# Patient Record
Sex: Female | Born: 1940 | Race: Black or African American | Hispanic: No | Smoking: Former smoker
Health system: Southern US, Community
[De-identification: ages and names within clinical notes are randomized; demographics above are authoritative.]

## PROBLEM LIST (undated history)

## (undated) DIAGNOSIS — I639 Cerebral infarction, unspecified: Secondary | ICD-10-CM

## (undated) DIAGNOSIS — I10 Essential (primary) hypertension: Secondary | ICD-10-CM

## (undated) DIAGNOSIS — R0600 Dyspnea, unspecified: Secondary | ICD-10-CM

## (undated) DIAGNOSIS — T8859XA Other complications of anesthesia, initial encounter: Secondary | ICD-10-CM

## (undated) DIAGNOSIS — J45909 Unspecified asthma, uncomplicated: Secondary | ICD-10-CM

## (undated) HISTORY — PX: BREAST SURGERY: SHX581

---

## 1999-10-11 ENCOUNTER — Encounter: Payer: Self-pay | Admitting: Emergency Medicine

## 1999-10-11 ENCOUNTER — Emergency Department (HOSPITAL_COMMUNITY): Admission: EM | Admit: 1999-10-11 | Discharge: 1999-10-11 | Payer: Self-pay | Admitting: Emergency Medicine

## 2000-03-03 ENCOUNTER — Other Ambulatory Visit: Admission: RE | Admit: 2000-03-03 | Discharge: 2000-03-03 | Payer: Self-pay | Admitting: Radiology

## 2000-06-16 ENCOUNTER — Emergency Department (HOSPITAL_COMMUNITY): Admission: EM | Admit: 2000-06-16 | Discharge: 2000-06-16 | Payer: Self-pay | Admitting: Emergency Medicine

## 2000-06-16 ENCOUNTER — Encounter: Payer: Self-pay | Admitting: Emergency Medicine

## 2002-02-18 ENCOUNTER — Encounter: Admission: RE | Admit: 2002-02-18 | Discharge: 2002-02-18 | Payer: Self-pay | Admitting: Internal Medicine

## 2002-02-21 ENCOUNTER — Encounter: Admission: RE | Admit: 2002-02-21 | Discharge: 2002-02-21 | Payer: Self-pay | Admitting: Internal Medicine

## 2002-03-25 ENCOUNTER — Encounter: Payer: Self-pay | Admitting: Internal Medicine

## 2002-03-25 ENCOUNTER — Ambulatory Visit (HOSPITAL_COMMUNITY): Admission: RE | Admit: 2002-03-25 | Discharge: 2002-03-25 | Payer: Self-pay | Admitting: Internal Medicine

## 2004-08-01 ENCOUNTER — Emergency Department (HOSPITAL_COMMUNITY): Admission: EM | Admit: 2004-08-01 | Discharge: 2004-08-01 | Payer: Self-pay | Admitting: Family Medicine

## 2006-10-25 ENCOUNTER — Emergency Department (HOSPITAL_COMMUNITY): Admission: EM | Admit: 2006-10-25 | Discharge: 2006-10-25 | Payer: Self-pay | Admitting: Emergency Medicine

## 2011-04-04 ENCOUNTER — Other Ambulatory Visit: Payer: Self-pay | Admitting: Family Medicine

## 2011-04-04 ENCOUNTER — Ambulatory Visit
Admission: RE | Admit: 2011-04-04 | Discharge: 2011-04-04 | Disposition: A | Discharge status: Home / Self Care | Payer: Medicare HMO | Source: Ambulatory Visit | Attending: Family Medicine | Admitting: Family Medicine

## 2011-04-04 DIAGNOSIS — R531 Weakness: Secondary | ICD-10-CM

## 2011-04-04 DIAGNOSIS — R200 Anesthesia of skin: Secondary | ICD-10-CM

## 2011-06-05 ENCOUNTER — Other Ambulatory Visit: Payer: Self-pay | Admitting: Family Medicine

## 2011-06-05 DIAGNOSIS — M25511 Pain in right shoulder: Secondary | ICD-10-CM

## 2011-06-11 ENCOUNTER — Ambulatory Visit
Admission: RE | Admit: 2011-06-11 | Discharge: 2011-06-11 | Disposition: A | Discharge status: Home / Self Care | Payer: Medicare HMO | Source: Ambulatory Visit | Attending: Family Medicine | Admitting: Family Medicine

## 2011-06-11 DIAGNOSIS — M25511 Pain in right shoulder: Secondary | ICD-10-CM

## 2012-08-10 ENCOUNTER — Emergency Department (HOSPITAL_COMMUNITY): Payer: Medicare HMO

## 2012-08-10 ENCOUNTER — Observation Stay (HOSPITAL_COMMUNITY)
Admission: EM | Admit: 2012-08-10 | Discharge: 2012-08-11 | Disposition: A | Discharge status: Home / Self Care | Payer: Medicare HMO | Attending: Family Medicine | Admitting: Family Medicine

## 2012-08-10 ENCOUNTER — Encounter (HOSPITAL_COMMUNITY): Payer: Self-pay | Admitting: *Deleted

## 2012-08-10 DIAGNOSIS — E876 Hypokalemia: Secondary | ICD-10-CM | POA: Insufficient documentation

## 2012-08-10 DIAGNOSIS — I714 Abdominal aortic aneurysm, without rupture, unspecified: Secondary | ICD-10-CM

## 2012-08-10 DIAGNOSIS — R079 Chest pain, unspecified: Principal | ICD-10-CM

## 2012-08-10 DIAGNOSIS — I517 Cardiomegaly: Secondary | ICD-10-CM | POA: Insufficient documentation

## 2012-08-10 DIAGNOSIS — R05 Cough: Secondary | ICD-10-CM | POA: Insufficient documentation

## 2012-08-10 DIAGNOSIS — R0602 Shortness of breath: Secondary | ICD-10-CM | POA: Insufficient documentation

## 2012-08-10 DIAGNOSIS — Z8673 Personal history of transient ischemic attack (TIA), and cerebral infarction without residual deficits: Secondary | ICD-10-CM | POA: Insufficient documentation

## 2012-08-10 DIAGNOSIS — F411 Generalized anxiety disorder: Secondary | ICD-10-CM

## 2012-08-10 DIAGNOSIS — Z23 Encounter for immunization: Secondary | ICD-10-CM | POA: Insufficient documentation

## 2012-08-10 DIAGNOSIS — K219 Gastro-esophageal reflux disease without esophagitis: Secondary | ICD-10-CM

## 2012-08-10 DIAGNOSIS — I10 Essential (primary) hypertension: Secondary | ICD-10-CM | POA: Insufficient documentation

## 2012-08-10 DIAGNOSIS — R059 Cough, unspecified: Secondary | ICD-10-CM | POA: Insufficient documentation

## 2012-08-10 DIAGNOSIS — D509 Iron deficiency anemia, unspecified: Secondary | ICD-10-CM | POA: Insufficient documentation

## 2012-08-10 HISTORY — DX: Essential (primary) hypertension: I10

## 2012-08-10 HISTORY — DX: Cerebral infarction, unspecified: I63.9

## 2012-08-10 LAB — CBC WITH DIFFERENTIAL/PLATELET
Basophils Relative: 1 % (ref 0–1)
Eosinophils Absolute: 0.2 10*3/uL (ref 0.0–0.7)
HCT: 32.7 % — ABNORMAL LOW (ref 36.0–46.0)
Hemoglobin: 10.8 g/dL — ABNORMAL LOW (ref 12.0–15.0)
MCH: 25.7 pg — ABNORMAL LOW (ref 26.0–34.0)
MCHC: 33 g/dL (ref 30.0–36.0)
Monocytes Absolute: 0.5 10*3/uL (ref 0.1–1.0)
Monocytes Relative: 6 % (ref 3–12)
Neutro Abs: 4.4 10*3/uL (ref 1.7–7.7)

## 2012-08-10 LAB — COMPREHENSIVE METABOLIC PANEL
Albumin: 3.6 g/dL (ref 3.5–5.2)
BUN: 19 mg/dL (ref 6–23)
Chloride: 101 mEq/L (ref 96–112)
Creatinine, Ser: 0.92 mg/dL (ref 0.50–1.10)
GFR calc Af Amer: 70 mL/min — ABNORMAL LOW (ref >90)
GFR calc non Af Amer: 60 mL/min — ABNORMAL LOW (ref >90)
Glucose, Bld: 99 mg/dL (ref 70–99)
Total Bilirubin: 0.3 mg/dL (ref 0.3–1.2)

## 2012-08-10 LAB — TROPONIN I: Troponin I: <0.3 ng/mL (ref <0.30)

## 2012-08-10 LAB — PRO B NATRIURETIC PEPTIDE: Pro B Natriuretic peptide (BNP): 29.4 pg/mL (ref 0–125)

## 2012-08-10 MED ORDER — NITROGLYCERIN 0.4 MG SL SUBL
0.4000 mg | SUBLINGUAL_TABLET | Freq: Once | SUBLINGUAL | Status: AC
  Administered 2012-08-10: 0.4 mg via SUBLINGUAL
  Filled 2012-08-10: qty 25

## 2012-08-10 MED ORDER — ASPIRIN 81 MG PO CHEW
324.0000 mg | CHEWABLE_TABLET | Freq: Once | ORAL | Status: AC
  Administered 2012-08-10: 324 mg via ORAL
  Filled 2012-08-10: qty 4

## 2012-08-10 MED ORDER — SODIUM CHLORIDE 0.9 % IV SOLN
INTRAVENOUS | Status: AC
  Administered 2012-08-11: via INTRAVENOUS

## 2012-08-10 NOTE — ED Notes (Signed)
Pt states that she began to feel short of breath around 4pm and that is has gotten progressively worse since then; pt denies chest pain; pt states that she feels like she is not getting enough air.

## 2012-08-10 NOTE — ED Provider Notes (Signed)
History     CSN: [id number]  Arrival date & time 08/10/12  2110   First MD Initiated Contact with Patient 08/10/12 2148      Chief Complaint  Patient presents with  . Shortness of Breath    (Consider location/radiation/quality/duration/timing/severity/associated sxs/prior treatment) HPI Comments: Patient comes to the ER for evaluation of chest discomfort and shortness of breath. Symptoms began this afternoon. Patient was at rest when symptoms began. She describes a burning sensation in the Center of her chest to radiate up towards the throat. She has felt short of breath with it. She reports that she did not take any medications. Was present for a couple of hours when it started to get worse and then she called her daughter. Was brought to the ER for further evaluation. At time of arrival symptoms have significantly improved although she has slight discomfort in the center of the chest. She has never had a heart disease but does have a history of stroke. There is family history of heart disease as well.  Patient is a 72 y.o. female presenting with shortness of breath.  Shortness of Breath Associated symptoms: chest pain     Past Medical History  Diagnosis Date  . Stroke   . Hypertension     History reviewed. No pertinent past surgical history.  No family history on file.  History  Substance Use Topics  . Smoking status: Former Games developer  . Smokeless tobacco: Not on file  . Alcohol Use: No    OB History   Grav Para Term Preterm Abortions TAB SAB Ect Mult Living                  Review of Systems  Respiratory: Positive for shortness of breath.   Cardiovascular: Positive for chest pain.  All other systems reviewed and are negative.    Allergies  Review of patient's allergies indicates no known allergies.  Home Medications  No current outpatient prescriptions on file.  BP 119/74  Pulse 87  Temp(Src) 98.7 F (37.1 C) (Oral)  Resp 18  Ht 5' (1.524 m)  Wt 121 lb  (54.885 kg)  BMI 23.63 kg/m2  SpO2 99%  Physical Exam  Constitutional: She is oriented to person, place, and time. She appears well-developed and well-nourished. No distress.  HENT:  Head: Normocephalic and atraumatic.  Right Ear: Hearing normal.  Nose: Nose normal.  Mouth/Throat: Oropharynx is clear and moist and mucous membranes are normal.  Eyes: Conjunctivae and EOM are normal. Pupils are equal, round, and reactive to light.  Neck: Normal range of motion. Neck supple.  Cardiovascular: Normal rate, regular rhythm, S1 normal and S2 normal.  Exam reveals no gallop and no friction rub.   No murmur heard. Pulmonary/Chest: Effort normal and breath sounds normal. No respiratory distress. She exhibits no tenderness.  Abdominal: Soft. Normal appearance and bowel sounds are normal. There is no hepatosplenomegaly. There is no tenderness. There is no rebound, no guarding, no tenderness at McBurney's point and negative Murphy's sign. No hernia.  Musculoskeletal: Normal range of motion.  Neurological: She is alert and oriented to person, place, and time. She has normal strength. No cranial nerve deficit or sensory deficit. Coordination normal. GCS eye subscore is 4. GCS verbal subscore is 5. GCS motor subscore is 6.  Skin: Skin is warm, dry and intact. No rash noted. No cyanosis.  Psychiatric: She has a normal mood and affect. Her speech is normal and behavior is normal. Thought content normal.  ED Course  Procedures (including critical care time)  EKG:  Date: 08/10/2012  Rate: 78  Rhythm: normal sinus rhythm  QRS Axis: normal  Intervals: normal  ST/T Wave abnormalities: normal  Conduction Disutrbances: none  Narrative Interpretation: unremarkable      Labs Reviewed  CBC WITH DIFFERENTIAL - Abnormal; Notable for the following:    Hemoglobin 10.8 (*)    HCT 32.7 (*)    MCV 77.9 (*)    MCH 25.7 (*)    RDW 15.7 (*)    All other components within normal limits  COMPREHENSIVE  METABOLIC PANEL - Abnormal; Notable for the following:    Potassium 3.0 (*)    GFR calc non Af Amer 60 (*)    GFR calc Af Amer 70 (*)    All other components within normal limits  TROPONIN I  PRO B NATRIURETIC PEPTIDE   Dg Chest 2 View  08/10/2012  *RADIOLOGY REPORT*  Clinical Data: Chest pain.  Cough and shortness of breath for 1 week.  CHEST - 2 VIEW  Comparison: None.  Findings: Lateral view degraded by patient arm position.  Midline trachea.  Mild cardiomegaly.  Borderline right paratracheal soft tissue fullness is favored to be due to great vessels.  No pleural effusion or pneumothorax.  No congestive failure.  Clear lungs.  IMPRESSION: Cardiomegaly, without acute disease.   Original Report Authenticated By: Jeronimo Greaves, M.D.      Diagnosis: Chest pain    MDM  This presents to the ER for evaluation of chest pain and shortness of breath. Symptoms began earlier today while at rest. She did not have any exertional pain. She thought it was indigestion at first. At arrival symptoms were improving but still present. Symptoms completely resolved after aspirin and one nitroglycerin sublingual. Patient does have a personal history of vascular disease (CVA) and a family history of heart disease. Her EKG was unremarkable and troponin is negative. Patient will require further cardiac rule out. To be placed on telemetry observation the hospitalist.       Gilda Crease, MD 08/10/12 2340

## 2012-08-10 NOTE — ED Notes (Signed)
Pt O2 sat on RA is 95% but pt reports breathing better with O2 on.  Pt put on 1L [place].

## 2012-08-10 NOTE — ED Notes (Addendum)
Pt reports feeling "lighter" and better.

## 2012-08-11 ENCOUNTER — Encounter (HOSPITAL_COMMUNITY): Payer: Self-pay | Admitting: *Deleted

## 2012-08-11 DIAGNOSIS — F411 Generalized anxiety disorder: Secondary | ICD-10-CM

## 2012-08-11 DIAGNOSIS — K219 Gastro-esophageal reflux disease without esophagitis: Secondary | ICD-10-CM

## 2012-08-11 DIAGNOSIS — R079 Chest pain, unspecified: Secondary | ICD-10-CM

## 2012-08-11 LAB — BASIC METABOLIC PANEL
BUN: 19 mg/dL (ref 6–23)
BUN: 15 mg/dL (ref 6–23)
CO2: 26 mEq/L (ref 19–32)
Chloride: 102 mEq/L (ref 96–112)
Creatinine, Ser: 0.87 mg/dL (ref 0.50–1.10)
GFR calc non Af Amer: 63 mL/min — ABNORMAL LOW (ref >90)
Glucose, Bld: 88 mg/dL (ref 70–99)
Potassium: 3.1 mEq/L — ABNORMAL LOW (ref 3.5–5.1)

## 2012-08-11 LAB — LIPID PANEL
Cholesterol: 201 mg/dL — ABNORMAL HIGH (ref 0–200)
Total CHOL/HDL Ratio: 4.7 RATIO
Triglycerides: 129 mg/dL (ref <150)
VLDL: 26 mg/dL (ref 0–40)

## 2012-08-11 LAB — HEMOGLOBIN A1C: Mean Plasma Glucose: 128 mg/dL — ABNORMAL HIGH (ref <117)

## 2012-08-11 MED ORDER — POTASSIUM CHLORIDE CRYS ER 20 MEQ PO TBCR
40.0000 meq | EXTENDED_RELEASE_TABLET | Freq: Four times a day (QID) | ORAL | Status: AC
Start: 2012-08-11 — End: 2012-08-11
  Administered 2012-08-11 (×3): 40 meq via ORAL
  Filled 2012-08-11 (×3): qty 2

## 2012-08-11 MED ORDER — PANTOPRAZOLE SODIUM 40 MG PO TBEC
40.0000 mg | DELAYED_RELEASE_TABLET | Freq: Every day | ORAL | Status: DC
  Administered 2012-08-11: 40 mg via ORAL
  Filled 2012-08-11: qty 1

## 2012-08-11 MED ORDER — SODIUM CHLORIDE 0.9 % IV SOLN: 250.0000 mL | INTRAVENOUS | Status: DC | PRN

## 2012-08-11 MED ORDER — HYDROCODONE-ACETAMINOPHEN 5-325 MG PO TABS
1.0000 | ORAL_TABLET | Freq: Four times a day (QID) | ORAL | Status: DC | PRN
  Administered 2012-08-11: 1 via ORAL
  Filled 2012-08-11: qty 1

## 2012-08-11 MED ORDER — ENOXAPARIN SODIUM 40 MG/0.4ML ~~LOC~~ SOLN
40.0000 mg | SUBCUTANEOUS | Status: DC
  Administered 2012-08-11: 40 mg via SUBCUTANEOUS
  Filled 2012-08-11: qty 0.4

## 2012-08-11 MED ORDER — GUAIFENESIN-DM 100-10 MG/5ML PO SYRP
5.0000 mL | ORAL_SOLUTION | ORAL | Status: DC | PRN
  Administered 2012-08-11: 5 mL via ORAL
  Filled 2012-08-11: qty 10

## 2012-08-11 MED ORDER — SODIUM CHLORIDE 0.9 % IJ SOLN: 3.0000 mL | Freq: Two times a day (BID) | INTRAMUSCULAR | Status: DC

## 2012-08-11 MED ORDER — ONDANSETRON HCL 4 MG PO TABS: 4.0000 mg | ORAL_TABLET | Freq: Four times a day (QID) | ORAL | Status: DC | PRN

## 2012-08-11 MED ORDER — ALUM & MAG HYDROXIDE-SIMETH 200-200-20 MG/5ML PO SUSP
30.0000 mL | Freq: Four times a day (QID) | ORAL | Status: DC | PRN
  Administered 2012-08-11: 30 mL via ORAL
  Filled 2012-08-11: qty 30

## 2012-08-11 MED ORDER — ONDANSETRON HCL 4 MG/2ML IJ SOLN: 4.0000 mg | Freq: Four times a day (QID) | INTRAMUSCULAR | Status: DC | PRN

## 2012-08-11 MED ORDER — MORPHINE SULFATE 2 MG/ML IJ SOLN: 2.0000 mg | INTRAMUSCULAR | Status: DC | PRN

## 2012-08-11 MED ORDER — PNEUMOCOCCAL VAC POLYVALENT 25 MCG/0.5ML IJ INJ
0.5000 mL | INJECTION | Freq: Once | INTRAMUSCULAR | Status: AC
  Administered 2012-08-11: 0.5 mL via INTRAMUSCULAR
  Filled 2012-08-11: qty 0.5

## 2012-08-11 MED ORDER — ASPIRIN EC 81 MG PO TBEC
81.0000 mg | DELAYED_RELEASE_TABLET | Freq: Every day | ORAL | Status: DC
  Administered 2012-08-11: 81 mg via ORAL
  Filled 2012-08-11: qty 1

## 2012-08-11 MED ORDER — ASPIRIN 81 MG PO TBEC: 81.0000 mg | DELAYED_RELEASE_TABLET | Freq: Every day | ORAL | Status: DC

## 2012-08-11 MED ORDER — PANTOPRAZOLE SODIUM 40 MG PO TBEC: 40.0000 mg | DELAYED_RELEASE_TABLET | Freq: Every day | ORAL | Status: DC

## 2012-08-11 MED ORDER — ALPRAZOLAM 0.25 MG PO TABS: 0.2500 mg | ORAL_TABLET | Freq: Three times a day (TID) | ORAL | Status: DC | PRN

## 2012-08-11 MED ORDER — ALBUTEROL SULFATE (5 MG/ML) 0.5% IN NEBU
2.5000 mg | INHALATION_SOLUTION | RESPIRATORY_TRACT | Status: DC | PRN
  Administered 2012-08-11: 2.5 mg via RESPIRATORY_TRACT
  Filled 2012-08-11: qty 0.5

## 2012-08-11 MED ORDER — ALPRAZOLAM 0.25 MG PO TABS
0.2500 mg | ORAL_TABLET | Freq: Three times a day (TID) | ORAL | Status: DC | PRN
  Administered 2012-08-11: 0.25 mg via ORAL
  Filled 2012-08-11: qty 1

## 2012-08-11 MED ORDER — SODIUM CHLORIDE 0.9 % IJ SOLN: 3.0000 mL | INTRAMUSCULAR | Status: DC | PRN

## 2012-08-11 NOTE — Discharge Summary (Addendum)
Physician Discharge Summary  Infant Doane Kassing UEA:[id number] DOB: November 15, 1940 DOA: 08/10/2012  PCP: No primary provider on file.  Admit date: 08/10/2012 Discharge date: 08/11/2012  Time spent: 50 minutes  Recommendations for Outpatient Follow-up:  1. [facility] cardiology will call you to set up stress test as outpatient  Discharge Diagnoses:  Chest pain GERD Anxiety  Disorder  Discharge Condition: Stable  Diet recommendation: low salt diet  Filed Weights   08/10/12 2121 08/11/12 0117  Weight: 54.885 kg (121 lb) 58 kg (127 lb 13.9 oz)    History of present illness:  72 y.o. female with PMHx significant for HTN, stroke presents to [facility] for evaluation of acute onset chest pain/shortness of breath that began on the day of admission while the patient was resting at about 5 PM. Pain is described as a burning chest pain in the center of the chest , radiating up towards throat. The pain is rated as a 3/10 in severity at onset and currently rated 0/10. The pain is notably aggravated by coughing. It is alleviated by none. has associated near syncope and shortness of breath. For the pain, the patient tried nothing for pain relief.  Patient is able to walk couple of miles without any difficulty and had actually walked this morning without any aggravation in pain or shortness of breath.      Hospital Course:   Chest pain Resolved Most likely due to GERD or panic attack We have called cardiology for outpatient stress test  GERD Will send her on Po protonix  Anxiety/panic disorder Xanax 0.25 mg po q 8 hr prn Will follow up with PCP to consider SSRI  HTN Continue Hyzaar  Hypokalemia Potassium replaced   Procedures:  None  Consultations:  None  Discharge Exam: Filed Vitals:   08/11/12 0359 08/11/12 0528 08/11/12 1300 08/11/12 1355  BP:  113/63  125/78  Pulse:  69  80  Temp:  98.1 F (36.7 C) 98.1 F (36.7 C) 98.1 F (36.7 C)  TempSrc:  Oral  Oral  Resp:  18  18  Height:       Weight:      SpO2: 96% 97%  97%    General: appear in no acute distress Cardiovascular: S1s2 RRR Respiratory: Clear bilaterally  Discharge Instructions  Discharge Orders   Future Orders Complete By Expires     Diet - low sodium heart healthy  As directed     Increase activity slowly  As directed         Medication List    TAKE these medications       ALPRAZolam 0.25 MG tablet  Commonly known as:  XANAX  Take 1 tablet (0.25 mg total) by mouth 3 (three) times daily as needed for anxiety.     aspirin 81 MG EC tablet  Take 1 tablet (81 mg total) by mouth daily.     loratadine 10 MG tablet  Commonly known as:  CLARITIN  Take 10 mg by mouth daily.     losartan-hydrochlorothiazide 100-25 MG per tablet  Commonly known as:  HYZAAR  Take 1 tablet by mouth daily.     pantoprazole 40 MG tablet  Commonly known as:  PROTONIX  Take 1 tablet (40 mg total) by mouth daily.          The results of significant diagnostics from this hospitalization (including imaging, microbiology, ancillary and laboratory) are listed below for reference.    Significant Diagnostic Studies: Dg Chest 2 View  08/10/2012  *RADIOLOGY  REPORT*  Clinical Data: Chest pain.  Cough and shortness of breath for 1 week.  CHEST - 2 VIEW  Comparison: None.  Findings: Lateral view degraded by patient arm position.  Midline trachea.  Mild cardiomegaly.  Borderline right paratracheal soft tissue fullness is favored to be due to great vessels.  No pleural effusion or pneumothorax.  No congestive failure.  Clear lungs.  IMPRESSION: Cardiomegaly, without acute disease.   Original Report Authenticated By: Jeronimo Greaves, M.D.     Microbiology: No results found for this or any previous visit (from the past 240 hour(s)).   Labs: Basic Metabolic Panel:  Recent Labs Lab 08/10/12 2216 08/11/12 0307 08/11/12 1328  NA 138 138 137  K 3.0* 3.1* 4.0  CL 101 100 102  CO2 27 28 26   GLUCOSE 99 88 109*  BUN 19 19 15    CREATININE 0.92 0.89 0.87  CALCIUM 9.7 9.1 9.3  MG  --  2.1  --    Liver Function Tests:  Recent Labs Lab 08/10/12 2216  AST 18  ALT 19  ALKPHOS 79  BILITOT 0.3  PROT 7.6  ALBUMIN 3.6   No results found for this basename: LIPASE, AMYLASE,  in the last 168 hours No results found for this basename: AMMONIA,  in the last 168 hours CBC:  Recent Labs Lab 08/10/12 2216  WBC 8.3  NEUTROABS 4.4  HGB 10.8*  HCT 32.7*  MCV 77.9*  PLT 314   Cardiac Enzymes:  Recent Labs Lab 08/10/12 2216 08/11/12 0307 08/11/12 0855  TROPONINI <0.30 <0.30 <0.30   BNP: BNP (last 3 results)  Recent Labs  08/10/12 2216  PROBNP 29.4   CBG: No results found for this basename: GLUCAP,  in the last 168 hours     Signed:  Milagro Belmares S  [facility] 08/11/2012, 3:25 PM

## 2012-08-11 NOTE — Care Management Note (Addendum)
    Page 1 of 1   08/11/2012     3:53:43 PM   CARE MANAGEMENT NOTE 08/11/2012  Patient:  Nicole Rush, Nicole Rush   Account Number:  [id number]  Date Initiated:  08/11/2012  Documentation initiated by:  Lanier Clam  Subjective/Objective Assessment:   ADMITTED W/CHEST PAIN.HX:HTN.     Action/Plan:   FROM HOME W/SUPPORT.HAS PCP,PHARMACY.   Anticipated DC Date:  08/11/2012   Anticipated DC Plan:  HOME/SELF CARE      DC Planning Services  CM consult      Choice offered to / List presented to:             Status of service:  Completed, signed off Medicare Important Message given?   (If response is "NO", the following Medicare IM given date fields will be blank) Date Medicare IM given:   Date Additional Medicare IM given:    Discharge Disposition:  HOME/SELF CARE  Per UR Regulation:  Reviewed for med. necessity/level of care/duration of stay  If discussed at Long Length of Stay Meetings, dates discussed:    Comments:  08/11/12 Mariadelcarmen Corella RN,BSN NCM [phone] D/C HOME NO ORDERS OR NEEDS.

## 2012-08-11 NOTE — ED Notes (Signed)
Dr. Garg at bedside. 

## 2012-08-11 NOTE — H&P (Signed)
[facility]  Date: 08/11/2012               Patient Name:  Nicole Rush MRN: [id number]  DOB: 02-06-1941 Age / Sex: 72 y.o., female   PCP: No primary provider on file.              Chief Complaint: chest pain   History of Present Illness: Patient is a 72 y.o. female with PMHx significant for HTN, stroke presents to [facility] for evaluation of acute onset chest pain/shortness of breath that began on the day of admission while the patient was resting at about 5 PM. Pain is described as a burning chest pain in the center of the chest , radiating up towards throat. The pain is rated as a 3/10 in severity at onset and currently rated 0/10. The pain is notably aggravated by coughing. It is alleviated by none. has associated near syncope and shortness of breath. For the pain, the patient tried nothing for pain relief.  Patient is able to walk couple of miles without any difficulty and had actually walked this morning without any aggravation in pain or shortness of breath.   The patient has not experienced similar symptoms previously. Secondary to her constellation of symptoms, Nicole Rush presented to [facility] for further evaluation. Otherwise, the patient denies diaphoresis, nausea, near syncope and palpitations.   Current Outpatient Medications:  (Not in a hospital admission)  Allergies: No Known Allergies   Past Medical History: Past Medical History  Diagnosis Date  . Stroke   . Hypertension     Past Surgical History: History reviewed. No pertinent past surgical history.  Family History: No family history on file.  Social History: History   Social History  . Marital Status: Widowed    Spouse Name: N/A    Number of Children: N/A  . Years of Education: N/A   Occupational History  . Not on file.   Social History Main Topics  . Smoking status: Former Games developer  . Smokeless tobacco: Not on file  . Alcohol Use: No  . Drug Use: No  . Sexually Active: No   Other  Topics Concern  . Not on file   Social History Narrative  . No narrative on file    Review of Systems: Constitutional:  denies fever, chills, diaphoresis, appetite change and fatigue.  HEENT: denies photophobia, eye pain, redness, hearing loss, ear pain, congestion, sore throat, rhinorrhea, sneezing, neck pain, neck stiffness and tinnitus.  Respiratory: denies SOB, DOE, cough, chest tightness, and wheezing.  Cardiovascular: admits to chest pain,SOB. Denies palpitations and leg swelling.  Gastrointestinal: denies nausea, vomiting, abdominal pain, diarrhea, constipation, blood in stool.  Genitourinary: denies dysuria, urgency, frequency, hematuria, flank pain and difficulty urinating.  Musculoskeletal: denies  myalgias, back pain, joint swelling, arthralgias and gait problem.   Skin: denies pallor, rash and wound.  Neurological: denies dizziness, seizures, syncope, weakness, light-headedness, numbness and headaches.   Hematological: denies adenopathy, easy bruising, personal or family bleeding history.  Psychiatric/ Behavioral: denies suicidal ideation, mood changes, confusion, nervousness, sleep disturbance and agitation.    Vital Signs: Blood pressure 126/74, pulse 74, temperature 98.7 F (37.1 C), temperature source Oral, resp. rate 34, height 5' (1.524 m), weight 121 lb (54.885 kg), SpO2 100.00%.  Physical Exam: General: Vital signs reviewed and noted. Well-developed, well-nourished, in no acute distress; alert, appropriate and cooperative throughout examination.  Head: Normocephalic, atraumatic.  Eyes: PERRL, EOMI, No signs of anemia or jaundince.  Nose: Mucous membranes  moist, not inflammed, nonerythematous.  Throat: Oropharynx nonerythematous, no exudate appreciated.   Neck: No deformities, masses, or tenderness noted.Supple, No carotid Bruits, no JVD.  Lungs:  Normal respiratory effort. Clear to auscultation BL without crackles or wheezes.  Heart: RRR. S1 and S2 normal without  gallop, murmur, or rubs.  Abdomen:  BS normoactive. Soft, Nondistended, non-tender.  No masses or organomegaly.  Extremities: No pretibial edema.  Neurologic: A&O X3, CN II - XII are grossly intact. Motor strength is 5/5 in the all 4 extremities, Sensations intact to light touch, Cerebellar signs negative.  Skin: No visible rashes, scars.    Lab results: Basic Metabolic Panel:  Recent Labs  21/30/86 2216  NA 138  K 3.0*  CL 101  CO2 27  GLUCOSE 99  BUN 19  CREATININE 0.92  CALCIUM 9.7   Liver Function Tests:  Recent Labs  08/10/12 2216  AST 18  ALT 19  ALKPHOS 79  BILITOT 0.3  PROT 7.6  ALBUMIN 3.6     Recent Labs  08/10/12 2216  WBC 8.3  NEUTROABS 4.4  HGB 10.8*  HCT 32.7*  MCV 77.9*  PLT 314   Cardiac Enzymes:  Recent Labs  08/10/12 2216  TROPONINI <0.30   BNP:  Recent Labs  08/10/12 2216  PROBNP 29.4      Imaging results:  Dg Chest 2 View  08/10/2012  *RADIOLOGY REPORT*  Clinical Data: Chest pain.  Cough and shortness of breath for 1 week.  CHEST - 2 VIEW  Comparison: None.  Findings: Lateral view degraded by patient arm position.  Midline trachea.  Mild cardiomegaly.  Borderline right paratracheal soft tissue fullness is favored to be due to great vessels.  No pleural effusion or pneumothorax.  No congestive failure.  Clear lungs.  IMPRESSION: Cardiomegaly, without acute disease.   Original Report Authenticated By: Jeronimo Greaves, M.D.       Other results:  EKG (08/11/2012) - Normal Sinus Rhythm , regular rate of approximately 78 bpm, normal axis, ST segments: normal and nonspecific ST changes.    Assessment & Plan: Pt is a 72 y.o. yo female with a PMHX of HTN and stroke , who presented to [facility] on 08/10/2012 with symptoms of chest pain  # Atypical Chest pain - Patient does not have known history of coronary artery disease. She also has cardiac risk factors including the following: hypertension. Admission EKG showed non specific ST changes,  and cardiac enzymes have been negative . Her chest pain is mostly likely related to GERD but given her age and risk factors including HTN, Stroke, would admit her to rule out ACS. Chest xray is negative for any acute pathology  Admit to telemetry  Continue to cycle cardiac enzymes.  Morphine, oxygen and PRN nitroglycerine  Check HgA1c, FLP for risk factor stratification.   Protonix and Maalox for indigestion   Patient will likely need a stress test to rule out CAD and assess exercise tolerance for prognosis.  # Hypokalemia: Check Mg. Replete  # Microcytic anemia: H and H stable.     DVT PPX - low molecular weight heparin  CODE STATUS - Full  CONSULTS PLACED - None  COMMUNICATION: Patient and family updated on bedside.      Signed: Lars Mage, MD  08/11/2012, 12:12 AM

## 2012-08-11 NOTE — Progress Notes (Signed)
Patient discharged home with daughter, discharge instructions given and explained to patient/daughter and they verbalized understanding, denies any pain/distress. Skin intact, no wound. Patient accompanied home by daughter.

## 2013-03-13 ENCOUNTER — Encounter (HOSPITAL_COMMUNITY): Payer: Self-pay | Admitting: Emergency Medicine

## 2013-03-13 ENCOUNTER — Emergency Department (HOSPITAL_COMMUNITY)
Admission: EM | Admit: 2013-03-13 | Discharge: 2013-03-13 | Disposition: A | Discharge status: Home / Self Care | Payer: Medicare HMO | Attending: Emergency Medicine | Admitting: Emergency Medicine

## 2013-03-13 ENCOUNTER — Emergency Department (HOSPITAL_COMMUNITY): Payer: Medicare HMO

## 2013-03-13 DIAGNOSIS — S76311A Strain of muscle, fascia and tendon of the posterior muscle group at thigh level, right thigh, initial encounter: Secondary | ICD-10-CM

## 2013-03-13 DIAGNOSIS — I10 Essential (primary) hypertension: Secondary | ICD-10-CM | POA: Insufficient documentation

## 2013-03-13 DIAGNOSIS — Y929 Unspecified place or not applicable: Secondary | ICD-10-CM | POA: Insufficient documentation

## 2013-03-13 DIAGNOSIS — Z79899 Other long term (current) drug therapy: Secondary | ICD-10-CM | POA: Insufficient documentation

## 2013-03-13 DIAGNOSIS — IMO0002 Reserved for concepts with insufficient information to code with codable children: Secondary | ICD-10-CM | POA: Insufficient documentation

## 2013-03-13 DIAGNOSIS — Z87891 Personal history of nicotine dependence: Secondary | ICD-10-CM | POA: Insufficient documentation

## 2013-03-13 DIAGNOSIS — X58XXXA Exposure to other specified factors, initial encounter: Secondary | ICD-10-CM | POA: Insufficient documentation

## 2013-03-13 DIAGNOSIS — M79604 Pain in right leg: Secondary | ICD-10-CM

## 2013-03-13 DIAGNOSIS — M79609 Pain in unspecified limb: Secondary | ICD-10-CM

## 2013-03-13 DIAGNOSIS — J45909 Unspecified asthma, uncomplicated: Secondary | ICD-10-CM | POA: Insufficient documentation

## 2013-03-13 DIAGNOSIS — Y9389 Activity, other specified: Secondary | ICD-10-CM | POA: Insufficient documentation

## 2013-03-13 HISTORY — DX: Unspecified asthma, uncomplicated: J45.909

## 2013-03-13 MED ORDER — ACETAMINOPHEN 325 MG PO TABS
650.0000 mg | ORAL_TABLET | Freq: Once | ORAL | Status: AC
Start: 2013-03-13 — End: 2013-03-13
  Administered 2013-03-13: 650 mg via ORAL
  Filled 2013-03-13: qty 2

## 2013-03-13 NOTE — Progress Notes (Signed)
VASCULAR LAB PRELIMINARY  PRELIMINARY  PRELIMINARY  PRELIMINARY  Right lower extremity venous duplex completed.    Preliminary report:  Right:  No evidence of DVT, superficial thrombosis, or Baker's cyst.  Nashika Coker, RVT 03/13/2013, 3:59 PM

## 2013-03-13 NOTE — ED Provider Notes (Signed)
See prior note   Ward Givens, MD 03/13/13 [phone]

## 2013-03-13 NOTE — ED Provider Notes (Signed)
Patient reports she walks a large pitbull that tracks her around. She does not remember a specific injury however a month ago started having pain in her right leg that is in her thigh down to her ankle. She states the worst pain however is centered behind her right knee and indicates an area about 6 inches above the knee and 6 inches below the knee. She states that she quit walking the golf 2 weeks ago however she continues to have discomfort. She states when she walks her right knee will give out on her. She has not had any fall or injury from that. She states she's never seen an orthopedist before.  Patient has good range of motion of her right hip, knee, and ankle. There is no obvious swelling or deformity. She has some mild diffuse tenderness over the hamstring area and also the medial thigh.  Medical screening examination/treatment/procedure(s) were conducted as a shared visit with non-physician practitioner(s) and myself.  I personally evaluated the patient during the encounter.  EKG Interpretation   None       Devoria Albe, MD, Armando Gang   Ward Givens, MD 03/13/13 [phone]

## 2013-03-13 NOTE — ED Notes (Signed)
She states that for the past month, her right foot and inner ankle have "been 'burning' a little bit".  She also tells me she walks "a lot every day to stay in shape".  She came here today because she states that for the past couple of days, her entire right leg has "started to hurt".  She denies any trauma, and is in no distress.

## 2013-03-13 NOTE — ED Provider Notes (Signed)
CSN: [id number]     Arrival date & time 03/13/13  1433 History   First MD Initiated Contact with Patient 03/13/13 1503     Chief Complaint  Patient presents with  . Leg Pain   (Consider location/radiation/quality/duration/timing/severity/associated sxs/prior Treatment) HPI Comments: Patient is a 72 year old female with a past medical history of stroke, hypertension and asthma who presents to the emergency department with her daughter complaining of right leg pain "for a while" worsening over the past 2 days. Pain begins in her lower leg, radiates up behind her knee and into her thigh to her hip. Denies back pain or injury. No known injury or trauma. Yesterday she felt a "pop" in her knee. Pain worse with walking, knee occasionally "gives out", minimally relieved by Advil. Denies swelling or color change. She has been walking a pitbull that ahs been pulling her around.  Patient is a 72 y.o. female presenting with leg pain. The history is provided by the patient and a relative.  Leg Pain   Past Medical History  Diagnosis Date  . Stroke   . Hypertension   . Asthma    History reviewed. No pertinent past surgical history. History reviewed. No pertinent family history. History  Substance Use Topics  . Smoking status: Former Games developer  . Smokeless tobacco: Not on file  . Alcohol Use: No   OB History   Grav Para Term Preterm Abortions TAB SAB Ect Mult Living                 Review of Systems  Musculoskeletal:       Positive for right leg pain.    Allergies  Review of patient's allergies indicates no known allergies.  Home Medications   Current Outpatient Rx  Name  Route  Sig  Dispense  Refill  . loratadine (CLARITIN) 10 MG tablet   Oral   Take 10 mg by mouth daily.         Marland Kitchen losartan-hydrochlorothiazide (HYZAAR) 100-25 MG per tablet   Oral   Take 1 tablet by mouth daily.          BP 130/77  Pulse 97  Temp(Src) 97.9 F (36.6 C) (Oral)  Resp 16  SpO2 98% Physical  Exam  Nursing note and vitals reviewed. Constitutional: She is oriented to person, place, and time. She appears well-developed and well-nourished. No distress.  HENT:  Head: Normocephalic and atraumatic.  Mouth/Throat: Oropharynx is clear and moist.  Eyes: Conjunctivae are normal.  Neck: Normal range of motion. Neck supple.  Cardiovascular: Normal rate, regular rhythm, normal heart sounds and intact distal pulses.   No extremity edema.  Pulmonary/Chest: Effort normal and breath sounds normal.  Musculoskeletal: Normal range of motion. She exhibits no edema.  Tender to palpation of the popliteal space on right up into the hamstrings. Full range of motion of ankle, knee and hip. No lumbar spine tenderness.  Neurological: She is alert and oriented to person, place, and time.  Skin: Skin is warm and dry. She is not diaphoretic.  Psychiatric: She has a normal mood and affect. Her behavior is normal.    ED Course  Procedures (including critical care time) Labs Review Labs Reviewed - No data to display Imaging Review No results found.  EKG Interpretation   None       MDM   1. Hamstring strain, right, initial encounter   2. Leg pain, right    Pt with leg pain, no swelling or erythema. Neurovascularly intact. X-ray of right  knee normal. Lower extremity duplex obtained to rule out DVT, negative for DVT. She is well appearing and in no apparent distress, normal vital signs. Probable hamstring pull as she has been out walking a pitbull, tender in that area. Will apply knee immobilizer, crutches. NSAIDs, elevation, f/u with ortho. Case discussed with attending Dr. Lynelle Doctor who also evaluated patient and agrees with plan of care.     Trevor Mace, PA-C 03/13/13 1623

## 2013-03-13 NOTE — ED Notes (Signed)
Right leg painful and numb yesterday with knee popping, today is much better, states still limping, increased pain at night

## 2015-07-09 ENCOUNTER — Ambulatory Visit
Admission: RE | Admit: 2015-07-09 | Discharge: 2015-07-09 | Disposition: A | Discharge status: Home / Self Care | Payer: Medicare HMO | Source: Ambulatory Visit | Attending: Family Medicine | Admitting: Family Medicine

## 2015-07-09 ENCOUNTER — Other Ambulatory Visit: Payer: Self-pay | Admitting: Family Medicine

## 2015-07-09 DIAGNOSIS — M25511 Pain in right shoulder: Secondary | ICD-10-CM

## 2018-05-13 ENCOUNTER — Other Ambulatory Visit: Payer: Self-pay | Admitting: Family Medicine

## 2018-05-13 ENCOUNTER — Ambulatory Visit
Admission: RE | Admit: 2018-05-13 | Discharge: 2018-05-13 | Disposition: A | Discharge status: Home / Self Care | Payer: Medicare HMO | Source: Ambulatory Visit | Attending: Family Medicine | Admitting: Family Medicine

## 2018-05-13 DIAGNOSIS — R52 Pain, unspecified: Secondary | ICD-10-CM

## 2018-10-25 ENCOUNTER — Other Ambulatory Visit: Payer: Self-pay | Admitting: Family Medicine

## 2018-10-25 ENCOUNTER — Ambulatory Visit
Admission: RE | Admit: 2018-10-25 | Discharge: 2018-10-25 | Disposition: A | Discharge status: Home / Self Care | Payer: Medicare HMO | Source: Ambulatory Visit | Attending: Family Medicine | Admitting: Family Medicine

## 2018-10-25 DIAGNOSIS — M545 Low back pain, unspecified: Secondary | ICD-10-CM

## 2019-03-03 ENCOUNTER — Other Ambulatory Visit: Payer: Self-pay | Admitting: Family Medicine

## 2019-03-03 DIAGNOSIS — N6320 Unspecified lump in the left breast, unspecified quadrant: Secondary | ICD-10-CM

## 2019-03-14 ENCOUNTER — Ambulatory Visit
Admission: RE | Admit: 2019-03-14 | Discharge: 2019-03-14 | Disposition: A | Discharge status: Home / Self Care | Payer: Medicare HMO | Source: Ambulatory Visit | Attending: Family Medicine | Admitting: Family Medicine

## 2019-03-14 ENCOUNTER — Other Ambulatory Visit: Payer: Self-pay | Admitting: Family Medicine

## 2019-03-14 DIAGNOSIS — N6310 Unspecified lump in the right breast, unspecified quadrant: Secondary | ICD-10-CM

## 2019-03-14 DIAGNOSIS — N6320 Unspecified lump in the left breast, unspecified quadrant: Secondary | ICD-10-CM

## 2019-03-15 ENCOUNTER — Ambulatory Visit
Admission: RE | Admit: 2019-03-15 | Discharge: 2019-03-15 | Disposition: A | Discharge status: Home / Self Care | Payer: Medicare HMO | Source: Ambulatory Visit | Attending: Family Medicine | Admitting: Family Medicine

## 2019-03-15 ENCOUNTER — Other Ambulatory Visit: Payer: Self-pay

## 2019-03-15 DIAGNOSIS — N6320 Unspecified lump in the left breast, unspecified quadrant: Secondary | ICD-10-CM

## 2019-03-18 ENCOUNTER — Encounter: Payer: Self-pay | Admitting: *Deleted

## 2019-03-22 ENCOUNTER — Other Ambulatory Visit: Payer: Self-pay | Admitting: *Deleted

## 2019-03-22 DIAGNOSIS — C801 Malignant (primary) neoplasm, unspecified: Secondary | ICD-10-CM

## 2019-03-22 DIAGNOSIS — C50412 Malignant neoplasm of upper-outer quadrant of left female breast: Secondary | ICD-10-CM | POA: Insufficient documentation

## 2019-03-22 DIAGNOSIS — Z170 Estrogen receptor positive status [ER+]: Secondary | ICD-10-CM | POA: Insufficient documentation

## 2019-03-22 HISTORY — DX: Malignant (primary) neoplasm, unspecified: C80.1

## 2019-03-22 NOTE — Progress Notes (Signed)
[facility] CONSULT NOTE  Patient Care Team: Patient, No Pcp Per as PCP - General (General Practice) Rockwell Germany, RN as Oncology Nurse Navigator Mauro Kaufmann, RN as Oncology Nurse Navigator Nicholas Lose, MD as Consulting Physician (Hematology and Oncology) Kyung Rudd, MD as Consulting Physician (Radiation Oncology) [name], MD as Consulting Physician (General Surgery)  CHIEF COMPLAINTS/PURPOSE OF CONSULTATION:  Newly diagnosed breast cancer  HISTORY OF PRESENTING ILLNESS:  Nicole Rush 78 y.o. female is here because of recent diagnosis of invasive mammary carcinoma of the left breast. The patient palpated a left breast mass. Mammogram and [place] on 03/14/19 showed two cysts in the right breast, and in the left breast, a 1.3cm mass at the 2 o'clock position with associated calcifications spanning in total 2.0cm, with no abnormal lymph nodes. Biopsy on 03/15/19 showed invasive mammary carcinoma, grade 2, HER-2 negative (1+), ER+ 95%, PR+ 90%, Ki67 10%. She presents to the clinic today for initial evaluation and discussion of treatment options.   I reviewed her records extensively and collaborated the history with the patient.  SUMMARY OF ONCOLOGIC HISTORY: Oncology History  Malignant neoplasm of upper-outer quadrant of left breast in female, estrogen receptor positive ([place])  03/22/2019 Initial Diagnosis   Patient palpated a left breast mass. Mammogram showed two cysts in the right breast. In the left breast, a 1.3cm mass at the 2 o'clock position with associated calcifications spanning in total 2.0cm, no abnormal lymph nodes. 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      MEDICAL HISTORY:  Past Medical History:  Diagnosis Date  . Asthma   . Hypertension   . Stroke [facility])     SURGICAL HISTORY: No past surgical history on file.  SOCIAL HISTORY: Social History   Socioeconomic History  . Marital  status: Widowed    Spouse name: Not on file  . Number of children: Not on file  . Years of education: Not on file  . Highest education level: Not on file  Occupational History  . Not on file  Social Needs  . Financial resource strain: Not on file  . Food insecurity    Worry: Not on file    Inability: Not on file  . Transportation needs    Medical: Not on file    Non-medical: Not on file  Tobacco Use  . Smoking status: Former Smoker  Substance and Sexual Activity  . Alcohol use: No  . Drug use: No  . Sexual activity: Never  Lifestyle  . Physical activity    Days per week: Not on file    Minutes per session: Not on file  . Stress: Not on file  Relationships  . Social Herbalist on phone: Not on file    Gets together: Not on file    Attends religious service: Not on file    Active member of club or organization: Not on file    Attends meetings of clubs or organizations: Not on file    Relationship status: Not on file  . Intimate partner violence    Fear of current or ex partner: Not on file    Emotionally abused: Not on file    Physically abused: Not on file    Forced sexual activity: Not on file  Other Topics Concern  . Not on file  Social History Narrative  . Not on file    FAMILY HISTORY: Family History  Problem  Relation Age of Onset  . Breast cancer Mother   . Lung cancer Father     ALLERGIES:  has No Known Allergies.  MEDICATIONS:  Current Outpatient Medications  Medication Sig Dispense Refill  . loratadine (CLARITIN) 10 MG tablet Take 10 mg by mouth daily.    Marland Kitchen losartan-hydrochlorothiazide (HYZAAR) 100-25 MG per tablet Take 1 tablet by mouth daily.     No current facility-administered medications for this visit.     REVIEW OF SYSTEMS:   Constitutional: Denies fevers, chills or abnormal night sweats Eyes: Denies blurriness of vision, double vision or watery eyes Ears, nose, mouth, throat, and face: Denies mucositis or sore throat  Respiratory: Denies cough, dyspnea or wheezes Cardiovascular: Denies palpitation, chest discomfort or lower extremity swelling Gastrointestinal:  Denies nausea, heartburn or change in bowel habits Skin: Denies abnormal skin rashes Lymphatics: Denies new lymphadenopathy or easy bruising Neurological:Denies numbness, tingling or new weaknesses Behavioral/Psych: Mood is stable, no new changes  Breast: Palpable left breast mass All other systems were reviewed with the patient and are negative.  PHYSICAL EXAMINATION: ECOG PERFORMANCE STATUS: 1 - Symptomatic but completely ambulatory  Vitals:   03/23/19 0920  BP: (!) 147/80  Pulse: 69  Resp: 18  Temp: 98.7 F (37.1 C)  SpO2: 99%   Filed Weights   03/23/19 0920  Weight: 105 lb 1.6 oz (47.7 kg)    GENERAL:alert, no distress and comfortable SKIN: skin color, texture, turgor are normal, no rashes or significant lesions EYES: normal, conjunctiva are pink and non-injected, sclera clear OROPHARYNX:no exudate, no erythema and lips, buccal mucosa, and tongue normal  NECK: supple, thyroid normal size, non-tender, without nodularity LYMPH:  no palpable lymphadenopathy in the cervical, axillary or inguinal LUNGS: clear to auscultation and percussion with normal breathing effort HEART: regular rate & rhythm and no murmurs and no lower extremity edema ABDOMEN:abdomen soft, non-tender and normal bowel sounds Musculoskeletal:no cyanosis of digits and no clubbing  PSYCH: alert & oriented x 3 with fluent speech NEURO: no focal motor/sensory deficits BREAST: No palpable nodules in breast. No palpable axillary or supraclavicular lymphadenopathy (exam performed in the presence of a chaperone)   LABORATORY DATA:  I have reviewed the data as listed Lab Results  Component Value Date   WBC 5.9 03/23/2019   HGB 11.2 (L) 03/23/2019   HCT 33.8 (L) 03/23/2019   MCV 80.3 03/23/2019   PLT 312 03/23/2019   Lab Results  Component Value Date   NA 140  03/23/2019   K 4.0 03/23/2019   CL 106 03/23/2019   CO2 25 03/23/2019    RADIOGRAPHIC STUDIES: I have personally reviewed the radiological reports and agreed with the findings in the report.  ASSESSMENT AND PLAN:  Malignant neoplasm of upper-outer quadrant of left breast in female, estrogen receptor positive ([place]) 03/22/2019:Patient palpated a left breast mass. Mammogram showed two cysts in the right breast. In the left breast, a 1.3cm mass at the 2 o'clock position with associated calcifications spanning in total 2.0cm, no abnormal lymph nodes. 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   Pathology and radiology counseling: Discussed with the patient, the details of pathology including the type of breast cancer,the clinical staging, the significance of ER, PR and HER-2/neu receptors and the implications for treatment. After reviewing the pathology in detail, we proceeded to discuss the different treatment options between surgery, radiation, antiestrogen therapies.  Recommendation: 1.  Breast conserving surgery with sentinel lymph node biopsy 2.  Plus or minus adjuvant radiation depending on final pathology especially margins 3.  Followed by adjuvant antiestrogen therapy  Patient will get a breast MRI for further evaluation of lobular cancer   Return to clinic after surgery to discuss pathology report.   All questions were answered. The patient knows to call the clinic with any problems, questions or concerns.   Rulon Eisenmenger, MD, MPH 03/23/2019    I, Molly Dorshimer, am acting as scribe for Nicholas Lose, MD.  I have reviewed the above documentation for accuracy and completeness, and I agree with the above.

## 2019-03-23 ENCOUNTER — Other Ambulatory Visit: Payer: Self-pay | Admitting: *Deleted

## 2019-03-23 ENCOUNTER — Ambulatory Visit: Payer: Medicare HMO | Attending: General Surgery | Admitting: Physical Therapy

## 2019-03-23 ENCOUNTER — Encounter: Payer: Self-pay | Admitting: Hematology and Oncology

## 2019-03-23 ENCOUNTER — Ambulatory Visit
Admission: RE | Admit: 2019-03-23 | Discharge: 2019-03-23 | Disposition: A | Discharge status: Home / Self Care | Payer: Medicare HMO | Source: Ambulatory Visit | Attending: Radiation Oncology | Admitting: Radiation Oncology

## 2019-03-23 ENCOUNTER — Inpatient Hospital Stay: Payer: Medicare HMO | Attending: Hematology and Oncology | Admitting: Hematology and Oncology

## 2019-03-23 ENCOUNTER — Other Ambulatory Visit: Payer: Self-pay

## 2019-03-23 ENCOUNTER — Inpatient Hospital Stay: Payer: Medicare HMO

## 2019-03-23 ENCOUNTER — Other Ambulatory Visit: Payer: Self-pay | Admitting: General Surgery

## 2019-03-23 ENCOUNTER — Encounter: Payer: Self-pay | Admitting: Physical Therapy

## 2019-03-23 DIAGNOSIS — C50412 Malignant neoplasm of upper-outer quadrant of left female breast: Secondary | ICD-10-CM

## 2019-03-23 DIAGNOSIS — Z87891 Personal history of nicotine dependence: Secondary | ICD-10-CM | POA: Insufficient documentation

## 2019-03-23 DIAGNOSIS — Z170 Estrogen receptor positive status [ER+]: Secondary | ICD-10-CM | POA: Insufficient documentation

## 2019-03-23 DIAGNOSIS — N6001 Solitary cyst of right breast: Secondary | ICD-10-CM | POA: Insufficient documentation

## 2019-03-23 DIAGNOSIS — R293 Abnormal posture: Secondary | ICD-10-CM | POA: Diagnosis present

## 2019-03-23 DIAGNOSIS — Z801 Family history of malignant neoplasm of trachea, bronchus and lung: Secondary | ICD-10-CM | POA: Diagnosis not present

## 2019-03-23 DIAGNOSIS — Z803 Family history of malignant neoplasm of breast: Secondary | ICD-10-CM | POA: Diagnosis not present

## 2019-03-23 LAB — CBC WITH DIFFERENTIAL (CANCER CENTER ONLY)
Abs Immature Granulocytes: 0 10*3/uL (ref 0.00–0.07)
Basophils Absolute: 0 10*3/uL (ref 0.0–0.1)
Basophils Relative: 1 %
Eosinophils Absolute: 0.2 10*3/uL (ref 0.0–0.5)
Eosinophils Relative: 3 %
HCT: 33.8 % — ABNORMAL LOW (ref 36.0–46.0)
Hemoglobin: 11.2 g/dL — ABNORMAL LOW (ref 12.0–15.0)
Immature Granulocytes: 0 %
Lymphocytes Relative: 32 %
Lymphs Abs: 1.9 10*3/uL (ref 0.7–4.0)
MCH: 26.6 pg (ref 26.0–34.0)
MCHC: 33.1 g/dL (ref 30.0–36.0)
MCV: 80.3 fL (ref 80.0–100.0)
Monocytes Absolute: 0.3 10*3/uL (ref 0.1–1.0)
Monocytes Relative: 6 %
Neutro Abs: 3.5 10*3/uL (ref 1.7–7.7)
Neutrophils Relative %: 58 %
Platelet Count: 312 10*3/uL (ref 150–400)
RBC: 4.21 MIL/uL (ref 3.87–5.11)
RDW: 16.3 % — ABNORMAL HIGH (ref 11.5–15.5)
WBC Count: 5.9 10*3/uL (ref 4.0–10.5)
nRBC: 0 % (ref 0.0–0.2)

## 2019-03-23 LAB — CMP (CANCER CENTER ONLY)
ALT: 21 U/L (ref 0–44)
AST: 21 U/L (ref 15–41)
Albumin: 3.9 g/dL (ref 3.5–5.0)
Alkaline Phosphatase: 60 U/L (ref 38–126)
Anion gap: 9 (ref 5–15)
BUN: 25 mg/dL — ABNORMAL HIGH (ref 8–23)
CO2: 25 mmol/L (ref 22–32)
Calcium: 9 mg/dL (ref 8.9–10.3)
Chloride: 106 mmol/L (ref 98–111)
Creatinine: 0.77 mg/dL (ref 0.44–1.00)
GFR, Est AFR Am: >60 mL/min (ref >60)
GFR, Estimated: >60 mL/min (ref >60)
Glucose, Bld: 89 mg/dL (ref 70–99)
Potassium: 4 mmol/L (ref 3.5–5.1)
Sodium: 140 mmol/L (ref 135–145)
Total Bilirubin: 0.4 mg/dL (ref 0.3–1.2)
Total Protein: 7.2 g/dL (ref 6.5–8.1)

## 2019-03-23 LAB — GENETIC SCREENING ORDER

## 2019-03-23 NOTE — Patient Instructions (Signed)

## 2019-03-23 NOTE — Therapy (Signed)
[place], [place], [address] Phone: [phone]   Fax:  [phone]  Physical Therapy Evaluation  Patient Details  Name: Nicole Rush MRN: [id number] Date of Birth: April 27, 1940 Referring Provider (PT): Dr. [name]   Encounter Date: 03/23/2019  PT End of Session - 03/23/19 1040    Visit Number  1    Number of Visits  2    Date for PT Re-Evaluation  05/18/19    PT Start Time  0940    PT Stop Time  1007    PT Time Calculation (min)  27 min    Activity Tolerance  Patient tolerated treatment well    Behavior During Therapy  [facility] for tasks assessed/performed       Past Medical History:  Diagnosis Date  . Asthma   . Hypertension   . Stroke [facility])     History reviewed. No pertinent surgical history.  There were no vitals filed for this visit.   Subjective Assessment - 03/23/19 1022    Subjective  Patient reports she is here today to be seen by her medical team for her newly diagnosed left breast cancer.    Pertinent History  Patient was diagnosed on 03/14/2019 with left invasive lobular carcinoma breast cancer. It measures 1.3 cm and is located in the upper outer quadrant. It is ER/PR positive and HER2 negative with a Ki67 of 10%.    Patient Stated Goals  Reduce lymphedema risk and learn post op shoulder ROM HEP    Currently in Pain?  No/denies         [facility] PT Assessment - 03/23/19 0001      Assessment   Medical Diagnosis  Left breast cancer    Referring Provider (PT)  Dr. [name]    Onset Date/Surgical Date  03/14/19    Hand Dominance  Right    Prior Therapy  none      Precautions   Precautions  Other (comment)    Precaution Comments  active cancer      Restrictions   Weight Bearing Restrictions  No      Balance Screen   Has the patient fallen in the past 6 months  No    Has the patient had a decrease in activity level because of a fear of falling?   No    Is the  patient reluctant to leave their home because of a fear of falling?   No      Home Environment   Living Environment  Private residence    [place] lives close by   Available Help at Discharge  Family      Prior Function   Level of [place]  Retired    Leisure  She walks around [place] and is very active; also lifts upper body weights      Cognition   Overall Cognitive Status  Within Functional Limits for tasks assessed      Posture/Postural Control   Posture/Postural Control  Postural limitations    Postural Limitations  Rounded Shoulders;Forward head      ROM / Strength   AROM / PROM / Strength  AROM;Strength      AROM   AROM Assessment Site  Shoulder;Cervical    Right/Left Shoulder  Right;Left    Right Shoulder Extension  51 Degrees    Right Shoulder Flexion  132 Degrees    Right Shoulder ABduction  136 Degrees    Right Shoulder Internal Rotation  56 Degrees    Right Shoulder External Rotation  70 Degrees    Left Shoulder Extension  65 Degrees    Left Shoulder Flexion  140 Degrees    Left Shoulder ABduction  146 Degrees    Left Shoulder Internal Rotation  66 Degrees    Left Shoulder External Rotation  85 Degrees    Cervical Flexion  WNL    Cervical Extension  25% limited    Cervical - Right Side Bend  50% limited    Cervical - Left Side Bend  50% limited    Cervical - Right Rotation  25% limited    Cervical - Left Rotation  25% limited      Strength   Overall Strength  Within functional limits for tasks performed        LYMPHEDEMA/ONCOLOGY QUESTIONNAIRE - 03/23/19 1037      Type   Cancer Type  Left breast cancer      Lymphedema Assessments   Lymphedema Assessments  Upper extremities      Right Upper Extremity Lymphedema   10 cm Proximal to Olecranon Process  24.6 cm    Olecranon Process  22.5 cm    10 cm Proximal to Ulnar Styloid Process  19.5 cm    Just Proximal to Ulnar Styloid Process  14.2  cm    Across Hand at [employer]  18.1 cm    At [place] of 2nd Digit  6.5 cm      Left Upper Extremity Lymphedema   10 cm Proximal to Olecranon Process  23.4 cm    Olecranon Process  21.1 cm    10 cm Proximal to Ulnar Styloid Process  18.8 cm    Just Proximal to Ulnar Styloid Process  14.3 cm    Across Hand at [employer]  18.2 cm    At [place] of 2nd Digit  6 cm          Quick Dash - 03/23/19 0001    Open a tight or new jar  No difficulty    Do heavy household chores (wash walls, wash floors)  No difficulty    Carry a shopping bag or briefcase  No difficulty    Wash your back  No difficulty    Use a knife to cut food  No difficulty    Recreational activities in which you take some force or impact through your arm, shoulder, or hand (golf, hammering, tennis)  No difficulty    During the past week, to what extent has your arm, shoulder or hand problem interfered with your normal social activities with family, friends, neighbors, or groups?  Not at all    During the past week, to what extent has your arm, shoulder or hand problem limited your work or other regular daily activities  Not at all    Arm, shoulder, or hand pain.  None    Tingling (pins and needles) in your arm, shoulder, or hand  None    Difficulty Sleeping  No difficulty    DASH Score  0 %        Objective measurements completed on examination: See above findings.     Patient was instructed today in a home exercise program today for post op shoulder range of motion. These included active assist shoulder flexion in sitting, scapular retraction, wall walking with shoulder abduction, and hands behind head external rotation.  She was encouraged to do  these twice a day, holding 3 seconds and repeating 5 times when permitted by her physician.       PT Education - 03/23/19 1039    Education Details  Lymphedema risk reduction and post op shoulder ROM HEP    Person(s) Educated  Patient;Other (comment)   Friend  [place]   Methods  Explanation;Demonstration;Handout    Comprehension  Returned demonstration;Verbalized understanding          PT Long Term Goals - 03/23/19 1044      PT LONG TERM GOAL #1   Title  Patient will demonstrate she has regained full shoulder ROM and function post operatively compared to baseline assessment.    Time  8    Period  Weeks    Status  New    Target Date  05/18/19      Breast Clinic Goals - 03/23/19 1044      Patient will be able to verbalize understanding of pertinent lymphedema risk reduction practices relevant to her diagnosis specifically related to skin care.   Time  1    Period  Days    Status  Achieved      Patient will be able to return demonstrate and/or verbalize understanding of the post-op home exercise program related to regaining shoulder range of motion.   Time  1    Period  Days    Status  Achieved      Patient will be able to verbalize understanding of the importance of attending the postoperative After Breast Cancer Class for further lymphedema risk reduction education and therapeutic exercise.   Time  1    Period  Days    Status  Achieved            Plan - 03/23/19 1040    Clinical Impression Statement  Patient was diagnosed on 03/14/2019 with left invasive lobular carcinoma breast cancer. It measures 1.3 cm and is located in the upper outer quadrant. It is ER/PR positive and HER2 negative with a Ki67 of 10%. Her multidisciplinary medical team met prior to her assessments to determine a recommended treatment plan. She is planning to have an MRI, left lumpectomy and sentinel node biopsy, possible radiation, and anti-estrogen therapy. She will benefit from a post op PT visit to reassess and determine needs.    Stability/Clinical Decision Making  Stable/Uncomplicated    Clinical Decision Making  Low    Rehab Potential  Excellent    PT Frequency  --   Eval and 1 f/u visit   PT Treatment/Interventions  ADLs/Self Care Home  Management;Therapeutic exercise    PT Next Visit Plan  Will reassess 3-4 weeks post op to determine needs    PT Home Exercise Plan  Post op shoulder ROM HEP    Consulted and Agree with Plan of Care  Patient;Family member/caregiver    Family Member Consulted  Friend named [place]       Patient will benefit from skilled therapeutic intervention in order to improve the following deficits and impairments:  Postural dysfunction, Decreased range of motion, Decreased knowledge of precautions, Impaired UE functional use, Pain  Visit Diagnosis: Malignant neoplasm of upper-outer quadrant of left breast in female, estrogen receptor positive ([place]) - Plan: PT plan of care cert/re-cert  Abnormal posture - Plan: PT plan of care cert/re-cert     Problem List Patient Active Problem List   Diagnosis Date Noted  . Malignant neoplasm of upper-outer quadrant of left breast in female, estrogen receptor positive ([place]) 03/22/2019  .  Chest pain 08/11/2012  . GERD (gastroesophageal reflux disease) 08/11/2012  . Generalized anxiety disorder 08/11/2012   Annia Friendly, PT 03/23/19 10:47 AM  [place], [place], [address] Phone: [phone]   Fax:  [phone]  Name: Nicole Rush MRN: [id number] Date of Birth: 25-Sep-1940

## 2019-03-23 NOTE — Assessment & Plan Note (Signed)
03/22/2019:Patient palpated a left breast mass. Mammogram showed two cysts in the right breast. In the left breast, a 1.3cm mass at the 2 o'clock position with associated calcifications spanning in total 2.0cm, no abnormal lymph nodes. 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   Pathology and radiology counseling: Discussed with the patient, the details of pathology including the type of breast cancer,the clinical staging, the significance of ER, PR and HER-2/neu receptors and the implications for treatment. After reviewing the pathology in detail, we proceeded to discuss the different treatment options between surgery, radiation, antiestrogen therapies.  Recommendation: 1.  Breast conserving surgery with sentinel lymph node biopsy 2.  Plus or minus adjuvant radiation depending on final pathology especially margins 3.  Followed by adjuvant antiestrogen therapy  Patient will get a breast MRI for further evaluation of lobular cancer These are [facility] okay okay okay yeah MDC just need to write  

## 2019-03-23 NOTE — Progress Notes (Signed)
Radiation Oncology         [phone]) [phone] ________________________________  Name: Nicole Rush        MRN: [id number]  Date of Service: 03/23/2019 DOB: [birth year]  OQ:[id number], No Pcp Per  [name], MD     REFERRING PHYSICIAN: [name], MD   DIAGNOSIS: The encounter diagnosis was Malignant neoplasm of upper-outer quadrant of left breast in female, estrogen receptor positive ([place]).   HISTORY OF PRESENT ILLNESS: Nicole Rush is a 78 y.o. female seen in the multidisciplinary breast clinic for a new diagnosis of left breast cancer. The patient was noted to have a palpable mass in the left breast for which she sought further evaluation.  Diagnostic imaging revealed a mass in the upper outer quadrant in the 2 o'clock position measuring 1.3 x 1.1 x 1.1 cm.  Her axilla was negative for adenopathy.  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  Her tumor was ER/PR positive, HER-2 negative, and her Ki-67 was 10%.  She is seen today to discuss treatment recommendations for her cancer.    PREVIOUS RADIATION THERAPY: No   PAST MEDICAL HISTORY:  Past Medical History:  Diagnosis Date   Asthma    Hypertension    Stroke ([place])        PAST SURGICAL HISTORY:No past surgical history on file.   FAMILY HISTORY:  Family History  Problem Relation Age of Onset   Breast cancer Mother    Lung cancer Father      SOCIAL HISTORY:  reports that she has quit smoking. She does not have any smokeless tobacco history on file. She reports that she does not drink alcohol or use drugs.  The patient is widowed and resides in [place]. She is accompanied by a friend today. She reports she has 7 children and many grand and great children.    ALLERGIES: Patient has no known allergies.   MEDICATIONS:  Current Outpatient Medications  Medication Sig Dispense Refill   loratadine (CLARITIN) 10 MG tablet Take 10 mg by mouth daily.      losartan-hydrochlorothiazide (HYZAAR) 100-25 MG per tablet Take 1 tablet by mouth daily.     No current facility-administered medications for this encounter.      REVIEW OF SYSTEMS: On review of systems, the patient reports that she is doing well overall. She denies any chest pain, shortness of breath, cough, fevers, chills, night sweats, unintended weight changes. She denies any bowel or bladder disturbances, and denies abdominal pain, nausea or vomiting. She denies any new musculoskeletal or joint aches or pains. A complete review of systems is obtained and is otherwise negative.     PHYSICAL EXAM:  Wt Readings from Last 3 Encounters:  03/23/19 105 lb 1.6 oz (47.7 kg)  08/11/12 127 lb 13.9 oz (58 kg)   Temp Readings from Last 3 Encounters:  03/23/19 98.7 F (37.1 C) (Temporal)  03/13/13 97.9 F (36.6 C) (Oral)  08/11/12 98.1 F (36.7 C) (Oral)   BP Readings from Last 3 Encounters:  03/23/19 (!) 147/80  03/13/13 115/72  08/11/12 125/78   Pulse Readings from Last 3 Encounters:  03/23/19 69  03/13/13 75  08/11/12 80   In general this is a well appearing African-American female in no acute distress .  She's alert and oriented x4 and appropriate throughout the examination. Cardiopulmonary assessment is negative for acute distress and she exhibits normal effort.  Bilateral breast exam is deferred   ECOG = 0  0 -  Asymptomatic (Fully active, able to carry on all predisease activities without restriction)  1 - Symptomatic but completely ambulatory (Restricted in physically strenuous activity but ambulatory and able to carry out work of a light or sedentary nature. For example, light housework, office work)  2 - Symptomatic, <50% in bed during the day (Ambulatory and capable of all self care but unable to carry out any work activities. Up and about more than 50% of waking hours)  3 - Symptomatic, >50% in bed, but not bedbound (Capable of only limited self-care, confined to bed or  chair 50% or more of waking hours)  4 - Bedbound (Completely disabled. Cannot carry on any self-care. Totally confined to bed or chair)  5 - Death   Eustace Pen MM, Creech RH, Tormey DC, et al. [phone]). "Toxicity and response criteria of the [facility]". [place] Oncol. 5 (6): 649-55    LABORATORY DATA:  Lab Results  Component Value Date   WBC 5.9 03/23/2019   HGB 11.2 (L) 03/23/2019   HCT 33.8 (L) 03/23/2019   MCV 80.3 03/23/2019   PLT 312 03/23/2019   Lab Results  Component Value Date   NA 140 03/23/2019   K 4.0 03/23/2019   CL 106 03/23/2019   CO2 25 03/23/2019   Lab Results  Component Value Date   ALT 21 03/23/2019   AST 21 03/23/2019   ALKPHOS 60 03/23/2019   BILITOT 0.4 03/23/2019      RADIOGRAPHY: US Breast Ltd Uni Left Inc Axilla  Result Date: 03/14/2019 CLINICAL DATA:  Patient presents with a new palpable mass in the left breast. EXAM: DIGITAL DIAGNOSTIC BILATERAL MAMMOGRAM WITH CAD AND TOMO ULTRASOUND BILATERAL BREAST COMPARISON:  Previous exam(s). ACR Breast Density Category b: There are scattered areas of fibroglandular density. FINDINGS: Mammogram: Right breast: In the outer aspect of the right breast there is an oval mass measuring 0.7 cm. Additionally in the central inferior aspect of the right breast there is an oval mass measuring approximately 0.9 cm. The calcifications associated with the mass span approximately 2.0 cm. No additional findings in the right breast. Left breast: The palpable site of concern in the upper outer left breast there is an irregular spiculated mass with calcifications measuring approximately 1.4 cm. Mammographic images were processed with CAD. On physical exam, I palpate a fixed discrete mass in the upper outer quadrant at the palpable site of concern. Ultrasound: Targeted ultrasound is performed in the right breast at 6 o'clock 1 cm from the nipple demonstrating an oval circumscribed anechoic mass measuring 0.9 x  0.5 x 1.0 cm, consistent with a simple cyst, which corresponds to the mammographic finding. At 9 o'clock 4 cm from the nipple there is an oval circumscribed anechoic mass with some internal debris overall measuring 0.7 x 0.1 x 0.6 cm, consistent with a cyst, which corresponds to the mammographic finding. Targeted ultrasound of the left breast at 2 o'clock 7 cm from nipple at the palpable site of concern demonstrates an irregular spiculated mass with internal blood flow measuring 1.3 x 1.1 x 1.1 cm, which corresponds to the mammographic finding. Targeted ultrasound of the left axilla demonstrates no suspicious appearing lymph nodes. IMPRESSION: 1. Left breast mass at 2 o'clock measuring 1.3 cm is highly suspicious. Associated calcifications with the mass span 2.0 cm. 2. No mammographic or sonographic evidence of malignancy in the right breast. RECOMMENDATION: Ultrasound-guided core needle biopsy of the left breast mass at 2 o'clock. I have discussed the findings and  recommendations with the patient. If applicable, a reminder letter will be sent to the patient regarding the next appointment. BI-RADS CATEGORY  5: Highly suggestive of malignancy. Electronically Signed   By: Audie Pinto M.D.   On: 03/14/2019 11:16   US Breast Ltd Uni Right Inc Axilla  Result Date: 03/14/2019 CLINICAL DATA:  Patient presents with a new palpable mass in the left breast. EXAM: DIGITAL DIAGNOSTIC BILATERAL MAMMOGRAM WITH CAD AND TOMO ULTRASOUND BILATERAL BREAST COMPARISON:  Previous exam(s). ACR Breast Density Category b: There are scattered areas of fibroglandular density. FINDINGS: Mammogram: Right breast: In the outer aspect of the right breast there is an oval mass measuring 0.7 cm. Additionally in the central inferior aspect of the right breast there is an oval mass measuring approximately 0.9 cm. The calcifications associated with the mass span approximately 2.0 cm. No additional findings in the right breast. Left breast:  The palpable site of concern in the upper outer left breast there is an irregular spiculated mass with calcifications measuring approximately 1.4 cm. Mammographic images were processed with CAD. On physical exam, I palpate a fixed discrete mass in the upper outer quadrant at the palpable site of concern. Ultrasound: Targeted ultrasound is performed in the right breast at 6 o'clock 1 cm from the nipple demonstrating an oval circumscribed anechoic mass measuring 0.9 x 0.5 x 1.0 cm, consistent with a simple cyst, which corresponds to the mammographic finding. At 9 o'clock 4 cm from the nipple there is an oval circumscribed anechoic mass with some internal debris overall measuring 0.7 x 0.1 x 0.6 cm, consistent with a cyst, which corresponds to the mammographic finding. Targeted ultrasound of the left breast at 2 o'clock 7 cm from nipple at the palpable site of concern demonstrates an irregular spiculated mass with internal blood flow measuring 1.3 x 1.1 x 1.1 cm, which corresponds to the mammographic finding. Targeted ultrasound of the left axilla demonstrates no suspicious appearing lymph nodes. IMPRESSION: 1. Left breast mass at 2 o'clock measuring 1.3 cm is highly suspicious. Associated calcifications with the mass span 2.0 cm. 2. No mammographic or sonographic evidence of malignancy in the right breast. RECOMMENDATION: Ultrasound-guided core needle biopsy of the left breast mass at 2 o'clock. I have discussed the findings and recommendations with the patient. If applicable, a reminder letter will be sent to the patient regarding the next appointment. BI-RADS CATEGORY  5: Highly suggestive of malignancy. Electronically Signed   By: Audie Pinto M.D.   On: 03/14/2019 11:16   Mm Diag Breast Tomo Bilateral  Result Date: 03/14/2019 CLINICAL DATA:  Patient presents with a new palpable mass in the left breast. EXAM: DIGITAL DIAGNOSTIC BILATERAL MAMMOGRAM WITH CAD AND TOMO ULTRASOUND BILATERAL BREAST COMPARISON:   Previous exam(s). ACR Breast Density Category b: There are scattered areas of fibroglandular density. FINDINGS: Mammogram: Right breast: In the outer aspect of the right breast there is an oval mass measuring 0.7 cm. Additionally in the central inferior aspect of the right breast there is an oval mass measuring approximately 0.9 cm. The calcifications associated with the mass span approximately 2.0 cm. No additional findings in the right breast. Left breast: The palpable site of concern in the upper outer left breast there is an irregular spiculated mass with calcifications measuring approximately 1.4 cm. Mammographic images were processed with CAD. On physical exam, I palpate a fixed discrete mass in the upper outer quadrant at the palpable site of concern. Ultrasound: Targeted ultrasound is performed in the right breast at  6 o'clock 1 cm from the nipple demonstrating an oval circumscribed anechoic mass measuring 0.9 x 0.5 x 1.0 cm, consistent with a simple cyst, which corresponds to the mammographic finding. At 9 o'clock 4 cm from the nipple there is an oval circumscribed anechoic mass with some internal debris overall measuring 0.7 x 0.1 x 0.6 cm, consistent with a cyst, which corresponds to the mammographic finding. Targeted ultrasound of the left breast at 2 o'clock 7 cm from nipple at the palpable site of concern demonstrates an irregular spiculated mass with internal blood flow measuring 1.3 x 1.1 x 1.1 cm, which corresponds to the mammographic finding. Targeted ultrasound of the left axilla demonstrates no suspicious appearing lymph nodes. IMPRESSION: 1. Left breast mass at 2 o'clock measuring 1.3 cm is highly suspicious. Associated calcifications with the mass span 2.0 cm. 2. No mammographic or sonographic evidence of malignancy in the right breast. RECOMMENDATION: Ultrasound-guided core needle biopsy of the left breast mass at 2 o'clock. I have discussed the findings and recommendations with the patient.  If applicable, a reminder letter will be sent to the patient regarding the next appointment. BI-RADS CATEGORY  5: Highly suggestive of malignancy. Electronically Signed   By: Audie Pinto M.D.   On: 03/14/2019 11:16   Mm Clip Placement Left  Result Date: 03/15/2019 CLINICAL DATA:  Post ultrasound-guided biopsy of a palpable mass in the left breast at the 2 o'clock position. EXAM: DIAGNOSTIC LEFT MAMMOGRAM POST ULTRASOUND BIOPSY COMPARISON:  Previous exam(s). FINDINGS: Mammographic images were obtained following ultrasound guided biopsy of a mass in the left breast at the 2 o'clock position. A ribbon shaped biopsy marking clip is present at the site of the biopsied mass in the left breast at the 2 o'clock position. IMPRESSION: Ribbon shaped biopsy marking clip at site of biopsied mass in the left breast at the 2 o'clock position. Final Assessment: Post Procedure Mammograms for Marker Placement Electronically Signed   By: Everlean Alstrom M.D.   On: 03/15/2019 16:25   [place] Lt Breast Bx W Loc Dev 1st Lesion Img Bx Spec US Guide  Addendum Date: 03/16/2019   ADDENDUM REPORT: 03/16/2019 13:44 ADDENDUM: Pathology revealed GRADE II INVASIVE MAMMARY CARCINOMA of the Left breast, 2 o'clock. This was found to be concordant by Dr. Everlean Alstrom. Pathology results were discussed with the patient by telephone. The patient reported doing well after the biopsy with tenderness at the site. Post biopsy instructions and care were reviewed and questions were answered. The patient was encouraged to call [facility] for any additional concerns. The patient was referred to [facility] on March 23, 2019. Pathology results reported by Terie Purser, RN on 03/16/2019. Electronically Signed   By: Everlean Alstrom M.D.   On: 03/16/2019 13:44   Result Date: 03/16/2019 CLINICAL DATA:  78 year old female with a suspicious  mass in the left breast at the 2 o'clock position. EXAM: ULTRASOUND GUIDED LEFT BREAST CORE NEEDLE BIOPSY COMPARISON:  Previous exam(s). FINDINGS: I met with the patient and we discussed the procedure of ultrasound-guided biopsy, including benefits and alternatives. We discussed the high likelihood of a successful procedure. We discussed the risks of the procedure, including infection, bleeding, tissue injury, clip migration, and inadequate sampling. Informed written consent was given. The usual time-out protocol was performed immediately prior to the procedure. Lesion quadrant: Upper-outer Using sterile technique and 1% Lidocaine as local anesthetic, under direct ultrasound visualization, a 14  gauge spring-loaded device was used to perform biopsy of mass in the left breast at the 2 o'clock position using a lateral to medial approach. At the conclusion of the procedure ribbon shaped tissue marker clip was deployed into the biopsy cavity. Follow up 2 view mammogram was performed and dictated separately. IMPRESSION: Ultrasound guided biopsy of the mass in the left breast at the 2 o'clock position. No apparent complications. Electronically Signed: By: Everlean Alstrom M.D. On: 03/15/2019 16:18       IMPRESSION/PLAN: 1. 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 Dr. Lisbeth Renshaw discusses the pathology findings and reviews the nature of left breast disease. The consensus from the breast conference includes the  an MRI to assess for extent of disease.  She appears to be a good candidate for breast conservation with lumpectomy with sentinel node biopsy.  She would be a candidate for adjuvant antiestrogen therapy as well to reduce the risks of recurrence.  We discussed that we would consider adjuvant whole breast radiotherapy to the left breast depending on her margin status which will be reviewed with her final pathology results.  While it is unclear whether she needs  radiotherapy at this point, we did discuss the risks, benefits, short, and long term effects of radiotherapy. Dr. Lisbeth Renshaw discusses the delivery and logistics of radiotherapy and in this situation may offer a course of 4- 61/2 weeks of radiotherapy. We will see her back about 2 weeks after surgery to discuss the final results of pathology, and to consider simulation process if radiotherapy is indicated.  In a visit lasting 45 minutes, greater than 50% of the time was spent face to face discussing her case, and coordinating the patient's care.  The above documentation reflects my direct findings during this shared patient visit. Please see the separate note by Dr. Lisbeth Renshaw on this date for the remainder of the patient's plan of care.    Carola Rhine, PAC

## 2019-03-30 ENCOUNTER — Telehealth: Payer: Self-pay | Admitting: Hematology and Oncology

## 2019-03-30 NOTE — H&P (Signed)
Nicole Rush Documented: 03/23/2019 7:38 AM Location: [facility] Patient #: [id number] DOB: 18-Jan-1941 Undefined / Language: Nicole Rush / Race: Black or African American Female   History of Present Illness Nicole Klein MD; 03/23/2019 12:35 PM) The patient is a 78 year old female who presents with breast cancer. Pt is a lovely 78 yo F who presents with new left breast cancer 02/2019. She presented to her primary care physician with a palpable left breast mass. She was seen to have a 1.3 cm mass at 2 o'clock of the left breast. 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 She denies pain. She has a benign biopsy on that breast in the past. She denies pain. Her mother had breast cancer in her 58s.   She had menarche at age 89. She is post menopausal. She had 3 children but doesn't remember how old she was. She has had a colonoscopy that was 20 years ago. She has also had a bone density study. Of note she walks quite a bit daily. She also likes lifting weights.   dx mammogram/us 03/14/2019 CLINICAL DATA: Patient presents with a new palpable mass in the left breast.  EXAM: DIGITAL DIAGNOSTIC BILATERAL MAMMOGRAM WITH CAD AND TOMO  ULTRASOUND BILATERAL BREAST  COMPARISON: Previous exam(s).  ACR Breast Density Category b: There are scattered areas of fibroglandular density.  FINDINGS: Mammogram:  Right breast: In the outer aspect of the right breast there is an oval mass measuring 0.7 cm. Additionally in the central inferior aspect of the right breast there is an oval mass measuring approximately 0.9 cm. The calcifications associated with the mass span approximately 2.0 cm. No additional findings in the right breast.  Left breast: The palpable site of concern in the upper outer left breast there is an irregular spiculated mass with calcifications measuring approximately 1.4  cm.  Mammographic images were processed with CAD.  On physical exam, I palpate a fixed discrete mass in the upper outer quadrant at the palpable site of concern.  Ultrasound:  Targeted ultrasound is performed in the right breast at 6 o'clock 1 cm from the nipple demonstrating an oval circumscribed anechoic mass measuring 0.9 x 0.5 x 1.0 cm, consistent with a simple cyst, which corresponds to the mammographic finding. At 9 o'clock 4 cm from the nipple there is an oval circumscribed anechoic mass with some internal debris overall measuring 0.7 x 0.1 x 0.6 cm, consistent with a cyst, which corresponds to the mammographic finding.  Targeted ultrasound of the left breast at 2 o'clock 7 cm from nipple at the palpable site of concern demonstrates an irregular spiculated mass with internal blood flow measuring 1.3 x 1.1 x 1.1 cm, which corresponds to the mammographic finding. Targeted ultrasound of the left axilla demonstrates no suspicious appearing lymph nodes.  IMPRESSION: 1. Left breast mass at 2 o'clock measuring 1.3 cm is highly suspicious. Associated calcifications with the mass span 2.0 cm.  2. No mammographic or sonographic evidence of malignancy in the right breast.  RECOMMENDATION: Ultrasound-guided core needle biopsy of the left breast mass at 2 o'clock.  I have discussed the findings and recommendations with the patient. If applicable, a reminder letter will be sent to the patient regarding the next appointment.  BI-RADS CATEGORY 5: Highly suggestive of malignancy  pathology 03/15/2019 Diagnosis Breast, left, needle core biopsy, 2 o'clock - INVASIVE MAMMARY CARCINOMA, GRADE 2. Immunostain for E-cadherin is  negative in the tumor cells, consistent with a lobular phenotype. Estrogen Receptor: 95%, POSITIVE, STRONG STAINING INTENSITY Progesterone Receptor: 90%, POSITIVE, STRONG STAINING INTENSITY Proliferation Marker Ki67: 10% The tumor cells are NEGATIVE for Her2  (1+).   Past Surgical History Nicole Pummel, RN; 03/23/2019 7:38 AM) Breast Mass; Local Excision  Left.  Diagnostic Studies History Nicole Pummel, RN; 03/23/2019 7:38 AM) Colonoscopy  >10 years ago Mammogram  within last year Pap Smear  1-5 years ago  Medication History Nicole Pummel, RN; 03/23/2019 7:38 AM) Medications Reconciled  Social History Nicole Pummel, RN; 03/23/2019 7:38 AM) Alcohol use  Remotely quit alcohol use. Caffeine use  Carbonated beverages, Coffee, Tea. No drug use  Tobacco use  Never smoker.  Family History Nicole Pummel, RN; 03/23/2019 7:38 AM) Breast Cancer  Mother.  Pregnancy / Birth History Nicole Pummel, RN; 03/23/2019 7:38 AM) Age at menarche  1 years. Age of menopause  >71 Gravida  7 Para  7 Regular periods   Other Problems Nicole Pummel, RN; 03/23/2019 7:38 AM) Arthritis  Back Pain  Bladder Problems  Cerebrovascular Accident     Review of Systems Nicole Pummel RN; 03/23/2019 7:38 AM) General Not Present- Appetite Loss, Chills, Fatigue, Fever, Night Sweats, Weight Gain and Weight Loss. Skin Not Present- Change in Wart/Mole, Dryness, Hives, Jaundice, New Lesions, Non-Healing Wounds, Rash and Ulcer. HEENT Present- Wears glasses/contact lenses. Not Present- Earache, Hearing Loss, Hoarseness, Nose Bleed, Oral Ulcers, Ringing in the Ears, Seasonal Allergies, Sinus Pain, Sore Throat, Visual Disturbances and Yellow Eyes. Respiratory Not Present- Bloody sputum, Chronic Cough, Difficulty Breathing, Snoring and Wheezing. Breast Not Present- Breast Mass, Breast Pain, Nipple Discharge and Skin Changes. Cardiovascular Present- Leg Cramps. Not Present- Chest Pain, Difficulty Breathing Lying Down, Palpitations, Rapid Heart Rate, Shortness of Breath and Swelling of Extremities. Gastrointestinal Not Present- Abdominal Pain, Bloating, Bloody Stool, Change in Bowel Habits, Chronic diarrhea, Constipation, Difficulty Swallowing, Excessive  gas, Gets full quickly at meals, Hemorrhoids, Indigestion, Nausea, Rectal Pain and Vomiting. Female Genitourinary Present- Painful Urination. Not Present- Frequency, Nocturia, Pelvic Pain and Urgency. Musculoskeletal Present- Back Pain and Joint Stiffness. Not Present- Joint Pain, Muscle Pain, Muscle Weakness and Swelling of Extremities. Neurological Not Present- Decreased Memory, Fainting, Headaches, Numbness, Seizures, Tingling, Tremor, Trouble walking and Weakness. Psychiatric Not Present- Anxiety, Bipolar, Change in Sleep Pattern, Depression, Fearful and Frequent crying. Endocrine Not Present- Cold Intolerance, Excessive Hunger, Hair Changes, Heat Intolerance, Hot flashes and New Diabetes. Hematology Not Present- Blood Thinners, Easy Bruising, Excessive bleeding, Gland problems, HIV and Persistent Infections.  Vitals Nicole Klein MD; 03/23/2019 12:24 PM) 03/23/2019 12:23 PM Weight: 105.1 lb Height: 60in Body Surface Area: 1.42 m Body Mass Index: 20.53 kg/m  Temp.: 98.79F  Pulse: 69 (Regular)  Resp.: 18 (Unlabored)  BP: 147/80 (Sitting, Left Arm, Standard)       Physical Exam Nicole Klein MD; 03/23/2019 12:32 PM) General Mental Status-Alert. General Appearance-Consistent with stated age. Hydration-Well hydrated. Voice-Normal.  Head and Neck Head-normocephalic, atraumatic with no lesions or palpable masses. Trachea-midline. Thyroid Gland Characteristics - normal size and consistency.  Eye Eyeball - Bilateral-Extraocular movements intact. Sclera/Conjunctiva - Bilateral-No scleral icterus.  Chest and Lung Exam Chest and lung exam reveals -quiet, even and easy respiratory effort with no use of accessory muscles and on auscultation, normal breath sounds, no adventitious sounds and normal vocal resonance. Inspection Chest Wall - Normal. Back - normal.  Breast Note: breasts are relatively symmetric in size. mild ptosis. no palpable mass on the  right. no LAD. left breast has a  1.5-2 cm palpable mobile mass that is around 4 cm from axilla. no nipple retraction, no skin dimpling. no LAD on this side as well.   Cardiovascular Cardiovascular examination reveals -normal heart sounds, regular rate and rhythm with no murmurs and normal pedal pulses bilaterally.  Abdomen Inspection Inspection of the abdomen reveals - No Hernias. Palpation/Percussion Palpation and Percussion of the abdomen reveal - Soft, Non Tender, No Rebound tenderness, No Rigidity (guarding) and No hepatosplenomegaly. Auscultation Auscultation of the abdomen reveals - Bowel sounds normal.  Neurologic Neurologic evaluation reveals -alert and oriented x 3 with no impairment of recent or remote memory. Mental Status-Normal.  Musculoskeletal Global Assessment -Note: no gross deformities.  Normal Exam - Left-Upper Extremity Strength Normal and Lower Extremity Strength Normal. Normal Exam - Right-Upper Extremity Strength Normal and Lower Extremity Strength Normal.  Lymphatic Head & Neck  General Head & Neck Lymphatics: Bilateral - Description - Normal. Axillary  General Axillary Region: Bilateral - Description - Normal. Tenderness - Non Tender. Femoral & Inguinal  Generalized Femoral & Inguinal Lymphatics: Bilateral - Description - No Generalized lymphadenopathy.    Assessment & Plan Nicole Klein MD; 03/23/2019 12:37 PM) MALIGNANT NEOPLASM OF UPPER-OUTER QUADRANT OF LEFT BREAST IN FEMALE, ESTROGEN RECEPTOR POSITIVE (C50.412) Impression: Pt has a new diagnosis of left breast cancer, cT1cN0. This is a lobular cancer. Because of this, we will get an MRI to make sure nothing is being missed. As long as that is negative, will plan left lumpectomy with sentinel lymph node biopsy. She does not need a seed as this mass is easily palpable.  The surgical procedure was described to the patient. I discussed the incision type and location and that we would need  radiology involved on with a wire or seed marker and/or sentinel node.  The risks and benefits of the procedure were described to the patient and she wishes to proceed.  We discussed the risks bleeding, infection, damage to other structures, need for further procedures/surgeries. We discussed the risk of seroma. The patient was advised if the area in the breast in cancer, we may need to go back to surgery for additional tissue to obtain negative margins or for a lymph node biopsy. The patient was advised that these are the most common complications, but that others can occur as well. They were advised against taking aspirin or other anti-inflammatory agents/blood thinners the week before surgery. Current Plans MR BREAST BILAT WO/W CON ([id number]) (already scheduled.) You are being scheduled for surgery- Our schedulers will call you.  You should hear from our office's scheduling department within 5 working days about the location, date, and time of surgery. We try to make accommodations for patient's preferences in scheduling surgery, but sometimes the OR schedule or the surgeon's schedule prevents [place] from making those accommodations.  If you have not heard from our office [phone]) in 5 working days, call the office and ask for your surgeon's nurse.  If you have other questions about your diagnosis, plan, or surgery, call the office and ask for your surgeon's nurse.  Pt Education - [facility] breast cancer surgery: discussed with patient and provided information.   Signed electronically by Nicole Klein, MD (03/23/2019 12:39 PM)

## 2019-03-30 NOTE — Telephone Encounter (Signed)
Scheduled appt per 12/4 sch message =- called pt , no answer and no vmail . Mailed appt letter

## 2019-03-31 ENCOUNTER — Telehealth: Payer: Self-pay | Admitting: *Deleted

## 2019-03-31 DIAGNOSIS — Z170 Estrogen receptor positive status [ER+]: Secondary | ICD-10-CM

## 2019-03-31 NOTE — Telephone Encounter (Signed)
Attempted to call patient several times but unable to get through to follow up from [facility].

## 2019-04-01 ENCOUNTER — Encounter (HOSPITAL_BASED_OUTPATIENT_CLINIC_OR_DEPARTMENT_OTHER): Payer: Self-pay | Admitting: General Surgery

## 2019-04-01 ENCOUNTER — Other Ambulatory Visit: Payer: Self-pay

## 2019-04-01 ENCOUNTER — Ambulatory Visit (HOSPITAL_COMMUNITY): Payer: Medicare HMO

## 2019-04-01 ENCOUNTER — Telehealth: Payer: Self-pay | Admitting: Radiation Oncology

## 2019-04-01 NOTE — Progress Notes (Signed)
I called a spoke with Dr. Marlowe Aschoff office to inform them that a man has called back to say that the number [phone] is the wrong contact number for this pt. Also to inform that several attempts have been made to contact this pt on several different listed numbers for her and her daughters yet all of these number are either wrong numbers, unreachable or disconnected numbers. The office is messaging Dr. Marlowe Aschoff nurse. Will continue to follow the situation.

## 2019-04-01 NOTE — Telephone Encounter (Signed)
New message: ° ° °LVM for patient to return call to schedule appt from referral received. °

## 2019-04-02 ENCOUNTER — Other Ambulatory Visit (HOSPITAL_COMMUNITY)
Admission: RE | Admit: 2019-04-02 | Discharge: 2019-04-02 | Disposition: A | Discharge status: Home / Self Care | Payer: Medicare HMO | Source: Ambulatory Visit | Attending: General Surgery | Admitting: General Surgery

## 2019-04-02 DIAGNOSIS — Z01812 Encounter for preprocedural laboratory examination: Secondary | ICD-10-CM | POA: Diagnosis present

## 2019-04-02 DIAGNOSIS — Z20828 Contact with and (suspected) exposure to other viral communicable diseases: Secondary | ICD-10-CM | POA: Insufficient documentation

## 2019-04-03 LAB — NOVEL CORONAVIRUS, NAA (HOSP ORDER, SEND-OUT TO REF LAB; TAT 18-24 HRS): SARS-CoV-2, NAA: NOT DETECTED

## 2019-04-05 ENCOUNTER — Encounter (HOSPITAL_COMMUNITY): Payer: Self-pay

## 2019-04-05 ENCOUNTER — Ambulatory Visit (HOSPITAL_COMMUNITY): Payer: Medicare HMO

## 2019-04-05 ENCOUNTER — Other Ambulatory Visit: Payer: Self-pay

## 2019-04-05 ENCOUNTER — Encounter (HOSPITAL_BASED_OUTPATIENT_CLINIC_OR_DEPARTMENT_OTHER)
Admission: RE | Admit: 2019-04-05 | Discharge: 2019-04-05 | Disposition: A | Discharge status: Home / Self Care | Payer: Medicare HMO | Source: Ambulatory Visit | Attending: General Surgery | Admitting: General Surgery

## 2019-04-05 DIAGNOSIS — Z01810 Encounter for preprocedural cardiovascular examination: Secondary | ICD-10-CM | POA: Insufficient documentation

## 2019-04-05 MED ORDER — ENSURE PRE-SURGERY PO LIQD: 296.0000 mL | Freq: Once | ORAL | Status: DC

## 2019-04-05 NOTE — Progress Notes (Signed)

## 2019-04-06 ENCOUNTER — Ambulatory Visit (HOSPITAL_BASED_OUTPATIENT_CLINIC_OR_DEPARTMENT_OTHER): Payer: Medicare HMO | Admitting: Anesthesiology

## 2019-04-06 ENCOUNTER — Encounter (HOSPITAL_BASED_OUTPATIENT_CLINIC_OR_DEPARTMENT_OTHER): Payer: Self-pay | Admitting: General Surgery

## 2019-04-06 ENCOUNTER — Ambulatory Visit (HOSPITAL_COMMUNITY)
Admission: RE | Admit: 2019-04-06 | Discharge: 2019-04-06 | Disposition: A | Discharge status: Home / Self Care | Payer: Medicare HMO | Source: Ambulatory Visit | Attending: General Surgery | Admitting: General Surgery

## 2019-04-06 ENCOUNTER — Encounter (HOSPITAL_BASED_OUTPATIENT_CLINIC_OR_DEPARTMENT_OTHER)
Admission: RE | Disposition: A | Discharge status: Home / Self Care | Payer: Self-pay | Source: Home / Self Care | Attending: General Surgery

## 2019-04-06 ENCOUNTER — Ambulatory Visit (HOSPITAL_BASED_OUTPATIENT_CLINIC_OR_DEPARTMENT_OTHER)
Admission: RE | Admit: 2019-04-06 | Discharge: 2019-04-06 | Disposition: A | Discharge status: Home / Self Care | Payer: Medicare HMO | Attending: General Surgery | Admitting: General Surgery

## 2019-04-06 ENCOUNTER — Other Ambulatory Visit: Payer: Self-pay

## 2019-04-06 DIAGNOSIS — I10 Essential (primary) hypertension: Secondary | ICD-10-CM | POA: Insufficient documentation

## 2019-04-06 DIAGNOSIS — Z8673 Personal history of transient ischemic attack (TIA), and cerebral infarction without residual deficits: Secondary | ICD-10-CM | POA: Insufficient documentation

## 2019-04-06 DIAGNOSIS — M1990 Unspecified osteoarthritis, unspecified site: Secondary | ICD-10-CM | POA: Insufficient documentation

## 2019-04-06 DIAGNOSIS — Z170 Estrogen receptor positive status [ER+]: Secondary | ICD-10-CM | POA: Diagnosis not present

## 2019-04-06 DIAGNOSIS — G8191 Hemiplegia, unspecified affecting right dominant side: Secondary | ICD-10-CM | POA: Insufficient documentation

## 2019-04-06 DIAGNOSIS — C50412 Malignant neoplasm of upper-outer quadrant of left female breast: Secondary | ICD-10-CM | POA: Insufficient documentation

## 2019-04-06 HISTORY — PX: BREAST LUMPECTOMY WITH AXILLARY LYMPH NODE BIOPSY: SHX5593

## 2019-04-06 SURGERY — BREAST LUMPECTOMY WITH AXILLARY LYMPH NODE BIOPSY
Anesthesia: General | Site: Breast | Laterality: Left

## 2019-04-06 MED ORDER — ONDANSETRON HCL 4 MG/2ML IJ SOLN
INTRAMUSCULAR | Status: DC | PRN
  Administered 2019-04-06: 4 mg via INTRAVENOUS

## 2019-04-06 MED ORDER — TECHNETIUM TC 99M SULFUR COLLOID FILTERED
1.0000 | Freq: Once | INTRAVENOUS | Status: AC | PRN
  Administered 2019-04-06: 1 via INTRADERMAL

## 2019-04-06 MED ORDER — EPHEDRINE SULFATE 50 MG/ML IJ SOLN
INTRAMUSCULAR | Status: DC | PRN
  Administered 2019-04-06 (×2): 10 mg via INTRAVENOUS

## 2019-04-06 MED ORDER — SUCCINYLCHOLINE CHLORIDE 20 MG/ML IJ SOLN
INTRAMUSCULAR | Status: DC | PRN
  Administered 2019-04-06: 100 mg via INTRAVENOUS

## 2019-04-06 MED ORDER — DEXAMETHASONE SODIUM PHOSPHATE 4 MG/ML IJ SOLN
INTRAMUSCULAR | Status: DC | PRN
  Administered 2019-04-06: 4 mg via INTRAVENOUS

## 2019-04-06 MED ORDER — MIDAZOLAM HCL 2 MG/2ML IJ SOLN: 1.0000 mg | INTRAMUSCULAR | Status: DC | PRN

## 2019-04-06 MED ORDER — LIDOCAINE 2% (20 MG/ML) 5 ML SYRINGE
INTRAMUSCULAR | Status: AC
  Filled 2019-04-06: qty 5

## 2019-04-06 MED ORDER — SODIUM CHLORIDE (PF) 0.9 % IJ SOLN
INTRAMUSCULAR | Status: DC | PRN
  Administered 2019-04-06: 1 mL

## 2019-04-06 MED ORDER — BUPIVACAINE-EPINEPHRINE (PF) 0.5% -1:200000 IJ SOLN
INTRAMUSCULAR | Status: DC | PRN
  Administered 2019-04-06: 20 mL

## 2019-04-06 MED ORDER — PROPOFOL 10 MG/ML IV BOLUS
INTRAVENOUS | Status: DC | PRN
  Administered 2019-04-06: 130 mg via INTRAVENOUS

## 2019-04-06 MED ORDER — FENTANYL CITRATE (PF) 100 MCG/2ML IJ SOLN
INTRAMUSCULAR | Status: AC
  Filled 2019-04-06: qty 2

## 2019-04-06 MED ORDER — CHLORHEXIDINE GLUCONATE CLOTH 2 % EX PADS: 6.0000 | MEDICATED_PAD | Freq: Once | CUTANEOUS | Status: DC

## 2019-04-06 MED ORDER — BUPIVACAINE HCL (PF) 0.25 % IJ SOLN
INTRAMUSCULAR | Status: DC | PRN
  Administered 2019-04-06: 10 mL

## 2019-04-06 MED ORDER — OXYCODONE HCL 5 MG PO TABS
5.0000 mg | ORAL_TABLET | Freq: Once | ORAL | Status: AC
  Administered 2019-04-06: 5 mg via ORAL

## 2019-04-06 MED ORDER — CEFAZOLIN SODIUM-DEXTROSE 2-4 GM/100ML-% IV SOLN
INTRAVENOUS | Status: AC
  Filled 2019-04-06: qty 100

## 2019-04-06 MED ORDER — OXYCODONE HCL 5 MG PO TABS: 2.5000 mg | ORAL_TABLET | Freq: Four times a day (QID) | ORAL | 0 refills | Status: DC | PRN

## 2019-04-06 MED ORDER — MIDAZOLAM HCL 2 MG/2ML IJ SOLN
INTRAMUSCULAR | Status: AC
  Filled 2019-04-06: qty 2

## 2019-04-06 MED ORDER — CEFAZOLIN SODIUM-DEXTROSE 2-4 GM/100ML-% IV SOLN
2.0000 g | INTRAVENOUS | Status: AC
  Administered 2019-04-06: 15:00:00 2 g via INTRAVENOUS

## 2019-04-06 MED ORDER — LIDOCAINE 2% (20 MG/ML) 5 ML SYRINGE
INTRAMUSCULAR | Status: DC | PRN
  Administered 2019-04-06: 40 mg via INTRAVENOUS

## 2019-04-06 MED ORDER — LIDOCAINE-EPINEPHRINE (PF) 1 %-1:200000 IJ SOLN
INTRAMUSCULAR | Status: AC
  Filled 2019-04-06: qty 30

## 2019-04-06 MED ORDER — LIDOCAINE-EPINEPHRINE (PF) 1 %-1:200000 IJ SOLN
INTRAMUSCULAR | Status: DC | PRN
  Administered 2019-04-06: 10 mL

## 2019-04-06 MED ORDER — METHYLENE BLUE 0.5 % INJ SOLN
INTRAVENOUS | Status: DC | PRN
  Administered 2019-04-06: 4 mL via SUBMUCOSAL

## 2019-04-06 MED ORDER — ACETAMINOPHEN 500 MG PO TABS
ORAL_TABLET | ORAL | Status: AC
  Filled 2019-04-06: qty 2

## 2019-04-06 MED ORDER — METHYLENE BLUE 0.5 % INJ SOLN
INTRAVENOUS | Status: AC
  Filled 2019-04-06: qty 10

## 2019-04-06 MED ORDER — ACETAMINOPHEN 500 MG PO TABS
1000.0000 mg | ORAL_TABLET | ORAL | Status: AC
  Administered 2019-04-06: 14:00:00 1000 mg via ORAL

## 2019-04-06 MED ORDER — LACTATED RINGERS IV SOLN: INTRAVENOUS | Status: DC

## 2019-04-06 MED ORDER — BUPIVACAINE HCL (PF) 0.25 % IJ SOLN
INTRAMUSCULAR | Status: AC
  Filled 2019-04-06: qty 30

## 2019-04-06 MED ORDER — FENTANYL CITRATE (PF) 100 MCG/2ML IJ SOLN
25.0000 ug | INTRAMUSCULAR | Status: DC | PRN
  Administered 2019-04-06: 25 ug via INTRAVENOUS

## 2019-04-06 MED ORDER — SODIUM CHLORIDE (PF) 0.9 % IJ SOLN
INTRAMUSCULAR | Status: AC
  Filled 2019-04-06: qty 10

## 2019-04-06 MED ORDER — OXYCODONE HCL 5 MG PO TABS
ORAL_TABLET | ORAL | Status: AC
  Filled 2019-04-06: qty 1

## 2019-04-06 MED ORDER — FENTANYL CITRATE (PF) 100 MCG/2ML IJ SOLN
50.0000 ug | INTRAMUSCULAR | Status: DC | PRN
  Administered 2019-04-06: 100 ug via INTRAVENOUS
  Administered 2019-04-06: 16:00:00 25 ug via INTRAVENOUS

## 2019-04-06 SURGICAL SUPPLY — 63 items
ADH SKN CLS APL DERMABOND .7 (GAUZE/BANDAGES/DRESSINGS) ×1
APL PRP STRL LF DISP 70% ISPRP (MISCELLANEOUS) ×1
BINDER BREAST LRG (GAUZE/BANDAGES/DRESSINGS) IMPLANT
BINDER BREAST MEDIUM (GAUZE/BANDAGES/DRESSINGS) ×2 IMPLANT
BINDER BREAST XLRG (GAUZE/BANDAGES/DRESSINGS) IMPLANT
BINDER BREAST XXLRG (GAUZE/BANDAGES/DRESSINGS) IMPLANT
BLADE SURG 10 STRL SS (BLADE) ×3 IMPLANT
BLADE SURG 15 STRL LF DISP TIS (BLADE) ×1 IMPLANT
BLADE SURG 15 STRL SS (BLADE) ×3
BNDG COHESIVE 4X5 TAN STRL (GAUZE/BANDAGES/DRESSINGS) ×3 IMPLANT
CANISTER SUCT 1200ML W/VALVE (MISCELLANEOUS) ×3 IMPLANT
CHLORAPREP W/TINT 26 (MISCELLANEOUS) ×3 IMPLANT
CLIP VESOCCLUDE LG 6/CT (CLIP) ×3 IMPLANT
CLIP VESOCCLUDE MED 6/CT (CLIP) ×3 IMPLANT
CLIP VESOCCLUDE SM WIDE 6/CT (CLIP) IMPLANT
CLOSURE WOUND 1/2 X4 (GAUZE/BANDAGES/DRESSINGS) ×1
COVER MAYO STAND REUSABLE (DRAPES) ×3 IMPLANT
COVER PROBE W GEL 5X96 (DRAPES) ×3 IMPLANT
COVER WAND RF STERILE (DRAPES) IMPLANT
DECANTER SPIKE VIAL GLASS SM (MISCELLANEOUS) IMPLANT
DERMABOND ADVANCED (GAUZE/BANDAGES/DRESSINGS) ×2
DERMABOND ADVANCED .7 DNX12 (GAUZE/BANDAGES/DRESSINGS) ×1 IMPLANT
DRAPE SURG 17X23 STRL (DRAPES) IMPLANT
DRAPE UTILITY XL STRL (DRAPES) ×3 IMPLANT
DRSG PAD ABDOMINAL 8X10 ST (GAUZE/BANDAGES/DRESSINGS) IMPLANT
ELECT COATED BLADE 2.86 ST (ELECTRODE) ×3 IMPLANT
ELECT REM PT RETURN 9FT ADLT (ELECTROSURGICAL) ×3
ELECTRODE REM PT RTRN 9FT ADLT (ELECTROSURGICAL) ×1 IMPLANT
GAUZE SPONGE 4X4 12PLY STRL (GAUZE/BANDAGES/DRESSINGS) IMPLANT
GAUZE SPONGE 4X4 12PLY STRL LF (GAUZE/BANDAGES/DRESSINGS) ×3 IMPLANT
GLOVE BIO SURGEON STRL SZ 6 (GLOVE) ×3 IMPLANT
GLOVE BIOGEL PI IND STRL 6.5 (GLOVE) ×1 IMPLANT
GLOVE BIOGEL PI INDICATOR 6.5 (GLOVE) ×2
GOWN STRL REUS W/ TWL LRG LVL3 (GOWN DISPOSABLE) ×1 IMPLANT
GOWN STRL REUS W/TWL 2XL LVL3 (GOWN DISPOSABLE) ×3 IMPLANT
GOWN STRL REUS W/TWL LRG LVL3 (GOWN DISPOSABLE) ×3
KIT MARKER MARGIN INK (KITS) ×3 IMPLANT
LIGHT WAVEGUIDE WIDE FLAT (MISCELLANEOUS) IMPLANT
NDL HYPO 25X1 1.5 SAFETY (NEEDLE) ×1 IMPLANT
NDL SAFETY ECLIPSE 18X1.5 (NEEDLE) IMPLANT
NEEDLE HYPO 18GX1.5 SHARP (NEEDLE)
NEEDLE HYPO 25X1 1.5 SAFETY (NEEDLE) ×3 IMPLANT
NS IRRIG 1000ML POUR BTL (IV SOLUTION) ×3 IMPLANT
PACK BASIN DAY SURGERY FS (CUSTOM PROCEDURE TRAY) ×3 IMPLANT
PACK UNIVERSAL I (CUSTOM PROCEDURE TRAY) ×3 IMPLANT
PENCIL SMOKE EVACUATOR (MISCELLANEOUS) ×3 IMPLANT
SLEEVE SCD COMPRESS KNEE MED (MISCELLANEOUS) IMPLANT
SPONGE LAP 18X18 RF (DISPOSABLE) ×3 IMPLANT
SPONGE LAP 4X18 RFD (DISPOSABLE) IMPLANT
STOCKINETTE IMPERVIOUS LG (DRAPES) ×3 IMPLANT
STRIP CLOSURE SKIN 1/2X4 (GAUZE/BANDAGES/DRESSINGS) ×2 IMPLANT
SUT MON AB 4-0 PC3 18 (SUTURE) ×3 IMPLANT
SUT VIC AB 2-0 SH 27 (SUTURE)
SUT VIC AB 2-0 SH 27XBRD (SUTURE) IMPLANT
SUT VIC AB 3-0 SH 27 (SUTURE) ×6
SUT VIC AB 3-0 SH 27X BRD (SUTURE) ×2 IMPLANT
SYR BULB 3OZ (MISCELLANEOUS) IMPLANT
SYR CONTROL 10ML LL (SYRINGE) ×3 IMPLANT
TOWEL GREEN STERILE FF (TOWEL DISPOSABLE) ×3 IMPLANT
TRAY FAXITRON CT DISP (TRAY / TRAY PROCEDURE) IMPLANT
TUBE CONNECTING 20'X1/4 (TUBING) ×1
TUBE CONNECTING 20X1/4 (TUBING) ×2 IMPLANT
YANKAUER SUCT BULB TIP NO VENT (SUCTIONS) ×3 IMPLANT

## 2019-04-06 NOTE — Progress Notes (Signed)
Assisted Dr. Carswell Jackson with left, ultrasound guided, pectoralis block. Side rails up, monitors on throughout procedure. See vital signs in flow sheet. Tolerated Procedure well. 

## 2019-04-06 NOTE — Interval H&P Note (Signed)
History and Physical Interval Note:  04/06/2019 2:42 PM  [name]  has presented today for surgery, with the diagnosis of LEFT BREAST CANCER.  The various methods of treatment have been discussed with the patient and family. After consideration of risks, benefits and other options for treatment, the patient has consented to  Procedure(s): LEFT BREAST LUMPECTOMY WITH LEFT SENTINEL LYMPH NODE BIOPSY (Left) as a surgical intervention.  The patient's history has been reviewed, patient examined, no change in status, stable for surgery.  I have reviewed the patient's chart and labs.  Questions were answered to the patient's satisfaction.     Nicole Rush

## 2019-04-06 NOTE — Discharge Instructions (Addendum)
Next Dose of Tylenol at 7:30 PM   [place] Office Phone Number [phone]  BREAST BIOPSY/ PARTIAL MASTECTOMY: POST OP INSTRUCTIONS  Always review your discharge instruction sheet given to you by the facility where your surgery was performed.  IF YOU HAVE DISABILITY OR FAMILY LEAVE FORMS, YOU MUST BRING THEM TO THE OFFICE FOR PROCESSING.  DO NOT GIVE THEM TO YOUR DOCTOR.  1. A prescription for pain medication may be given to you upon discharge.  Take your pain medication as prescribed, if needed.  If narcotic pain medicine is not needed, then you may take acetaminophen (Tylenol) or ibuprofen (Advil) as needed. 2. Take your usually prescribed medications unless otherwise directed 3. If you need a refill on your pain medication, please contact your pharmacy.  They will contact our office to request authorization.  Prescriptions will not be filled after 5pm or on week-ends. 4. You should eat very light the first 24 hours after surgery, such as soup, crackers, pudding, etc.  Resume your normal diet the day after surgery. 5. Most patients will experience some swelling and bruising in the breast.  Ice packs and a good support bra will help.  Swelling and bruising can take several days to resolve.  6. It is common to experience some constipation if taking pain medication after surgery.  Increasing fluid intake and taking a stool softener will usually help or prevent this problem from occurring.  A mild laxative (Milk of Magnesia or Miralax) should be taken according to package directions if there are no bowel movements after 48 hours. 7. Unless discharge instructions indicate otherwise, you may remove your bandages 48 hours after surgery, and you may shower at that time.  You may have steri-strips (small skin tapes) in place directly over the incision.  These strips should be left on the skin for 7-10 days.   Any sutures or staples will be removed at the office during your follow-up  visit. 8. ACTIVITIES:  You may resume regular daily activities (gradually increasing) beginning the next day.  Wearing a good support bra or sports bra (or the breast binder) minimizes pain and swelling.  You may have sexual intercourse when it is comfortable. a. You may drive when you no longer are taking prescription pain medication, you can comfortably wear a seatbelt, and you can safely maneuver your car and apply brakes. b. RETURN TO WORK:  __________1 week_______________ 9. You should see your doctor in the office for a follow-up appointment approximately two weeks after your surgery.  Your doctor's nurse will typically make your follow-up appointment when she calls you with your pathology report.  Expect your pathology report 2-3 business days after your surgery.  You may call to check if you do not hear from [place] after three days.   WHEN TO CALL YOUR DOCTOR: 1. Fever over 101.0 2. Nausea and/or vomiting. 3. Extreme swelling or bruising. 4. Continued bleeding from incision. 5. Increased pain, redness, or drainage from the incision.  The clinic staff is available to answer your questions during regular business hours.  Please don't hesitate to call and ask to speak to one of the nurses for clinical concerns.  If you have a medical emergency, go to the nearest emergency room or call 911.  A surgeon from [facility] is always on call at the hospital.  For further questions, please visit [email or web address]      Post Anesthesia Home Care Instructions  Activity: Get plenty of rest for the remainder of  the day. A responsible individual must stay with you for 24 hours following the procedure.  For the next 24 hours, DO NOT: -Drive a car -Paediatric nurse -Drink alcoholic beverages -Take any medication unless instructed by your physician -Make any legal decisions or sign important papers.  Meals: Start with liquid foods such as gelatin or soup. Progress to regular  foods as tolerated. Avoid greasy, spicy, heavy foods. If nausea and/or vomiting occur, drink only clear liquids until the nausea and/or vomiting subsides. Call your physician if vomiting continues.  Special Instructions/Symptoms: Your throat may feel dry or sore from the anesthesia or the breathing tube placed in your throat during surgery. If this causes discomfort, gargle with warm salt water. The discomfort should disappear within 24 hours.  If you had a scopolamine patch placed behind your ear for the management of post- operative nausea and/or vomiting:  1. The medication in the patch is effective for 72 hours, after which it should be removed.  Wrap patch in a tissue and discard in the trash. Wash hands thoroughly with soap and water. 2. You may remove the patch earlier than 72 hours if you experience unpleasant side effects which may include dry mouth, dizziness or visual disturbances. 3. Avoid touching the patch. Wash your hands with soap and water after contact with the patch.     Regional Anesthesia Blocks  1. Numbness or the inability to move the "blocked" extremity may last from 3-48 hours after placement. The length of time depends on the medication injected and your individual response to the medication. If the numbness is not going away after 48 hours, call your surgeon.  2. The extremity that is blocked will need to be protected until the numbness is gone and the  Strength has returned. Because you cannot feel it, you will need to take extra care to avoid injury. Because it may be weak, you may have difficulty moving it or using it. You may not know what position it is in without looking at it while the block is in effect.  3. For blocks in the legs and feet, returning to weight bearing and walking needs to be done carefully. You will need to wait until the numbness is entirely gone and the strength has returned. You should be able to move your leg and foot normally before you try  and bear weight or walk. You will need someone to be with you when you first try to ensure you do not fall and possibly risk injury.  4. Bruising and tenderness at the needle site are common side effects and will resolve in a few days.  5. Persistent numbness or new problems with movement should be communicated to the surgeon or the [place] [phone] [place] [phone]).

## 2019-04-06 NOTE — Op Note (Signed)
Left Breast Lumpectomy with Sentinel Node Mapping and Biopsy Procedure Note  Indications: This patient presents with history of left breast cancer with clinically negative axillary lymph node exam.  Pre-operative Diagnosis: left breast cancer, UOQ, ER+/PR+/Her 2-, cT1cN0  Post-operative Diagnosis: left breast cancer  Surgeon: [name]   Assistants: n/a  Anesthesia: General LMA anesthesia and and pectoral block  ASA Class: 3  Procedure Details  The patient was seen in the Holding Room. The risks, benefits, complications, treatment options, and expected outcomes were discussed with the patient. The possibilities of reaction to medication, pulmonary aspiration, bleeding, infection, the need for additional procedures, failure to diagnose a condition, and creating a complication requiring transfusion or operation were discussed with the patient. The patient concurred with the proposed plan, giving informed consent.  The site of surgery properly noted/marked. The patient was taken to Operating Room # 3, identified as Nicole Rush and the procedure verified as Left Breast Lumpectomy and Sentinel Node Biopsy. A Time Out was held and the above information confirmed. Methylene blue was injected in the subareolar location  After induction of anesthesia, the left arm, breast, and chest were prepped and draped in standard fashion.  Using a hand-held gamma probe, axillary sentinel nodes were identified transcutaneously.  An oblique incision was created below the axillary hairline.  Dissection was carried through the clavipectoral fascia.  Two deep level 2 axillary sentinel nodes were removed. Lymphovascular channels were clipped.  The wound was irrigated.  Hemostasis was achieved with cautery and clips.    Skin hooks were used to elevate the skin and the cautery was used to dissect around the mass.  Dissection was carried down to the pectoral fascia.  The specimen was marked with the margin marker  paint kit.  The specimen was placed in the faxatron to confirm presence on the biopsy clip.  Hemostasis was achieved with cautery.  Large clips were placed on the specimen cavity edges for radiation.   The wound was irrigated and closed with a 3-0 Vicryl deep dermal interrupted and a 4-0 Monocryl subcuticular closure in layers.  The axillary incision was closed with 3-0 vicryl deep dermal interrupted sutures and 4-0 monocryl subcuticular closure in layers.      Sterile dressings were applied. At the end of the operation, all sponge, instrument, and needle counts were correct.  Findings: grossly clear surgical margins and no lymphadenopathy.  Anterior margin is skin, posterior margin is pectoralis.  SLN #1 hot, cps 293; SLN #2 palpable.    Estimated Blood Loss:  Minimal         Specimens: two left sentinel nodes and a left breast lumpectomy                Complications:  None; patient tolerated the procedure well.         Disposition: PACU - hemodynamically stable.         Condition: stable

## 2019-04-06 NOTE — Anesthesia Procedure Notes (Signed)
Anesthesia Regional Block: Pectoralis block   Pre-Anesthetic Checklist: ,, timeout performed, Correct Patient, Correct Site, Correct Laterality, Correct Procedure, Correct Position, site marked, Risks and benefits discussed,  Surgical consent,  Pre-op evaluation,  At surgeon's request and post-op pain management  Laterality: Left  Prep: chloraprep       Needles:  Injection technique: Single-shot  Needle Type: Echogenic Needle     Needle Length: 9cm  Needle Gauge: 21     Additional Needles:   Procedures:,,,, ultrasound used (permanent image in chart),,,,  Narrative:  Start time: 04/06/2019 2:03 PM End time: 04/06/2019 2:11 PM Injection made incrementally with aspirations every 5 mL.  Performed by: Personally  Anesthesiologist: Annye Asa, MD  Additional Notes: Pt identified in Holding room.  Monitors applied. Working IV access confirmed. Sterile prep, drape L clavicle and pec.  #21ga ECHOgenic needle in between pec maj and serratus, between ribs 4,5 with US guidance.  20cc 0.5% Bupivacaine with 1:200k epi injected incrementally after negative test dose.  Patient asymptomatic, VSS, no heme aspirated, tolerated well.  Jenita Seashore, MD

## 2019-04-06 NOTE — Anesthesia Procedure Notes (Signed)
Procedure Name: Intubation Date/Time: 04/06/2019 3:10 PM Performed by: Maryella Shivers, CRNA Pre-anesthesia Checklist: Patient identified, Emergency Drugs available, Suction available and Patient being monitored Patient Re-evaluated:Patient Re-evaluated prior to induction Oxygen Delivery Method: Circle system utilized Preoxygenation: Pre-oxygenation with 100% oxygen Induction Type: IV induction Ventilation: Mask ventilation without difficulty Laryngoscope Size: Mac and 3 Grade View: Grade I Tube type: Oral Tube size: 7.0 mm Number of attempts: 1 Airway Equipment and Method: Stylet and Oral airway Placement Confirmation: ETT inserted through vocal cords under direct vision,  positive ETCO2 and breath sounds checked- equal and bilateral Secured at: 19 cm Tube secured with: Tape Dental Injury: Teeth and Oropharynx as per pre-operative assessment

## 2019-04-06 NOTE — Anesthesia Preprocedure Evaluation (Addendum)
Anesthesia Evaluation  Patient identified by MRN, date of birth, ID band Patient awake    Reviewed: Allergy & Precautions, NPO status , Patient's Chart, lab work & pertinent test results  History of Anesthesia Complications Negative for: history of anesthetic complications  Airway Mallampati: I  TM Distance: >3 FB Neck ROM: Full    Dental  (+) Edentulous Upper, Edentulous Lower   Pulmonary former smoker,  04/02/2019 SARS CoV2 NEG   breath sounds clear to auscultation       Cardiovascular hypertension, Pt. on medications (-) angina Rhythm:Regular Rate:Normal     Neuro/Psych Anxiety CVA (R arm weakness), Residual Symptoms    GI/Hepatic Neg liver ROS, GERD  Controlled,  Endo/Other  negative endocrine ROS  Renal/GU negative Renal ROS     Musculoskeletal   Abdominal   Peds  Hematology negative hematology ROS (+)   Anesthesia Other Findings   Reproductive/Obstetrics                            Anesthesia Physical Anesthesia Plan  ASA: III  Anesthesia Plan: General   Post-op Pain Management: GA combined w/ Regional for post-op pain   Induction: Intravenous  PONV Risk Score and Plan: 3 and Ondansetron, Dexamethasone and Treatment may vary due to age or medical condition  Airway Management Planned: LMA  Additional Equipment:   Intra-op Plan:   Post-operative Plan:   Informed Consent: I have reviewed the patients History and Physical, chart, labs and discussed the procedure including the risks, benefits and alternatives for the proposed anesthesia with the patient or authorized representative who has indicated his/her understanding and acceptance.     Dental advisory given  Plan Discussed with: CRNA and Surgeon  Anesthesia Plan Comments: (Plan routine monitors, GA, PEC block for post op analgesia)        Anesthesia Quick Evaluation

## 2019-04-06 NOTE — Transfer of Care (Signed)
Immediate Anesthesia Transfer of Care Note  Patient: Nicole Rush  Procedure(s) Performed: LEFT BREAST LUMPECTOMY WITH LEFT SENTINEL LYMPH NODE BIOPSY (Left Breast)  Patient Location: PACU  Anesthesia Type:General  Level of Consciousness: awake, alert  and oriented  Airway & Oxygen Therapy: Patient Spontanous Breathing and Patient connected to face mask oxygen  Post-op Assessment: Report given to RN and Post -op Vital signs reviewed and stable  Post vital signs: Reviewed and stable  Last Vitals:  Vitals Value Taken Time  BP 151/75 04/06/19 1625  Temp    Pulse 101 04/06/19 1627  Resp    SpO2 100 % 04/06/19 1627  Vitals shown include unvalidated device data.  Last Pain:  Vitals:   04/06/19 1325  TempSrc: Oral  PainSc: 0-No pain      Patients Stated Pain Goal: 5 ([id number])  Complications: No apparent anesthesia complications

## 2019-04-06 NOTE — Anesthesia Postprocedure Evaluation (Signed)
Anesthesia Post Note  Patient: Nicole Rush  Procedure(s) Performed: LEFT BREAST LUMPECTOMY WITH LEFT SENTINEL LYMPH NODE BIOPSY (Left Breast)     Patient location during evaluation: PACU Anesthesia Type: General Level of consciousness: awake and alert and oriented Pain management: pain level controlled Vital Signs Assessment: post-procedure vital signs reviewed and stable Respiratory status: spontaneous breathing, nonlabored ventilation and respiratory function stable Cardiovascular status: blood pressure returned to baseline and stable Postop Assessment: no apparent nausea or vomiting Anesthetic complications: no    Last Vitals:  Vitals:   04/06/19 1705 04/06/19 1730  BP: (!) 154/77 (!) 162/78  Pulse: 81 83  Resp: (!) 22 20  Temp:  (!) 36.4 C  SpO2: 96% 95%    Last Pain:  Vitals:   04/06/19 1730  TempSrc:   PainSc: 4                  [name] A.

## 2019-04-06 NOTE — Progress Notes (Signed)
Nuc med inj performed by nuc med staff. Pt tol well with no additional sedation. VSS, see flowsheet. Emotional support provided to pt

## 2019-04-07 ENCOUNTER — Encounter: Payer: Self-pay | Admitting: *Deleted

## 2019-04-11 LAB — SURGICAL PATHOLOGY

## 2019-04-12 ENCOUNTER — Encounter: Payer: Self-pay | Admitting: *Deleted

## 2019-04-12 NOTE — Progress Notes (Signed)
Patient Care Team: Lin Landsman, MD as PCP - General (Family Medicine) Rockwell Germany, RN as Oncology Nurse Navigator Tressie Ellis, Paulette Blanch, RN as Oncology Nurse Navigator Nicholas Lose, MD as Consulting Physician (Hematology and Oncology) Kyung Rudd, MD as Consulting Physician (Radiation Oncology) [name], MD as Consulting Physician (General Surgery)  DIAGNOSIS:    ICD-10-CM   1. Malignant neoplasm of upper-outer quadrant of left breast in female, estrogen receptor positive ([place])  C50.412    Z17.0     SUMMARY OF ONCOLOGIC HISTORY: Oncology History  Malignant neoplasm of upper-outer quadrant of left breast in female, estrogen receptor positive ([place])  03/22/2019 Initial Diagnosis   Patient palpated a left breast mass. Mammogram showed two cysts in the right breast. In the left breast, a 1.3cm mass at the 2 o'clock position with associated calcifications spanning in total 2.0cm, no abnormal lymph nodes. 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    04/06/2019 Surgery   Left lumpectomy ([facility]): IDC, grade 2, 1.4cm, with intermediate grade DCIS, medial margin involved by DCIS, 2 left axillary lymph nodes negative   04/13/2019 Cancer Staging   Staging form: Breast, AJCC 8th Edition - Pathologic stage from 04/13/2019: Stage IA (pT1c, pN0, cM0, G2, ER+, PR+, HER2-) - Signed by Nicholas Lose, MD on 04/13/2019     CHIEF COMPLIANT: Follow-up s/p lumpectomy to discuss pathology report  INTERVAL HISTORY: Nicole Rush is a 78 y.o. with above-mentioned history of left breast cancer. She underwent a left lumpectomy on 04/06/19 with Dr. 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 She presents to the clinic today to discuss the pathology report and further treatment.   REVIEW OF SYSTEMS:    Constitutional: Denies fevers, chills or abnormal weight loss Eyes: Denies blurriness of vision Ears, nose, mouth, throat, and face: Denies mucositis or sore throat Respiratory: Denies cough, dyspnea or wheezes Cardiovascular: Denies palpitation, chest discomfort Gastrointestinal: Denies nausea, heartburn or change in bowel habits Skin: Denies abnormal skin rashes Lymphatics: Denies new lymphadenopathy or easy bruising Neurological: Denies numbness, tingling or new weaknesses Behavioral/Psych: Mood is stable, no new changes  Extremities: No lower extremity edema Breast: denies any pain or lumps or nodules in either breasts All other systems were reviewed with the patient and are negative.  I have reviewed the past medical history, past surgical history, social history and family history with the patient and they are unchanged from previous note.  ALLERGIES:  has No Known Allergies.  MEDICATIONS:  Current Outpatient Medications  Medication Sig Dispense Refill  . anastrozole (ARIMIDEX) 1 MG tablet Take 1 tablet (1 mg total) by mouth daily. 90 tablet 3  . loratadine (CLARITIN) 10 MG tablet Take 10 mg by mouth daily.    Marland Kitchen losartan-hydrochlorothiazide (HYZAAR) 100-25 MG per tablet Take 1 tablet by mouth daily.    Marland Kitchen oxyCODONE (OXY IR/ROXICODONE) 5 MG immediate release tablet Take 0.5-1 tablets (2.5-5 mg total) by mouth every 6 (six) hours as needed for severe pain. 15 tablet 0   No current facility-administered medications for this visit.    PHYSICAL EXAMINATION: ECOG PERFORMANCE STATUS: 1 - Symptomatic but completely ambulatory  Vitals:   04/13/19 1519  BP: (!) 146/87  Pulse: 77  Resp: 19  Temp: 98.5 F (36.9 C)  SpO2: 99%   Filed Weights   04/13/19 1519  Weight:  104 lb 1.6 oz (47.2 kg)    GENERAL: alert, no distress and comfortable SKIN: skin color, texture, turgor are normal, no rashes or significant lesions EYES: normal, Conjunctiva are pink and non-injected, sclera  clear OROPHARYNX: no exudate, no erythema and lips, buccal mucosa, and tongue normal  NECK: supple, thyroid normal size, non-tender, without nodularity LYMPH: no palpable lymphadenopathy in the cervical, axillary or inguinal LUNGS: clear to auscultation and percussion with normal breathing effort HEART: regular rate & rhythm and no murmurs and no lower extremity edema ABDOMEN: abdomen soft, non-tender and normal bowel sounds MUSCULOSKELETAL: no cyanosis of digits and no clubbing  NEURO: alert & oriented x 3 with fluent speech, no focal motor/sensory deficits EXTREMITIES: No lower extremity edema  LABORATORY DATA:  I have reviewed the data as listed CMP Latest Ref Rng & Units 03/23/2019 08/11/2012 08/11/2012  Glucose 70 - 99 mg/dL 89 109(H) 88  BUN 8 - 23 mg/dL 25(H) 15 19  Creatinine 0.44 - 1.00 mg/dL 0.77 0.87 0.89  Sodium 135 - 145 mmol/L 140 137 138  Potassium 3.5 - 5.1 mmol/L 4.0 4.0 3.1(L)  Chloride 98 - 111 mmol/L 106 102 100  CO2 22 - 32 mmol/L 25 26 28   Calcium 8.9 - 10.3 mg/dL 9.0 9.3 9.1  Total Protein 6.5 - 8.1 g/dL 7.2 - -  Total Bilirubin 0.3 - 1.2 mg/dL 0.4 - -  Alkaline Phos 38 - 126 U/L 60 - -  AST 15 - 41 U/L 21 - -  ALT 0 - 44 U/L 21 - -    Lab Results  Component Value Date   WBC 5.9 03/23/2019   HGB 11.2 (L) 03/23/2019   HCT 33.8 (L) 03/23/2019   MCV 80.3 03/23/2019   PLT 312 03/23/2019   NEUTROABS 3.5 03/23/2019    ASSESSMENT & PLAN:  Malignant neoplasm of upper-outer quadrant of left breast in female, estrogen receptor positive ([place]) 04/06/2019:Left lumpectomy ([facility]): IDC, grade 2, 1.4cm, with intermediate grade DCIS, medial margin involved by DCIS, 2 left axillary lymph nodes negative ER 95%, PR 90%, Ki-67 10%, HER-2 -1+ T1c N0 stage Ia  Pathology counseling: I discussed the final pathology report of the patient provided  a copy of this report. I discussed the margins as well as lymph node surgeries. We also discussed the final staging along with  previously performed ER/PR and HER-2/neu testing.  Treatment plan: Given the favorable prognostic markers and her age, radiation therapy is an optional treatment.  She will meet with radiation oncology and make a final determination. We will start her on adjuvant antiestrogen therapy with anastrozole 1 mg daily.  Anastrozole counseling:We discussed the risks and benefits of anti-estrogen therapy with aromatase inhibitors. These include but not limited to insomnia, hot flashes, mood changes, vaginal dryness, bone density loss, and weight gain. We strongly believe that the benefits far outweigh the risks. Patient understands these risks and consented to starting treatment. Planned treatment duration is 5 years.   Return to clinic in 3 months for survivorship care plan visit    No orders of the defined types were placed in this encounter.  The patient has a good understanding of the overall plan. she agrees with it. she will call with any problems that may develop before the next visit here.  Nicholas Lose, MD 04/13/2019  Julious Oka Dorshimer, am acting as scribe for Dr. Nicholas Lose.  I have reviewed the above document for accuracy and completeness, and I agree with the above.

## 2019-04-13 ENCOUNTER — Other Ambulatory Visit: Payer: Self-pay

## 2019-04-13 ENCOUNTER — Telehealth: Payer: Self-pay

## 2019-04-13 ENCOUNTER — Inpatient Hospital Stay (HOSPITAL_BASED_OUTPATIENT_CLINIC_OR_DEPARTMENT_OTHER): Payer: Medicare HMO | Admitting: Hematology and Oncology

## 2019-04-13 DIAGNOSIS — Z170 Estrogen receptor positive status [ER+]: Secondary | ICD-10-CM | POA: Diagnosis not present

## 2019-04-13 DIAGNOSIS — C50412 Malignant neoplasm of upper-outer quadrant of left female breast: Secondary | ICD-10-CM

## 2019-04-13 MED ORDER — ANASTROZOLE 1 MG PO TABS: 1.0000 mg | ORAL_TABLET | Freq: Every day | ORAL | 3 refills | Status: DC

## 2019-04-13 NOTE — Assessment & Plan Note (Signed)
04/06/2019:Left lumpectomy ([facility]): IDC, grade 2, 1.4cm, with intermediate grade DCIS, medial margin involved by DCIS, 2 left axillary lymph nodes negative ER 95%, PR 90%, Ki-67 10%, HER-2 -1+ T1c N0 stage Ia  Pathology counseling: I discussed the final pathology report of the patient provided  a copy of this report. I discussed the margins as well as lymph node surgeries. We also discussed the final staging along with previously performed ER/PR and HER-2/neu testing.  Treatment plan: Given the favorable prognostic markers and her age, radiation therapy is an optional treatment.  She will meet with radiation oncology and make a final determination. We will start her on adjuvant antiestrogen therapy with anastrozole 1 mg daily.  Anastrozole counseling:We discussed the risks and benefits of anti-estrogen therapy with aromatase inhibitors. These include but not limited to insomnia, hot flashes, mood changes, vaginal dryness, bone density loss, and weight gain. We strongly believe that the benefits far outweigh the risks. Patient understands these risks and consented to starting treatment. Planned treatment duration is 5 years.   Return to clinic in 3 months for survivorship care plan visit

## 2019-04-13 NOTE — Telephone Encounter (Signed)
Nutrition Assessment  Reason for Assessment:  Pt attended Breast Clinic on 03/23/2019 and was given breast packet by nurse navigator.  ASSESSMENT:  78 year old female with new diagnosis of breast cancer.  S/p lumpectomy.  Planning possible radiation and antiestrogens.  Past medical history of HTN, asthma, stroke.  Spoke with patient via phone to introduce self and service at [facility].  Patient reports that her appetite is good.   Medications:  reviewed  Labs: reviewed  Anthropometrics:   Height: 60 inches Weight: 105 lb BMI: 20   NUTRITION DIAGNOSIS: Food and nutrition related knowledge deficit related to new diagnosis of breast cancer as evidenced by no prior need for nutrition related information.  INTERVENTION:   Discussed briefly packet of information regarding nutritional tips for breast cancer patients. No questions at this time.  Contact information provided and patient knows to contact me with questions/concerns.    MONITORING, EVALUATION, and GOAL: Pt will consume a healthy plant based diet to maintain lean body mass throughout treatment.   Molley Houser B. Zenia Resides, [place], [place] Registered Dietitian [phone] (pager)

## 2019-04-14 ENCOUNTER — Telehealth: Payer: Self-pay | Admitting: Hematology and Oncology

## 2019-04-14 NOTE — Telephone Encounter (Signed)
I could not reach regarding scp

## 2019-04-18 ENCOUNTER — Telehealth: Payer: Self-pay

## 2019-04-18 ENCOUNTER — Ambulatory Visit (HOSPITAL_COMMUNITY)
Admission: RE | Admit: 2019-04-18 | Discharge: 2019-04-18 | Disposition: A | Discharge status: Home / Self Care | Payer: Medicare HMO | Source: Ambulatory Visit | Attending: General Surgery | Admitting: General Surgery

## 2019-04-18 ENCOUNTER — Telehealth: Payer: Self-pay | Admitting: General Surgery

## 2019-04-18 ENCOUNTER — Other Ambulatory Visit: Payer: Self-pay

## 2019-04-18 ENCOUNTER — Other Ambulatory Visit: Payer: Self-pay | Admitting: General Surgery

## 2019-04-18 DIAGNOSIS — Z170 Estrogen receptor positive status [ER+]: Secondary | ICD-10-CM | POA: Insufficient documentation

## 2019-04-18 DIAGNOSIS — C50412 Malignant neoplasm of upper-outer quadrant of left female breast: Secondary | ICD-10-CM

## 2019-04-18 MED ORDER — GADOBUTROL 1 MMOL/ML IV SOLN: 5.0000 mL | Freq: Once | INTRAVENOUS | Status: DC | PRN

## 2019-04-18 NOTE — Telephone Encounter (Signed)
Discussed positive margin with patient.  Will set up for reexcision.

## 2019-04-18 NOTE — Telephone Encounter (Signed)
RN was notified by MRI department of unsuccessful IV attempts today prior to MRI.     MRI has been re-scheduled for 12/29 @ 9am.   RN spoke with charge nurse, [place] for patient to come in at 8am for attempt at IV access.  RN notified patient, patient voiced understanding on hydrating with fluids today at home. MRI department notified.

## 2019-04-19 ENCOUNTER — Telehealth: Payer: Self-pay | Admitting: *Deleted

## 2019-04-19 ENCOUNTER — Inpatient Hospital Stay: Payer: Medicare HMO

## 2019-04-19 ENCOUNTER — Ambulatory Visit (HOSPITAL_COMMUNITY)
Admission: RE | Admit: 2019-04-19 | Discharge: 2019-04-19 | Disposition: A | Discharge status: Home / Self Care | Payer: Medicare HMO | Source: Ambulatory Visit | Attending: General Surgery | Admitting: General Surgery

## 2019-04-19 DIAGNOSIS — C50412 Malignant neoplasm of upper-outer quadrant of left female breast: Secondary | ICD-10-CM | POA: Diagnosis present

## 2019-04-19 DIAGNOSIS — Z170 Estrogen receptor positive status [ER+]: Secondary | ICD-10-CM | POA: Insufficient documentation

## 2019-04-19 MED ORDER — GADOBUTROL 1 MMOL/ML IV SOLN
4.0000 mL | Freq: Once | INTRAVENOUS | Status: AC | PRN
  Administered 2019-04-19: 4 mL via INTRAVENOUS

## 2019-04-19 NOTE — Telephone Encounter (Signed)
IV start this am for MRI.

## 2019-04-26 ENCOUNTER — Ambulatory Visit: Payer: Medicare HMO | Admitting: Radiation Oncology

## 2019-04-28 ENCOUNTER — Encounter: Payer: Self-pay | Admitting: *Deleted

## 2019-04-29 ENCOUNTER — Other Ambulatory Visit: Payer: Self-pay

## 2019-04-29 ENCOUNTER — Encounter (HOSPITAL_BASED_OUTPATIENT_CLINIC_OR_DEPARTMENT_OTHER): Payer: Self-pay | Admitting: General Surgery

## 2019-04-30 ENCOUNTER — Other Ambulatory Visit (HOSPITAL_COMMUNITY)
Admission: RE | Admit: 2019-04-30 | Discharge: 2019-04-30 | Disposition: A | Discharge status: Home / Self Care | Payer: Medicare HMO | Source: Ambulatory Visit | Attending: General Surgery | Admitting: General Surgery

## 2019-04-30 DIAGNOSIS — Z20822 Contact with and (suspected) exposure to covid-19: Secondary | ICD-10-CM | POA: Diagnosis not present

## 2019-04-30 DIAGNOSIS — Z01812 Encounter for preprocedural laboratory examination: Secondary | ICD-10-CM | POA: Insufficient documentation

## 2019-05-01 LAB — NOVEL CORONAVIRUS, NAA (HOSP ORDER, SEND-OUT TO REF LAB; TAT 18-24 HRS): SARS-CoV-2, NAA: NOT DETECTED

## 2019-05-02 ENCOUNTER — Other Ambulatory Visit: Payer: Self-pay

## 2019-05-02 ENCOUNTER — Encounter (HOSPITAL_BASED_OUTPATIENT_CLINIC_OR_DEPARTMENT_OTHER)
Admission: RE | Admit: 2019-05-02 | Discharge: 2019-05-02 | Disposition: A | Discharge status: Home / Self Care | Payer: Medicare HMO | Source: Ambulatory Visit | Attending: General Surgery | Admitting: General Surgery

## 2019-05-02 ENCOUNTER — Encounter: Payer: Self-pay | Admitting: Physical Therapy

## 2019-05-02 ENCOUNTER — Ambulatory Visit: Payer: Medicare HMO | Attending: General Surgery | Admitting: Physical Therapy

## 2019-05-02 DIAGNOSIS — Z01810 Encounter for preprocedural cardiovascular examination: Secondary | ICD-10-CM | POA: Insufficient documentation

## 2019-05-02 DIAGNOSIS — C50412 Malignant neoplasm of upper-outer quadrant of left female breast: Secondary | ICD-10-CM | POA: Insufficient documentation

## 2019-05-02 DIAGNOSIS — Z483 Aftercare following surgery for neoplasm: Secondary | ICD-10-CM | POA: Diagnosis present

## 2019-05-02 DIAGNOSIS — R293 Abnormal posture: Secondary | ICD-10-CM | POA: Diagnosis present

## 2019-05-02 DIAGNOSIS — Z170 Estrogen receptor positive status [ER+]: Secondary | ICD-10-CM | POA: Diagnosis present

## 2019-05-02 LAB — BASIC METABOLIC PANEL
Anion gap: 8 (ref 5–15)
BUN: 22 mg/dL (ref 8–23)
CO2: 25 mmol/L (ref 22–32)
Calcium: 9.5 mg/dL (ref 8.9–10.3)
Chloride: 106 mmol/L (ref 98–111)
Creatinine, Ser: 0.65 mg/dL (ref 0.44–1.00)
GFR calc Af Amer: >60 mL/min (ref >60)
GFR calc non Af Amer: >60 mL/min (ref >60)
Glucose, Bld: 89 mg/dL (ref 70–99)
Potassium: 3.8 mmol/L (ref 3.5–5.1)
Sodium: 139 mmol/L (ref 135–145)

## 2019-05-02 MED ORDER — ENSURE PRE-SURGERY PO LIQD: 296.0000 mL | Freq: Once | ORAL | Status: DC

## 2019-05-02 NOTE — Therapy (Signed)
[place], [place], [address] Phone: [phone]   Fax:  [phone]  Physical Therapy Treatment  Patient Details  Name: Nicole Rush MRN: [id number] Date of Birth: [birth year] Referring Provider (PT): Dr. [name]   Encounter Date: 05/02/2019  PT End of Session - 05/02/19 1523    Visit Number  2    Number of Visits  2    PT Start Time  1440    PT Stop Time  1607    PT Time Calculation (min)  38 min    Activity Tolerance  Patient tolerated treatment well    Behavior During Therapy  [facility] for tasks assessed/performed       Past Medical History:  Diagnosis Date  . Asthma   . Cancer ([place]) 03/2019   left breast DCIS  . Hypertension   . Stroke [facility])    per pt no defecits    Past Surgical History:  Procedure Laterality Date  . BREAST LUMPECTOMY WITH AXILLARY LYMPH NODE BIOPSY Left 04/06/2019   Procedure: LEFT BREAST LUMPECTOMY WITH LEFT SENTINEL LYMPH NODE BIOPSY;  Surgeon: [name], MD;  Location: [facility];  Service: General;  Laterality: Left;  . BREAST SURGERY      There were no vitals filed for this visit.  Subjective Assessment - 05/02/19 1434    Subjective  Patient underwent a left lumpectomy and sentinel node biopsy (2 negative nodes) on 04/06/2019. She will have a re-excision on 05/04/2019. She is taking anti-estrogen therapy but reports that is is unknown if she will need radiation.    Pertinent History  Patient was diagnosed on 03/14/2019 with left invasive lobular carcinoma breast cancer. Patient underwent a left lumpectomy and sentinel node biopsy (2 negative nodes) on 04/06/2019. It is ER/PR positive and HER2 negative with a Ki67 of 10%.    Patient Stated Goals  See if my arm is doing ok    Currently in Pain?  Yes    Pain Score  4     Pain Location  Breast    Pain Orientation  Left;Lateral    Pain Descriptors / Indicators  Sore    Pain Type  Surgical  pain    Pain Onset  1 to 4 weeks ago    Pain Frequency  Intermittent    Aggravating Factors   Laying on my left side    Pain Relieving Factors  Sitting up    Multiple Pain Sites  No         [facility] PT Assessment - 05/02/19 0001      Assessment   Medical Diagnosis  s/p left lumpectomy and SLNB    Referring Provider (PT)  Dr. [name]    Onset Date/Surgical Date  04/06/19    Hand Dominance  Right    Prior Therapy  Baselines      Precautions   Precautions  Other (comment)    Precaution Comments  recent surgery and left arm lymphedema      Restrictions   Weight Bearing Restrictions  No      Balance Screen   Has the patient fallen in the past 6 months  No    Has the patient had a decrease in activity level because of a fear of falling?   No    Is the patient reluctant to leave their home because of a fear of falling?   No      Home Environment   Living Environment  Private residence    Living Arrangements  [place] lives close by   Available Help at [place]  Retired    Leisure  She walks around [place] and is very active; also lifts upper body weights      Cognition   Overall Cognitive Status  Within Functional Limits for tasks assessed      Observation/Other Assessments   Observations  Incision in left lateral superior breast still healing with steri-strips. Edema and palpable tenderness present.  (Pended)       Posture/Postural Control   Posture/Postural Control  Postural limitations    Postural Limitations  Rounded Shoulders;Forward head      ROM / Strength   AROM / PROM / Strength  AROM      AROM   AROM Assessment Site  Shoulder    Right/Left Shoulder  Left    Left Shoulder Extension  56 Degrees    Left Shoulder Flexion  134 Degrees    Left Shoulder ABduction  141 Degrees    Left Shoulder Internal Rotation  55 Degrees    Left Shoulder External Rotation  88 Degrees         LYMPHEDEMA/ONCOLOGY QUESTIONNAIRE - 05/02/19 1439      Type   Cancer Type  Left breast cancer      Surgeries   Lumpectomy Date  04/06/19    Sentinel Lymph Node Biopsy Date  04/06/19    Number Lymph Nodes Removed  2      Treatment   Active Chemotherapy Treatment  No    Past Chemotherapy Treatment  No    Active Radiation Treatment  No    Past Radiation Treatment  No    Current Hormone Treatment  No    Past Hormone Therapy  No      What other symptoms do you have   Are you Having Heaviness or Tightness  No    Are you having Pain  Yes    Are you having pitting edema  No    Is it Hard or Difficult finding clothes that fit  No    Do you have infections  No    Is there Decreased scar mobility  No    Stemmer Sign  No      Lymphedema Assessments   Lymphedema Assessments  Upper extremities      Right Upper Extremity Lymphedema   10 cm Proximal to Olecranon Process  23.8 cm    Olecranon Process  22 cm    10 cm Proximal to Ulnar Styloid Process  19.5 cm    Just Proximal to Ulnar Styloid Process  14.2 cm    Across Hand at [employer]  18.3 cm    At [place] of 2nd Digit  6.5 cm      Left Upper Extremity Lymphedema   10 cm Proximal to Olecranon Process  23.8 cm    Olecranon Process  21.8 cm    10 cm Proximal to Ulnar Styloid Process  19 cm    Just Proximal to Ulnar Styloid Process  14 cm    Across Hand at [employer]  17.8 cm    At [place] of 2nd Digit  5.8 cm        Quick Dash - 05/02/19 0001    Open a tight or new jar  No difficulty    Do  heavy household chores (wash walls, wash floors)  No difficulty    Carry a shopping bag or briefcase  No difficulty    Wash your back  No difficulty    Use a knife to cut food  No difficulty    Recreational activities in which you take some force or impact through your arm, shoulder, or hand (golf, hammering, tennis)  No difficulty    During the past week, to what extent has your arm, shoulder or hand problem interfered with  your normal social activities with family, friends, neighbors, or groups?  Not at all    During the past week, to what extent has your arm, shoulder or hand problem limited your work or other regular daily activities  Not at all    Arm, shoulder, or hand pain.  None    Tingling (pins and needles) in your arm, shoulder, or hand  None    Difficulty Sleeping  No difficulty    DASH Score  0 %                     PT Education - 05/02/19 1521    Education Details  Reviewed previously given post op HEP and issued new handout. Reviewed lymphedema precautions and spent time explaining why people need re-excisions sometimes.    Person(s) Educated  Patient    Methods  Explanation;Demonstration;Handout    Comprehension  Returned demonstration;Verbalized understanding          PT Long Term Goals - 05/02/19 1527      PT LONG TERM GOAL #1   Title  Patient will demonstrate she has regained full shoulder ROM and function post operatively compared to baseline assessment.    Time  8    Period  Weeks    Status  Achieved            Plan - 05/02/19 1524    Clinical Impression Statement  Patient is doing well s/p left lumpectomy and sentinel node biopsy. She is scheduled for a re-excision 05/04/2019. Her shoulder ROM is nearly back to baseline and when she begins her HEP, I anticipate she will quickly return to baseline as she has not started those yet. There is no sign of lymphedema and her incision appears to be healing well. She had many questions about the purpose of her re-excision so those were answered in general terms about why people need to have a second surgery at times but she was encouraged to contact her surgeon for further explanation. She has no other PT needs at this time.    PT Treatment/Interventions  ADLs/Self Care Home Management;Therapeutic exercise;Patient/family education    PT Next Visit Plan  D/C    PT Home Exercise Plan  Post op shoulder ROM HEP    Consulted  and Agree with Plan of Care  Patient;Family member/caregiver    Family Member Consulted  Granddaughter       Patient will benefit from skilled therapeutic intervention in order to improve the following deficits and impairments:  Postural dysfunction, Decreased range of motion, Decreased knowledge of precautions, Impaired UE functional use, Pain  Visit Diagnosis: Malignant neoplasm of upper-outer quadrant of left breast in female, estrogen receptor positive ([facility])  Abnormal posture  Aftercare following surgery for neoplasm     Problem List Patient Active Problem List   Diagnosis Date Noted  . Malignant neoplasm of upper-outer quadrant of left breast in female, estrogen receptor positive ([place]) 03/22/2019  . Chest pain 08/11/2012  .  GERD (gastroesophageal reflux disease) 08/11/2012  . Generalized anxiety disorder 08/11/2012   PHYSICAL THERAPY DISCHARGE SUMMARY  Visits from Start of Care: 2  Current functional level related to goals / functional outcomes: Goals met. See above for objective findings.   Remaining deficits: Mild shoulder ROM limitation   Education / Equipment: HEP and lymphedema risk reduction  Plan: Patient agrees to discharge.  Patient goals were met. Patient is being discharged due to meeting the stated rehab goals.  ?????         Annia Friendly, [place] 05/02/19 3:28 PM  [place], [place], [address] Phone: [phone]   Fax:  [phone]  Name: Jaselyn Nahm MRN: [id number] Date of Birth: 1940-08-06

## 2019-05-02 NOTE — Progress Notes (Signed)

## 2019-05-04 ENCOUNTER — Ambulatory Visit (HOSPITAL_BASED_OUTPATIENT_CLINIC_OR_DEPARTMENT_OTHER)
Admission: RE | Admit: 2019-05-04 | Discharge: 2019-05-04 | Disposition: A | Discharge status: Home / Self Care | Payer: Medicare HMO | Attending: General Surgery | Admitting: General Surgery

## 2019-05-04 ENCOUNTER — Encounter (HOSPITAL_BASED_OUTPATIENT_CLINIC_OR_DEPARTMENT_OTHER)
Admission: RE | Disposition: A | Discharge status: Home / Self Care | Payer: Self-pay | Source: Home / Self Care | Attending: General Surgery

## 2019-05-04 ENCOUNTER — Encounter (HOSPITAL_BASED_OUTPATIENT_CLINIC_OR_DEPARTMENT_OTHER): Payer: Self-pay | Admitting: General Surgery

## 2019-05-04 ENCOUNTER — Ambulatory Visit (HOSPITAL_BASED_OUTPATIENT_CLINIC_OR_DEPARTMENT_OTHER): Payer: Medicare HMO | Admitting: Certified Registered"

## 2019-05-04 ENCOUNTER — Other Ambulatory Visit: Payer: Self-pay

## 2019-05-04 DIAGNOSIS — Z8673 Personal history of transient ischemic attack (TIA), and cerebral infarction without residual deficits: Secondary | ICD-10-CM | POA: Diagnosis not present

## 2019-05-04 DIAGNOSIS — Z87891 Personal history of nicotine dependence: Secondary | ICD-10-CM | POA: Insufficient documentation

## 2019-05-04 DIAGNOSIS — K219 Gastro-esophageal reflux disease without esophagitis: Secondary | ICD-10-CM | POA: Insufficient documentation

## 2019-05-04 DIAGNOSIS — C50412 Malignant neoplasm of upper-outer quadrant of left female breast: Secondary | ICD-10-CM | POA: Insufficient documentation

## 2019-05-04 DIAGNOSIS — N6489 Other specified disorders of breast: Secondary | ICD-10-CM | POA: Insufficient documentation

## 2019-05-04 DIAGNOSIS — Z79899 Other long term (current) drug therapy: Secondary | ICD-10-CM | POA: Diagnosis not present

## 2019-05-04 DIAGNOSIS — I10 Essential (primary) hypertension: Secondary | ICD-10-CM | POA: Diagnosis not present

## 2019-05-04 HISTORY — PX: RE-EXCISION OF BREAST LUMPECTOMY: SHX6048

## 2019-05-04 SURGERY — EXCISION, LESION, BREAST
Anesthesia: General | Site: Breast | Laterality: Left

## 2019-05-04 MED ORDER — FENTANYL CITRATE (PF) 100 MCG/2ML IJ SOLN
INTRAMUSCULAR | Status: AC
  Filled 2019-05-04: qty 2

## 2019-05-04 MED ORDER — BUPIVACAINE HCL (PF) 0.25 % IJ SOLN
INTRAMUSCULAR | Status: DC | PRN
  Administered 2019-05-04: 9 mL

## 2019-05-04 MED ORDER — OXYCODONE HCL 5 MG PO TABS: 2.5000 mg | ORAL_TABLET | Freq: Four times a day (QID) | ORAL | 0 refills | Status: DC | PRN

## 2019-05-04 MED ORDER — CHLORHEXIDINE GLUCONATE CLOTH 2 % EX PADS: 6.0000 | MEDICATED_PAD | Freq: Once | CUTANEOUS | Status: DC

## 2019-05-04 MED ORDER — LACTATED RINGERS IV SOLN: INTRAVENOUS | Status: DC

## 2019-05-04 MED ORDER — OXYCODONE HCL 5 MG/5ML PO SOLN: 5.0000 mg | Freq: Once | ORAL | Status: DC | PRN

## 2019-05-04 MED ORDER — FENTANYL CITRATE (PF) 100 MCG/2ML IJ SOLN
25.0000 ug | INTRAMUSCULAR | Status: DC | PRN
  Administered 2019-05-04: 25 ug via INTRAVENOUS

## 2019-05-04 MED ORDER — PROPOFOL 10 MG/ML IV BOLUS
INTRAVENOUS | Status: DC | PRN
  Administered 2019-05-04: 100 mg via INTRAVENOUS

## 2019-05-04 MED ORDER — LIDOCAINE-EPINEPHRINE 0.5 %-1:200000 IJ SOLN
INTRAMUSCULAR | Status: DC | PRN
  Administered 2019-05-04: 9 mL

## 2019-05-04 MED ORDER — ACETAMINOPHEN 500 MG PO TABS
1000.0000 mg | ORAL_TABLET | ORAL | Status: AC
  Administered 2019-05-04: 12:00:00 1000 mg via ORAL

## 2019-05-04 MED ORDER — FENTANYL CITRATE (PF) 100 MCG/2ML IJ SOLN
50.0000 ug | INTRAMUSCULAR | Status: AC | PRN
  Administered 2019-05-04 (×6): 25 ug via INTRAVENOUS

## 2019-05-04 MED ORDER — CEFAZOLIN SODIUM-DEXTROSE 2-4 GM/100ML-% IV SOLN
2.0000 g | INTRAVENOUS | Status: AC
  Administered 2019-05-04: 2 g via INTRAVENOUS

## 2019-05-04 MED ORDER — DEXAMETHASONE SODIUM PHOSPHATE 10 MG/ML IJ SOLN
INTRAMUSCULAR | Status: DC | PRN
  Administered 2019-05-04: 5 mg via INTRAVENOUS

## 2019-05-04 MED ORDER — ONDANSETRON HCL 4 MG/2ML IJ SOLN
INTRAMUSCULAR | Status: DC | PRN
  Administered 2019-05-04: 4 mg via INTRAVENOUS

## 2019-05-04 MED ORDER — CEFAZOLIN SODIUM-DEXTROSE 2-4 GM/100ML-% IV SOLN
INTRAVENOUS | Status: AC
  Filled 2019-05-04: qty 100

## 2019-05-04 MED ORDER — LIDOCAINE 2% (20 MG/ML) 5 ML SYRINGE
INTRAMUSCULAR | Status: DC | PRN
  Administered 2019-05-04: 40 mg via INTRAVENOUS

## 2019-05-04 MED ORDER — LIDOCAINE-EPINEPHRINE (PF) 1 %-1:200000 IJ SOLN: INTRAMUSCULAR | Status: DC | PRN

## 2019-05-04 MED ORDER — PROPOFOL 10 MG/ML IV BOLUS
INTRAVENOUS | Status: AC
  Filled 2019-05-04: qty 20

## 2019-05-04 MED ORDER — ONDANSETRON HCL 4 MG/2ML IJ SOLN: 4.0000 mg | Freq: Once | INTRAMUSCULAR | Status: DC | PRN

## 2019-05-04 MED ORDER — OXYCODONE HCL 5 MG PO TABS: 5.0000 mg | ORAL_TABLET | Freq: Once | ORAL | Status: DC | PRN

## 2019-05-04 MED ORDER — ACETAMINOPHEN 500 MG PO TABS
ORAL_TABLET | ORAL | Status: AC
  Filled 2019-05-04: qty 2

## 2019-05-04 MED ORDER — MIDAZOLAM HCL 2 MG/2ML IJ SOLN: 1.0000 mg | INTRAMUSCULAR | Status: DC | PRN

## 2019-05-04 SURGICAL SUPPLY — 59 items
ADH SKN CLS APL DERMABOND .7 (GAUZE/BANDAGES/DRESSINGS) ×1
APL PRP STRL LF DISP 70% ISPRP (MISCELLANEOUS) ×1
BINDER BREAST 3XL (GAUZE/BANDAGES/DRESSINGS) IMPLANT
BINDER BREAST LRG (GAUZE/BANDAGES/DRESSINGS) IMPLANT
BINDER BREAST MEDIUM (GAUZE/BANDAGES/DRESSINGS) ×2 IMPLANT
BINDER BREAST XLRG (GAUZE/BANDAGES/DRESSINGS) IMPLANT
BINDER BREAST XXLRG (GAUZE/BANDAGES/DRESSINGS) IMPLANT
BLADE SURG 10 STRL SS (BLADE) ×3 IMPLANT
BLADE SURG 15 STRL LF DISP TIS (BLADE) IMPLANT
BLADE SURG 15 STRL SS (BLADE)
CANISTER SUCT 1200ML W/VALVE (MISCELLANEOUS) ×3 IMPLANT
CHLORAPREP W/TINT 26 (MISCELLANEOUS) ×3 IMPLANT
CLIP VESOCCLUDE LG 6/CT (CLIP) ×3 IMPLANT
CLOSURE WOUND 1/2 X4 (GAUZE/BANDAGES/DRESSINGS) ×1
COVER BACK TABLE 60X90IN (DRAPES) ×3 IMPLANT
COVER MAYO STAND STRL (DRAPES) ×3 IMPLANT
COVER WAND RF STERILE (DRAPES) IMPLANT
DECANTER SPIKE VIAL GLASS SM (MISCELLANEOUS) IMPLANT
DERMABOND ADVANCED (GAUZE/BANDAGES/DRESSINGS) ×2
DERMABOND ADVANCED .7 DNX12 (GAUZE/BANDAGES/DRESSINGS) ×1 IMPLANT
DRAPE LAPAROSCOPIC ABDOMINAL (DRAPES) ×3 IMPLANT
DRAPE UTILITY XL STRL (DRAPES) ×3 IMPLANT
ELECT COATED BLADE 2.86 ST (ELECTRODE) ×3 IMPLANT
ELECT REM PT RETURN 9FT ADLT (ELECTROSURGICAL) ×3
ELECTRODE REM PT RTRN 9FT ADLT (ELECTROSURGICAL) ×1 IMPLANT
GAUZE SPONGE 4X4 12PLY STRL (GAUZE/BANDAGES/DRESSINGS) IMPLANT
GAUZE SPONGE 4X4 12PLY STRL LF (GAUZE/BANDAGES/DRESSINGS) ×3 IMPLANT
GLOVE BIO SURGEON STRL SZ 6 (GLOVE) ×3 IMPLANT
GLOVE BIO SURGEON STRL SZ 6.5 (GLOVE) ×1 IMPLANT
GLOVE BIO SURGEONS STRL SZ 6.5 (GLOVE) ×1
GLOVE BIOGEL PI IND STRL 6.5 (GLOVE) ×1 IMPLANT
GLOVE BIOGEL PI INDICATOR 6.5 (GLOVE) ×2
GLOVE EXAM NITRILE MD LF STRL (GLOVE) ×2 IMPLANT
GOWN STRL REUS W/ TWL LRG LVL3 (GOWN DISPOSABLE) ×1 IMPLANT
GOWN STRL REUS W/TWL 2XL LVL3 (GOWN DISPOSABLE) ×3 IMPLANT
GOWN STRL REUS W/TWL LRG LVL3 (GOWN DISPOSABLE) ×6
KIT MARKER MARGIN INK (KITS) ×3 IMPLANT
NDL HYPO 25X1 1.5 SAFETY (NEEDLE) ×1 IMPLANT
NEEDLE HYPO 25X1 1.5 SAFETY (NEEDLE) ×3 IMPLANT
NS IRRIG 1000ML POUR BTL (IV SOLUTION) ×3 IMPLANT
PACK BASIN DAY SURGERY FS (CUSTOM PROCEDURE TRAY) ×3 IMPLANT
PENCIL SMOKE EVACUATOR (MISCELLANEOUS) ×3 IMPLANT
RETRACTOR ONETRAX LX 90X20 (MISCELLANEOUS) ×2 IMPLANT
SLEEVE SCD COMPRESS KNEE MED (MISCELLANEOUS) ×3 IMPLANT
SPONGE LAP 18X18 RF (DISPOSABLE) ×3 IMPLANT
STAPLER VISISTAT 35W (STAPLE) IMPLANT
STRIP CLOSURE SKIN 1/2X4 (GAUZE/BANDAGES/DRESSINGS) ×2 IMPLANT
SUT MON AB 4-0 PC3 18 (SUTURE) ×3 IMPLANT
SUT SILK 2 0 SH (SUTURE) IMPLANT
SUT VIC AB 3-0 54X BRD REEL (SUTURE) IMPLANT
SUT VIC AB 3-0 BRD 54 (SUTURE)
SUT VIC AB 3-0 SH 27 (SUTURE) ×3
SUT VIC AB 3-0 SH 27X BRD (SUTURE) ×1 IMPLANT
SYR BULB 3OZ (MISCELLANEOUS) ×3 IMPLANT
SYR CONTROL 10ML LL (SYRINGE) ×3 IMPLANT
TOWEL GREEN STERILE FF (TOWEL DISPOSABLE) ×3 IMPLANT
TUBE CONNECTING 20'X1/4 (TUBING) ×1
TUBE CONNECTING 20X1/4 (TUBING) ×2 IMPLANT
YANKAUER SUCT BULB TIP NO VENT (SUCTIONS) ×3 IMPLANT

## 2019-05-04 NOTE — Discharge Instructions (Addendum)
[facility] °Office Phone Number [phone] ° °BREAST BIOPSY/ PARTIAL MASTECTOMY: POST OP INSTRUCTIONS ° °Always review your discharge instruction sheet given to you by the facility where your surgery was performed. ° °IF YOU HAVE DISABILITY OR FAMILY LEAVE FORMS, YOU MUST BRING THEM TO THE OFFICE FOR PROCESSING.  DO NOT GIVE THEM TO YOUR DOCTOR. ° °1. A prescription for pain medication may be given to you upon discharge.  Take your pain medication as prescribed, if needed.  If narcotic pain medicine is not needed, then you may take acetaminophen (Tylenol) or ibuprofen (Advil) as needed. °2. Take your usually prescribed medications unless otherwise directed °3. If you need a refill on your pain medication, please contact your pharmacy.  They will contact our office to request authorization.  Prescriptions will not be filled after 5pm or on week-ends. °4. You should eat very light the first 24 hours after surgery, such as soup, crackers, pudding, etc.  Resume your normal diet the day after surgery. °5. Most patients will experience some swelling and bruising in the breast.  Ice packs and a good support bra will help.  Swelling and bruising can take several days to resolve.  °6. It is common to experience some constipation if taking pain medication after surgery.  Increasing fluid intake and taking a stool softener will usually help or prevent this problem from occurring.  A mild laxative (Milk of Magnesia or Miralax) should be taken according to package directions if there are no bowel movements after 48 hours. °7. Unless discharge instructions indicate otherwise, you may remove your bandages 48 hours after surgery, and you may shower at that time.  You may have steri-strips (small skin tapes) in place directly over the incision.  These strips should be left on the skin for 7-10 days.   Any sutures or staples will be removed at the office during your follow-up visit. °8. ACTIVITIES:  You may resume  regular daily activities (gradually increasing) beginning the next day.  Wearing a good support bra or sports bra (or the breast binder) minimizes pain and swelling.  You may have sexual intercourse when it is comfortable. °a. You may drive when you no longer are taking prescription pain medication, you can comfortably wear a seatbelt, and you can safely maneuver your car and apply brakes. °b. RETURN TO WORK:  __________1 week_______________ °9. You should see your doctor in the office for a follow-up appointment approximately two weeks after your surgery.  Your doctor’s nurse will typically make your follow-up appointment when she calls you with your pathology report.  Expect your pathology report 2-3 business days after your surgery.  You may call to check if you do not hear from us after three days. ° ° °WHEN TO CALL YOUR DOCTOR: °1. Fever over 101.0 °2. Nausea and/or vomiting. °3. Extreme swelling or bruising. °4. Continued bleeding from incision. °5. Increased pain, redness, or drainage from the incision. ° °The clinic staff is available to answer your questions during regular business hours.  Please don’t hesitate to call and ask to speak to one of the nurses for clinical concerns.  If you have a medical emergency, go to the nearest emergency room or call 911.  A surgeon from [facility] is always on call at the hospital. ° °For further questions, please visit [email or web address]  ° ° °Post Anesthesia Home Care Instructions ° °Activity: °Get plenty of rest for the remainder of the day. A responsible individual must stay with you for 24   hours following the procedure.  °For the next 24 hours, DO NOT: °-Drive a car °-Operate machinery °-Drink alcoholic beverages °-Take any medication unless instructed by your physician °-Make any legal decisions or sign important papers. ° °Meals: °Start with liquid foods such as gelatin or soup. Progress to regular foods as tolerated. Avoid greasy, spicy,  heavy foods. If nausea and/or vomiting occur, drink only clear liquids until the nausea and/or vomiting subsides. Call your physician if vomiting continues. ° °Special Instructions/Symptoms: °Your throat may feel dry or sore from the anesthesia or the breathing tube placed in your throat during surgery. If this causes discomfort, gargle with warm salt water. The discomfort should disappear within 24 hours. ° °If you had a scopolamine patch placed behind your ear for the management of post- operative nausea and/or vomiting: ° °1. The medication in the patch is effective for 72 hours, after which it should be removed.  Wrap patch in a tissue and discard in the trash. Wash hands thoroughly with soap and water. °2. You may remove the patch earlier than 72 hours if you experience unpleasant side effects which may include dry mouth, dizziness or visual disturbances. °3. Avoid touching the patch. Wash your hands with soap and water after contact with the patch. °  ° °

## 2019-05-04 NOTE — Anesthesia Postprocedure Evaluation (Signed)
Anesthesia Post Note  Patient: Nicole Rush  Procedure(s) Performed: RE-EXCISION OF LEFT BREAST LUMPECTOMY (Left Breast)     Patient location during evaluation: PACU Anesthesia Type: General Level of consciousness: awake and alert and oriented Pain management: pain level controlled Vital Signs Assessment: post-procedure vital signs reviewed and stable Respiratory status: spontaneous breathing, nonlabored ventilation and respiratory function stable Cardiovascular status: blood pressure returned to baseline and stable Postop Assessment: no apparent nausea or vomiting Anesthetic complications: no    Last Vitals:  Vitals:   05/04/19 1556 05/04/19 1615  BP:    Pulse: 89   Resp: (!) 31   Temp:    SpO2: 100% 99%    Last Pain:  Vitals:   05/04/19 1615  TempSrc:   PainSc: 3                  Renesmae Donahey A.

## 2019-05-04 NOTE — H&P (Signed)
Nicole Rush is an 79 y.o. female.   Chief Complaint: left breast cancer HPI:  Pt is an 79 yo F with a positive margin following surgery for left breast cancer.  She has been doing well.    Past Medical History:  Diagnosis Date  . Asthma   . 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 Hypertension   . Stroke [facility])    per pt no defecits    Past Surgical History:  Procedure Laterality Date  . BREAST LUMPECTOMY WITH AXILLARY LYMPH NODE BIOPSY Left 04/06/2019   Procedure: LEFT BREAST LUMPECTOMY WITH LEFT SENTINEL LYMPH NODE BIOPSY;  Surgeon: [name], MD;  Location: [facility];  Service: General;  Laterality: Left;  . BREAST SURGERY      Family History  Problem Relation Age of Onset  . Breast cancer Mother   . Lung cancer Father    Social History:  reports that she has quit smoking. She has never used smokeless tobacco. She reports that she does not drink alcohol or use drugs.  Allergies: No Known Allergies  Medications Prior to Admission  Medication Sig Dispense Refill  . anastrozole (ARIMIDEX) 1 MG tablet Take 1 tablet (1 mg total) by mouth daily. 90 tablet 3  . loratadine (CLARITIN) 10 MG tablet Take 10 mg by mouth daily.    Marland Kitchen losartan-hydrochlorothiazide (HYZAAR) 100-25 MG per tablet Take 1 tablet by mouth daily.    Marland Kitchen oxyCODONE (OXY IR/ROXICODONE) 5 MG immediate release tablet Take 0.5-1 tablets (2.5-5 mg total) by mouth every 6 (six) hours as needed for severe pain. 15 tablet 0    Results for orders placed or performed during the hospital encounter of 05/04/19 (from the past 48 hour(s))  Basic metabolic panel     Status: None   Collection Time: 05/02/19  3:24 PM  Result Value Ref Range   Sodium 139 135 - 145 mmol/L   Potassium 3.8 3.5 - 5.1 mmol/L   Chloride 106 98 - 111 mmol/L   CO2 25 22 - 32 mmol/L   Glucose, Bld 89 70 - 99 mg/dL   BUN 22 8 - 23 mg/dL   Creatinine, Ser 0.65 0.44 - 1.00 mg/dL   Calcium 9.5 8.9 - 10.3 mg/dL   GFR calc non  Af Amer >60 >60 mL/min   GFR calc Af Amer >60 >60 mL/min   Anion gap 8 5 - 15    Comment: Performed at [facility] [address] [place], [place] [address]   No results found.  Review of Systems  All other systems reviewed and are negative.   Blood pressure 133/66, pulse 72, temperature (!) 97.5 F (36.4 C), temperature source Temporal, resp. rate 18, height 5' (1.524 m), weight 47.2 kg, SpO2 100 %. Physical Exam  Constitutional: She is oriented to person, place, and time. She appears well-developed and well-nourished.  HENT:  Head: Normocephalic and atraumatic.  Eyes: Pupils are equal, round, and reactive to light. Conjunctivae are normal. No scleral icterus.  Neck: No tracheal deviation present. No thyromegaly present.  Cardiovascular: Normal rate and regular rhythm.  Respiratory: Effort normal. No respiratory distress.  No evidence of infection on breast   GI: Soft. She exhibits no distension.  Musculoskeletal:        General: Normal range of motion.     Cervical back: Normal range of motion and neck supple.  Neurological: She is alert and oriented to person, place, and time. Coordination normal.  Skin:  Skin is warm and dry. No rash noted. No erythema. No pallor.  Psychiatric: She has a normal mood and affect. Her behavior is normal. Judgment and thought content normal.     Assessment/Plan Left breast cancer with positive margin.  Plan reexcision of left breast cancer.    [name], MD 05/04/2019, 12:50 PM

## 2019-05-04 NOTE — Anesthesia Procedure Notes (Signed)
Procedure Name: LMA Insertion Date/Time: 05/04/2019 2:55 PM Performed by: Gwyndolyn Saxon, CRNA Pre-anesthesia Checklist: Patient identified, Emergency Drugs available, Suction available and Patient being monitored Patient Re-evaluated:Patient Re-evaluated prior to induction Oxygen Delivery Method: Circle system utilized Preoxygenation: Pre-oxygenation with 100% oxygen Induction Type: IV induction Ventilation: Mask ventilation without difficulty LMA: LMA with gastric port inserted LMA Size: 4.0 Number of attempts: 3 (first attempt with #3 LMA and large leak; second attempt with #2 LMA and large leak) Placement Confirmation: positive ETCO2 and breath sounds checked- equal and bilateral Tube secured with: Tape Dental Injury: Teeth and Oropharynx as per pre-operative assessment

## 2019-05-04 NOTE — Anesthesia Preprocedure Evaluation (Signed)
Anesthesia Evaluation  Patient identified by MRN, date of birth, ID band Patient awake    Reviewed: Allergy & Precautions, NPO status , Patient's Chart, lab work & pertinent test results  History of Anesthesia Complications Negative for: history of anesthetic complications  Airway Mallampati: I  TM Distance: >3 FB Neck ROM: Full    Dental  (+) Edentulous Upper, Edentulous Lower   Pulmonary former smoker,  04/02/2019 SARS CoV2 NEG   breath sounds clear to auscultation       Cardiovascular hypertension, Pt. on medications (-) angina Rhythm:Regular Rate:Normal     Neuro/Psych Anxiety CVA (R arm weakness), Residual Symptoms    GI/Hepatic Neg liver ROS, GERD  Controlled and Medicated,  Endo/Other  negative endocrine ROSLeft breast Ca S/P lumpectomy with inadequate margins  Renal/GU negative Renal ROS     Musculoskeletal   Abdominal   Peds  Hematology negative hematology ROS (+)   Anesthesia Other Findings   Reproductive/Obstetrics                             Anesthesia Physical  Anesthesia Plan  ASA: III  Anesthesia Plan: General   Post-op Pain Management: GA combined w/ Regional for post-op pain   Induction: Intravenous  PONV Risk Score and Plan: 3 and Ondansetron, Dexamethasone and Treatment may vary due to age or medical condition  Airway Management Planned: LMA  Additional Equipment:   Intra-op Plan:   Post-operative Plan:   Informed Consent: I have reviewed the patients History and Physical, chart, labs and discussed the procedure including the risks, benefits and alternatives for the proposed anesthesia with the patient or authorized representative who has indicated his/her understanding and acceptance.     Dental advisory given  Plan Discussed with: CRNA and Surgeon  Anesthesia Plan Comments:         Anesthesia Quick Evaluation

## 2019-05-04 NOTE — Transfer of Care (Signed)
Immediate Anesthesia Transfer of Care Note  Patient: Nicole Rush  Procedure(s) Performed: RE-EXCISION OF LEFT BREAST LUMPECTOMY (Left Breast)  Patient Location: PACU  Anesthesia Type:General  Level of Consciousness: awake, alert  and oriented  Airway & Oxygen Therapy: Patient Spontanous Breathing and Patient connected to nasal cannula oxygen  Post-op Assessment: Report given to RN and Post -op Vital signs reviewed and stable  Post vital signs: Reviewed and stable  Last Vitals:  Vitals Value Taken Time  BP 167/93 05/04/19 1552  Temp    Pulse 87 05/04/19 1559  Resp 21 05/04/19 1559  SpO2 97 % 05/04/19 1559  Vitals shown include unvalidated device data.  Last Pain:  Vitals:   05/04/19 1212  TempSrc: Temporal  PainSc: 0-No pain      Patients Stated Pain Goal: 1 ([id number])  Complications: No apparent anesthesia complications

## 2019-05-04 NOTE — Op Note (Signed)
Re-excisional Left Breast Lumpectomy   Indications: This patient presents with history of positive margin after partial mastectomy for left breast cancer   Pre-operative Diagnosis: left breast cancer   Post-operative Diagnosis: left breast cancer   Surgeon: [name]   Assistants: n/a   Anesthesia: General anesthesia and Local anesthesia   ASA Class: 3   Procedure Details  The patient was seen in the Holding Room. The risks, benefits, complications, treatment options, and expected outcomes were discussed with the patient. The possibilities of reaction to medication, pulmonary aspiration, bleeding, infection, the need for additional procedures, failure to diagnose a condition, and creating a complication requiring transfusion or operation were discussed with the patient. The patient concurred with the proposed plan, giving informed consent. The site of surgery properly noted/marked. The patient was taken to Operating Room # 1, identified, and the procedure verified as re-excision of left breast cancer.  After induction of anesthesia, the left breast and chest were prepped and draped in standard fashion.  The lumpectomy was performed by reopening the prior axillary incision. Seroma was aspirated.  Additional margin were taken at the posteromedial borders of the partial mastectomy cavity. Dissection was carried down to the pectoral fascia. Orientation sutures were placed in the specimens. Hemostasis was achieved with cautery. The wound was irrigated and closed with a 3-0 Vicryl deep dermal interrupted and a 4-0 Monocryl subcuticular closure in layers.  Sterile dressings were applied. At the end of the operation, all sponge, instrument, and needle counts were correct.   Findings:  grossly clear surgical margins, posterior margin is pectoralis, anterior margin is skin, superolateral is axillary fat pad.  Estimated Blood Loss: Minimal   Drains: none   Specimens: additional posteromedial  margin  Complications: None; patient tolerated the procedure well.   Disposition: PACU - hemodynamically stable.   Condition: stable

## 2019-05-05 ENCOUNTER — Encounter: Payer: Self-pay | Admitting: *Deleted

## 2019-05-06 ENCOUNTER — Other Ambulatory Visit: Payer: Self-pay

## 2019-05-09 LAB — SURGICAL PATHOLOGY

## 2019-05-17 ENCOUNTER — Ambulatory Visit: Payer: Medicare HMO | Admitting: Radiation Oncology

## 2019-05-17 ENCOUNTER — Other Ambulatory Visit: Payer: Self-pay

## 2019-05-17 ENCOUNTER — Encounter: Payer: Self-pay | Admitting: *Deleted

## 2019-05-17 ENCOUNTER — Ambulatory Visit
Admission: RE | Admit: 2019-05-17 | Discharge: 2019-05-17 | Disposition: A | Discharge status: Home / Self Care | Payer: Medicare HMO | Source: Ambulatory Visit | Attending: Radiation Oncology | Admitting: Radiation Oncology

## 2019-05-17 DIAGNOSIS — C50412 Malignant neoplasm of upper-outer quadrant of left female breast: Secondary | ICD-10-CM

## 2019-05-17 DIAGNOSIS — Z170 Estrogen receptor positive status [ER+]: Secondary | ICD-10-CM

## 2019-05-17 MED ORDER — ANASTROZOLE 1 MG PO TABS: 1.0000 mg | ORAL_TABLET | Freq: Every day | ORAL | 0 refills | Status: DC

## 2019-05-17 NOTE — Progress Notes (Signed)
Radiation Oncology         [phone] ________________________________  Follow Up New Visit - Conducted via telephone due to current COVID-19 concerns for limiting patient exposure  I spoke with the patient to conduct this consult visit via telephone to spare the patient unnecessary potential exposure in the healthcare setting during the current COVID-19 pandemic. The patient was notified in advance and was offered a [place] meeting to allow for face to face communication but unfortunately reported that they did not have the appropriate resources/technology to support such a visit and instead preferred to proceed with a telephone visit  ________________________________  Name: Nicole Rush        MRN: [id number]  Date of Service: 05/17/2019 DOB: [birth year]  [id number], Nicole Cleverly, MD  Lin Landsman, MD     REFERRING PHYSICIAN: Lin Landsman, MD   DIAGNOSIS: The encounter diagnosis was Malignant neoplasm of upper-outer quadrant of left breast in female, estrogen receptor positive ([place]).   HISTORY OF PRESENT ILLNESS: Nicole Rush is a 79 y.o. female originally seen in the multidisciplinary breast clinic for a new diagnosis of left breast cancer. The patient was noted to have a palpable mass in the left breast for which she sought further evaluation.  Diagnostic imaging revealed a mass in the upper outer quadrant in the 2 o'clock position measuring 1.3 x 1.1 x 1.1 cm.  Her axilla was negative for adenopathy.  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  Her tumor was ER/PR positive, HER-2 negative, and her Ki-67 was 10%.  She proceeded with lumpectomy on 04/06/2019 revealing a grade 2 invasive ductal carcinoma measuring 1.4 cm and associated DCIS. No lobular phenotype was noted. Her DCIS involved the medial margin, and her invasive carcinoma was <1 mm to the inferior margin. She had 2 sampled nodes that were negative. She went for re-excision on 05/04/19 of the  medial and posterior margin and this revealed a microscopic focus of residual DCIS that was low grade, and this was less than 1 mm from the inked resection margin. She has started an aromatase inhibitor already. She is contacted by phone to discuss options of adjuvant radiotherapy.   PREVIOUS RADIATION THERAPY: No   PAST MEDICAL HISTORY:  Past Medical History:  Diagnosis Date  . Asthma   . 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 Hypertension   . Stroke ([place])    per pt no defecits       PAST SURGICAL HISTORY: Past Surgical History:  Procedure Laterality Date  . BREAST LUMPECTOMY WITH AXILLARY LYMPH NODE BIOPSY Left 04/06/2019   Procedure: LEFT BREAST LUMPECTOMY WITH LEFT SENTINEL LYMPH NODE BIOPSY;  Surgeon: [name], MD;  Location: [facility];  Service: General;  Laterality: Left;  . BREAST SURGERY    . RE-EXCISION OF BREAST LUMPECTOMY Left 05/04/2019   Procedure: RE-EXCISION OF LEFT BREAST LUMPECTOMY;  Surgeon: [name], MD;  Location: [facility];  Service: General;  Laterality: Left;     FAMILY HISTORY:  Family History  Problem Relation Age of Onset  . Breast cancer Mother   . Lung cancer Father      SOCIAL HISTORY:  reports that she has quit smoking. She has never used smokeless tobacco. She reports that she does not drink alcohol or use drugs.  The patient is widowed and resides in [place]. She reports she has 7 children and many grand and great children.    ALLERGIES: Patient  has no known allergies.   MEDICATIONS:  Current Outpatient Medications  Medication Sig Dispense Refill  . anastrozole (ARIMIDEX) 1 MG tablet Take 1 tablet (1 mg total) by mouth daily. 90 tablet 3  . loratadine (CLARITIN) 10 MG tablet Take 10 mg by mouth daily.    Marland Kitchen losartan-hydrochlorothiazide (HYZAAR) 100-25 MG per tablet Take 1 tablet by mouth daily.    Marland Kitchen oxyCODONE (OXY IR/ROXICODONE) 5 MG immediate release tablet Take 0.5-1 tablets (2.5-5 mg  total) by mouth every 6 (six) hours as needed for severe pain. 15 tablet 0  . rosuvastatin (CRESTOR) 20 MG tablet Take 20 mg by mouth daily.     No current facility-administered medications for this encounter.     REVIEW OF SYSTEMS: On review of systems, the patient reports that she is doing well overall. She denies any chest pain, shortness of breath, cough, fevers, chills, night sweats, unintended weight changes. She denies any bowel or bladder disturbances, and denies abdominal pain, nausea or vomiting. She denies any new musculoskeletal or joint aches or pains. A complete review of systems is obtained and is otherwise negative.     PHYSICAL EXAM:  Unable to assess to due encounter type.   ECOG = 0  0 - Asymptomatic (Fully active, able to carry on all predisease activities without restriction)  1 - Symptomatic but completely ambulatory (Restricted in physically strenuous activity but ambulatory and able to carry out work of a light or sedentary nature. For example, light housework, office work)  2 - Symptomatic, <50% in bed during the day (Ambulatory and capable of all self care but unable to carry out any work activities. Up and about more than 50% of waking hours)  3 - Symptomatic, >50% in bed, but not bedbound (Capable of only limited self-care, confined to bed or chair 50% or more of waking hours)  4 - Bedbound (Completely disabled. Cannot carry on any self-care. Totally confined to bed or chair)  5 - Death   Eustace Pen MM, Creech RH, Tormey DC, et al. [phone]). "Toxicity and response criteria of the [facility]". [place] Oncol. 5 (6): 649-55    LABORATORY DATA:  Lab Results  Component Value Date   WBC 5.9 03/23/2019   HGB 11.2 (L) 03/23/2019   HCT 33.8 (L) 03/23/2019   MCV 80.3 03/23/2019   PLT 312 03/23/2019   Lab Results  Component Value Date   NA 139 05/02/2019   K 3.8 05/02/2019   CL 106 05/02/2019   CO2 25 05/02/2019   Lab Results   Component Value Date   ALT 21 03/23/2019   AST 21 03/23/2019   ALKPHOS 60 03/23/2019   BILITOT 0.4 03/23/2019      RADIOGRAPHY: No results found.     IMPRESSION/PLAN: 1. 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 Dr. Lisbeth Renshaw discusses the final pathology results and reviews the nature of left breast disease. We dicussed that she is completed with surgery at this time. The margin status was close, both for invasive disease inferiorly in the first surgery, and DCIS was less that 1 mm from the resected medial margin. While the margins are technically negative, we reviewed that because of the close proximity of the margin, she would still be a candidate for radiotherapy as well as adjuvant antiestrogen therapy as well to reduce the risks of recurrence.  We did discuss the risks, benefits, short, and long term effects of radiotherapy. Dr. Lisbeth Renshaw discusses  the delivery and logistics of radiotherapy and in this situation offers a course of 4  weeks of radiotherapy. She has decided she would like to forgo radiotherapy and focus on taking antiestrogen therapy. We would be happy to see her at a later time if she changes her mind.  Given current concerns for patient exposure during the COVID-19 pandemic, this encounter was conducted via telephone.  The patient has given verbal consent for this type of encounter. The time spent during this encounter was 35 minutes and 50% of that time was spent in the coordination of her care. The attendants for this meeting include Dr. Lisbeth Renshaw, Shona Simpson, [facility] and Joycelyn Schmid Fricke  During the encounter, Dr. Lisbeth Renshaw and Shona Simpson [facility] were located at [facility] Radiation Oncology Department.  Joycelyn Schmid Cansler  was located at home.  The above documentation reflects my direct findings during this shared patient visit. Please see the separate note by Dr. Lisbeth Renshaw on this date for the remainder of the patient's  plan of care.    Carola Rhine, PAC

## 2019-06-15 ENCOUNTER — Other Ambulatory Visit: Payer: Self-pay | Admitting: *Deleted

## 2019-06-15 MED ORDER — ANASTROZOLE 1 MG PO TABS: 1.0000 mg | ORAL_TABLET | Freq: Every day | ORAL | 1 refills | Status: DC

## 2019-06-26 ENCOUNTER — Ambulatory Visit: Payer: Medicare HMO | Attending: Internal Medicine

## 2019-06-26 DIAGNOSIS — Z23 Encounter for immunization: Secondary | ICD-10-CM

## 2019-06-26 NOTE — Progress Notes (Signed)
   Covid-19 Vaccination Clinic  Name:  Nicole Rush    MRN: [id number] DOB: [birth year]  06/26/2019  [name] was observed post Covid-19 immunization for 15 minutes without incident. She was provided with Vaccine Information Sheet and instruction to access the V-Safe system.   [name] was instructed to call 911 with any severe reactions post vaccine: Marland Kitchen Difficulty breathing  . Swelling of face and throat  . A fast heartbeat  . A bad rash all over body  . Dizziness and weakness   Immunizations Administered    Name Date Dose VIS Date Route   Pfizer COVID-19 Vaccine 06/26/2019 10:39 AM 0.3 mL 04/01/2019 Intramuscular   Manufacturer: [place]   Lot: [id number]   [place]: [id number]

## 2019-07-18 NOTE — Progress Notes (Deleted)
SURVIVORSHIP VISIT:    BRIEF ONCOLOGIC HISTORY:  Oncology History  Malignant neoplasm of upper-outer quadrant of left breast in female, estrogen receptor positive ([place])  03/22/2019 Initial Diagnosis   Patient palpated a left breast mass. Mammogram showed two cysts in the right breast. In the left breast, a 1.3cm mass at the 2 o'clock position with associated calcifications spanning in total 2.0cm, no abnormal lymph nodes. 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    04/13/2019 Cancer Staging   Staging form: Breast, AJCC 8th Edition - Pathologic stage from 04/13/2019: Stage IA (pT1c, pN0, cM0, G2, ER+, PR+, HER2-)    05/2019 - 05/2024 Anti-estrogen oral therapy   Anastrozole     INTERVAL HISTORY:  Ms. Verne to review her survivorship care plan detailing her treatment course for breast cancer, as well as monitoring long-term side effects of that treatment, education regarding health maintenance, screening, and overall wellness and health promotion.     Overall, Ms. Berke reports feeling quite well   REVIEW OF SYSTEMS:  Review of Systems  Constitutional: Negative for appetite change, chills, fatigue, fever and unexpected weight change.  HENT:   Negative for hearing loss, lump/mass, sore throat and trouble swallowing.   Eyes: Negative for eye problems and icterus.  Respiratory: Negative for chest tightness, cough and shortness of breath.   Cardiovascular: Negative for chest pain, leg swelling and palpitations.  Gastrointestinal: Negative for abdominal distention, abdominal pain, constipation, diarrhea, nausea and vomiting.  Endocrine: Negative for hot  flashes.  Genitourinary: Negative for difficulty urinating.   Musculoskeletal: Negative for arthralgias.  Skin: Negative for itching and rash.  Neurological: Negative for dizziness, extremity weakness, headaches and numbness.  Hematological: Negative for adenopathy. Does not bruise/bleed easily.  Psychiatric/Behavioral: Negative for depression. The patient is not nervous/anxious.    Breast: Denies any new nodularity, masses, tenderness, nipple changes, or nipple discharge.      ONCOLOGY TREATMENT TEAM:  1. Surgeon:  Dr. Barry Dienes at [facility] 2. Medical Oncologist: Dr. Lindi Adie  3. Radiation Oncologist: Dr. Lisbeth Renshaw    PAST MEDICAL/SURGICAL HISTORY:  Past Medical History:  Diagnosis Date  . Asthma   . Cancer ([place]) 03/2019   left breast DCIS  . Hypertension   . Stroke [facility])    per pt no defecits   Past Surgical History:  Procedure Laterality Date  . BREAST LUMPECTOMY WITH AXILLARY LYMPH NODE BIOPSY Left 04/06/2019   Procedure: LEFT BREAST LUMPECTOMY WITH LEFT SENTINEL LYMPH NODE BIOPSY;  Surgeon: [name], MD;  Location: [facility];  Service: General;  Laterality: Left;  . BREAST SURGERY    . RE-EXCISION OF BREAST LUMPECTOMY Left 05/04/2019   Procedure: RE-EXCISION OF LEFT BREAST LUMPECTOMY;  Surgeon: [name], MD;  Location: [facility];  Service: General;  Laterality: Left;     ALLERGIES:  No Known Allergies   CURRENT MEDICATIONS:  Outpatient Encounter Medications as of 07/19/2019  Medication Sig  . anastrozole (ARIMIDEX) 1 MG tablet Take 1 tablet (1 mg total) by mouth daily.  Marland Kitchen loratadine (CLARITIN) 10 MG tablet Take 10 mg by mouth daily.  Marland Kitchen losartan-hydrochlorothiazide (HYZAAR) 100-25 MG per tablet Take 1 tablet by mouth daily.  Marland Kitchen oxyCODONE (OXY  IR/ROXICODONE) 5 MG immediate release tablet Take 0.5-1 tablets (2.5-5 mg total) by mouth every 6 (six) hours as needed for severe pain.  . rosuvastatin (CRESTOR) 20 MG tablet  Take 20 mg by mouth daily.   No facility-administered encounter medications on file as of 07/19/2019.     ONCOLOGIC FAMILY HISTORY:  Family History  Problem Relation Age of Onset  . Breast cancer Mother   . Lung cancer Father      GENETIC COUNSELING/TESTING: Not at this time  SOCIAL HISTORY:  Social History   Socioeconomic History  . Marital status: Widowed    Spouse name: Not on file  . Number of children: Not on file  . Years of education: Not on file  . Highest education level: Not on file  Occupational History  . Not on file  Tobacco Use  . Smoking status: Former Research scientist (life sciences)  . Smokeless tobacco: Never Used  Substance and Sexual Activity  . Alcohol use: No  . Drug use: No  . Sexual activity: Never  Other Topics Concern  . Not on file  Social History Narrative  . Not on file   Social Determinants of Health   Financial Resource Strain:   . Difficulty of Paying Living Expenses:   Food Insecurity:   . Worried About Charity fundraiser in the Last Year:   . Arboriculturist in the Last Year:   Transportation Needs:   . Film/video editor (Medical):   Marland Kitchen Lack of Transportation (Non-Medical):   Physical Activity:   . Days of Exercise per Week:   . Minutes of Exercise per Session:   Stress:   . Feeling of Stress :   Social Connections:   . Frequency of Communication with Friends and Family:   . Frequency of Social Gatherings with Friends and Family:   . Attends Religious Services:   . Active Member of Clubs or Organizations:   . Attends Archivist Meetings:   Marland Kitchen Marital Status:   Intimate Partner Violence:   . Fear of Current or Ex-Partner:   . Emotionally Abused:   Marland Kitchen Physically Abused:   . Sexually Abused:      OBSERVATIONS/OBJECTIVE:   There were no vitals taken for this visit. GENERAL: Patient is a well appearing female in no acute distress HEENT:  Sclerae anicteric.  Oropharynx clear and moist. No ulcerations or evidence of oropharyngeal  candidiasis. Neck is supple.  NODES:  No cervical, supraclavicular, or axillary lymphadenopathy palpated.  BREAST EXAM:  Deferred. LUNGS:  Clear to auscultation bilaterally.  No wheezes or rhonchi. HEART:  Regular rate and rhythm. No murmur appreciated. ABDOMEN:  Soft, nontender.  Positive, normoactive bowel sounds. No organomegaly palpated. MSK:  No focal spinal tenderness to palpation. Full range of motion bilaterally in the upper extremities. EXTREMITIES:  No peripheral edema.   SKIN:  Clear with no obvious rashes or skin changes. No nail dyscrasia. NEURO:  Nonfocal. Well oriented.  Appropriate affect.    LABORATORY DATA:  None for this visit.  DIAGNOSTIC IMAGING:  None for this visit.      ASSESSMENT AND PLAN:  Ms.. Tirpak is a pleasant 79 y.o. female with Stage IA left breast invasive ductal carcinoma, ER+/PR+/HER2-, diagnosed in 03/2019, treated with lumpectomy, and anti-estrogen therapy with Anastrozole beginning in 05/2019.  She presents to the Survivorship Clinic for our initial meeting and routine follow-up post-completion of treatment for breast cancer.    1. Stage IA left breast cancer:  Ms. Cornacchia is  continuing to recover from definitive treatment for breast cancer. She will follow-up with her medical oncologist, Dr. Ross Ludwig in *** with history and physical exam per surveillance protocol.  She will continue her anti-estrogen therapy with Anastrozole. Thus far, she is tolerating the Anastrozole well, with minimal side effects. She was instructed to make Dr. Lindi Adie or myself aware if she begins to experience any worsening side effects of the medication and I could see her back in clinic to help manage those side effects, as needed. Her mammogram is due 02/2020; orders placed today.  Her breast density is category B. Today, a comprehensive survivorship care plan and treatment summary was reviewed with the patient today detailing her breast cancer diagnosis, treatment  course, potential late/long-term effects of treatment, appropriate follow-up care with recommendations for the future, and patient education resources.  A copy of this summary, along with a letter will be sent to the patient's primary care provider via mail/fax/In Basket message after today's visit.    #. Problem(s) at Visit______________  #. Bone health:  Given Ms. Bueche's age/history of breast cancer and her current treatment regimen including anti-estrogen therapy with Anastrozole, she is at risk for bone demineralization.  Her last DEXA scan was ***, which showed ***.  In the meantime, she was encouraged to increase her consumption of foods rich in calcium, as well as increase her weight-bearing activities.  She was given education on specific activities to promote bone health.  #. Cancer screening:  Due to Ms. Droll's history and her age, she should receive screening for skin cancers, colon cancer, and gynecologic cancers.  The information and recommendations are listed on the patient's comprehensive care plan/treatment summary and were reviewed in detail with the patient.    #. Health maintenance and wellness promotion: Ms. Muldoon was encouraged to consume 5-7 servings of fruits and vegetables per day. We reviewed the "Nutrition Rainbow" handout, as well as the handout "Take Control of Your Health and Reduce Your Cancer Risk" from the [employer].  She was also encouraged to engage in moderate to vigorous exercise for 30 minutes per day most days of the week. We discussed the [employer] fitness program, which is designed for cancer survivors to help them become more physically fit after cancer treatments.  She was instructed to limit her alcohol consumption and continue to abstain from tobacco use/***was encouraged stop smoking.     #. Support services/counseling: It is not uncommon for this period of the patient's cancer care trajectory to be one of many emotions and stressors.  We  discussed how this can be increasingly difficult during the times of quarantine and social distancing due to the COVID-19 pandemic.   She was given information regarding our available services and encouraged to contact me with any questions or for help enrolling in any of our support group/programs.    Follow up instructions:    -Return to cancer center in 6 months for f/u with Dr. Lindi Adie  -Mammogram due in 02/2020 -Bone density -Follow up with surgery *** -She is welcome to return back to the Survivorship Clinic at any time; no additional follow-up needed at this time.  -Consider referral back to survivorship as a long-term survivor for continued surveillance  The patient was provided an opportunity to ask questions and all were answered. The patient agreed with the plan and demonstrated an understanding of the instructions.   Total encounter time: *** minutes  Wilber Bihari, NP 07/18/19 1:56 PM Medical Oncology and Hematology [facility]  [address], [place] [address] Tel. [phone]    Fax. [phone]  *Total Encounter Time as defined by the Centers for Medicare and Medicaid Services includes, in addition to the face-to-face time of a patient visit (documented in the note above) non-face-to-face time: obtaining and reviewing outside history, ordering and reviewing medications, tests or procedures, care coordination (communications with other health care professionals or caregivers) and documentation in the medical record.

## 2019-07-19 ENCOUNTER — Telehealth: Payer: Self-pay | Admitting: Adult Health

## 2019-07-19 ENCOUNTER — Inpatient Hospital Stay: Payer: Medicare HMO | Admitting: Adult Health

## 2019-07-19 NOTE — Telephone Encounter (Signed)
R/s app tper 3/30 sch message - left message and mailed reminder letter

## 2019-07-27 ENCOUNTER — Ambulatory Visit: Payer: Medicare HMO | Attending: Internal Medicine

## 2019-07-27 DIAGNOSIS — Z23 Encounter for immunization: Secondary | ICD-10-CM

## 2019-07-27 NOTE — Progress Notes (Signed)
   Covid-19 Vaccination Clinic  Name:  Nicole Rush    MRN: [id number] DOB: 01/27/1941  07/27/2019  [name] was observed post Covid-19 immunization for 15 minutes without incident. She was provided with Vaccine Information Sheet and instruction to access the V-Safe system.   [name] was instructed to call 911 with any severe reactions post vaccine: Marland Kitchen Difficulty breathing  . Swelling of face and throat  . A fast heartbeat  . A bad rash all over body  . Dizziness and weakness   Immunizations Administered    Name Date Dose VIS Date Route   Pfizer COVID-19 Vaccine 07/27/2019  9:00 AM 0.3 mL 04/01/2019 Intramuscular   Manufacturer: [employer], [employer]   Lot: [id number]   [place]: [id number]

## 2019-08-03 ENCOUNTER — Inpatient Hospital Stay: Payer: Medicare HMO | Attending: Adult Health | Admitting: Adult Health

## 2019-08-03 DIAGNOSIS — C50412 Malignant neoplasm of upper-outer quadrant of left female breast: Secondary | ICD-10-CM | POA: Diagnosis not present

## 2019-08-03 DIAGNOSIS — E2839 Other primary ovarian failure: Secondary | ICD-10-CM

## 2019-08-03 DIAGNOSIS — Z170 Estrogen receptor positive status [ER+]: Secondary | ICD-10-CM

## 2019-08-03 NOTE — Progress Notes (Signed)
SURVIVORSHIP VIRTUAL VISIT:  I connected with Nicole Rush on 08/03/19 at 11:00 AM EDT by telephone and verified that I am speaking with the correct person using two identifiers.  I discussed the limitations, risks, security and privacy concerns of performing an evaluation and management service by telephone and the availability of in person appointments. I also discussed with the patient that there may be a patient responsible charge related to this service. The patient expressed understanding and agreed to proceed.   BRIEF ONCOLOGIC HISTORY:  Oncology History  Malignant neoplasm of upper-outer quadrant of left breast in female, estrogen receptor positive ([place])  03/22/2019 Initial Diagnosis   Patient palpated a left breast mass. Mammogram showed two cysts in the right breast. In the left breast, a 1.3cm mass at the 2 o'clock position with associated calcifications spanning in total 2.0cm, no abnormal lymph nodes. 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    04/13/2019 Cancer Staging   Staging form: Breast, AJCC 8th Edition - Pathologic stage from 04/13/2019: Stage IA (pT1c, pN0, cM0, G2, ER+, PR+, HER2-)    05/2019 - 05/2024 Anti-estrogen oral therapy   Anastrozole     INTERVAL HISTORY:  Nicole Rush to review her survivorship care plan detailing her treatment course for breast cancer, as well as monitoring long-term side effects of that treatment, education regarding health maintenance, screening, and overall wellness and health promotion.     Overall, Nicole Rush reports feeling moderately well.  She is taking anastrozole daily and is tolerating it well.  She is  fatigued, and notes this started after she was diagnosed with breast cancer.  She says that she either has to sit down or lay down, because she gets so tired.    She notes a newer numbness in her left sided fingers since she received her covid vaccine last week.  She says she has felt very sick.  She does have difficulty sleeping.  She notes that she felt like she was hit upside her head on the right side and then felt like something was running down the side of her face.  She notes she was bleeding this morning with a nose bleed, her mouth bleeding, and backside bleeding.  She says that it was a little amount.    REVIEW OF SYSTEMS:  Review of Systems  Constitutional: Positive for fatigue. Negative for appetite change, chills, fever and unexpected weight change.  HENT:   Negative for hearing loss, lump/mass, mouth sores and trouble swallowing.   Eyes: Negative for eye problems and icterus.  Respiratory: Negative for chest tightness, cough and shortness of breath.   Cardiovascular: Negative for chest pain, leg swelling and palpitations.  Gastrointestinal: Negative for abdominal distention, abdominal pain and constipation.  Endocrine: Negative for hot flashes.  Genitourinary: Negative for difficulty urinating.   Musculoskeletal: Negative for arthralgias and back pain.  Skin: Negative for itching and rash.  Neurological: Positive for headaches and numbness. Negative for dizziness and extremity weakness.  Hematological: Negative for adenopathy. Does not bruise/bleed easily.  Psychiatric/Behavioral: Negative for depression. The patient is not nervous/anxious.   Breast: Denies any new nodularity, masses, tenderness, nipple changes, or nipple discharge.      ONCOLOGY TREATMENT TEAM:  1. Surgeon:  Dr. Barry Dienes at [facility] 2. Medical Oncologist: Dr. Lindi Adie 3. Radiation Oncologist: Dr. Lisbeth Renshaw    PAST MEDICAL/SURGICAL HISTORY:  Past Medical History:  Diagnosis Date  . Asthma   .  Cancer ([place]) 03/2019   left breast DCIS  . Hypertension   . Stroke [facility])    per pt no defecits   Past Surgical History:  Procedure Laterality Date  . BREAST LUMPECTOMY WITH AXILLARY LYMPH NODE BIOPSY Left 04/06/2019   Procedure: LEFT BREAST LUMPECTOMY WITH LEFT SENTINEL LYMPH NODE BIOPSY;  Surgeon: [name], MD;  Location: [facility];  Service: General;  Laterality: Left;  . BREAST SURGERY    . RE-EXCISION OF BREAST LUMPECTOMY Left 05/04/2019   Procedure: RE-EXCISION OF LEFT BREAST LUMPECTOMY;  Surgeon: [name], MD;  Location: [facility];  Service: General;  Laterality: Left;     ALLERGIES:  No Known Allergies   CURRENT MEDICATIONS:  Outpatient Encounter Medications as of 08/03/2019  Medication Sig  . anastrozole (ARIMIDEX) 1 MG tablet Take 1 tablet (1 mg total) by mouth daily.  Marland Kitchen loratadine (CLARITIN) 10 MG tablet Take 10 mg by mouth daily.  Marland Kitchen losartan-hydrochlorothiazide (HYZAAR) 100-25 MG per tablet Take 1 tablet by mouth daily.  Marland Kitchen oxyCODONE (OXY IR/ROXICODONE) 5 MG immediate release tablet Take 0.5-1 tablets (2.5-5 mg total) by mouth every 6 (six) hours as needed for severe pain.  . rosuvastatin (CRESTOR) 20 MG tablet Take 20 mg by mouth daily.   No facility-administered encounter medications on file as of 08/03/2019.     ONCOLOGIC FAMILY HISTORY:  Family History  Problem Relation Age of Onset  . Breast cancer Mother   . Lung cancer Father      GENETIC COUNSELING/TESTING: Not at this time  SOCIAL HISTORY:  Social History   Socioeconomic History  . Marital status: Widowed    Spouse name: Not on file  . Number of children: Not on file  . Years of education: Not on file  . Highest education level: Not on file  Occupational History  . Not on file  Tobacco Use  . Smoking status: Former Research scientist (life sciences)  . Smokeless tobacco: Never Used  Substance and Sexual Activity  . Alcohol use: No  . Drug use: No  . Sexual activity: Never   Other Topics Concern  . Not on file  Social History Narrative  . Not on file   Social Determinants of Health   Financial Resource Strain:   . Difficulty of Paying Living Expenses:   Food Insecurity:   . Worried About Charity fundraiser in the Last Year:   . Arboriculturist in the Last Year:   Transportation Needs:   . Film/video editor (Medical):   Marland Kitchen Lack of Transportation (Non-Medical):   Physical Activity:   . Days of Exercise per Week:   . Minutes of Exercise per Session:   Stress:   . Feeling of Stress :   Social Connections:   . Frequency of Communication with Friends and Family:   . Frequency of Social Gatherings with Friends and Family:   . Attends Religious Services:   . Active Member of Clubs or Organizations:   . Attends Archivist Meetings:   Marland Kitchen Marital Status:   Intimate Partner Violence:   . Fear of Current or Ex-Partner:   . Emotionally Abused:   Marland Kitchen Physically Abused:   . Sexually Abused:      OBSERVATIONS/OBJECTIVE:  Patient sounds well.  She is in no apparent distress, her breathing is not labored.  Her speech, mood and behavior are normal.    LABORATORY DATA:  None for this visit.  DIAGNOSTIC IMAGING:  None for this visit.      ASSESSMENT AND PLAN:  Ms.. Rush is a pleasant 79 y.o. female with Stage IA left breast invasive ductal carcinoma, ER+/PR+/HER2-, diagnosed in 03/2019, treated with lumpectomy and anti-estrogen therapy with Anastrozole beginning in 05/2019.  She presents to the Survivorship Clinic for our initial meeting and routine follow-up post-completion of treatment for breast cancer.    1. Stage IA left breast cancer:  Nicole Rush is continuing to recover from definitive treatment for breast cancer. She will follow-up with her medical oncologist, Dr. Lindi Adie in 3 months with history and physical exam per surveillance protocol.  She will continue her anti-estrogen therapy with Anastrozole. Thus far, she is tolerating the  Anastrozole well, with minimal side effects. . Her mammogram is due 02/2020; orders placed today.   Today, a comprehensive survivorship care plan and treatment summary was reviewed with the patient today detailing her breast cancer diagnosis, treatment course, potential late/long-term effects of treatment, appropriate follow-up care with recommendations for the future, and patient education resources.  A copy of this summary, along with a letter will be sent to the patient's primary care provider via mail/fax/In Basket message after today's visit.    2. Left sided numbness following right sided headache: It was hard to tell how intense these symptoms were for Nicole Rush.  I recommended that she please, seek evaluation in person at urgent care, or her PCP and have some labs drawn.  She let me know that she understood, and that she also has her button to push if she needs to call EMS.    3. Bone health:  Given Nicole Rush's age/history of breast cancer and her current treatment regimen including anti-estrogen therapy with Anastrozole, she is at risk for bone demineralization.  Nicole Rush cannot recall when her last bone density was completed, so I ordered one to be completed when she has her mammogram.  She was given education on specific activities to promote bone health.  4. Cancer screening:  Due to Nicole Rush's history and her age, she should receive screening for skin cancers, colon cancer, and gynecologic cancers.  The information and recommendations are listed on the patient's comprehensive care plan/treatment summary and were reviewed in detail with the patient.    5. Health maintenance and wellness promotion: Nicole Rush was encouraged to consume 5-7 servings of fruits and vegetables per day. We reviewed the "Nutrition Rainbow" handout, as well as the handout "Take Control of Your Health and Reduce Your Cancer Risk" from the [employer].  She was also encouraged to engage in moderate to vigorous  exercise for 30 minutes per day most days of the week. We discussed the [employer] fitness program, which is designed for cancer survivors to help them become more physically fit after cancer treatments.  She was instructed to limit her alcohol consumption and continue to abstain from tobacco use.     6. Support services/counseling: It is not uncommon for this period of the patient's cancer care trajectory to be one of many emotions and stressors.  We discussed how this can be increasingly difficult during the times of quarantine and social distancing due to the COVID-19 pandemic.   She was given information regarding our available services and encouraged to contact me with any questions or for help  enrolling in any of our support group/programs.    Follow up instructions:    -Return to cancer center in 3 months for f/u with Dr. Lindi Adie  -Mammogram due in 02/2020 -Bone density in 02/2020 -Follow up with surgery 04/2020 -She is welcome to return back to the Survivorship Clinic at any time; no additional follow-up needed at this time.  -Consider referral back to survivorship as a long-term survivor for continued surveillance  The patient was provided an opportunity to ask questions and all were answered. The patient agreed with the plan and demonstrated an understanding of the instructions.   The patient was advised to call back or seek an in-person evaluation if the symptoms worsen or if the condition fails to improve as anticipated.   I provided 21 minutes of telephone time during this encounter, and > 50% was spent counseling as documented under my assessment & plan.  Wilber Bihari, NP 08/03/19 9:11 AM Medical Oncology and Hematology [facility] [place], [place] [address] Tel. [phone]    Fax. [phone]  *Total Encounter Time as defined by the Centers for Medicare and Medicaid Services includes, in addition to the face-to-face time of a patient visit  (documented in the note above) non-face-to-face time: obtaining and reviewing outside history, ordering and reviewing medications, tests or procedures, care coordination (communications with other health care professionals or caregivers) and documentation in the medical record.

## 2019-08-04 ENCOUNTER — Telehealth: Payer: Self-pay | Admitting: Hematology and Oncology

## 2019-08-04 NOTE — Telephone Encounter (Signed)
Scheduled appt per 4/14 los. Left voicemail with new appt date and time. Sent HIM a msg to Journalist, newspaper.

## 2019-11-06 NOTE — Progress Notes (Signed)
° °Patient Care Team: °Reese, Betti, MD as PCP - General (Family Medicine) °Gudena, Vinay, MD as Consulting Physician (Hematology and Oncology) °Moody, John, MD as Consulting Physician (Radiation Oncology) °Byerly, Faera, MD as Consulting Physician (General Surgery) ° °DIAGNOSIS:  °  ICD-10-CM   °1. Malignant neoplasm of upper-outer quadrant of left breast in female, estrogen receptor positive ([facility])  C50.412   ° Z17.0   ° ° °SUMMARY OF ONCOLOGIC HISTORY: °Oncology History  °Malignant neoplasm of upper-outer quadrant of left breast in female, estrogen receptor positive ([facility])  °03/22/2019 Initial Diagnosis  ° Patient palpated a left breast mass. Mammogram showed two cysts in the right breast. In the left breast, a 1.3cm mass at the 2 o'clock position with associated calcifications spanning in total 2.0cm, no abnormal lymph nodes. 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  °  °04/13/2019 Cancer Staging  ° Staging form: Breast, AJCC 8th Edition °- Pathologic stage from 04/13/2019: Stage IA (pT1c, pN0, cM0, G2, ER+, PR+, HER2-)  °  °05/2019 - 05/2024 Anti-estrogen oral therapy  ° Anastrozole °  ° ° °CHIEF COMPLIANT: Follow-up of left breast cancer on anastrozole  ° °INTERVAL HISTORY: Nicole Rush is a 79 y.o. with above-mentioned history of left breast cancer who underwent a left lumpectomy and is currently on antiestrogen therapy with anastrozole. She presents to the clinic today for follow-up.  She is tolerating anastrozole extremely well without any problems or concerns.  Her biggest issue is intermittent urinary frequency and occasional burning.  Denies any fevers or  chills. ° °ALLERGIES:  has No Known Allergies. ° °MEDICATIONS:  °Current Outpatient Medications  °Medication Sig Dispense Refill  °• anastrozole (ARIMIDEX) 1 MG tablet Take 1 tablet (1 mg total) by mouth daily. 90 tablet 1  °• loratadine (CLARITIN) 10 MG tablet Take 10 mg by mouth daily.    °• losartan-hydrochlorothiazide (HYZAAR) 100-25 MG per tablet Take 1 tablet by mouth daily.    °• oxyCODONE (OXY IR/ROXICODONE) 5 MG immediate release tablet Take 0.5-1 tablets (2.5-5 mg total) by mouth every 6 (six) hours as needed for severe pain. 15 tablet 0  °• rosuvastatin (CRESTOR) 20 MG tablet Take 20 mg by mouth daily.    ° °No current facility-administered medications for this visit.  ° ° °PHYSICAL EXAMINATION: °ECOG PERFORMANCE STATUS: 1 - Symptomatic but completely ambulatory ° °Vitals:  ° 11/07/19 1122  °BP: 139/85  °Pulse: 78  °Resp: 18  °Temp: 98.5 °F (36.9 °C)  °SpO2: 99%  ° °Filed Weights  ° 11/07/19 1122  °Weight: 100 lb 3.2 oz (45.5 kg)  ° ° °BREAST: No palpable masses or nodules in either right or left breasts. No palpable axillary supraclavicular or infraclavicular adenopathy no breast tenderness or nipple discharge. (exam performed in the presence of a chaperone) ° °LABORATORY DATA:  °I have reviewed the data as listed °CMP Latest Ref Rng & Units 05/02/2019 03/23/2019 08/11/2012  °Glucose 70 - 99 mg/dL 89 89 109(H)  °BUN 8 - 23 mg/dL 22 25(H) 15  °Creatinine 0.44 - 1.00 mg/dL 0.65 0.77 0.87  °Sodium 135 - 145 mmol/L 139 140 137  °Potassium 3.5 - 5.1 mmol/L 3.8 4.0 4.0  °Chloride 98 - 111 mmol/L 106 106   102  °CO2 22 - 32 mmol/L 25 25 26  °Calcium 8.9 - 10.3 mg/dL 9.5 9.0 9.3  °Total Protein 6.5 - 8.1 g/dL - 7.2 -  °Total Bilirubin 0.3 - 1.2 mg/dL - 0.4 -  °Alkaline Phos 38 - 126 U/L - 60 -  °AST 15 - 41 U/L - 21 -  °ALT 0 - 44 U/L - 21 -  ° ° °Lab Results  °Component Value Date  ° WBC 5.9 03/23/2019  ° HGB 11.2 (L) 03/23/2019  ° HCT 33.8 (L) 03/23/2019  ° MCV 80.3 03/23/2019  ° PLT 312 03/23/2019  ° NEUTROABS 3.5  03/23/2019  ° ° °ASSESSMENT & PLAN:  °Malignant neoplasm of upper-outer quadrant of left breast in female, estrogen receptor positive ([facility]) °04/06/2019:Left lumpectomy (Byerly): IDC, grade 2, 1.4cm, with intermediate grade DCIS, medial margin involved by DCIS, 2 left axillary lymph nodes negative ER 95%, PR 90%, Ki-67 10%, HER-2 -1+ °T1c N0 stage Ia °Did not receive radiation because of her age and favorable prognostic factors. °Current treatment: Anastrozole 1 mg daily started 04/13/2019 ° °Anastrozole toxicities: °Denies any adverse effects of anastrozole therapy.  Denies any hot flashes or arthralgias or myalgias. °She does have urinary frequency but it is unrelated to anastrozole. °I discussed with her that she needs to increase her water intake. ° °Breast cancer surveillance: °1.  Breast exam 11/07/2019: Benign °2. mammogram: Will need to be done November 2021 °Breast MRI was attempted with motion artifact and patient declined a repeat MRI. ° °Return to clinic in 1 year for follow-up ° ° ° °No orders of the defined types were placed in this encounter. ° °The patient has a good understanding of the overall plan. she agrees with it. she will call with any problems that may develop before the next visit here. ° °Total time spent: 20 mins including face to face time and time spent for planning, charting and coordination of care ° °Gudena, Vinay, MD °11/07/2019 ° °I, Molly Dorshimer, am acting as scribe for Dr. Vinay Gudena. ° °I have reviewed the above documentation for accuracy and completeness, and I agree with the above. ° ° ° ° ° ° °

## 2019-11-07 ENCOUNTER — Inpatient Hospital Stay: Payer: Medicare HMO | Attending: Hematology and Oncology | Admitting: Hematology and Oncology

## 2019-11-07 ENCOUNTER — Other Ambulatory Visit: Payer: Self-pay

## 2019-11-07 DIAGNOSIS — Z170 Estrogen receptor positive status [ER+]: Secondary | ICD-10-CM | POA: Insufficient documentation

## 2019-11-07 DIAGNOSIS — Z79811 Long term (current) use of aromatase inhibitors: Secondary | ICD-10-CM | POA: Insufficient documentation

## 2019-11-07 DIAGNOSIS — C50412 Malignant neoplasm of upper-outer quadrant of left female breast: Secondary | ICD-10-CM | POA: Diagnosis not present

## 2019-11-07 DIAGNOSIS — R350 Frequency of micturition: Secondary | ICD-10-CM | POA: Diagnosis not present

## 2019-11-07 NOTE — Assessment & Plan Note (Signed)
04/06/2019:Left lumpectomy (Byerly): IDC, grade 2, 1.4cm, with intermediate grade DCIS, medial margin involved by DCIS, 2 left axillary lymph nodes negative ER 95%, PR 90%, Ki-67 10%, HER-2 -1+ T1c N0 stage Ia Did not receive radiation because of her age and favorable prognostic factors. Current treatment: Anastrozole 1 mg daily started 04/13/2019  Anastrozole toxicities:  Breast cancer surveillance: 1.  Breast exam 11/07/2019: Benign 2. mammogram: Will need to be done November 2021 Breast MRI was attempted with motion artifact and patient declined a repeat MRI.  Return to clinic in 1 year for follow-up

## 2020-07-11 ENCOUNTER — Other Ambulatory Visit: Payer: Self-pay | Admitting: Hematology and Oncology

## 2020-11-05 NOTE — Progress Notes (Signed)
Patient Care Team: Lin Landsman, MD as PCP - General (Family Medicine) Nicholas Lose, MD as Consulting Physician (Hematology and Oncology) Kyung Rudd, MD as Consulting Physician (Radiation Oncology) [name], MD as Consulting Physician (General Surgery)  DIAGNOSIS:    ICD-10-CM   1. Malignant neoplasm of upper-outer quadrant of left breast in female, estrogen receptor positive ([place])  C50.412    Z17.0       SUMMARY OF ONCOLOGIC HISTORY: Oncology History  Malignant neoplasm of upper-outer quadrant of left breast in female, estrogen receptor positive ([place])  03/22/2019 Initial Diagnosis   Patient palpated a left breast mass. Mammogram showed two cysts in the right breast. In the left breast, a 1.3cm mass at the 2 o'clock position with associated calcifications spanning in total 2.0cm, no abnormal lymph nodes. 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    04/06/2019 Surgery   Left lumpectomy Barry Dienes) [phone]): IDC, grade 2, 1.4cm, with intermediate grade DCIS, medial margin involved by DCIS, 2 left axillary lymph nodes negative  05/04/2019 left medial and posterior margin re-excision (Byerly) ([id number]): Negative margins.    04/13/2019 Cancer Staging   Staging form: Breast, AJCC 8th Edition - Pathologic stage from 04/13/2019: Stage IA (pT1c, pN0, cM0, G2, ER+, PR+, HER2-)    05/2019 - 05/2024 Anti-estrogen oral therapy   Anastrozole     CHIEF COMPLIANT: Follow-up of left breast cancer on anastrozole   INTERVAL HISTORY: Nicole Rush is a 80 y.o. with above-mentioned history of left breast cancer who underwent a left lumpectomy and is currently on antiestrogen therapy with anastrozole. She presents to the clinic today for follow-up.  She tells me that she does not want to get any more mammograms.  She denies any pain or discomfort in the breast.  She reports to be taking the anastrozole without any problems or  concerns.  Denies any hot flashes or arthralgias or myalgias.  She does not want to do any further mammograms.  ALLERGIES:  has No Known Allergies.  MEDICATIONS:  Current Outpatient Medications  Medication Sig Dispense Refill   anastrozole (ARIMIDEX) 1 MG tablet TAKE 1 TABLET BY MOUTH EVERY DAY 90 tablet 1   loratadine (CLARITIN) 10 MG tablet Take 10 mg by mouth daily.     losartan-hydrochlorothiazide (HYZAAR) 100-25 MG per tablet Take 1 tablet by mouth daily.     oxyCODONE (OXY IR/ROXICODONE) 5 MG immediate release tablet Take 0.5-1 tablets (2.5-5 mg total) by mouth every 6 (six) hours as needed for severe pain. 15 tablet 0   rosuvastatin (CRESTOR) 20 MG tablet Take 20 mg by mouth daily.     No current facility-administered medications for this visit.    PHYSICAL EXAMINATION: ECOG PERFORMANCE STATUS: 1 - Symptomatic but completely ambulatory  Vitals:   11/06/20 0850  BP: 139/72  Pulse: 83  Resp: 18  Temp: 97.8 F (36.6 C)  SpO2: 100%   Filed Weights   11/06/20 0850  Weight: 104 lb 7 oz (47.4 kg)    BREAST: No palpable masses or nodules in either right or left breasts. No palpable axillary supraclavicular or infraclavicular adenopathy no breast tenderness or nipple discharge. (exam performed in the presence of a chaperone)  LABORATORY DATA:  I have reviewed the data as listed CMP Latest Ref Rng & Units 05/02/2019 03/23/2019 08/11/2012  Glucose 70 - 99 mg/dL 89 89 109(H)  BUN 8 - 23 mg/dL 22 25(H) 15  Creatinine 0.44 - 1.00 mg/dL 0.65 0.77  0.87  Sodium 135 - 145 mmol/L 139 140 137  Potassium 3.5 - 5.1 mmol/L 3.8 4.0 4.0  Chloride 98 - 111 mmol/L 106 106 102  CO2 22 - 32 mmol/L _0 Calcium 8.9 - 10.3 mg/dL 9.5 9.0 9.3  Total Protein 6.5 - 8.1 g/dL - 7.2 -  Total Bilirubin 0.3 - 1.2 mg/dL - 0.4 -  Alkaline Phos 38 - 126 U/L - 60 -  AST 15 - 41 U/L - 21 -  ALT 0 - 44 U/L - 21 -    Lab Results  Component Value Date   WBC 5.9 03/23/2019   HGB 11.2 (L) 03/23/2019    HCT 33.8 (L) 03/23/2019   MCV 80.3 03/23/2019   PLT 312 03/23/2019   NEUTROABS 3.5 03/23/2019    ASSESSMENT & PLAN:  Malignant neoplasm of upper-outer quadrant of left breast in female, estrogen receptor positive ([place]) 04/06/2019:Left lumpectomy (Byerly): IDC, grade 2, 1.4cm, with intermediate grade DCIS, medial margin involved by DCIS, 2 left axillary lymph nodes negative ER 95%, PR 90%, Ki-67 10%, HER-2 -1+ T1c N0 stage Ia Did not receive radiation because of her age and favorable prognostic factors.  Current treatment: Anastrozole 1 mg daily started 04/13/2019   Anastrozole toxicities: Denies any adverse effects of anastrozole therapy.  Denies any hot flashes or arthralgias or myalgias.   Breast cancer surveillance: 1.  Breast exam 11/06/2020: Benign 2. mammogram: Patient does not want to do any further mammograms.  This is by her choice.  Breast MRI was attempted 05/23/2019 with motion artifact and patient declined a repeat MRI.   Return to clinic in 1 year for follow-up    No orders of the defined types were placed in this encounter.  The patient has a good understanding of the overall plan. she agrees with it. she will call with any problems that may develop before the next visit here.  Total time spent: 20 mins including face to face time and time spent for planning, charting and coordination of care  Rulon Eisenmenger, MD, MPH 11/06/2020  I, Thana Ates, am acting as scribe for Dr. Nicholas Lose.  I have reviewed the above documentation for accuracy and completeness, and I agree with the above.

## 2020-11-06 ENCOUNTER — Inpatient Hospital Stay: Payer: Medicare HMO | Attending: Hematology and Oncology | Admitting: Hematology and Oncology

## 2020-11-06 ENCOUNTER — Other Ambulatory Visit: Payer: Self-pay

## 2020-11-06 DIAGNOSIS — C50412 Malignant neoplasm of upper-outer quadrant of left female breast: Secondary | ICD-10-CM | POA: Diagnosis present

## 2020-11-06 DIAGNOSIS — Z170 Estrogen receptor positive status [ER+]: Secondary | ICD-10-CM | POA: Diagnosis not present

## 2020-11-06 MED ORDER — ANASTROZOLE 1 MG PO TABS: 1.0000 mg | ORAL_TABLET | Freq: Every day | ORAL | 3 refills | Status: DC

## 2020-11-06 NOTE — Assessment & Plan Note (Signed)
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 Current treatment: Anastrozole 1 mg daily started 04/13/2019  Anastrozole toxicities: Denies any adverse effects of anastrozole therapy.  Denies any hot flashes or arthralgias or myalgias. She does have urinary frequency but it is unrelated to anastrozole. I discussed with her that she needs to increase her water intake.  Breast cancer surveillance: 1.  Breast exam 11/06/2020: Benign 2. mammogram:   Breast MRI was attempted 05/23/2019 with motion artifact and patient declined a repeat MRI.  Return to clinic in 1 year for follow-up

## 2021-03-17 ENCOUNTER — Other Ambulatory Visit: Payer: Self-pay

## 2021-03-17 ENCOUNTER — Encounter (HOSPITAL_COMMUNITY): Payer: Self-pay

## 2021-03-17 ENCOUNTER — Emergency Department (HOSPITAL_COMMUNITY)
Admission: EM | Admit: 2021-03-17 | Discharge: 2021-03-17 | Disposition: A | Discharge status: Home / Self Care | Payer: Medicare HMO | Attending: Emergency Medicine | Admitting: Emergency Medicine

## 2021-03-17 DIAGNOSIS — Z853 Personal history of malignant neoplasm of breast: Secondary | ICD-10-CM | POA: Insufficient documentation

## 2021-03-17 DIAGNOSIS — Z87891 Personal history of nicotine dependence: Secondary | ICD-10-CM | POA: Insufficient documentation

## 2021-03-17 DIAGNOSIS — I10 Essential (primary) hypertension: Secondary | ICD-10-CM | POA: Diagnosis not present

## 2021-03-17 DIAGNOSIS — Z79899 Other long term (current) drug therapy: Secondary | ICD-10-CM | POA: Insufficient documentation

## 2021-03-17 DIAGNOSIS — J101 Influenza due to other identified influenza virus with other respiratory manifestations: Secondary | ICD-10-CM | POA: Diagnosis not present

## 2021-03-17 DIAGNOSIS — R059 Cough, unspecified: Secondary | ICD-10-CM | POA: Diagnosis present

## 2021-03-17 DIAGNOSIS — Z20822 Contact with and (suspected) exposure to covid-19: Secondary | ICD-10-CM | POA: Insufficient documentation

## 2021-03-17 DIAGNOSIS — J45909 Unspecified asthma, uncomplicated: Secondary | ICD-10-CM | POA: Diagnosis not present

## 2021-03-17 LAB — RESP PANEL BY RT-PCR (FLU A&B, COVID) ARPGX2
Influenza A by PCR: POSITIVE — AB
Influenza B by PCR: NEGATIVE
SARS Coronavirus 2 by RT PCR: NEGATIVE

## 2021-03-17 MED ORDER — ACETAMINOPHEN 325 MG PO TABS
650.0000 mg | ORAL_TABLET | Freq: Once | ORAL | Status: AC
  Administered 2021-03-17: 02:00:00 650 mg via ORAL
  Filled 2021-03-17: qty 2

## 2021-03-17 MED ORDER — OSELTAMIVIR PHOSPHATE 75 MG PO CAPS: 75.0000 mg | ORAL_CAPSULE | Freq: Two times a day (BID) | ORAL | 0 refills | Status: DC

## 2021-03-17 NOTE — ED Triage Notes (Signed)
Pt BIB EMS from home. Pt reports with congestion, generalized body aches, and chills x 4 days.

## 2021-03-17 NOTE — ED Notes (Signed)
Pt's daughters called about transporting patient from ED back home, no answer. Neither daughter has a working Advertising account executive. Will re attempt at a later time to reach pt's daughters.

## 2021-03-17 NOTE — Discharge Instructions (Addendum)
You have tested positive for the flu today.  You have been prescribed Tamiflu for management of your symptoms.  Take as prescribed.  Take tylenol for fever, headaches, body aches. Drink plenty of fluids to prevent dehydration. You may continue to use other over-the-counter remedies for symptom control, if desired. Return for new or concerning symptoms such as worsening shortness of breath, coughing up blood, persistent vomiting, loss of consciousness.

## 2021-03-17 NOTE — ED Provider Notes (Signed)
[facility] DEPT Provider Note   CSN: [id number] Arrival date & time: 03/17/21  0142     History Chief Complaint  Patient presents with   Generalized Body Aches   Chills   Nasal Congestion    Nicole Rush is a 80 y.o. female.  80 year old female with a history of left breast cancer (in remission), hypertension, CVA presents to the emergency department for URI symptoms x4 days.  She reports nasal congestion, cough, generalized body aches.  She reports subjective fever and chills tonight.  Denies using any over-the-counter medications for management of her symptoms.  Felt that her URI symptoms and chills acutely worsened this evening prompting her to press her medical alert button.  She was transported to the ED via EMS.  States that she was around her grandchild who had URI symptoms recently.  No other known sick contacts.  She presently states that she is feeling better since receiving Tylenol in triage.  The history is provided by the patient. No language interpreter was used.      Past Medical History:  Diagnosis Date   Asthma    Cancer ([place]) 03/2019   left breast DCIS   Hypertension    Stroke [facility])    per pt no defecits    Patient Active Problem List   Diagnosis Date Noted   Malignant neoplasm of upper-outer quadrant of left breast in female, estrogen receptor positive ([place]) 03/22/2019   Chest pain 08/11/2012   GERD (gastroesophageal reflux disease) 08/11/2012   Generalized anxiety disorder 08/11/2012    Past Surgical History:  Procedure Laterality Date   BREAST LUMPECTOMY WITH AXILLARY LYMPH NODE BIOPSY Left 04/06/2019   Procedure: LEFT BREAST LUMPECTOMY WITH LEFT SENTINEL LYMPH NODE BIOPSY;  Surgeon: [name], MD;  Location: [facility];  Service: General;  Laterality: Left;   BREAST SURGERY     RE-EXCISION OF BREAST LUMPECTOMY Left 05/04/2019   Procedure: RE-EXCISION OF LEFT BREAST LUMPECTOMY;  Surgeon:  [name], MD;  Location: [facility];  Service: General;  Laterality: Left;     OB History   No obstetric history on file.     Family History  Problem Relation Age of Onset   Breast cancer Mother    Lung cancer Father     Social History   Tobacco Use   Smoking status: Former   Smokeless tobacco: Never  Scientific laboratory technician Use: Never used  Substance Use Topics   Alcohol use: No   Drug use: No    Home Medications Prior to Admission medications   Medication Sig Start Date End Date Taking? Authorizing Provider  oseltamivir (TAMIFLU) 75 MG capsule Take 1 capsule (75 mg total) by mouth every 12 (twelve) hours. 03/17/21  Yes Antonietta Breach, PA-C  anastrozole (ARIMIDEX) 1 MG tablet Take 1 tablet (1 mg total) by mouth daily. 11/06/20   Nicholas Lose, MD  loratadine (CLARITIN) 10 MG tablet Take 10 mg by mouth daily.    [provider]  losartan-hydrochlorothiazide (HYZAAR) 100-25 MG per tablet Take 1 tablet by mouth daily.    [provider]  rosuvastatin (CRESTOR) 20 MG tablet Take 20 mg by mouth daily. 03/03/19   [provider]    Allergies    Patient has no known allergies.  Review of Systems   Review of Systems Ten systems reviewed and are negative for acute change, except as noted in the HPI.    Physical Exam Updated Vital Signs BP  96/67 (BP Location: Right Arm)   Pulse 89   Temp 99.2 F (37.3 C) (Oral)   Resp (!) 23   SpO2 95%   Physical Exam Vitals and nursing note reviewed.  Constitutional:      General: She is not in acute distress.    Appearance: She is well-developed. She is not diaphoretic.     Comments: Nontoxic appearing and in NAD  HENT:     Head: Normocephalic and atraumatic.  Eyes:     General: No scleral icterus.    Conjunctiva/sclera: Conjunctivae normal.  Cardiovascular:     Rate and Rhythm: Normal rate and regular rhythm.     Pulses: Normal pulses.  Pulmonary:     Effort: Pulmonary effort is  normal. No respiratory distress.     Breath sounds: No stridor. No wheezing or rales.     Comments: Lungs grossly CTAB. Respirations even and unlabored. Abdominal:     General: There is no distension.     Palpations: Abdomen is soft.  Musculoskeletal:        General: Normal range of motion.     Cervical back: Normal range of motion.  Skin:    General: Skin is warm and dry.     Coloration: Skin is not pale.     Findings: No erythema or rash.  Neurological:     Mental Status: She is alert and oriented to person, place, and time.     Coordination: Coordination normal.  Psychiatric:        Behavior: Behavior normal.    ED Results / Procedures / Treatments   Labs (all labs ordered are listed, but only abnormal results are displayed) Labs Reviewed  RESP PANEL BY RT-PCR (FLU A&B, COVID) ARPGX2 - Abnormal; Notable for the following components:      Result Value   Influenza A by PCR POSITIVE (*)    All other components within normal limits    EKG None  Radiology No results found.  Procedures Procedures   Medications Ordered in ED Medications  acetaminophen (TYLENOL) tablet 650 mg (650 mg Oral Given 03/17/21 0159)    ED Course  I have reviewed the triage vital signs and the nursing notes.  Pertinent labs & imaging results that were available during my care of the patient were reviewed by me and considered in my medical decision making (see chart for details).    MDM Rules/Calculators/A&P                           80 year old female presents to the emergency department for URI symptoms x4 days.  Patient febrile on arrival which has defervesced with antipyretics.  Otherwise, reassuring vital signs.  She has tested positive for influenza A in the emergency department today which is consistent with her symptomatic complaints.  Lungs are clear to auscultation on exam.  No hypoxia.    Do not see indication for further emergent intervention at this time.  Will discharge on  course of Tamiflu with the advice to follow-up with her primary care doctor for recheck.  Return precautions discussed with patient and daughter as well as provided at time of discharge.  Patient departed the ED in stable condition.   Final Clinical Impression(s) / ED Diagnoses Final diagnoses:  Influenza A    Rx / DC Orders ED Discharge Orders          Ordered    oseltamivir (TAMIFLU) 75 MG capsule  Every 12 hours  03/17/21 0607             Antonietta Breach, PA-C 03/17/21 [id number]    Veryl Speak, MD 03/17/21 [phone]

## 2021-03-17 NOTE — ED Notes (Signed)
Reached out to all emergency contacts for transport home s/p discharge w/o answer. Will attempt again.

## 2021-03-21 ENCOUNTER — Other Ambulatory Visit: Payer: Self-pay

## 2021-03-21 ENCOUNTER — Emergency Department (HOSPITAL_COMMUNITY): Payer: Medicare HMO

## 2021-03-21 ENCOUNTER — Encounter (HOSPITAL_COMMUNITY): Payer: Self-pay | Admitting: Emergency Medicine

## 2021-03-21 ENCOUNTER — Inpatient Hospital Stay (HOSPITAL_COMMUNITY)
Admission: EM | Admit: 2021-03-21 | Discharge: 2021-04-06 | DRG: 270 | Disposition: A | Discharge status: Home Health | Payer: Medicare HMO | Attending: Internal Medicine | Admitting: Internal Medicine

## 2021-03-21 DIAGNOSIS — R5381 Other malaise: Secondary | ICD-10-CM | POA: Diagnosis not present

## 2021-03-21 DIAGNOSIS — I309 Acute pericarditis, unspecified: Secondary | ICD-10-CM

## 2021-03-21 DIAGNOSIS — J8410 Pulmonary fibrosis, unspecified: Secondary | ICD-10-CM | POA: Diagnosis present

## 2021-03-21 DIAGNOSIS — Z79899 Other long term (current) drug therapy: Secondary | ICD-10-CM

## 2021-03-21 DIAGNOSIS — R509 Fever, unspecified: Secondary | ICD-10-CM

## 2021-03-21 DIAGNOSIS — R000 Tachycardia, unspecified: Secondary | ICD-10-CM | POA: Diagnosis not present

## 2021-03-21 DIAGNOSIS — E876 Hypokalemia: Secondary | ICD-10-CM | POA: Diagnosis not present

## 2021-03-21 DIAGNOSIS — R64 Cachexia: Secondary | ICD-10-CM | POA: Diagnosis present

## 2021-03-21 DIAGNOSIS — I301 Infective pericarditis: Secondary | ICD-10-CM | POA: Diagnosis not present

## 2021-03-21 DIAGNOSIS — I314 Cardiac tamponade: Secondary | ICD-10-CM

## 2021-03-21 DIAGNOSIS — K761 Chronic passive congestion of liver: Secondary | ICD-10-CM | POA: Diagnosis not present

## 2021-03-21 DIAGNOSIS — Z79811 Long term (current) use of aromatase inhibitors: Secondary | ICD-10-CM

## 2021-03-21 DIAGNOSIS — Z8673 Personal history of transient ischemic attack (TIA), and cerebral infarction without residual deficits: Secondary | ICD-10-CM

## 2021-03-21 DIAGNOSIS — I319 Disease of pericardium, unspecified: Secondary | ICD-10-CM | POA: Diagnosis present

## 2021-03-21 DIAGNOSIS — R079 Chest pain, unspecified: Secondary | ICD-10-CM | POA: Diagnosis not present

## 2021-03-21 DIAGNOSIS — E440 Moderate protein-calorie malnutrition: Secondary | ICD-10-CM | POA: Diagnosis present

## 2021-03-21 DIAGNOSIS — D72829 Elevated white blood cell count, unspecified: Secondary | ICD-10-CM | POA: Diagnosis not present

## 2021-03-21 DIAGNOSIS — R0602 Shortness of breath: Secondary | ICD-10-CM

## 2021-03-21 DIAGNOSIS — J9811 Atelectasis: Secondary | ICD-10-CM | POA: Diagnosis present

## 2021-03-21 DIAGNOSIS — D509 Iron deficiency anemia, unspecified: Secondary | ICD-10-CM | POA: Diagnosis present

## 2021-03-21 DIAGNOSIS — E871 Hypo-osmolality and hyponatremia: Secondary | ICD-10-CM | POA: Diagnosis not present

## 2021-03-21 DIAGNOSIS — W19XXXA Unspecified fall, initial encounter: Secondary | ICD-10-CM | POA: Diagnosis present

## 2021-03-21 DIAGNOSIS — J9601 Acute respiratory failure with hypoxia: Secondary | ICD-10-CM | POA: Diagnosis not present

## 2021-03-21 DIAGNOSIS — E872 Acidosis, unspecified: Secondary | ICD-10-CM | POA: Diagnosis present

## 2021-03-21 DIAGNOSIS — R109 Unspecified abdominal pain: Secondary | ICD-10-CM

## 2021-03-21 DIAGNOSIS — E43 Unspecified severe protein-calorie malnutrition: Secondary | ICD-10-CM | POA: Insufficient documentation

## 2021-03-21 DIAGNOSIS — D638 Anemia in other chronic diseases classified elsewhere: Secondary | ICD-10-CM | POA: Diagnosis present

## 2021-03-21 DIAGNOSIS — I10 Essential (primary) hypertension: Secondary | ICD-10-CM | POA: Diagnosis present

## 2021-03-21 DIAGNOSIS — D72824 Basophilia: Secondary | ICD-10-CM | POA: Diagnosis not present

## 2021-03-21 DIAGNOSIS — I308 Other forms of acute pericarditis: Principal | ICD-10-CM | POA: Diagnosis present

## 2021-03-21 DIAGNOSIS — I959 Hypotension, unspecified: Secondary | ICD-10-CM | POA: Diagnosis present

## 2021-03-21 DIAGNOSIS — Z4682 Encounter for fitting and adjustment of non-vascular catheter: Secondary | ICD-10-CM

## 2021-03-21 DIAGNOSIS — D75839 Thrombocytosis, unspecified: Secondary | ICD-10-CM | POA: Diagnosis present

## 2021-03-21 DIAGNOSIS — I248 Other forms of acute ischemic heart disease: Secondary | ICD-10-CM | POA: Diagnosis present

## 2021-03-21 DIAGNOSIS — Z515 Encounter for palliative care: Secondary | ICD-10-CM | POA: Diagnosis not present

## 2021-03-21 DIAGNOSIS — R0789 Other chest pain: Secondary | ICD-10-CM | POA: Diagnosis not present

## 2021-03-21 DIAGNOSIS — Z803 Family history of malignant neoplasm of breast: Secondary | ICD-10-CM

## 2021-03-21 DIAGNOSIS — I700 Atherosclerosis of aorta: Secondary | ICD-10-CM | POA: Diagnosis present

## 2021-03-21 DIAGNOSIS — Z20822 Contact with and (suspected) exposure to covid-19: Secondary | ICD-10-CM | POA: Diagnosis present

## 2021-03-21 DIAGNOSIS — Z853 Personal history of malignant neoplasm of breast: Secondary | ICD-10-CM

## 2021-03-21 DIAGNOSIS — E785 Hyperlipidemia, unspecified: Secondary | ICD-10-CM | POA: Diagnosis not present

## 2021-03-21 DIAGNOSIS — J101 Influenza due to other identified influenza virus with other respiratory manifestations: Secondary | ICD-10-CM | POA: Diagnosis present

## 2021-03-21 DIAGNOSIS — K5730 Diverticulosis of large intestine without perforation or abscess without bleeding: Secondary | ICD-10-CM | POA: Diagnosis present

## 2021-03-21 DIAGNOSIS — R296 Repeated falls: Secondary | ICD-10-CM | POA: Diagnosis present

## 2021-03-21 DIAGNOSIS — K5900 Constipation, unspecified: Secondary | ICD-10-CM | POA: Diagnosis not present

## 2021-03-21 DIAGNOSIS — R54 Age-related physical debility: Secondary | ICD-10-CM | POA: Diagnosis present

## 2021-03-21 DIAGNOSIS — Z681 Body mass index (BMI) 19 or less, adult: Secondary | ICD-10-CM

## 2021-03-21 DIAGNOSIS — B3323 Viral pericarditis: Secondary | ICD-10-CM | POA: Diagnosis not present

## 2021-03-21 DIAGNOSIS — Z87891 Personal history of nicotine dependence: Secondary | ICD-10-CM

## 2021-03-21 DIAGNOSIS — R339 Retention of urine, unspecified: Secondary | ICD-10-CM | POA: Diagnosis not present

## 2021-03-21 DIAGNOSIS — E46 Unspecified protein-calorie malnutrition: Secondary | ICD-10-CM | POA: Insufficient documentation

## 2021-03-21 DIAGNOSIS — Z9010 Acquired absence of unspecified breast and nipple: Secondary | ICD-10-CM

## 2021-03-21 DIAGNOSIS — R451 Restlessness and agitation: Secondary | ICD-10-CM | POA: Diagnosis not present

## 2021-03-21 DIAGNOSIS — I3139 Other pericardial effusion (noninflammatory): Secondary | ICD-10-CM | POA: Diagnosis present

## 2021-03-21 DIAGNOSIS — Z789 Other specified health status: Secondary | ICD-10-CM | POA: Diagnosis not present

## 2021-03-21 DIAGNOSIS — Z801 Family history of malignant neoplasm of trachea, bronchus and lung: Secondary | ICD-10-CM

## 2021-03-21 LAB — COMPREHENSIVE METABOLIC PANEL
ALT: 15 U/L (ref 0–44)
AST: 27 U/L (ref 15–41)
Albumin: 2.6 g/dL — ABNORMAL LOW (ref 3.5–5.0)
Alkaline Phosphatase: 65 U/L (ref 38–126)
Anion gap: 14 (ref 5–15)
BUN: 27 mg/dL — ABNORMAL HIGH (ref 8–23)
CO2: 21 mmol/L — ABNORMAL LOW (ref 22–32)
Calcium: 9.1 mg/dL (ref 8.9–10.3)
Chloride: 104 mmol/L (ref 98–111)
Creatinine, Ser: 0.99 mg/dL (ref 0.44–1.00)
GFR, Estimated: 57 mL/min — ABNORMAL LOW (ref >60)
Glucose, Bld: 129 mg/dL — ABNORMAL HIGH (ref 70–99)
Potassium: 3.8 mmol/L (ref 3.5–5.1)
Sodium: 139 mmol/L (ref 135–145)
Total Bilirubin: 1.1 mg/dL (ref 0.3–1.2)
Total Protein: 7.5 g/dL (ref 6.5–8.1)

## 2021-03-21 LAB — CBC WITH DIFFERENTIAL/PLATELET
Abs Immature Granulocytes: 0.21 10*3/uL — ABNORMAL HIGH (ref 0.00–0.07)
Basophils Absolute: 0 10*3/uL (ref 0.0–0.1)
Basophils Relative: 0 %
Eosinophils Absolute: 0 10*3/uL (ref 0.0–0.5)
Eosinophils Relative: 0 %
HCT: 32.4 % — ABNORMAL LOW (ref 36.0–46.0)
Hemoglobin: 10.7 g/dL — ABNORMAL LOW (ref 12.0–15.0)
Immature Granulocytes: 1 %
Lymphocytes Relative: 6 %
Lymphs Abs: 1.2 10*3/uL (ref 0.7–4.0)
MCH: 26.4 pg (ref 26.0–34.0)
MCHC: 33 g/dL (ref 30.0–36.0)
MCV: 80 fL (ref 80.0–100.0)
Monocytes Absolute: 1 10*3/uL (ref 0.1–1.0)
Monocytes Relative: 5 %
Neutro Abs: 18.1 10*3/uL — ABNORMAL HIGH (ref 1.7–7.7)
Neutrophils Relative %: 88 %
Platelets: 505 10*3/uL — ABNORMAL HIGH (ref 150–400)
RBC: 4.05 MIL/uL (ref 3.87–5.11)
RDW: 14.7 % (ref 11.5–15.5)
WBC: 20.5 10*3/uL — ABNORMAL HIGH (ref 4.0–10.5)
nRBC: 0 % (ref 0.0–0.2)

## 2021-03-21 LAB — TROPONIN I (HIGH SENSITIVITY)
Troponin I (High Sensitivity): 9 ng/L (ref <18)
Troponin I (High Sensitivity): 7 ng/L (ref <18)
Troponin I (High Sensitivity): 9 ng/L (ref <18)

## 2021-03-21 LAB — POC OCCULT BLOOD, ED: Fecal Occult Bld: NEGATIVE

## 2021-03-21 LAB — LIPASE, BLOOD: Lipase: 38 U/L (ref 11–51)

## 2021-03-21 MED ORDER — IOHEXOL 350 MG/ML SOLN
80.0000 mL | Freq: Once | INTRAVENOUS | Status: AC | PRN
  Administered 2021-03-21: 80 mL via INTRAVENOUS

## 2021-03-21 MED ORDER — COLCHICINE 0.6 MG PO TABS
0.6000 mg | ORAL_TABLET | Freq: Every day | ORAL | Status: DC
  Administered 2021-03-22 – 2021-04-06 (×15): 0.6 mg via ORAL
  Filled 2021-03-21 (×16): qty 1

## 2021-03-21 MED ORDER — SODIUM CHLORIDE 0.9 % IV BOLUS
500.0000 mL | Freq: Once | INTRAVENOUS | Status: AC
  Administered 2021-03-21: 500 mL via INTRAVENOUS

## 2021-03-21 MED ORDER — FENTANYL CITRATE PF 50 MCG/ML IJ SOSY
50.0000 ug | PREFILLED_SYRINGE | INTRAMUSCULAR | Status: AC | PRN
  Administered 2021-03-21 – 2021-03-22 (×3): 50 ug via INTRAVENOUS
  Filled 2021-03-21 (×3): qty 1

## 2021-03-21 MED ORDER — IBUPROFEN 400 MG PO TABS
400.0000 mg | ORAL_TABLET | Freq: Once | ORAL | Status: DC | PRN
  Filled 2021-03-21: qty 1

## 2021-03-21 MED ORDER — SODIUM CHLORIDE 0.9 % IV BOLUS
250.0000 mL | Freq: Once | INTRAVENOUS | Status: AC
  Administered 2021-03-21: 250 mL via INTRAVENOUS

## 2021-03-21 NOTE — ED Notes (Signed)
The patient's family member stated the patient was not able to swallow. Triage staff has been notified of said event.
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
## 2021-03-21 NOTE — ED Provider Notes (Signed)
Emergency Medicine Provider Triage Evaluation Note  Nicole Rush , a 79 y.o. female  was evaluated in triage.  Pt complains of chest pain that started when she was laying in bed yesterday. She states that it is worse with exertion, nothing relieves her pain. She states that the pain radiates to her back and legs and is sharp in nature. Pain is substernal and pleuritic in nature with associated shortness of breath. No swelling in legs or abdomen. No fevers, chills, nausea, vomiting  Review of Systems  Positive:  Negative: See above  Physical Exam  BP 97/60 (BP Location: Left Arm)   Pulse 88   Temp 98.2 F (36.8 C) (Oral)   Resp 16  Gen:   Awake, no distress   Resp:  Normal effort  MSK:   Moves extremities without difficulty  Other:    Medical Decision Making  Medically screening exam initiated at 9:35 AM.  Appropriate orders placed.  Nicole Rush was informed that the remainder of the evaluation will be completed by another provider, this initial triage assessment does not replace that evaluation, and the importance of remaining in the ED until their evaluation is complete.     Nicole Rush 03/21/21 [place], [place], DO 03/22/21 1021

## 2021-03-21 NOTE — ED Provider Notes (Signed)
[facility] EMERGENCY DEPARTMENT Provider Note   CSN: [id number] Arrival date & time: 03/21/21  7939     History Chief Complaint  Patient presents with   Chest Pain    Nicole Rush is a 80 y.o. female.  HPI Patient presents for evaluation of chest pain.  The pain started yesterday.  It radiates to her back and legs.  Patient was additionally evaluated at triage, orders were given and she has been waiting at triage for evaluation.  She was placed in examination room at around 1745.  EKG done at 1750 which I have reviewed.  Shows ST elevation, inferiorly.  Patient is complaining of pain in the chest, abdomen and back which been present for 1 and half weeks and persistent.  She is here with her daughter who helps give history.  Patient has previously been treated with nitroglycerin but apparently does not see a cardiologist.  Patient has been having frequent bowel movements for several weeks but now is having trouble having any bowel movements.  She is also had decreased urinary output.  Her daughter feels like the patient is losing weight.  Past Medical History:  Diagnosis Date   Asthma    Cancer ([place]) 03/2019   left breast DCIS   Hypertension    Stroke [facility])    per pt no defecits    Patient Active Problem List   Diagnosis Date Noted   Pericarditis 03/21/2021   Malignant neoplasm of upper-outer quadrant of left breast in female, estrogen receptor positive ([place]) 03/22/2019   Chest pain 08/11/2012   GERD (gastroesophageal reflux disease) 08/11/2012   Generalized anxiety disorder 08/11/2012    Past Surgical History:  Procedure Laterality Date   BREAST LUMPECTOMY WITH AXILLARY LYMPH NODE BIOPSY Left 04/06/2019   Procedure: LEFT BREAST LUMPECTOMY WITH LEFT SENTINEL LYMPH NODE BIOPSY;  Surgeon: [name], MD;  Location: [place];  Service: General;  Laterality: Left;   BREAST SURGERY     RE-EXCISION OF BREAST LUMPECTOMY Left 05/04/2019    Procedure: RE-EXCISION OF LEFT BREAST LUMPECTOMY;  Surgeon: [name], MD;  Location: [place];  Service: General;  Laterality: Left;     OB History   No obstetric history on file.     Family History  Problem Relation Age of Onset   Breast cancer Mother    Lung cancer Father     Social History   Tobacco Use   Smoking status: Former   Smokeless tobacco: Never  Scientific laboratory technician Use: Never used  Substance Use Topics   Alcohol use: No   Drug use: No    Home Medications Prior to Admission medications   Medication Sig Start Date End Date Taking? Authorizing Provider  anastrozole (ARIMIDEX) 1 MG tablet Take 1 tablet (1 mg total) by mouth daily. 11/06/20   Nicholas Lose, MD  loratadine (CLARITIN) 10 MG tablet Take 10 mg by mouth daily.    [provider]  losartan-hydrochlorothiazide (HYZAAR) 100-25 MG per tablet Take 1 tablet by mouth daily.    [provider]  oseltamivir (TAMIFLU) 75 MG capsule Take 1 capsule (75 mg total) by mouth every 12 (twelve) hours. 03/17/21   Antonietta Breach, PA-C  rosuvastatin (CRESTOR) 20 MG tablet Take 20 mg by mouth daily. 03/03/19   [provider]    Allergies    Patient has no known allergies.  Review of Systems   Review of Systems  All other systems reviewed and are  negative.  Physical Exam Updated Vital Signs BP 93/70   Pulse 92   Temp 99.8 F (37.7 C) (Oral)   Resp (!) 26   SpO2 96%   Physical Exam Vitals and nursing note reviewed.  Constitutional:      General: She is not in acute distress.    Appearance: She is well-developed. She is not ill-appearing, toxic-appearing or diaphoretic.  HENT:     Head: Normocephalic and atraumatic.     Right Ear: External ear normal.     Left Ear: External ear normal.  Eyes:     Conjunctiva/sclera: Conjunctivae normal.     Pupils: Pupils are equal, round, and reactive to light.  Neck:     Trachea: Phonation normal.  Cardiovascular:     Rate  and Rhythm: Normal rate and regular rhythm.     Heart sounds: Normal heart sounds.  Pulmonary:     Effort: Pulmonary effort is normal.     Breath sounds: Normal breath sounds.  Abdominal:     General: There is no distension.     Palpations: Abdomen is soft.     Tenderness: There is no abdominal tenderness.  Musculoskeletal:        General: Normal range of motion.     Cervical back: Normal range of motion and neck supple.  Skin:    General: Skin is warm and dry.     Comments: Skin lesion, left posterior pelvic region, consistent with age-related skin lesion.  Neurological:     Mental Status: She is alert and oriented to person, place, and time.     Cranial Nerves: No cranial nerve deficit.     Sensory: No sensory deficit.     Motor: No abnormal muscle tone.     Coordination: Coordination normal.  Psychiatric:        Mood and Affect: Mood normal.        Behavior: Behavior normal.        Thought Content: Thought content normal.        Judgment: Judgment normal.    ED Results / Procedures / Treatments   Labs (all labs ordered are listed, but only abnormal results are displayed) Labs Reviewed  CBC WITH DIFFERENTIAL/PLATELET - Abnormal; Notable for the following components:      Result Value   WBC 20.5 (*)    Hemoglobin 10.7 (*)    HCT 32.4 (*)    Platelets 505 (*)    Neutro Abs 18.1 (*)    Abs Immature Granulocytes 0.21 (*)    All other components within normal limits  COMPREHENSIVE METABOLIC PANEL - Abnormal; Notable for the following components:   CO2 21 (*)    Glucose, Bld 129 (*)    BUN 27 (*)    Albumin 2.6 (*)    GFR, Estimated 57 (*)    All other components within normal limits  LIPASE, BLOOD  URINALYSIS, ROUTINE W REFLEX MICROSCOPIC  POC OCCULT BLOOD, ED  TROPONIN I (HIGH SENSITIVITY)  TROPONIN I (HIGH SENSITIVITY)  TROPONIN I (HIGH SENSITIVITY)    EKG EKG Interpretation  Date/Time:  Thursday March 21 2021 17:50:00 EST Ventricular Rate:  95 PR  Interval:  140 QRS Duration: 76 QT Interval:  345 QTC Calculation: 434 R Axis:   82 Text Interpretation: Sinus rhythm diffuse ST elevation New since last tracing Reconfirmed by Daleen Bo [phone]) on 03/21/2021 10:33:23 PM  Radiology DG Chest 2 View  Result Date: 03/21/2021 CLINICAL DATA:  Chest pain beginning last night EXAM: CHEST -  2 VIEW COMPARISON:  08/10/2012 FINDINGS: Mild cardiomegaly. Tortuous aorta. The patient has taken a poor inspiration. Allowing for that, the lungs are clear. The vascularity is normal. No effusions. Surgical clips in the left axilla. IMPRESSION: Poor inspiration. Allowing for that, no active disease is suspected. Electronically Signed   By: Nelson Chimes M.D.   On: 03/21/2021 09:59   CT CHEST ABDOMEN PELVIS W CONTRAST  Result Date: 03/21/2021 CLINICAL DATA:  Chest pain and shortness of breath. Suspect infection. EXAM: CT CHEST, ABDOMEN, AND PELVIS WITH CONTRAST TECHNIQUE: Multidetector CT imaging of the chest, abdomen and pelvis was performed following the standard protocol during bolus administration of intravenous contrast. CONTRAST:  45mL OMNIPAQUE IOHEXOL 350 MG/ML SOLN COMPARISON:  None. FINDINGS: CT CHEST FINDINGS Cardiovascular: Heart is mildly enlarged. Aorta is ectatic without focal dilatation. There is a small pericardial effusion with some mild pericardial enhancement seen best on image 3/39. The main pulmonary artery is enlarged compatible with pulmonary artery hypertension. Mediastinum/Nodes: There is a 1 cm hypodense right thyroid nodule and a 5 mm hypodense left thyroid nodule. There are nonenlarged mediastinal lymph nodes diffusely. Esophagus is within normal limits. Lungs/Pleura: There are mild atelectatic changes in the lower lungs bilaterally. There is no focal lung infiltrate, pleural effusion or pneumothorax. There is a calcified granuloma in the left lung. Musculoskeletal: No chest wall mass or suspicious bone lesions identified. CT ABDOMEN PELVIS  FINDINGS Hepatobiliary: No focal liver abnormality is seen. No gallstones, gallbladder wall thickening, or biliary dilatation. Pancreas: Unremarkable. No pancreatic ductal dilatation or surrounding inflammatory changes. Spleen: Normal in size without focal abnormality. Adrenals/Urinary Tract: There is a rounded hypodensity in the left kidney which is too small to characterize, most likely a cyst. The kidneys, adrenal glands, and bladder are otherwise within normal limits. Stomach/Bowel: Stomach is within normal limits. Appendix is not seen. No evidence of bowel wall thickening, distention, or inflammatory changes. There is sigmoid colon diverticulosis without evidence for acute diverticulitis. Vascular/Lymphatic: Aortic atherosclerosis. No enlarged abdominal or pelvic lymph nodes. Reproductive: Uterus and bilateral adnexa are unremarkable. Other: No abdominal wall hernia or abnormality. No abdominopelvic ascites. Musculoskeletal: Multilevel degenerative changes affect the spine IMPRESSION: 1. Small pericardial effusion with enhancement of the pericardium worrisome for pericarditis. 2. Mild cardiomegaly. Enlargement of the main pulmonary artery compatible with pulmonary artery hypertension. 3. No acute localizing process in the abdomen or pelvis. 4. Sigmoid colon diverticulosis. 5. 1 cm incidental thyroid nodule. No follow-up imaging is recommended. Reference: J Am Coll Radiol. 2015 Feb;12(2): 143-50 6.  Aortic Atherosclerosis (ICD10-I70.0). Electronically Signed   By: Ronney Asters M.D.   On: 03/21/2021 21:54    Procedures .Critical Care Performed by: Daleen Bo, MD Authorized by: Daleen Bo, MD   Critical care provider statement:    Critical care time (minutes):  50   Critical care start time:  03/21/2021 6:30 PM   Critical care end time:  03/21/2021 11:15 PM   Critical care time was exclusive of:  Separately billable procedures and treating other patients   Critical care was necessary to treat or  prevent imminent or life-threatening deterioration of the following conditions:  Cardiac failure   Critical care was time spent personally by me on the following activities:  Blood draw for specimens, development of treatment plan with patient or surrogate, discussions with consultants, evaluation of patient's response to treatment, examination of patient, ordering and performing treatments and interventions, ordering and review of laboratory studies, ordering and review of radiographic studies, pulse oximetry, re-evaluation of patient's  condition and review of old charts   Medications Ordered in ED Medications  ibuprofen (ADVIL) tablet 400 mg (has no administration in time range)  fentaNYL (SUBLIMAZE) injection 50 mcg (50 mcg Intravenous Given 03/21/21 1913)  colchicine tablet 0.6 mg (has no administration in time range)  sodium chloride 0.9 % bolus 500 mL (has no administration in time range)  iohexol (OMNIPAQUE) 350 MG/ML injection 80 mL (80 mLs Intravenous Contrast Given 03/21/21 2131)  sodium chloride 0.9 % bolus 250 mL (0 mLs Intravenous Stopped 03/21/21 2313)    ED Course  I have reviewed the triage vital signs and the nursing notes.  Pertinent labs & imaging results that were available during my care of the patient were reviewed by me and considered in my medical decision making (see chart for details).  Clinical Course as of 03/21/21 2316  Thu Mar 21, 2021  1478 Daughter showed me a lesion on the patient's left posterior pelvic area, which appears to be a 1 x 2 and half centimeter dark-colored slightly raised age spot.  This does not have characteristic for melanoma.  I instructed the patient's daughter to have her talk to her PCP about it and consider dermatology referral if needed. [EW]  1905 I discussed the twelve-lead, and the patient's results of troponin testing with Dr. Mali Hilty, cardiology.  He states that he feels there is diffuse ST elevation and wonders if the patient has  pericarditis.  He does not recommend additional troponin testing, but does recommend CT of the chest, to evaluate for pericardial effusion.  If there is no pericardial effusion, he does not need to see the patient. [EW]  2248 CT CHEST ABDOMEN PELVIS W CONTRAST [EW]    Clinical Course User Index [EW] Daleen Bo, MD   MDM Rules/Calculators/A&P                            Patient Vitals for the past 24 hrs:  BP Temp Temp src Pulse Resp SpO2  03/21/21 2300 93/70 -- -- 92 (!) 26 96 %  03/21/21 2214 (!) 88/60 -- -- 89 (!) 22 96 %  03/21/21 2135 -- -- -- 88 20 96 %  03/21/21 2100 (!) 102/56 -- -- 87 (!) 24 96 %  03/21/21 1945 94/72 -- -- 93 (!) 25 96 %  03/21/21 1942 -- -- -- 90 20 95 %  03/21/21 1751 -- -- -- 95 19 96 %  03/21/21 1745 103/70 -- -- 95 (!) 25 95 %  03/21/21 1429 106/67 99.8 F (37.7 C) Oral 97 16 98 %  03/21/21 1327 95/69 99.8 F (37.7 C) Oral 92 18 97 %  03/21/21 1033 (!) 89/61 -- -- 90 -- --  03/21/21 1031 (!) 81/52 -- -- 92 (!) 22 97 %  03/21/21 0923 97/60 98.2 F (36.8 C) Oral 88 16 --    11:16 PM Reevaluation with update and discussion. After initial assessment and treatment, an updated evaluation reveals she states her pain is improved.  Findings discussed with patient and daughter, all questions and. Daleen Bo   Medical Decision Making:  This patient is presenting for evaluation of ongoing chest, abdomen and back pain, which does require a range of treatment options, and is a complaint that involves a high risk of morbidity and mortality. The differential diagnoses include ACS, pneumonia, intrathoracic disorders, abdominal disorders. I decided to review old records, and in summary elderly patient with a history of GERD, anxiety  and breast cancer.  I obtained additional historical information from daughter at bedside.  Clinical Laboratory Tests Ordered, included CBC, Metabolic panel, and troponin, mild . Review indicates normal except white count high,  hemoglobin low, platelets high. Radiologic Tests Ordered, included chest x-ray, CT chest, CT abdomen.  I independently Visualized: Radiographic images, which show consistent with pericarditis, no intra-abdominal abnormality  Cardiac Monitor Tracing which shows normal sinus rhythm     Critical Interventions-clinical evaluation, laboratory testing, radiography, IV fluids, discussion with cardiology regarding EKG, discussion with cardiology regarding pericarditis, observation and reassess  After These Interventions, the Patient was reevaluated and was found with persistent soft blood pressure and pericarditis on CT imaging.  She requires hospitalization for stabilization.  Doubt ACS, or pending vascular collapse.  CRITICAL CARE-yes Performed by: Daleen Bo  Nursing Notes Reviewed/ Care Coordinated Applicable Imaging Reviewed Interpretation of Laboratory Data incorporated into ED treatment  11:15 PM-hospitalist agrees to admit patient    Final Clinical Impression(s) / ED Diagnoses Final diagnoses:  Acute pericarditis, unspecified type  Hypotension, unspecified hypotension type    Rx / DC Orders ED Discharge Orders     None        Daleen Bo, MD 03/21/21 2317

## 2021-03-21 NOTE — ED Notes (Signed)
The patient states that her pain has moved from her chest to her back and is currently asking for something for pain.

## 2021-03-22 ENCOUNTER — Other Ambulatory Visit: Payer: Self-pay

## 2021-03-22 ENCOUNTER — Encounter (HOSPITAL_COMMUNITY): Payer: Self-pay | Admitting: Family Medicine

## 2021-03-22 DIAGNOSIS — W19XXXA Unspecified fall, initial encounter: Secondary | ICD-10-CM | POA: Diagnosis present

## 2021-03-22 DIAGNOSIS — I10 Essential (primary) hypertension: Secondary | ICD-10-CM | POA: Diagnosis present

## 2021-03-22 DIAGNOSIS — E440 Moderate protein-calorie malnutrition: Secondary | ICD-10-CM

## 2021-03-22 DIAGNOSIS — E785 Hyperlipidemia, unspecified: Secondary | ICD-10-CM | POA: Diagnosis present

## 2021-03-22 DIAGNOSIS — D72829 Elevated white blood cell count, unspecified: Secondary | ICD-10-CM | POA: Diagnosis present

## 2021-03-22 DIAGNOSIS — I301 Infective pericarditis: Secondary | ICD-10-CM

## 2021-03-22 LAB — URINALYSIS, ROUTINE W REFLEX MICROSCOPIC
Glucose, UA: NEGATIVE mg/dL
Ketones, ur: 15 mg/dL — AB
Leukocytes,Ua: NEGATIVE
Nitrite: NEGATIVE
Protein, ur: 30 mg/dL — AB
Specific Gravity, Urine: 1.015 (ref 1.005–1.030)
pH: 5.5 (ref 5.0–8.0)

## 2021-03-22 LAB — URINALYSIS, MICROSCOPIC (REFLEX)

## 2021-03-22 LAB — CBC WITH DIFFERENTIAL/PLATELET
Abs Immature Granulocytes: 0.16 10*3/uL — ABNORMAL HIGH (ref 0.00–0.07)
Basophils Absolute: 0.1 10*3/uL (ref 0.0–0.1)
Basophils Relative: 0 %
Eosinophils Absolute: 0 10*3/uL (ref 0.0–0.5)
Eosinophils Relative: 0 %
HCT: 28.7 % — ABNORMAL LOW (ref 36.0–46.0)
Hemoglobin: 9.6 g/dL — ABNORMAL LOW (ref 12.0–15.0)
Immature Granulocytes: 1 %
Lymphocytes Relative: 5 %
Lymphs Abs: 1.1 10*3/uL (ref 0.7–4.0)
MCH: 26.3 pg (ref 26.0–34.0)
MCHC: 33.4 g/dL (ref 30.0–36.0)
MCV: 78.6 fL — ABNORMAL LOW (ref 80.0–100.0)
Monocytes Absolute: 1.3 10*3/uL — ABNORMAL HIGH (ref 0.1–1.0)
Monocytes Relative: 6 %
Neutro Abs: 19.8 10*3/uL — ABNORMAL HIGH (ref 1.7–7.7)
Neutrophils Relative %: 88 %
Platelets: 411 10*3/uL — ABNORMAL HIGH (ref 150–400)
RBC: 3.65 MIL/uL — ABNORMAL LOW (ref 3.87–5.11)
RDW: 14.6 % (ref 11.5–15.5)
WBC: 22.4 10*3/uL — ABNORMAL HIGH (ref 4.0–10.5)
nRBC: 0 % (ref 0.0–0.2)

## 2021-03-22 LAB — COMPREHENSIVE METABOLIC PANEL
ALT: 13 U/L (ref 0–44)
AST: 21 U/L (ref 15–41)
Albumin: 2.2 g/dL — ABNORMAL LOW (ref 3.5–5.0)
Alkaline Phosphatase: 55 U/L (ref 38–126)
Anion gap: 10 (ref 5–15)
BUN: 40 mg/dL — ABNORMAL HIGH (ref 8–23)
CO2: 21 mmol/L — ABNORMAL LOW (ref 22–32)
Calcium: 8.5 mg/dL — ABNORMAL LOW (ref 8.9–10.3)
Chloride: 107 mmol/L (ref 98–111)
Creatinine, Ser: 1 mg/dL (ref 0.44–1.00)
GFR, Estimated: 56 mL/min — ABNORMAL LOW (ref >60)
Glucose, Bld: 108 mg/dL — ABNORMAL HIGH (ref 70–99)
Potassium: 4.2 mmol/L (ref 3.5–5.1)
Sodium: 138 mmol/L (ref 135–145)
Total Bilirubin: 1.4 mg/dL — ABNORMAL HIGH (ref 0.3–1.2)
Total Protein: 6.7 g/dL (ref 6.5–8.1)

## 2021-03-22 LAB — RESP PANEL BY RT-PCR (FLU A&B, COVID) ARPGX2
Influenza A by PCR: POSITIVE — AB
Influenza B by PCR: NEGATIVE
SARS Coronavirus 2 by RT PCR: NEGATIVE

## 2021-03-22 LAB — GLUCOSE, CAPILLARY: Glucose-Capillary: 109 mg/dL — ABNORMAL HIGH (ref 70–99)

## 2021-03-22 LAB — C-REACTIVE PROTEIN: CRP: 36.3 mg/dL — ABNORMAL HIGH (ref <1.0)

## 2021-03-22 LAB — SEDIMENTATION RATE: Sed Rate: 120 mm/hr — ABNORMAL HIGH (ref 0–22)

## 2021-03-22 LAB — MAGNESIUM: Magnesium: 2.5 mg/dL — ABNORMAL HIGH (ref 1.7–2.4)

## 2021-03-22 MED ORDER — MORPHINE SULFATE (PF) 2 MG/ML IV SOLN
2.0000 mg | INTRAVENOUS | Status: DC | PRN
  Administered 2021-03-22: 2 mg via INTRAVENOUS
  Filled 2021-03-22: qty 1

## 2021-03-22 MED ORDER — LORATADINE 10 MG PO TABS
10.0000 mg | ORAL_TABLET | Freq: Every day | ORAL | Status: DC
  Administered 2021-03-22 – 2021-04-06 (×16): 10 mg via ORAL
  Filled 2021-03-22 (×16): qty 1

## 2021-03-22 MED ORDER — ACETAMINOPHEN 325 MG PO TABS
650.0000 mg | ORAL_TABLET | Freq: Four times a day (QID) | ORAL | Status: DC | PRN
  Administered 2021-03-22 – 2021-04-01 (×7): 650 mg via ORAL
  Filled 2021-03-22 (×8): qty 2

## 2021-03-22 MED ORDER — LOSARTAN POTASSIUM 50 MG PO TABS: 100.0000 mg | ORAL_TABLET | Freq: Every day | ORAL | Status: DC

## 2021-03-22 MED ORDER — ONDANSETRON HCL 4 MG/2ML IJ SOLN
4.0000 mg | Freq: Four times a day (QID) | INTRAMUSCULAR | Status: DC | PRN
  Administered 2021-03-28: 4 mg via INTRAVENOUS
  Filled 2021-03-22: qty 2

## 2021-03-22 MED ORDER — HEPARIN SODIUM (PORCINE) 5000 UNIT/ML IJ SOLN
5000.0000 [IU] | Freq: Three times a day (TID) | INTRAMUSCULAR | Status: DC
  Administered 2021-03-22 – 2021-04-01 (×29): 5000 [IU] via SUBCUTANEOUS
  Filled 2021-03-22 (×29): qty 1

## 2021-03-22 MED ORDER — ACETAMINOPHEN 650 MG RE SUPP: 650.0000 mg | Freq: Four times a day (QID) | RECTAL | Status: DC | PRN

## 2021-03-22 MED ORDER — SODIUM CHLORIDE 0.9 % IV BOLUS: 500.0000 mL | Freq: Once | INTRAVENOUS | Status: DC

## 2021-03-22 MED ORDER — HYDROCHLOROTHIAZIDE 25 MG PO TABS: 25.0000 mg | ORAL_TABLET | Freq: Every day | ORAL | Status: DC

## 2021-03-22 MED ORDER — ONDANSETRON HCL 4 MG PO TABS
4.0000 mg | ORAL_TABLET | Freq: Four times a day (QID) | ORAL | Status: DC | PRN
  Administered 2021-03-22: 4 mg via ORAL
  Filled 2021-03-22: qty 1

## 2021-03-22 MED ORDER — MORPHINE SULFATE (PF) 2 MG/ML IV SOLN
2.0000 mg | INTRAVENOUS | Status: DC | PRN
  Administered 2021-03-23 – 2021-03-29 (×3): 2 mg via INTRAVENOUS
  Filled 2021-03-22 (×3): qty 1

## 2021-03-22 MED ORDER — OSELTAMIVIR PHOSPHATE 30 MG PO CAPS
30.0000 mg | ORAL_CAPSULE | Freq: Two times a day (BID) | ORAL | Status: AC
  Administered 2021-03-22 – 2021-03-23 (×4): 30 mg via ORAL
  Filled 2021-03-22 (×4): qty 1

## 2021-03-22 MED ORDER — SODIUM CHLORIDE 0.9 % IV SOLN: INTRAVENOUS | Status: DC

## 2021-03-22 MED ORDER — OXYCODONE HCL 5 MG PO TABS
5.0000 mg | ORAL_TABLET | ORAL | Status: DC | PRN
  Administered 2021-03-22 – 2021-04-05 (×17): 5 mg via ORAL
  Filled 2021-03-22 (×19): qty 1

## 2021-03-22 MED ORDER — ROSUVASTATIN CALCIUM 20 MG PO TABS
20.0000 mg | ORAL_TABLET | Freq: Every day | ORAL | Status: DC
  Administered 2021-03-22 – 2021-03-27 (×6): 20 mg via ORAL
  Filled 2021-03-22 (×6): qty 1

## 2021-03-22 MED ORDER — LOSARTAN POTASSIUM-HCTZ 100-25 MG PO TABS: 1.0000 | ORAL_TABLET | Freq: Every day | ORAL | Status: DC

## 2021-03-22 MED ORDER — ENSURE ENLIVE PO LIQD
237.0000 mL | Freq: Two times a day (BID) | ORAL | Status: DC
  Administered 2021-03-23 – 2021-04-06 (×25): 237 mL via ORAL

## 2021-03-22 NOTE — Progress Notes (Signed)
Care started prior to midnight in the emergency room and patient was admitted early this morning after midnight by Dr. Carlynn Purl and I am in current agreement with her assessment and plan. Additional changes to plan of care have been made.  The patient is an 80 year old African-American female with a past medical history significant for but not limited to asthma, cancer, hypertension, history of CVA as well as recent flu presented to the ED with a chief complaint of 1 to 2 weeks of chest pain.  She reports a pressure in her chest that has been intermittent and she is not sure what makes it worse but she does state that eating ice makes it better and laying flat makes it worse but she does not know if leaning forward makes it better.  She has had associated dyspnea for the last 1 to 2 weeks and has had nausea and vomiting 2 times per day.  She reports there appears to be undigested food and also reports that she has lost weight and has had palpitations.  She is not sure if her symptoms are exertional or not.  She is diagnosed with the flu on the 27th of November have been prescribed Tamiflu.  She reports a decreased appetite and no other complaints or concerns at this time but she does state that she has back and belly pain associated to the EDP.  In the ED she was noted to have a temperature 98.2 respiratory rate of 16 and 25.  I will stop blood pressure and saturations 96%.  Troponins were normal and downtrending.  She is given nitroglycerin with improvement in her symptoms but does not see a cardiologist. Chest x-ray showed poor inspiration but no active disease.  EKG showed heart rate of 95 with diffuse ST changes.  She was admitted for pericarditis associated the flu and cardiology was consulted and recommended starting the patient on colchicine.  We will continue to monitor on telemetry.  Currently she is being admitted and treated for the following but not limited to:  Pericarditis -Associated  likely from Influenza -Continue Colchicine 0.6 mg po Daily and with Pain control with Acetaminophen 650 mg po q6hprn Mild Pain, Morphine 2 mg IV q2prn, and Oxycodone 5 mg po q4hprn; the EDP discussed with Dr. Mali Hilty and he feels like there is diffuse ST elevation and wonders if the patient has pericarditis.  He did not recommend any additional troponin testing but did recommend CT of the chest to evaluate for pericardial effusion no pericardial effusion cardiology does not need to see the patient necessarily -CT Scan showed "Small pericardial effusion with enhancement of the pericardium worrisome for pericarditis.  Mild cardiomegaly. Enlargement of the main pulmonary artery compatible with pulmonary artery hypertension.  No acute localizing process in the abdomen or pelvis. Sigmoid colon diverticulosis. 1 cm incidental thyroid nodule. No follow-up imaging is recommended. Aortic Atherosclerosis." -WBC went from 20.5 -> 22.4 -CRP was 36.3 and and ESR was 108 -Started Gentle IVF hydration with NS at 75 mL/hr; Given a 500 mL  Bolus and 250 mL bolus of NS -Continue to monitor on telemetry -We will discuss with cardiology about further recommendations  Influenza A -Continue Oseltamivir 30 mg po BID  -Initially diagnosed on November 27  Moderate protein calorie malnutrition -Encourage nutrient dense food choices -Has had poor po Intake so will consult Nutrition and add Ensure Enlive 237 mL po BID  Hypertension -With hypotension in the ED -Continue Holding losartan and hydrochlorothiazide -Fluids as above -  Continue to Monitor BP per Protocol  -Last BP was   Thyroid Nodule -Noted to have 1 cm Nodule Incidental Thyroid Nodule -Outpatient U/S recommended   Hyperlipidemia -Continue Rosuvastatin 20 mg po Daily   Metabolic Acidosis -Mild. CO2 is 21, AG is 10, and Chloride level 107 -Started IVF with NS at 75 mL/hr -Continue to Monitor and Trend -Repeat CMP in the AM   Thrombocytosis -Mild  and Likely reactive from above -Platelet Count went from 505 -> 411 -Continue to Monitor and Trend -Repeat CBC in the AM   Falls -PT eval and treat -Family would like to consider placement  Poor Po Intake -Consult Nutritionist for further evaluation   Hyperbilirubinemia  -T Bili went from 1.1 -> 1.4 -Continue to Monitor and Trend -Repeat CMP in the AM  Microcytic Anemia -Patient's Hgb/Hct went from 10.7/32.4 -> 9.6/28.7 -Check Anemia Panel in the AM -Continue to Monitor for S/Sx of Bleeding; No overt bleeding noted -Repeat CBC in theAM    We will continue to monitor the patient's clinical response to intervention and repeat blood work and imaging in the a.m. and follow-up on her response to colchicine.

## 2021-03-22 NOTE — ED Notes (Signed)
Admitting provider at bedside.

## 2021-03-22 NOTE — H&P (Signed)
TRH H&P    Patient Demographics:    Nicole Rush, is a 80 y.o. female  MRN: [id number]  DOB - 1940/05/10  Admit Date - 03/21/2021  Referring MD/NP/PA: Eulis Foster  Outpatient Primary MD for the patient is Lin Landsman, MD  Patient coming from: Home  Chief complaint- chest pain   HPI:    Nicole Rush  is a 80 y.o. female, with history of asthma, cancer, hypertension, stroke, recent flu presents ED with a chief complaint of 1-2 weeks of chest pain.  Patient reports pressure in her chest that is been intermittent.  She is not sure what makes it worse.  She reports eating ice makes it better.  Laying flat makes it worse, but she is not sure if leaning forward makes it better.  She had associated dyspnea for 1-2 weeks.  She has had nausea and vomiting approximately 2 times per day.  It appears as undigested food.  She reports that she is lost weight.  She has palpitations associated.  She is not sure if the symptoms are exertional or not.  She was diagnosed on the 27th with fluid and has been prescribed Tamiflu.  Patient reports a decreased appetite.  Patient has no other complaints at this time.  She did report to the ED provider that she had back and belly pain associated, but did not report that to me.  Patient does not smoke, does not drink alcohol, does not use illicit drugs.  She is vaccinated for COVID.  Patient is full code.  In the ED Temp 98.2, heart rate 87-97, respiratory rate 16-25, blood pressure little low 88/60, satting at 96% Leukocytosis 20.5, hemoglobin 10.7, thrombocytosis 505 Chemistry panel reveals a decreased bicarb at 21 Albumin 2.6 Troponin normal and downtrending at 9, 7 Chest x-ray shows poor inspiration but no active disease suspected EKG shows a heart rate of 95, sinus rhythm, QTC 4 and 34 with diffuse ST changes     Review of systems:    In addition to the HPI above,  Admits subjective  fever No Headache, No changes with Vision or hearing, No problems swallowing food or Liquids, Admits to chest pain or shortness of breath No Abdominal pain,  bowel movements are regular, No Blood in stool or Urine, No dysuria, No new skin rashes or bruises, No new joints pains-aches,  No new weakness, tingling, numbness in any extremity, No recent weight gain or loss, No polyuria, polydypsia or polyphagia, No significant Mental Stressors.  All other systems reviewed and are negative.    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   Hypertension    Stroke [facility])    per pt no defecits      Past Surgical History:  Procedure Laterality Date   BREAST LUMPECTOMY WITH AXILLARY LYMPH NODE BIOPSY Left 04/06/2019   Procedure: LEFT BREAST LUMPECTOMY WITH LEFT SENTINEL LYMPH NODE BIOPSY;  Surgeon: [name], MD;  Location: [facility];  Service: General;  Laterality: Left;   BREAST SURGERY     RE-EXCISION OF BREAST LUMPECTOMY Left 05/04/2019   Procedure: RE-EXCISION OF LEFT BREAST LUMPECTOMY;  Surgeon: [name], MD;  Location: [facility];  Service: General;  Laterality: Left;      Social History:      Social History   Tobacco Use   Smoking status: Former   Smokeless tobacco: Never  Substance Use Topics   Alcohol use: No       Family History :     Family History  Problem Relation Age of Onset   Breast cancer Mother    Lung cancer Father       Home Medications:   Prior to Admission medications   Medication Sig Start Date End Date Taking? Authorizing Provider  anastrozole (ARIMIDEX) 1 MG tablet Take 1 tablet (1 mg total) by mouth daily. 11/06/20   Nicholas Lose, MD  loratadine (CLARITIN) 10 MG tablet Take 10 mg by mouth daily.    [provider]  losartan-hydrochlorothiazide (HYZAAR) 100-25 MG per tablet Take 1 tablet by mouth daily.     [provider]  oseltamivir (TAMIFLU) 75 MG capsule Take 1 capsule (75 mg total) by mouth every 12 (twelve) hours. 03/17/21   Antonietta Breach, PA-C  rosuvastatin (CRESTOR) 20 MG tablet Take 20 mg by mouth daily. 03/03/19   [provider]     Allergies:    No Known Allergies   Physical Exam:   Vitals  Blood pressure 95/69, pulse 91, temperature 99.8 F (37.7 C), temperature source Oral, resp. rate (!) 28, SpO2 95 %.   1.  General: Patient lying supine in bed,  no acute distress   2. Psychiatric: Alert and oriented x 3, mood and behavior normal for situation, pleasant and cooperative with exam   3. Neurologic: Speech and language are normal, face is symmetric, moves all 4 extremities voluntarily, at baseline without acute deficits on limited exam   4. HEENMT:  Head is atraumatic, normocephalic, pupils reactive to light, neck is cachectic, trachea is midline, mucous membranes are moist   5. Respiratory : Lungs are clear to auscultation bilaterally without wheezing, rhonchi, rales, no cyanosis, no increase in work of breathing or accessory muscle use   6. Cardiovascular : Heart rate normal, rhythm is regular, no murmurs, rubs or gallops, no peripheral edema, peripheral pulses palpated   7. Gastrointestinal:  Abdomen is soft, nondistended, nontender to palpation bowel sounds active, no masses or organomegaly palpated   8. Skin:  Skin is warm, dry and intact without rashes, acute lesions, or ulcers on limited exam   9.Musculoskeletal:  No acute deformities or trauma, no asymmetry in tone, no peripheral edema, peripheral pulses palpated, no tenderness to palpation in the extremities     Data Review:    CBC Recent Labs  Lab 03/21/21 0935  WBC 20.5*  HGB 10.7*  HCT 32.4*  PLT 505*  MCV 80.0  MCH 26.4  MCHC 33.0  RDW 14.7  LYMPHSABS 1.2  MONOABS 1.0  EOSABS 0.0  BASOSABS 0.0    ------------------------------------------------------------------------------------------------------------------  Results for orders placed or performed during the hospital encounter of 03/21/21 (from the past 48 hour(s))  CBC with Differential     Status: Abnormal   Collection Time: 03/21/21  9:35 AM  Result Value Ref Range   WBC 20.5 (H) 4.0 - 10.5 K/uL   RBC 4.05 3.87 - 5.11 MIL/uL   Hemoglobin 10.7 (L) 12.0 - 15.0  g/dL   HCT 32.4 (L) 36.0 - 46.0 %   MCV 80.0 80.0 - 100.0 fL   MCH 26.4 26.0 - 34.0 pg   MCHC 33.0 30.0 - 36.0 g/dL   RDW 14.7 11.5 - 15.5 %   Platelets 505 (H) 150 - 400 K/uL   nRBC 0.0 0.0 - 0.2 %   Neutrophils Relative % 88 %   Neutro Abs 18.1 (H) 1.7 - 7.7 K/uL   Lymphocytes Relative 6 %   Lymphs Abs 1.2 0.7 - 4.0 K/uL   Monocytes Relative 5 %   Monocytes Absolute 1.0 0.1 - 1.0 K/uL   Eosinophils Relative 0 %   Eosinophils Absolute 0.0 0.0 - 0.5 K/uL   Basophils Relative 0 %   Basophils Absolute 0.0 0.0 - 0.1 K/uL   Immature Granulocytes 1 %   Abs Immature Granulocytes 0.21 (H) 0.00 - 0.07 K/uL    Comment: Performed at [facility] [address], [place], [place] [address]  Comprehensive metabolic panel     Status: Abnormal   Collection Time: 03/21/21  9:35 AM  Result Value Ref Range   Sodium 139 135 - 145 mmol/L   Potassium 3.8 3.5 - 5.1 mmol/L   Chloride 104 98 - 111 mmol/L   CO2 21 (L) 22 - 32 mmol/L   Glucose, Bld 129 (H) 70 - 99 mg/dL    Comment: Glucose reference range applies only to samples taken after fasting for at least 8 hours.   BUN 27 (H) 8 - 23 mg/dL   Creatinine, Ser 0.99 0.44 - 1.00 mg/dL   Calcium 9.1 8.9 - 10.3 mg/dL   Total Protein 7.5 6.5 - 8.1 g/dL   Albumin 2.6 (L) 3.5 - 5.0 g/dL   AST 27 15 - 41 U/L   ALT 15 0 - 44 U/L   Alkaline Phosphatase 65 38 - 126 U/L   Total Bilirubin 1.1 0.3 - 1.2 mg/dL   GFR, Estimated 57 (L) >60 mL/min    Comment: (NOTE) Calculated using the CKD-EPI Creatinine Equation (2021)    Anion  gap 14 5 - 15    Comment: Performed at [facility] [address] [place], [place] [address]  Troponin I (High Sensitivity)     Status: None   Collection Time: 03/21/21  9:35 AM  Result Value Ref Range   Troponin I (High Sensitivity) 9 <18 ng/L    Comment: (NOTE) Elevated high sensitivity troponin I (hsTnI) values and significant  changes across serial measurements may suggest ACS but many other  chronic and acute conditions are known to elevate hsTnI results.  Refer to the "Links" section for chest pain algorithms and additional  guidance. Performed at [facility], [place] [address], [place], [place] [address]   Lipase, blood     Status: None   Collection Time: 03/21/21  9:35 AM  Result Value Ref Range   Lipase 38 11 - 51 U/L    Comment: Performed at [facility] [address] [place],  [address]  Urinalysis, Routine w reflex microscopic Urine, In & Out Cath     Status: Abnormal   Collection Time: 03/21/21  9:35 AM  Result Value Ref Range   Color, Urine YELLOW YELLOW   APPearance CLEAR CLEAR   Specific Gravity, Urine 1.015 1.005 - 1.030   pH 5.5 5.0 - 8.0   Glucose, UA NEGATIVE NEGATIVE mg/dL   Hgb urine dipstick SMALL (A) NEGATIVE   Bilirubin Urine  SMALL (A) NEGATIVE   Ketones, ur 15 (A) NEGATIVE mg/dL   Protein, ur 30 (A) NEGATIVE mg/dL   Nitrite NEGATIVE NEGATIVE   Leukocytes,Ua NEGATIVE NEGATIVE    Comment: Performed at [facility] [address], [place], [place] [address]  Urinalysis, Microscopic (reflex)     Status: Abnormal   Collection Time: 03/21/21  9:35 AM  Result Value Ref Range   RBC / HPF 11-20 0 - 5 RBC/hpf   WBC, UA 0-5 0 - 5 WBC/hpf   Bacteria, UA RARE (A) NONE SEEN   Squamous Epithelial / LPF 0-5 0 - 5   Mucus PRESENT     Comment: Performed at [facility], [place] [address] [place], [place] [address]  Troponin I (High Sensitivity)     Status: None   Collection Time: 03/21/21 11:35 AM  Result Value Ref Range    Troponin I (High Sensitivity) 7 <18 ng/L    Comment: (NOTE) Elevated high sensitivity troponin I (hsTnI) values and significant  changes across serial measurements may suggest ACS but many other  chronic and acute conditions are known to elevate hsTnI results.  Refer to the "Links" section for chest pain algorithms and additional  guidance. Performed at [facility], [place] [address], [place], [place] [address]   Troponin I (High Sensitivity)     Status: None   Collection Time: 03/21/21  8:14 PM  Result Value Ref Range   Troponin I (High Sensitivity) 9 <18 ng/L    Comment: (NOTE) Elevated high sensitivity troponin I (hsTnI) values and significant  changes across serial measurements may suggest ACS but many other  chronic and acute conditions are known to elevate hsTnI results.  Refer to the "Links" section for chest pain algorithms and additional  guidance. Performed at [facility], [place] [address], [place], [place] [address]   POC occult blood, ED Provider will collect     Status: None   Collection Time: 03/21/21  9:44 PM  Result Value Ref Range   Fecal Occult Bld NEGATIVE NEGATIVE    Chemistries  Recent Labs  Lab 03/21/21 0935  NA 139  K 3.8  CL 104  CO2 21*  GLUCOSE 129*  BUN 27*  CREATININE 0.99  CALCIUM 9.1  AST 27  ALT 15  ALKPHOS 65  BILITOT 1.1   ------------------------------------------------------------------------------------------------------------------  ------------------------------------------------------------------------------------------------------------------ GFR: CrCl cannot be calculated (Unknown ideal weight.). Liver Function Tests: Recent Labs  Lab 03/21/21 0935  AST 27  ALT 15  ALKPHOS 65  BILITOT 1.1  PROT 7.5  ALBUMIN 2.6*   Recent Labs  Lab 03/21/21 0935  LIPASE 38   No results for input(s): AMMONIA in the last 168 hours. Coagulation Profile: No results for input(s): INR, PROTIME in the last 168  hours. Cardiac Enzymes: No results for input(s): CKTOTAL, CKMB, CKMBINDEX, TROPONINI in the last 168 hours. BNP (last 3 results) No results for input(s): PROBNP in the last 8760 hours. HbA1C: No results for input(s): HGBA1C in the last 72 hours. CBG: No results for input(s): GLUCAP in the last 168 hours. Lipid Profile: No results for input(s): CHOL, HDL, LDLCALC, TRIG, CHOLHDL, LDLDIRECT in the last 72 hours. Thyroid Function Tests: No results for input(s): TSH, T4TOTAL, FREET4, T3FREE, THYROIDAB in the last 72 hours. Anemia Panel: No results for input(s): VITAMINB12, FOLATE, FERRITIN, TIBC, IRON, RETICCTPCT in the last 72 hours.  --------------------------------------------------------------------------------------------------------------- Urine analysis:    Component Value Date/Time   COLORURINE YELLOW 03/21/2021 0935  APPEARANCEUR CLEAR 03/21/2021 0935   LABSPEC 1.015 03/21/2021 0935   PHURINE 5.5 03/21/2021 0935   GLUCOSEU NEGATIVE 03/21/2021 0935   HGBUR SMALL (A) 03/21/2021 0935   BILIRUBINUR SMALL (A) 03/21/2021 0935   KETONESUR 15 (A) 03/21/2021 0935   PROTEINUR 30 (A) 03/21/2021 0935   NITRITE NEGATIVE 03/21/2021 0935   LEUKOCYTESUR NEGATIVE 03/21/2021 0935      Imaging Results:    DG Chest 2 View  Result Date: 03/21/2021 CLINICAL DATA:  Chest pain beginning last night EXAM: CHEST - 2 VIEW COMPARISON:  08/10/2012 FINDINGS: Mild cardiomegaly. Tortuous aorta. The patient has taken a poor inspiration. Allowing for that, the lungs are clear. The vascularity is normal. No effusions. Surgical clips in the left axilla. IMPRESSION: Poor inspiration. Allowing for that, no active disease is suspected. Electronically Signed   By: Nelson Chimes M.D.   On: 03/21/2021 09:59   CT CHEST ABDOMEN PELVIS W CONTRAST  Result Date: 03/21/2021 CLINICAL DATA:  Chest pain and shortness of breath. Suspect infection. EXAM: CT CHEST, ABDOMEN, AND PELVIS WITH CONTRAST TECHNIQUE: Multidetector  CT imaging of the chest, abdomen and pelvis was performed following the standard protocol during bolus administration of intravenous contrast. CONTRAST:  78mL OMNIPAQUE IOHEXOL 350 MG/ML SOLN COMPARISON:  None. FINDINGS: CT CHEST FINDINGS Cardiovascular: Heart is mildly enlarged. Aorta is ectatic without focal dilatation. There is a small pericardial effusion with some mild pericardial enhancement seen best on image 3/39. The main pulmonary artery is enlarged compatible with pulmonary artery hypertension. Mediastinum/Nodes: There is a 1 cm hypodense right thyroid nodule and a 5 mm hypodense left thyroid nodule. There are nonenlarged mediastinal lymph nodes diffusely. Esophagus is within normal limits. Lungs/Pleura: There are mild atelectatic changes in the lower lungs bilaterally. There is no focal lung infiltrate, pleural effusion or pneumothorax. There is a calcified granuloma in the left lung. Musculoskeletal: No chest wall mass or suspicious bone lesions identified. CT ABDOMEN PELVIS FINDINGS Hepatobiliary: No focal liver abnormality is seen. No gallstones, gallbladder wall thickening, or biliary dilatation. Pancreas: Unremarkable. No pancreatic ductal dilatation or surrounding inflammatory changes. Spleen: Normal in size without focal abnormality. Adrenals/Urinary Tract: There is a rounded hypodensity in the left kidney which is too small to characterize, most likely a cyst. The kidneys, adrenal glands, and bladder are otherwise within normal limits. Stomach/Bowel: Stomach is within normal limits. Appendix is not seen. No evidence of bowel wall thickening, distention, or inflammatory changes. There is sigmoid colon diverticulosis without evidence for acute diverticulitis. Vascular/Lymphatic: Aortic atherosclerosis. No enlarged abdominal or pelvic lymph nodes. Reproductive: Uterus and bilateral adnexa are unremarkable. Other: No abdominal wall hernia or abnormality. No abdominopelvic ascites. Musculoskeletal:  Multilevel degenerative changes affect the spine IMPRESSION: 1. Small pericardial effusion with enhancement of the pericardium worrisome for pericarditis. 2. Mild cardiomegaly. Enlargement of the main pulmonary artery compatible with pulmonary artery hypertension. 3. No acute localizing process in the abdomen or pelvis. 4. Sigmoid colon diverticulosis. 5. 1 cm incidental thyroid nodule. No follow-up imaging is recommended. Reference: J Am Coll Radiol. 2015 Feb;12(2): 143-50 6.  Aortic Atherosclerosis (ICD10-I70.0). Electronically Signed   By: Ronney Asters M.D.   On: 03/21/2021 21:54       Assessment & Plan:    Principal Problem:   Pericarditis Active Problems:   Protein-calorie malnutrition, moderate (HCC)   Hyperlipidemia   Essential hypertension   Leukocytosis   Falls   Pericarditis Associated flu Continue colchicine Continue to monitor on telemetry Flu Continue Tamiflu Initially diagnosed on November 27 Moderate  protein calorie malnutrition Encourage nutrient dense food choices Hypertension With hypotension in the ED Holding losartan and hydrochlorothiazide Hyperlipidemia Continue statin Falls PT eval and treat Family would like to consider placement    DVT Prophylaxis-   Heparin - SCDs   AM Labs Ordered, also please review Full Orders  Family Communication: Admission, patients condition and plan of care including tests being ordered have been discussed with the patient and daughter who indicate understanding and agree with the plan and Code Status.  Code Status: Full  Admission status: Inpatient :The appropriate admission status for this patient is INPATIENT. Inpatient status is judged to be reasonable and necessary in order to provide the required intensity of service to ensure the patient's safety. The patient's presenting symptoms, physical exam findings, and initial radiographic and laboratory data in the context of their chronic comorbidities is felt to place  them at high risk for further clinical deterioration. Furthermore, it is not anticipated that the patient will be medically stable for discharge from the hospital within 2 midnights of admission. The following factors support the admission status of inpatient.     The patient's presenting symptoms include chest pain. The worrisome physical exam findings include cachexia. The initial radiographic and laboratory data are worrisome because of leukocytosis, diffuse ST changes on EKG. The chronic co-morbidities include hypertension, hyperlipidemia.       * I certify that at the point of admission it is my clinical judgment that the patient will require inpatient hospital care spanning beyond 2 midnights from the point of admission due to high intensity of service, high risk for further deterioration and high frequency of surveillance required.*  Disposition: Anticipated Discharge date 48 hours Discharge to SNF  Time spent in minutes : [place] DO

## 2021-03-22 NOTE — ED Notes (Signed)
Pt reports continued bladder pain and inability to void. Performed in and out cath with about 350cc orange colored urine.

## 2021-03-22 NOTE — Progress Notes (Signed)
Physical Therapy Treatment Patient Details Name: Nicole Rush MRN: [id number] DOB: [birth year] Today's Date: 03/22/2021   History of Present Illness The pt is an 80 yo female presenting 12/1 with c/o chest pain. Upon work up, suspect pericarditis which is likley related to recent dx of the flu. PMH includes: asthma, breast cancer, HTN, and stroke with no residual deficits.    PT Comments    Pt in bed upon arrival of PT, agreeable to evaluation at this time. Prior to admission the pt was completely independent, mobilizing without need for AD, and walking "miles" at a time outside for exercise. The pt now presents with limitations in functional mobility, activity tolerance, power, strength, and dynamic stability due to above dx, and will continue to benefit from skilled PT to address these deficits. The pt was able to complete bed mobility and sit-stand transfers with minA, but demos poor functional strength and power in BLE to complete these transfers without assistance. Additionally, she was limited to short distance ambulation in the room with minA to steady and manage RW. Will continue to benefit from skilled PT acutely and following d/c to maximize functional recovery and allow pt to achieve her goal of returning to walking independently for exercise.     Recommendations for follow up therapy are one component of a multi-disciplinary discharge planning process, led by the attending physician.  Recommendations may be updated based on patient status, additional functional criteria and insurance authorization.  Follow Up Recommendations  Home health PT     Assistance Recommended at Discharge Frequent or constant Supervision/Assistance  Equipment Recommendations  Rolling walker (2 wheels);BSC/3in1    Recommendations for Other Services OT consult     Precautions / Restrictions Precautions Precautions: Fall Precaution Comments: watch O2 Restrictions Weight Bearing Restrictions: No      Mobility  Bed Mobility Overal bed mobility: Needs Assistance Bed Mobility: Supine to Sit;Sit to Supine     Supine to sit: Min assist Sit to supine: Min assist   General bed mobility comments: minA to complete movement and significantly increased time    Transfers Overall transfer level: Needs assistance Equipment used: Rolling walker (2 wheels) Transfers: Sit to/from Stand Sit to Stand: Min assist;Min guard           General transfer comment: minA initially with VC for hand placement, pt very slow to rise but able to complete with BLE braced on bed for support    Ambulation/Gait Ambulation/Gait assistance: Min assist Gait Distance (Feet): 5 Feet (x4) Assistive device: Rolling walker (2 wheels) Gait Pattern/deviations: Step-to pattern;Trunk flexed Gait velocity: decreased Gait velocity interpretation: <1.31 ft/sec, indicative of household ambulator Pre-gait activities: Hydrologist Details: pt with heavy relaince on BUE support, able to take lateral steps in either direction along EOB but does brace BLE on bed. then took x8 steps forwards and back x2 with RW. HR to 116bpm SpO2 >91%.      Balance Overall balance assessment: Needs assistance Sitting-balance support: Bilateral upper extremity supported;Feet supported Sitting balance-Leahy Scale: Poor Sitting balance - Comments: posterior LOB and BLE coming up off floor with perturbations Postural control: Posterior lean Standing balance support: Bilateral upper extremity supported;During functional activity Standing balance-Leahy Scale: Poor Standing balance comment: dependent on BUE support, posterior lean                            Cognition Arousal/Alertness: Awake/alert Behavior During Therapy: Flat affect Overall Cognitive Status: Within Functional  Limits for tasks assessed                                 General Comments: pt answering all questions with increased  time to complete/process. able to answer all questions appropriately when given time        Exercises Other Exercises Other Exercises: sit-stand from EOB 2 x 5    General Comments General comments (skin integrity, edema, etc.): SpO2 to 88% on RA, remained >92% on 2L      Pertinent Vitals/Pain Pain Assessment: No/denies pain    Home Living Family/patient expects to be discharged to:: Private residence Living Arrangements: Children;Other relatives Available Help at Discharge: Family;Available 24 hours/day Type of Home: House Home Access: Stairs to enter Entrance Stairs-Rails: Right Entrance Stairs-Number of Steps: 5   Home Layout: One level Home Equipment: None Additional Comments: pt living with one family member (granddaughter who was present at session), will be moving to different family member's home soon where she has a flight of stairs to the bedrooms    Prior Function            PT Goals (current goals can now be found in the care plan section) Acute Rehab PT Goals Patient Stated Goal: return to walking for exercise PT Goal Formulation: With patient/family Time For Goal Achievement: 04/05/21 Potential to Achieve Goals: Good    Frequency    Min 3X/week       AM-PAC PT "6 Clicks" Mobility   Outcome Measure  Help needed turning from your back to your side while in a flat bed without using bedrails?: None Help needed moving from lying on your back to sitting on the side of a flat bed without using bedrails?: A Little Help needed moving to and from a bed to a chair (including a wheelchair)?: A Little Help needed standing up from a chair using your arms (e.g., wheelchair or bedside chair)?: A Little Help needed to walk in hospital room?: Total Help needed climbing 3-5 steps with a railing? : Total 6 Click Score: 15    End of Session Equipment Utilized During Treatment: Gait belt;Oxygen Activity Tolerance: Patient tolerated treatment well;Patient limited by  fatigue Patient left: in bed;with call bell/phone within reach;with bed alarm set;with family/visitor present;with nursing/sitter in room Nurse Communication: Mobility status PT Visit Diagnosis: Other abnormalities of gait and mobility (R26.89);Muscle weakness (generalized) (M62.81)     Time: 0388-8280 PT Time Calculation (min) (ACUTE ONLY): 34 min  Charges:  $Gait Training: 8-22 mins                     Nicole Rush, PT, DPT   Acute Rehabilitation Department Pager #: [phone]    Nicole Rush 03/22/2021, 6:07 PM

## 2021-03-23 ENCOUNTER — Telehealth: Payer: Self-pay | Admitting: Acute Care

## 2021-03-23 ENCOUNTER — Inpatient Hospital Stay (HOSPITAL_COMMUNITY): Payer: Medicare HMO

## 2021-03-23 DIAGNOSIS — R079 Chest pain, unspecified: Secondary | ICD-10-CM

## 2021-03-23 DIAGNOSIS — J9601 Acute respiratory failure with hypoxia: Secondary | ICD-10-CM

## 2021-03-23 DIAGNOSIS — I309 Acute pericarditis, unspecified: Secondary | ICD-10-CM

## 2021-03-23 LAB — CBC WITH DIFFERENTIAL/PLATELET
Abs Immature Granulocytes: 0.11 10*3/uL — ABNORMAL HIGH (ref 0.00–0.07)
Basophils Absolute: 0 10*3/uL (ref 0.0–0.1)
Basophils Relative: 0 %
Eosinophils Absolute: 0 10*3/uL (ref 0.0–0.5)
Eosinophils Relative: 0 %
HCT: 29.6 % — ABNORMAL LOW (ref 36.0–46.0)
Hemoglobin: 10.2 g/dL — ABNORMAL LOW (ref 12.0–15.0)
Immature Granulocytes: 1 %
Lymphocytes Relative: 5 %
Lymphs Abs: 0.7 10*3/uL (ref 0.7–4.0)
MCH: 26.4 pg (ref 26.0–34.0)
MCHC: 34.5 g/dL (ref 30.0–36.0)
MCV: 76.7 fL — ABNORMAL LOW (ref 80.0–100.0)
Monocytes Absolute: 0.7 10*3/uL (ref 0.1–1.0)
Monocytes Relative: 5 %
Neutro Abs: 12.5 10*3/uL — ABNORMAL HIGH (ref 1.7–7.7)
Neutrophils Relative %: 89 %
Platelets: 440 10*3/uL — ABNORMAL HIGH (ref 150–400)
RBC: 3.86 MIL/uL — ABNORMAL LOW (ref 3.87–5.11)
RDW: 14.7 % (ref 11.5–15.5)
WBC: 14 10*3/uL — ABNORMAL HIGH (ref 4.0–10.5)
nRBC: 0.1 % (ref 0.0–0.2)

## 2021-03-23 LAB — COMPREHENSIVE METABOLIC PANEL
ALT: 15 U/L (ref 0–44)
AST: 28 U/L (ref 15–41)
Albumin: 2.1 g/dL — ABNORMAL LOW (ref 3.5–5.0)
Alkaline Phosphatase: 63 U/L (ref 38–126)
Anion gap: 10 (ref 5–15)
BUN: 50 mg/dL — ABNORMAL HIGH (ref 8–23)
CO2: 24 mmol/L (ref 22–32)
Calcium: 8.5 mg/dL — ABNORMAL LOW (ref 8.9–10.3)
Chloride: 106 mmol/L (ref 98–111)
Creatinine, Ser: 1.15 mg/dL — ABNORMAL HIGH (ref 0.44–1.00)
GFR, Estimated: 48 mL/min — ABNORMAL LOW (ref >60)
Glucose, Bld: 146 mg/dL — ABNORMAL HIGH (ref 70–99)
Potassium: 4 mmol/L (ref 3.5–5.1)
Sodium: 140 mmol/L (ref 135–145)
Total Bilirubin: 1 mg/dL (ref 0.3–1.2)
Total Protein: 6.8 g/dL (ref 6.5–8.1)

## 2021-03-23 LAB — MAGNESIUM: Magnesium: 2.8 mg/dL — ABNORMAL HIGH (ref 1.7–2.4)

## 2021-03-23 LAB — PHOSPHORUS: Phosphorus: 3.8 mg/dL (ref 2.5–4.6)

## 2021-03-23 MED ORDER — HYDROXYZINE HCL 25 MG PO TABS
25.0000 mg | ORAL_TABLET | Freq: Three times a day (TID) | ORAL | Status: DC | PRN
  Administered 2021-03-26 – 2021-03-29 (×3): 25 mg via ORAL
  Filled 2021-03-23 (×3): qty 1

## 2021-03-23 MED ORDER — PREDNISONE 20 MG PO TABS
20.0000 mg | ORAL_TABLET | Freq: Every day | ORAL | Status: DC
  Administered 2021-03-23 – 2021-03-30 (×8): 20 mg via ORAL
  Filled 2021-03-23 (×8): qty 1

## 2021-03-23 MED ORDER — PANTOPRAZOLE SODIUM 40 MG PO TBEC
40.0000 mg | DELAYED_RELEASE_TABLET | Freq: Every day | ORAL | Status: DC
  Administered 2021-03-23 – 2021-04-06 (×15): 40 mg via ORAL
  Filled 2021-03-23 (×14): qty 1

## 2021-03-23 MED ORDER — IPRATROPIUM BROMIDE 0.02 % IN SOLN
0.5000 mg | Freq: Four times a day (QID) | RESPIRATORY_TRACT | Status: DC
  Administered 2021-03-23 – 2021-03-24 (×5): 0.5 mg via RESPIRATORY_TRACT
  Filled 2021-03-23 (×4): qty 2.5

## 2021-03-23 MED ORDER — LORAZEPAM 2 MG/ML IJ SOLN
0.5000 mg | Freq: Four times a day (QID) | INTRAMUSCULAR | Status: DC | PRN
  Administered 2021-03-23: 0.5 mg via INTRAVENOUS
  Filled 2021-03-23: qty 1

## 2021-03-23 MED ORDER — GUAIFENESIN ER 600 MG PO TB12
1200.0000 mg | ORAL_TABLET | Freq: Two times a day (BID) | ORAL | Status: DC
  Administered 2021-03-23 – 2021-04-01 (×20): 1200 mg via ORAL
  Filled 2021-03-23 (×21): qty 2

## 2021-03-23 MED ORDER — IBUPROFEN 200 MG PO TABS: 600.0000 mg | ORAL_TABLET | Freq: Three times a day (TID) | ORAL | Status: DC

## 2021-03-23 MED ORDER — LEVALBUTEROL HCL 0.63 MG/3ML IN NEBU
0.6300 mg | INHALATION_SOLUTION | Freq: Four times a day (QID) | RESPIRATORY_TRACT | Status: DC
  Administered 2021-03-23 – 2021-03-24 (×5): 0.63 mg via RESPIRATORY_TRACT
  Filled 2021-03-23 (×4): qty 3

## 2021-03-23 NOTE — Progress Notes (Signed)
Pt was some agitated this morning. After Lorazepam 0.5 mg pt felt relaxed and sleepy till lunch time. Pt was getting up to bed side commode to urinate after bladder scan showed 400 cc retained, output recorded, appetite low this time but vitals wnl. Daughters are visiting  and updated, will continue to monitor the patient  Palma Holter, Therapist, sports

## 2021-03-23 NOTE — Progress Notes (Signed)
PROGRESS NOTE    Nicole Rush  WPY:[id number] DOB: Aug 30, 1940 DOA: 03/21/2021 PCP: Lin Landsman, MD   Brief Narrative:  The patient is an 80 year old African-American female with a past medical history significant for but not limited to asthma, cancer, hypertension, history of CVA as well as recent flu presented to the ED with a chief complaint of 1 to 2 weeks of chest pain.  She reports a pressure in her chest that has been intermittent and she is not sure what makes it worse but she does state that eating ice makes it better and laying flat makes it worse but she does not know if leaning forward makes it better.  She has had associated dyspnea for the last 1 to 2 weeks and has had nausea and vomiting 2 times per day.  She reports there appears to be undigested food and also reports that she has lost weight and has had palpitations.  She is not sure if her symptoms are exertional or not.  She is diagnosed with the flu on the 27th of November have been prescribed Tamiflu.  She reports a decreased appetite and no other complaints or concerns at this time but she does state that she has back and belly pain associated to the EDP.  In the ED she was noted to have a temperature 98.2 respiratory rate of 16 and 25.  I will stop blood pressure and saturations 96%.  Troponins were normal and downtrending.  She is given nitroglycerin with improvement in her symptoms but does not see a cardiologist. Chest x-ray showed poor inspiration but no active disease.  EKG showed heart rate of 95 with diffuse ST changes.  She was admitted for pericarditis associated the flu and cardiology was consulted and recommended starting the patient on colchicine  Assessment & Plan:   Principal Problem:   Pericarditis Active Problems:   Protein-calorie malnutrition, moderate (HCC)   Hyperlipidemia   Essential hypertension   Leukocytosis   Falls  Pericarditis with small Pericardial Effu -Associated likely from  Influenza -Continue Colchicine 0.6 mg po Daily and with Pain control with Acetaminophen 650 mg po q6hprn Mild Pain, Morphine 2 mg IV q2prn, and Oxycodone 5 mg po q4hprn; the EDP discussed with Dr. Mali Hilty and he feels like there is diffuse ST elevation and wonders if the patient has pericarditis.  He did not recommend any additional troponin testing but did recommend CT of the chest to evaluate for pericardial effusion no pericardial effusion cardiology does not need to see the patient necessarily -CT Scan showed "Small pericardial effusion with enhancement of the pericardium worrisome for pericarditis.  Mild cardiomegaly. Enlargement of the main pulmonary artery compatible with pulmonary artery hypertension.  No acute localizing process in the abdomen or pelvis. Sigmoid colon diverticulosis. 1 cm incidental thyroid nodule. No follow-up imaging is recommended. Aortic Atherosclerosis." -WBC went from 20.5 -> 22.4 and is trended down to 14.0 -CRP was 36.3 and and ESR was 108; Repeat Inflammatory markers in the AM  -IVF Hydration now to stop  -Continue to monitor on telemetry -We will discuss with Cardiology about further recommendations and we we were going to start Ibuprofen 600 mg TID and added GI protection with po PPI Pantoprazole 40 mg daily but with her CrCl around 28 so will will start 20 mg po Prednisone Daily -We will need to monitor the patient's renal function carefully given that we are starting NSAIDs; if NSAIDs worsening renal function may consider steroids then   Influenza A -  Continue Oseltamivir 30 mg po BID  -CXR this AM showed "The cardio pericardial silhouette is enlarged. There is pulmonary vascular congestion without overt pulmonary edema. Interstitial markings are diffusely coarsened with chronic features. The visualized bony structures of the thorax show no acute abnormality. Telemetry leads overlie the chest  -Initially diagnosed on November 27  Acute Respiratory Failure with  Hypoxia -from Above -SpO2: 97 % O2 Flow Rate (L/min): 5 L/min -Flutter Valve, Incentive Spirometry, and Guaifenesin 1200 mg po BID -Started Xopenex/Atrovent q6h  -Continuous Pulse Oximetry and Maintain O2 Saturations >90% -Continue Supplemental O2 via [place] and wean O2 as tolerated -Repeat CXR in the AM and will need an Ambulatory Home O2 screen   Moderate protein calorie malnutrition -Encourage nutrient dense food choices -Has had poor po Intake so will consult Nutrition and add Ensure Enlive 237 mL po BID   Hypertension -With hypotension in the ED -Continue Holding losartan and hydrochlorothiazide -Fluids as above -Continue to Monitor BP per Protocol  -Last BP was on the softer side of 101/69  Urinary Retention -Bladder Scan showed 400 mL -Strict I's and O's -If Necessary will do I and O Cath and if continues to retain may need Foley    Thyroid Nodule -Noted to have 1 cm Nodule Incidental Thyroid Nodule -Outpatient U/S recommended    Hyperlipidemia -Continue Rosuvastatin 20 mg po Daily    Metabolic Acidosis -Mild and now improved as a CO2 is now 24, anion gap is 10, chloride level is 106 -Started IVF with NS at 75 mL/hr and will stop today -Continue to Monitor and Trend -Repeat CMP in the AM    Thrombocytosis -Mild and Likely reactive from above -Platelet Count went from 505 -> 411 and is trended up to 440 today -Continue to Monitor and Trend -Repeat CBC in the AM    Falls -PT and OT to further evaluate and treat and they are recommendign Home Health PT with [facility] Walker with 2 Wheels and BSC/3in1   Poor Po Intake -Consult Nutritionist for further evaluation    Hyperbilirubinemia  -T Bili went from 1.1 -> 1.4 and is trended down to 1.0 -Continue to Monitor and Trend -Repeat CMP in the AM   Microcytic Anemia -Patient's Hgb/Hct went from 10.7/32.4 -> 9.6/28.7 and is now 10.2/29.6 -Check Anemia Panel in the AM -Continue to Monitor for S/Sx of Bleeding; No  overt bleeding noted -Repeat CBC in theAM   DVT prophylaxis: Heparin 5,000 units sq q8h Code Status: FULL CODE  Family Communication: Discussed with Daughter [name] over the Telephone Disposition Plan: Needs further clinical Improvement prior to D/C and will likely go home with Home Health   Status is: Inpatient  Remains inpatient appropriate because: Continues to spike temperatures and was very agitated. Will need further clinical improvement prior to safe D/C Disposition   Consultants:  Discuss with Cardiology   Procedures: CTA Chest   Antimicrobials: Seen and examined at bedside and was very somnolent this morning Anti-infectives (From admission, onward)    Start     Dose/Rate Route Frequency Ordered Stop   03/22/21 1000  oseltamivir (TAMIFLU) capsule 30 mg       Note to Pharmacy: Tamiflu 30 mg BID for CrCl <  60 mL/min   30 mg Oral 2 times daily 03/22/21 0304 03/24/21 0959        Subjective: Seen and examined at bedside and was very somnolent this morning after she received Ativan.  Nursing stated that she is extremely agitated this morning and  try to climb out of bed multiple times.  Had a temperature of 102.3 this morning.  When I went to see her she was calm and resting in bed.  Remained on oxygen and had been placed on a nonrebreather but then I asked the nurse to change to nasal cannula and she was saturating well on 5 L.  Patient is wanting to rest.  No complaints of pain currently but continues little drowsy.  No other concerns or points this time.  Objective: Vitals:   03/23/21 1421 03/23/21 1517 03/23/21 1600 03/23/21 1700  BP:  107/75 93/74 101/69  Pulse:  96 95 96  Resp:  18 18 18   Temp:  98.1 F (36.7 C)    TempSrc:  Oral    SpO2: 99% 96% 96% 97%    Intake/Output Summary (Last 24 hours) at 03/23/2021 1708 Last data filed at 03/23/2021 1319 Gross per 24 hour  Intake 1442.78 ml  Output 600 ml  Net 842.78 ml   There were no vitals filed for this  visit.  Examination: Physical Exam:  Constitutional: Thin elderly African-American female currently in no acute distress appears calm and she is somnolent and drowsy resting upper her lorazepam Eyes:  Lids and conjunctivae normal, sclerae anicteric  ENMT: External Ears, Nose appear normal.  Neck: Appears normal, supple, no cervical masses, normal ROM, no appreciable thyromegaly; no appreciable JVD Respiratory: Diminished to auscultation bilaterally with coarse breath sounds, no wheezing, rales, rhonchi or crackles. Normal respiratory effort and patient is not tachypenic. No accessory muscle use.  Unlabored breathing but she is wearing supplemental oxygen via nasal cannula Cardiovascular: RRR, no murmurs / rubs / gallops. S1 and S2 auscultated.  Minimal extremity edema Abdomen: Soft, non-tender, non-distended. Bowel sounds positive.  GU: Deferred. Musculoskeletal: No clubbing / cyanosis of digits/nails. No joint deformity upper and lower extremities.  Skin: No rashes, lesions, ulcers on limited skin evaluation. No induration; Warm and dry.  Neurologic: CN 2-12 grossly intact with no focal deficits. Romberg sign and cerebellar reflexes not assessed.  Psychiatric: Normal judgment and insight. Alert and oriented x 3. Normal mood and appropriate affect.   Data Reviewed: I have personally reviewed following labs and imaging studies  CBC: Recent Labs  Lab 03/21/21 0935 03/22/21 0554 03/23/21 0100  WBC 20.5* 22.4* 14.0*  NEUTROABS 18.1* 19.8* 12.5*  HGB 10.7* 9.6* 10.2*  HCT 32.4* 28.7* 29.6*  MCV 80.0 78.6* 76.7*  PLT 505* 411* 891*   Basic Metabolic Panel: Recent Labs  Lab 03/21/21 0935 03/22/21 0554 03/23/21 0100  NA 139 138 140  K 3.8 4.2 4.0  CL 104 107 106  CO2 21* 21* 24  GLUCOSE 129* 108* 146*  BUN 27* 40* 50*  CREATININE 0.99 1.00 1.15*  CALCIUM 9.1 8.5* 8.5*  MG  --  2.5* 2.8*  PHOS  --   --  3.8   GFR: CrCl cannot be calculated (Unknown ideal weight.). Liver  Function Tests: Recent Labs  Lab 03/21/21 0935 03/22/21 0554 03/23/21 0100  AST 27 21 28   ALT 15 13 15   ALKPHOS 65 55 63  BILITOT 1.1 1.4* 1.0  PROT 7.5 6.7 6.8  ALBUMIN 2.6* 2.2* 2.1*   Recent Labs  Lab 03/21/21 0935  LIPASE 38   No results for input(s): AMMONIA in the last 168 hours. Coagulation Profile: No results for input(s): INR, PROTIME in the last 168 hours. Cardiac Enzymes: No results for input(s): CKTOTAL, CKMB, CKMBINDEX, TROPONINI in the last 168 hours. BNP (last 3 results)  No results for input(s): PROBNP in the last 8760 hours. HbA1C: No results for input(s): HGBA1C in the last 72 hours. CBG: Recent Labs  Lab 03/22/21 1557  GLUCAP 109*   Lipid Profile: No results for input(s): CHOL, HDL, LDLCALC, TRIG, CHOLHDL, LDLDIRECT in the last 72 hours. Thyroid Function Tests: No results for input(s): TSH, T4TOTAL, FREET4, T3FREE, THYROIDAB in the last 72 hours. Anemia Panel: No results for input(s): VITAMINB12, FOLATE, FERRITIN, TIBC, IRON, RETICCTPCT in the last 72 hours. Sepsis Labs: No results for input(s): PROCALCITON, LATICACIDVEN in the last 168 hours.  Recent Results (from the past 240 hour(s))  Resp Panel by RT-PCR (Flu A&B, Covid) Nasopharyngeal Swab     Status: Abnormal   Collection Time: 03/17/21  1:52 AM   Specimen: Nasopharyngeal Swab; Nasopharyngeal(NP) swabs in vial transport medium  Result Value Ref Range Status   SARS Coronavirus 2 by RT PCR NEGATIVE NEGATIVE Final    Comment: (NOTE) SARS-CoV-2 target nucleic acids are NOT DETECTED.  The SARS-CoV-2 RNA is generally detectable in upper respiratory specimens during the acute phase of infection. The lowest concentration of SARS-CoV-2 viral copies this assay can detect is 138 copies/mL. A negative result does not preclude SARS-Cov-2 infection and should not be used as the sole basis for treatment or other patient management decisions. A negative result may occur with  improper specimen  collection/handling, submission of specimen other than nasopharyngeal swab, presence of viral mutation(s) within the areas targeted by this assay, and inadequate number of viral copies(<138 copies/mL). A negative result must be combined with clinical observations, patient history, and epidemiological information. The expected result is Negative.  Fact Sheet for Patients:  EntrepreneurPulse.com.au  Fact Sheet for Healthcare Providers:  IncredibleEmployment.be  This test is no t yet approved or cleared by the [place] FDA and  has been authorized for detection and/or diagnosis of SARS-CoV-2 by FDA under an Emergency Use Authorization (EUA). This EUA will remain  in effect (meaning this test can be used) for the duration of the COVID-19 declaration under Section 564(b)(1) of the Act, 21 U.S.C.section 360bbb-3(b)(1), unless the authorization is terminated  or revoked sooner.       Influenza A by PCR POSITIVE (A) NEGATIVE Final   Influenza B by PCR NEGATIVE NEGATIVE Final    Comment: (NOTE) The Xpert Xpress SARS-CoV-2/FLU/RSV plus assay is intended as an aid in the diagnosis of influenza from Nasopharyngeal swab specimens and should not be used as a sole basis for treatment. Nasal washings and aspirates are unacceptable for Xpert Xpress SARS-CoV-2/FLU/RSV testing.  Fact Sheet for Patients: EntrepreneurPulse.com.au  Fact Sheet for Healthcare Providers: IncredibleEmployment.be  This test is not yet approved or cleared by the [place] FDA and has been authorized for detection and/or diagnosis of SARS-CoV-2 by FDA under an Emergency Use Authorization (EUA). This EUA will remain in effect (meaning this test can be used) for the duration of the COVID-19 declaration under Section 564(b)(1) of the Act, 21 U.S.C. section 360bbb-3(b)(1), unless the authorization is terminated or revoked.  Performed at  [facility], [place] [address], [place], [place] [address]   Resp Panel by RT-PCR (Flu A&B, Covid) Nasopharyngeal Swab     Status: Abnormal   Collection Time: 03/22/21  9:29 AM   Specimen: Nasopharyngeal Swab; Nasopharyngeal(NP) swabs in vial transport medium  Result Value Ref Range Status   SARS Coronavirus 2 by RT PCR NEGATIVE NEGATIVE Final    Comment: (NOTE) SARS-CoV-2 target nucleic acids are NOT DETECTED.  The SARS-CoV-2  RNA is generally detectable in upper respiratory specimens during the acute phase of infection. The lowest concentration of SARS-CoV-2 viral copies this assay can detect is 138 copies/mL. A negative result does not preclude SARS-Cov-2 infection and should not be used as the sole basis for treatment or other patient management decisions. A negative result may occur with  improper specimen collection/handling, submission of specimen other than nasopharyngeal swab, presence of viral mutation(s) within the areas targeted by this assay, and inadequate number of viral copies(<138 copies/mL). A negative result must be combined with clinical observations, patient history, and epidemiological information. The expected result is Negative.  Fact Sheet for Patients:  EntrepreneurPulse.com.au  Fact Sheet for Healthcare Providers:  IncredibleEmployment.be  This test is no t yet approved or cleared by the [place] FDA and  has been authorized for detection and/or diagnosis of SARS-CoV-2 by FDA under an Emergency Use Authorization (EUA). This EUA will remain  in effect (meaning this test can be used) for the duration of the COVID-19 declaration under Section 564(b)(1) of the Act, 21 U.S.C.section 360bbb-3(b)(1), unless the authorization is terminated  or revoked sooner.       Influenza A by PCR POSITIVE (A) NEGATIVE Final   Influenza B by PCR NEGATIVE NEGATIVE Final    Comment: (NOTE) The Xpert Xpress  SARS-CoV-2/FLU/RSV plus assay is intended as an aid in the diagnosis of influenza from Nasopharyngeal swab specimens and should not be used as a sole basis for treatment. Nasal washings and aspirates are unacceptable for Xpert Xpress SARS-CoV-2/FLU/RSV testing.  Fact Sheet for Patients: EntrepreneurPulse.com.au  Fact Sheet for Healthcare Providers: IncredibleEmployment.be  This test is not yet approved or cleared by the [place] FDA and has been authorized for detection and/or diagnosis of SARS-CoV-2 by FDA under an Emergency Use Authorization (EUA). This EUA will remain in effect (meaning this test can be used) for the duration of the COVID-19 declaration under Section 564(b)(1) of the Act, 21 U.S.C. section 360bbb-3(b)(1), unless the authorization is terminated or revoked.  Performed at [facility], [place] [address], [place], [place] [address]     RN Pressure Injury Documentation:     Estimated body mass index is 20.4 kg/m as calculated from the following:   Height as of 11/06/20: 5' (1.524 m).   Weight as of 11/06/20: 47.4 kg.  Malnutrition Type:   Malnutrition Characteristics:   Nutrition Interventions:    Radiology Studies: CT CHEST ABDOMEN PELVIS W CONTRAST  Result Date: 03/21/2021 CLINICAL DATA:  Chest pain and shortness of breath. Suspect infection. EXAM: CT CHEST, ABDOMEN, AND PELVIS WITH CONTRAST TECHNIQUE: Multidetector CT imaging of the chest, abdomen and pelvis was performed following the standard protocol during bolus administration of intravenous contrast. CONTRAST:  69m OMNIPAQUE IOHEXOL 350 MG/ML SOLN COMPARISON:  None. FINDINGS: CT CHEST FINDINGS Cardiovascular: Heart is mildly enlarged. Aorta is ectatic without focal dilatation. There is a small pericardial effusion with some mild pericardial enhancement seen best on image 3/39. The main pulmonary artery is enlarged compatible with pulmonary artery hypertension.  Mediastinum/Nodes: There is a 1 cm hypodense right thyroid nodule and a 5 mm hypodense left thyroid nodule. There are nonenlarged mediastinal lymph nodes diffusely. Esophagus is within normal limits. Lungs/Pleura: There are mild atelectatic changes in the lower lungs bilaterally. There is no focal lung infiltrate, pleural effusion or pneumothorax. There is a calcified granuloma in the left lung. Musculoskeletal: No chest wall mass or suspicious bone lesions identified. CT ABDOMEN PELVIS FINDINGS Hepatobiliary: No focal liver abnormality  is seen. No gallstones, gallbladder wall thickening, or biliary dilatation. Pancreas: Unremarkable. No pancreatic ductal dilatation or surrounding inflammatory changes. Spleen: Normal in size without focal abnormality. Adrenals/Urinary Tract: There is a rounded hypodensity in the left kidney which is too small to characterize, most likely a cyst. The kidneys, adrenal glands, and bladder are otherwise within normal limits. Stomach/Bowel: Stomach is within normal limits. Appendix is not seen. No evidence of bowel wall thickening, distention, or inflammatory changes. There is sigmoid colon diverticulosis without evidence for acute diverticulitis. Vascular/Lymphatic: Aortic atherosclerosis. No enlarged abdominal or pelvic lymph nodes. Reproductive: Uterus and bilateral adnexa are unremarkable. Other: No abdominal wall hernia or abnormality. No abdominopelvic ascites. Musculoskeletal: Multilevel degenerative changes affect the spine IMPRESSION: 1. Small pericardial effusion with enhancement of the pericardium worrisome for pericarditis. 2. Mild cardiomegaly. Enlargement of the main pulmonary artery compatible with pulmonary artery hypertension. 3. No acute localizing process in the abdomen or pelvis. 4. Sigmoid colon diverticulosis. 5. 1 cm incidental thyroid nodule. No follow-up imaging is recommended. Reference: J Am Coll Radiol. 2015 Feb;12(2): 143-50 6.  Aortic Atherosclerosis  (ICD10-I70.0). Electronically Signed   By: Ronney Asters M.D.   On: 03/21/2021 21:54   DG CHEST PORT 1 VIEW  Result Date: 03/23/2021 CLINICAL DATA:  Fever. EXAM: PORTABLE CHEST 1 VIEW COMPARISON:  03/21/2021 FINDINGS: 0519 hours. The cardio pericardial silhouette is enlarged. There is pulmonary vascular congestion without overt pulmonary edema. Interstitial markings are diffusely coarsened with chronic features. The visualized bony structures of the thorax show no acute abnormality. Telemetry leads overlie the chest. IMPRESSION: Chronic interstitial lung disease. No acute cardiopulmonary findings. Electronically Signed   By: Misty Stanley M.D.   On: 03/23/2021 06:58    Scheduled Meds:  colchicine  0.6 mg Oral Daily   feeding supplement  237 mL Oral BID BM   guaiFENesin  1,200 mg Oral BID   heparin  5,000 Units Subcutaneous Q8H   ipratropium  0.5 mg Nebulization Q6H   levalbuterol  0.63 mg Nebulization Q6H   loratadine  10 mg Oral Daily   oseltamivir  30 mg Oral BID   rosuvastatin  20 mg Oral Daily   Continuous Infusions:  sodium chloride      LOS: 2 days   Kerney Elbe, DO [facility] PAGER is on AMION  If 7PM-7AM, please contact night-coverage [email or web address]

## 2021-03-23 NOTE — Plan of Care (Signed)
  Problem: Education: Goal: Knowledge of General Education information will improve Description: Including pain rating scale, medication(s)/side effects and non-pharmacologic comfort measures 03/23/2021 0044 by Trellis Moment, RN Outcome: Progressing 03/23/2021 0042 by Trellis Moment, RN Outcome: Progressing   Problem: Health Behavior/Discharge Planning: Goal: Ability to manage health-related needs will improve 03/23/2021 0044 by Trellis Moment, RN Outcome: Progressing 03/23/2021 0042 by Trellis Moment, RN Outcome: Progressing   Problem: Clinical Measurements: Goal: Ability to maintain clinical measurements within normal limits will improve 03/23/2021 0044 by Trellis Moment, RN Outcome: Progressing 03/23/2021 0042 by Trellis Moment, RN Outcome: Progressing Goal: Will remain free from infection 03/23/2021 0044 by Trellis Moment, RN Outcome: Progressing 03/23/2021 0042 by Trellis Moment, RN Outcome: Progressing Goal: Diagnostic test results will improve 03/23/2021 0044 by Trellis Moment, RN Outcome: Progressing 03/23/2021 0042 by Trellis Moment, RN Outcome: Progressing Goal: Respiratory complications will improve 03/23/2021 0044 by Trellis Moment, RN Outcome: Progressing 03/23/2021 0042 by Trellis Moment, RN Outcome: Progressing Goal: Cardiovascular complication will be avoided 03/23/2021 0044 by Trellis Moment, RN Outcome: Progressing 03/23/2021 0042 by Trellis Moment, RN Outcome: Progressing   Problem: Coping: Goal: Level of anxiety will decrease 03/23/2021 0044 by Trellis Moment, RN Outcome: Progressing 03/23/2021 0042 by Trellis Moment, RN Outcome: Progressing   Problem: Elimination: Goal: Will not experience complications related to bowel motility 03/23/2021 0044 by Trellis Moment, RN Outcome: Progressing 03/23/2021 0042 by Trellis Moment, RN Outcome: Progressing   Problem: Pain Managment: Goal: General experience of comfort will improve 03/23/2021 0044 by Trellis Moment, RN Outcome: Progressing 03/23/2021 0042  by Trellis Moment, RN Outcome: Progressing   Problem: Safety: Goal: Ability to remain free from injury will improve 03/23/2021 0044 by Trellis Moment, RN Outcome: Progressing 03/23/2021 0042 by Trellis Moment, RN Outcome: Progressing   Problem: Skin Integrity: Goal: Risk for impaired skin integrity will decrease 03/23/2021 0044 by Trellis Moment, RN Outcome: Progressing 03/23/2021 0042 by Trellis Moment, RN Outcome: Progressing   Problem: Activity: Goal: Risk for activity intolerance will decrease 03/23/2021 0044 by Trellis Moment, RN Outcome: Not Progressing 03/23/2021 0042 by Trellis Moment, RN Outcome: Not Progressing   Problem: Nutrition: Goal: Adequate nutrition will be maintained 03/23/2021 0044 by Trellis Moment, RN Outcome: Not Progressing 03/23/2021 0042 by Trellis Moment, RN Outcome: Not Progressing   Problem: Elimination: Goal: Will not experience complications related to urinary retention Outcome: Not Progressing

## 2021-03-23 NOTE — Progress Notes (Signed)
OT Cancellation Note  Patient Details Name: Brinae Woods MRN: [id number] DOB: 1940-09-16   Cancelled Treatment:    Reason Eval/Treat Not Completed: Fatigue/lethargy limiting ability to participate  Malka So 03/23/2021, 3:32 PM Nestor Lewandowsky, OTR/L Acute Rehabilitation Services Pager: [phone] Office: [phone]

## 2021-03-23 NOTE — Consult Note (Signed)
Cardiology Consultation:   Patient ID: Nicole Rush MRN: [id number]; DOB: Jun 04, 1940  Admit date: 03/21/2021 Date of Consult: 03/23/2021  Primary Care Provider: Lin Landsman, MD [facility] Cardiologist: None  [facility] Electrophysiologist:  None   Patient Profile:   Nicole Rush is a 80 y.o. female with HTN, prior CVA, asthma, and recent flu infection who is being seen today for the evaluation of chest pain at the request of Dr. Alfredia Ferguson.   History of Present Illness:   Ms. Paschen presented to the emergency department 12/02 with intermittent chest pain that was worse with lying flat and associated with 1 to 2 weeks of dyspnea on exertion.  She also had associated nausea and emesis twice daily and some weight loss.  She was not able to discern whether her symptoms were exertional.  She was diagnosed with influenza A on 03/17/2021 and was given Tamiflu.  On initial presentation she was afebrile (T 98.2), HR 87-97, RR 16-25 with mild asx hypotension (88/60) and normal O2 sats (96% RA).  She had a leukocytosis of 20.5, and thrombocytosis of 505 with a mildly low albumin of 2.6.  Her troponins were flat (9->7).  Chest x-ray did not reveal any pneumonia.  ECG with normal sinus rhythm and Q waves in V2 but otherwise no dynamic ischemic changes compared to prior.  During my evaluation Ms. Coalson is fatigued but conversant.  She was still reporting 9/10 constant chest pain although she says this is somewhat better than prior.  Her granddaughter is at bedside and said that she is normally very active around the house and that the flu really took its toll on her this past week.  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   Hypertension    Stroke ([place])    per pt no defecits   Past Surgical History:  Procedure Laterality Date   BREAST LUMPECTOMY WITH AXILLARY LYMPH NODE BIOPSY Left 04/06/2019   Procedure: LEFT BREAST LUMPECTOMY WITH  LEFT SENTINEL LYMPH NODE BIOPSY;  Surgeon: [name], MD;  Location: [place];  Service: General;  Laterality: Left;   BREAST SURGERY     RE-EXCISION OF BREAST LUMPECTOMY Left 05/04/2019   Procedure: RE-EXCISION OF LEFT BREAST LUMPECTOMY;  Surgeon: [name], MD;  Location: [place];  Service: General;  Laterality: Left;    Home Medications:  Prior to Admission medications   Medication Sig Start Date End Date Taking? Authorizing Provider  acetaminophen (TYLENOL) 500 MG tablet Take 500 mg by mouth every 6 (six) hours as needed for mild pain, fever or headache.   Yes [provider]  anastrozole (ARIMIDEX) 1 MG tablet Take 1 tablet (1 mg total) by mouth daily. 11/06/20  Yes Nicholas Lose, MD  DM-Phenylephrine-Acetaminophen (VICKS DAYQUIL COLD & FLU PO) Take 1 Dose by mouth 3 (three) times daily as needed (cough/fever/cold symptoms).   Yes [provider]  Multiple Vitamin (MULTIVITAMIN WITH MINERALS) TABS tablet Take 1 tablet by mouth daily.   Yes [provider]  MYRBETRIQ 25 MG TB24 tablet Take 25 mg by mouth daily. 03/12/21  Yes [provider]  oseltamivir (TAMIFLU) 75 MG capsule Take 1 capsule (75 mg total) by mouth every 12 (twelve) hours. 03/17/21  Yes Antonietta Breach, PA-C   Inpatient Medications: Scheduled Meds:  colchicine  0.6 mg Oral Daily   feeding supplement  237 mL Oral BID BM   guaiFENesin  1,200 mg Oral BID   heparin  5,000 Units Subcutaneous Q8H   ipratropium  0.5 mg Nebulization Q6H   levalbuterol  0.63 mg Nebulization Q6H   loratadine  10 mg Oral Daily   oseltamivir  30 mg Oral BID   pantoprazole  40 mg Oral Daily   predniSONE  20 mg Oral Q breakfast   rosuvastatin  20 mg Oral Daily   Continuous Infusions:  sodium chloride     PRN Meds: acetaminophen **OR** acetaminophen, hydrOXYzine, morphine injection, ondansetron **OR** ondansetron (ZOFRAN) IV, oxyCODONE  Allergies:   No Known  Allergies  Social History:   Social History   Socioeconomic History   Marital status: Widowed    Spouse name: Not on file   Number of children: Not on file   Years of education: Not on file   Highest education level: Not on file  Occupational History   Not on file  Tobacco Use   Smoking status: Former   Smokeless tobacco: Never  Vaping Use   Vaping Use: Never used  Substance and Sexual Activity   Alcohol use: No   Drug use: No   Sexual activity: Never  Other Topics Concern   Not on file  Social History Narrative   Not on file   Social Determinants of Health   Financial Resource Strain: Not on file  Food Insecurity: Not on file  Transportation Needs: Not on file  Physical Activity: Not on file  Stress: Not on file  Social Connections: Not on file  Intimate Partner Violence: Not on file    Family History:    Family History  Problem Relation Age of Onset   Breast cancer Mother    Lung cancer Father     ROS:  Review of Systems: [y] = yes, [ ]  = no      General: Weight gain [ ] ; Weight loss [ ] ; Anorexia [ ] ; Fatigue [ ] ; Fever [ ] ; Chills [ ] ; Weakness [ ]    Cardiac: Chest pain/pressure [y]; Resting SOB [y]; Exertional SOB [y]; Orthopnea [ ] ; Pedal Edema [ ] ; Palpitations [ ] ; Syncope [ ] ; Presyncope [ ] ; Paroxysmal nocturnal dyspnea [ ]    Pulmonary: Cough [ ] ; Wheezing [ ] ; Hemoptysis [ ] ; Sputum [ ] ; Snoring [ ]   GI: Vomiting [ ] ; Dysphagia [ ] ; Melena [ ] ; Hematochezia [ ] ; Heartburn [ ] ; Abdominal pain [ ] ; Constipation [ ] ; Diarrhea [ ] ; BRBPR [ ]    GU: Hematuria [ ] ; Dysuria [ ] ; Nocturia [ ]  Vascular: Pain in legs with walking [ ] ; Pain in feet with lying flat [ ] ; Non-healing sores [ ] ; Stroke [ ] ; TIA [ ] ; Slurred speech [ ] ;   Neuro: Headaches [ ] ; Vertigo [ ] ; Seizures [ ] ; Paresthesias [ ] ;Blurred vision [ ] ; Diplopia [ ] ; Vision changes [ ]    Ortho/Skin: Arthritis [ ] ; Joint pain [ ] ; Muscle pain [ ] ; Joint swelling [ ] ; Back Pain [ ] ; Rash [ ]    Psych:  Depression [ ] ; Anxiety [ ]    Heme: Bleeding problems [ ] ; Clotting disorders [ ] ; Anemia [ ]    Endocrine: Diabetes [ ] ; Thyroid dysfunction [ ]    Physical Exam/Data:   Vitals:   03/23/21 1517 03/23/21 1600 03/23/21 1700 03/23/21 1800  BP: 107/75 93/74 101/69 96/62  Pulse: 96 95 96 98  Resp: 18 18 18 16   Temp: 98.1 F (36.7 C)     TempSrc: Oral     SpO2: 96%  96% 97% 97%  Weight:    43.3 kg  Height:    5\' 1"  (1.549 m)    Intake/Output Summary (Last 24 hours) at 03/23/2021 1838 Last data filed at 03/23/2021 1700 Gross per 24 hour  Intake 1642.78 ml  Output 600 ml  Net 1042.78 ml   Last 3 Weights 03/23/2021 11/06/2020 11/07/2019  Weight (lbs) 95 lb 7.4 oz 104 lb 7 oz 100 lb 3.2 oz  Weight (kg) 43.3 kg 47.373 kg 45.45 kg     Body mass index is 18.04 kg/m.  General:  Well nourished, well developed, in no acute distress HEENT: normal Lymph: no adenopathy Neck: no JVD Endocrine:  No thryomegaly Vascular: No carotid bruits; FA pulses 2+ bilaterally without bruits  Cardiac:  normal S1, S2; RRR; no murmur Lungs:  clear to auscultation bilaterally, no wheezing, rhonchi or rales  Abd: soft, nontender, no hepatomegaly  Ext: no edema Musculoskeletal:  No deformities, BUE and BLE strength normal and equal Skin: warm and dry  Neuro:  CNs 2-12 intact, no focal abnormalities noted Psych:  Normal affect   EKG:  The EKG was personally reviewed and demonstrates: (03/21/21, 09:15:29), NSR 90, PR 158, QRS 76, Qtc 413, Q waves in V2, otherwise no ischemic changes   Telemetry:  Telemetry was personally reviewed and demonstrates: NSR  Relevant CV Studies:  CT C/A/P Result date: 03/21/21 1. Small pericardial effusion with enhancement of the pericardium worrisome for pericarditis. 2. Mild cardiomegaly. Enlargement of the main pulmonary artery compatible with pulmonary artery hypertension. 3. No acute localizing process in the abdomen or pelvis. 4. Sigmoid colon diverticulosis. 5. 1 cm  incidental thyroid nodule. No follow-up imaging is recommended. Reference: J Am Coll Radiol. 2015 Feb;12(2): 143-50 6.  Aortic Atherosclerosis (ICD10-I70.0).   Laboratory Data:  High Sensitivity Troponin:   Recent Labs  Lab 03/21/21 0935 03/21/21 1135 03/21/21 2014  TROPONINIHS 9 7 9      Chemistry Recent Labs  Lab 03/21/21 0935 03/22/21 0554 03/23/21 0100  NA 139 138 140  K 3.8 4.2 4.0  CL 104 107 106  CO2 21* 21* 24  GLUCOSE 129* 108* 146*  BUN 27* 40* 50*  CREATININE 0.99 1.00 1.15*  CALCIUM 9.1 8.5* 8.5*  GFRNONAA 57* 56* 48*  ANIONGAP 14 10 10     Recent Labs  Lab 03/21/21 0935 03/22/21 0554 03/23/21 0100  PROT 7.5 6.7 6.8  ALBUMIN 2.6* 2.2* 2.1*  AST 27 21 28   ALT 15 13 15   ALKPHOS 65 55 63  BILITOT 1.1 1.4* 1.0   Hematology Recent Labs  Lab 03/21/21 0935 03/22/21 0554 03/23/21 0100  WBC 20.5* 22.4* 14.0*  RBC 4.05 3.65* 3.86*  HGB 10.7* 9.6* 10.2*  HCT 32.4* 28.7* 29.6*  MCV 80.0 78.6* 76.7*  MCH 26.4 26.3 26.4  MCHC 33.0 33.4 34.5  RDW 14.7 14.6 14.7  PLT 505* 411* 440*   BNPNo results for input(s): BNP, PROBNP in the last 168 hours.  DDimer No results for input(s): DDIMER in the last 168 hours.  Radiology/Studies:  DG Chest 2 View  Result Date: 03/21/2021 CLINICAL DATA:  Chest pain beginning last night EXAM: CHEST - 2 VIEW COMPARISON:  08/10/2012 FINDINGS: Mild cardiomegaly. Tortuous aorta. The patient has taken a poor inspiration. Allowing for that, the lungs are clear. The vascularity is normal. No effusions. Surgical clips in the left axilla. IMPRESSION: Poor inspiration. Allowing for that, no active disease is suspected. Electronically Signed   By: Nelson Chimes M.D.   On: 03/21/2021 09:59  CT CHEST ABDOMEN PELVIS W CONTRAST  Result Date: 03/21/2021 CLINICAL DATA:  Chest pain and shortness of breath. Suspect infection. EXAM: CT CHEST, ABDOMEN, AND PELVIS WITH CONTRAST TECHNIQUE: Multidetector CT imaging of the chest, abdomen and pelvis  was performed following the standard protocol during bolus administration of intravenous contrast. CONTRAST:  59mL OMNIPAQUE IOHEXOL 350 MG/ML SOLN COMPARISON:  None. FINDINGS: CT CHEST FINDINGS Cardiovascular: Heart is mildly enlarged. Aorta is ectatic without focal dilatation. There is a small pericardial effusion with some mild pericardial enhancement seen best on image 3/39. The main pulmonary artery is enlarged compatible with pulmonary artery hypertension. Mediastinum/Nodes: There is a 1 cm hypodense right thyroid nodule and a 5 mm hypodense left thyroid nodule. There are nonenlarged mediastinal lymph nodes diffusely. Esophagus is within normal limits. Lungs/Pleura: There are mild atelectatic changes in the lower lungs bilaterally. There is no focal lung infiltrate, pleural effusion or pneumothorax. There is a calcified granuloma in the left lung. Musculoskeletal: No chest wall mass or suspicious bone lesions identified. CT ABDOMEN PELVIS FINDINGS Hepatobiliary: No focal liver abnormality is seen. No gallstones, gallbladder wall thickening, or biliary dilatation. Pancreas: Unremarkable. No pancreatic ductal dilatation or surrounding inflammatory changes. Spleen: Normal in size without focal abnormality. Adrenals/Urinary Tract: There is a rounded hypodensity in the left kidney which is too small to characterize, most likely a cyst. The kidneys, adrenal glands, and bladder are otherwise within normal limits. Stomach/Bowel: Stomach is within normal limits. Appendix is not seen. No evidence of bowel wall thickening, distention, or inflammatory changes. There is sigmoid colon diverticulosis without evidence for acute diverticulitis. Vascular/Lymphatic: Aortic atherosclerosis. No enlarged abdominal or pelvic lymph nodes. Reproductive: Uterus and bilateral adnexa are unremarkable. Other: No abdominal wall hernia or abnormality. No abdominopelvic ascites. Musculoskeletal: Multilevel degenerative changes affect the  spine IMPRESSION: 1. Small pericardial effusion with enhancement of the pericardium worrisome for pericarditis. 2. Mild cardiomegaly. Enlargement of the main pulmonary artery compatible with pulmonary artery hypertension. 3. No acute localizing process in the abdomen or pelvis. 4. Sigmoid colon diverticulosis. 5. 1 cm incidental thyroid nodule. No follow-up imaging is recommended. Reference: J Am Coll Radiol. 2015 Feb;12(2): 143-50 6.  Aortic Atherosclerosis (ICD10-I70.0). Electronically Signed   By: Ronney Asters M.D.   On: 03/21/2021 21:54   DG CHEST PORT 1 VIEW  Result Date: 03/23/2021 CLINICAL DATA:  Fever. EXAM: PORTABLE CHEST 1 VIEW COMPARISON:  03/21/2021 FINDINGS: 0519 hours. The cardio pericardial silhouette is enlarged. There is pulmonary vascular congestion without overt pulmonary edema. Interstitial markings are diffusely coarsened with chronic features. The visualized bony structures of the thorax show no acute abnormality. Telemetry leads overlie the chest. IMPRESSION: Chronic interstitial lung disease. No acute cardiopulmonary findings. Electronically Signed   By: Misty Stanley M.D.   On: 03/23/2021 06:58    HEART score (3), 0.9-1.7% MACE   Assessment and Plan:   Pericarditis Chest pain  Patient admitted with chest pain and found to have pericarditis based off inflammatory markers and small pericardial effusion on CT scan.  Troponins flat and not consistent with ACS.  Recent flu positive likely etiology.  Per chart review cardiology initially discussed case with care team and recommended colchicine.  Patient already on colchicine and prednisone added for symptom improvement as kidney function did not allow for NSAIDs.  Recommended routine echo be ordered based off pericardial effusion on CT scan diagnosis consistent with pericarditis.  Effusion does not appear to be large and patient is improving symptomatically so can continue on colchicine/short duration prednisone.  [facility]  will sign off.   Medication Recommendations: continue colchicine 0.6 mg daily (wt 43.3 kg), short duration prednisone ok if symptoms persistent otherwise d/c given recurrence risk  Other recommendations (labs, testing, etc): none Follow up as an outpatient: none  For questions or updates, please contact [facility] Please consult [email or web address] for contact info under   Signed, Dion Body, MD  03/23/2021 6:38 PM

## 2021-03-23 NOTE — Progress Notes (Signed)
   03/23/21 0411  Assess: MEWS Score  Temp (!) 102.3 F (39.1 C)  BP (!) 152/86  Pulse Rate (!) 113  ECG Heart Rate (!) 114  Resp (!) 27  Level of Consciousness Alert  SpO2 (!) 89 %  O2 Device Nasal Cannula  Patient Activity (if Appropriate) In bed  O2 Flow Rate (L/min) 2 L/min  Assess: MEWS Score  MEWS Temp 2  MEWS Systolic 0  MEWS Pulse 2  MEWS RR 2  MEWS LOC 0  MEWS Score 6  MEWS Score Color Red  Assess: if the MEWS score is Yellow or Red  Were vital signs taken at a resting state? Yes  Focused Assessment Change from prior assessment (see assessment flowsheet)  Early Detection of Sepsis Score *See Row Information* High  MEWS guidelines implemented *See Row Information* Yes  Treat  MEWS Interventions Administered prn meds/treatments;Escalated (See documentation below)  Take Vital Signs  Increase Vital Sign Frequency  Red: Q 1hr X 4 then Q 4hr X 4, if remains red, continue Q 4hrs  Escalate  MEWS: Escalate Red: discuss with charge nurse/RN and provider, consider discussing with RRT  Notify: Charge Nurse/RN  Name of Charge Nurse/RN Notified Lyda Kalata, RN  Date Charge Nurse/RN Notified 03/23/21  Time Charge Nurse/RN Notified 0411  Notify: Provider  Provider Name/Title B. Chotiner  Date Provider Notified 03/23/21  Time Provider Notified 0411  Notification Type Page  Notification Reason Change in status  Notify: Rapid Response  Name of Rapid Response RN Notified Waunita Schooner, RN  Date Rapid Response Notified 03/23/21  Time Rapid Response Notified 0411

## 2021-03-24 ENCOUNTER — Inpatient Hospital Stay (HOSPITAL_COMMUNITY): Payer: Medicare HMO

## 2021-03-24 DIAGNOSIS — I3139 Other pericardial effusion (noninflammatory): Secondary | ICD-10-CM

## 2021-03-24 DIAGNOSIS — B3323 Viral pericarditis: Secondary | ICD-10-CM

## 2021-03-24 LAB — C-REACTIVE PROTEIN: CRP: 22.6 mg/dL — ABNORMAL HIGH (ref <1.0)

## 2021-03-24 LAB — CBC WITH DIFFERENTIAL/PLATELET
Abs Immature Granulocytes: 0.06 10*3/uL (ref 0.00–0.07)
Basophils Absolute: 0 10*3/uL (ref 0.0–0.1)
Basophils Relative: 0 %
Eosinophils Absolute: 0 10*3/uL (ref 0.0–0.5)
Eosinophils Relative: 0 %
HCT: 29.2 % — ABNORMAL LOW (ref 36.0–46.0)
Hemoglobin: 9.8 g/dL — ABNORMAL LOW (ref 12.0–15.0)
Immature Granulocytes: 1 %
Lymphocytes Relative: 7 %
Lymphs Abs: 0.9 10*3/uL (ref 0.7–4.0)
MCH: 25.7 pg — ABNORMAL LOW (ref 26.0–34.0)
MCHC: 33.6 g/dL (ref 30.0–36.0)
MCV: 76.6 fL — ABNORMAL LOW (ref 80.0–100.0)
Monocytes Absolute: 0.5 10*3/uL (ref 0.1–1.0)
Monocytes Relative: 3 %
Neutro Abs: 11.8 10*3/uL — ABNORMAL HIGH (ref 1.7–7.7)
Neutrophils Relative %: 89 %
Platelets: 451 10*3/uL — ABNORMAL HIGH (ref 150–400)
RBC: 3.81 MIL/uL — ABNORMAL LOW (ref 3.87–5.11)
RDW: 14.6 % (ref 11.5–15.5)
WBC: 13.2 10*3/uL — ABNORMAL HIGH (ref 4.0–10.5)
nRBC: 0.2 % (ref 0.0–0.2)

## 2021-03-24 LAB — IRON AND TIBC
Iron: 49 ug/dL (ref 28–170)
Saturation Ratios: 31 % (ref 10.4–31.8)
TIBC: 157 ug/dL — ABNORMAL LOW (ref 250–450)
UIBC: 108 ug/dL

## 2021-03-24 LAB — ECHOCARDIOGRAM COMPLETE
Area-P 1/2: 4.71 cm2
Height: 61 in
S' Lateral: 2.3 cm
Weight: 1527.35 oz

## 2021-03-24 LAB — COMPREHENSIVE METABOLIC PANEL
ALT: 19 U/L (ref 0–44)
AST: 59 U/L — ABNORMAL HIGH (ref 15–41)
Albumin: 2.1 g/dL — ABNORMAL LOW (ref 3.5–5.0)
Alkaline Phosphatase: 98 U/L (ref 38–126)
Anion gap: 9 (ref 5–15)
BUN: 44 mg/dL — ABNORMAL HIGH (ref 8–23)
CO2: 22 mmol/L (ref 22–32)
Calcium: 8.3 mg/dL — ABNORMAL LOW (ref 8.9–10.3)
Chloride: 109 mmol/L (ref 98–111)
Creatinine, Ser: 0.98 mg/dL (ref 0.44–1.00)
GFR, Estimated: 58 mL/min — ABNORMAL LOW (ref >60)
Glucose, Bld: 121 mg/dL — ABNORMAL HIGH (ref 70–99)
Potassium: 4.4 mmol/L (ref 3.5–5.1)
Sodium: 140 mmol/L (ref 135–145)
Total Bilirubin: 0.7 mg/dL (ref 0.3–1.2)
Total Protein: 6.9 g/dL (ref 6.5–8.1)

## 2021-03-24 LAB — RETICULOCYTES
Immature Retic Fract: 5.8 % (ref 2.3–15.9)
RBC.: 3.68 MIL/uL — ABNORMAL LOW (ref 3.87–5.11)
Retic Count, Absolute: 24.7 10*3/uL (ref 19.0–186.0)
Retic Ct Pct: 0.7 % (ref 0.4–3.1)

## 2021-03-24 LAB — MAGNESIUM: Magnesium: 2.9 mg/dL — ABNORMAL HIGH (ref 1.7–2.4)

## 2021-03-24 LAB — FERRITIN: Ferritin: >7500 ng/mL — ABNORMAL HIGH (ref 11–307)

## 2021-03-24 LAB — VITAMIN B12: Vitamin B-12: 2066 pg/mL — ABNORMAL HIGH (ref 180–914)

## 2021-03-24 LAB — FOLATE: Folate: 7.8 ng/mL (ref >5.9)

## 2021-03-24 LAB — PHOSPHORUS: Phosphorus: 2.4 mg/dL — ABNORMAL LOW (ref 2.5–4.6)

## 2021-03-24 LAB — SEDIMENTATION RATE: Sed Rate: 120 mm/hr — ABNORMAL HIGH (ref 0–22)

## 2021-03-24 MED ORDER — LEVALBUTEROL HCL 0.63 MG/3ML IN NEBU
0.6300 mg | INHALATION_SOLUTION | Freq: Three times a day (TID) | RESPIRATORY_TRACT | Status: DC
  Administered 2021-03-24 – 2021-03-25 (×4): 0.63 mg via RESPIRATORY_TRACT
  Filled 2021-03-24 (×5): qty 3

## 2021-03-24 MED ORDER — K PHOS MONO-SOD PHOS DI & MONO 155-852-130 MG PO TABS
500.0000 mg | ORAL_TABLET | Freq: Once | ORAL | Status: AC
  Administered 2021-03-24: 10:00:00 500 mg via ORAL
  Filled 2021-03-24: qty 2

## 2021-03-24 MED ORDER — SENNOSIDES-DOCUSATE SODIUM 8.6-50 MG PO TABS
1.0000 | ORAL_TABLET | Freq: Two times a day (BID) | ORAL | Status: DC
  Administered 2021-03-24 – 2021-04-06 (×25): 1 via ORAL
  Filled 2021-03-24 (×24): qty 1

## 2021-03-24 MED ORDER — IPRATROPIUM BROMIDE 0.02 % IN SOLN
0.5000 mg | Freq: Three times a day (TID) | RESPIRATORY_TRACT | Status: DC
  Administered 2021-03-24 – 2021-03-25 (×4): 0.5 mg via RESPIRATORY_TRACT
  Filled 2021-03-24 (×5): qty 2.5

## 2021-03-24 MED ORDER — POLYETHYLENE GLYCOL 3350 17 G PO PACK
17.0000 g | PACK | Freq: Two times a day (BID) | ORAL | Status: DC
  Administered 2021-03-24 – 2021-04-06 (×18): 17 g via ORAL
  Filled 2021-03-24 (×21): qty 1

## 2021-03-24 NOTE — Plan of Care (Signed)
  Problem: Education: Goal: Knowledge of General Education information will improve Description: Including pain rating scale, medication(s)/side effects and non-pharmacologic comfort measures Outcome: Progressing   Problem: Clinical Measurements: Goal: Will remain free from infection Outcome: Progressing Goal: Cardiovascular complication will be avoided Outcome: Progressing   Problem: Coping: Goal: Level of anxiety will decrease Outcome: Progressing   Problem: Activity: Goal: Risk for activity intolerance will decrease Outcome: Not Progressing

## 2021-03-24 NOTE — Progress Notes (Signed)
PROGRESS NOTE    Nicole Rush  IWO:[id number] DOB: 1940/12/21 DOA: 03/21/2021 PCP: Lin Landsman, MD   Brief Narrative:  The patient is an 80 year old African-American female with a past medical history significant for but not limited to asthma, cancer, hypertension, history of CVA as well as recent flu presented to the ED with a chief complaint of 1 to 2 weeks of chest pain.  She reports a pressure in her chest that has been intermittent and she is not sure what makes it worse but she does state that eating ice makes it better and laying flat makes it worse but she does not know if leaning forward makes it better.  She has had associated dyspnea for the last 1 to 2 weeks and has had nausea and vomiting 2 times per day.  She reports there appears to be undigested food and also reports that she has lost weight and has had palpitations.  She is not sure if her symptoms are exertional or not.  She is diagnosed with the flu on the 27th of November have been prescribed Tamiflu.  She reports a decreased appetite and no other complaints or concerns at this time but she does state that she has back and belly pain associated to the EDP.  In the ED she was noted to have a temperature 98.2 respiratory rate of 16 and 25.  I will stop blood pressure and saturations 96%.  Troponins were normal and downtrending.  She is given nitroglycerin with improvement in her symptoms but does not see a cardiologist. Chest x-ray showed poor inspiration but no active disease.  EKG showed heart rate of 95 with diffuse ST changes.  She was admitted for pericarditis associated the flu and cardiology was consulted and recommended starting the patient on colchicine.  She has now been initiated on Prednisone 20 mg po Daily and Cardiology recommending obtaining an ECHOCardiogram which has been done and pending read.   Assessment & Plan:   Principal Problem:   Pericarditis Active Problems:   Protein-calorie malnutrition, moderate  (HCC)   Hyperlipidemia   Essential hypertension   Leukocytosis   Falls  Pericarditis with small Pericardial Effusion -Associated likely from Influenza -Continue Colchicine 0.6 mg po Daily and with Pain control with Acetaminophen 650 mg po q6hprn Mild Pain, Morphine 2 mg IV q2prn, and Oxycodone 5 mg po q4hprn; the EDP discussed with Dr. Mali Hilty and he feels like there is diffuse ST elevation and wonders if the patient has pericarditis.  He did not recommend any additional troponin testing but did recommend CT of the chest to evaluate for pericardial effusion no pericardial effusion cardiology does not need to see the patient necessarily -CT Scan showed "Small pericardial effusion with enhancement of the pericardium worrisome for pericarditis.  Mild cardiomegaly. Enlargement of the main pulmonary artery compatible with pulmonary artery hypertension.  No acute localizing process in the abdomen or pelvis. Sigmoid colon diverticulosis. 1 cm incidental thyroid nodule. No follow-up imaging is recommended. Aortic Atherosclerosis." -WBC went from 20.5 -> 22.4 and is trended down to 14.0 -CRP was 36.3 and and ESR was 108; Repeat Inflammatory markers in the AM  -IVF Hydration now to stop  -Continue to monitor on telemetry -We will discuss with Cardiology about further recommendations and we we were going to start Ibuprofen 600 mg TID and added GI protection with po PPI Pantoprazole 40 mg daily but with her CrCl around 28 so will will start 20 mg po Prednisone Daily -Will obtain ECHOCardiogram  for further evaluation and this has been done and pending read -We will need to monitor the patient's renal function carefully given that we are starting NSAIDs; if NSAIDs worsening renal function may consider steroids then   Influenza A -Continue Oseltamivir 30 mg po BID  -CXR this AM showed "The cardio pericardial silhouette is enlarged. There is pulmonary vascular congestion without overt pulmonary edema.  Interstitial markings are diffusely coarsened with chronic features. The visualized bony structures of the thorax show no acute abnormality. Telemetry leads overlie the chest  -Initially diagnosed on November 27 -C/w Supportive Care  Acute Respiratory Failure with Hypoxia -from Above -SpO2: 98 % O2 Flow Rate (L/min): 5 L/min -CXR this AM showed "The cardio pericardial silhouette is enlarged. Streaky opacity at the lung bases suggest atelectasis. No overt edema or dense focal airspace consolidation. The visualized bony structures of the thorax show no acute abnormality. Gaseous bowel distention noted in the visualized upper abdomen. Telemetry leads overlie the chest." -Flutter Valve, Incentive Spirometry, and Guaifenesin 1200 mg po BID -Started Xopenex/Atrovent q6h  -Continuous Pulse Oximetry and Maintain O2 Saturations >90% -Continue Supplemental O2 via [place] and wean O2 as tolerated -Repeat CXR in the AM and will need an Ambulatory Home O2 screen   Moderate protein calorie malnutrition -Encourage nutrient dense food choices -Has had poor po Intake so will consult Nutrition and add Ensure Enlive 237 mL po BID   Hypertension -With hypotension in the ED -Continue Holding losartan and hydrochlorothiazide -Fluids as above -Continue to Monitor BP per Protocol  -Last BP was on the softer side of 112/73  Urinary Retention -Bladder Scan showed 400 mL -Strict I's and O's -If Necessary will do I and O Cath and if continues to retain may need Foley    Thyroid Nodule -Noted to have 1 cm Nodule Incidental Thyroid Nodule -Outpatient U/S recommended    Hyperlipidemia -Continue Rosuvastatin 20 mg po Daily    Metabolic Acidosis -Mild and now improved as a CO2 is now 22, anion gap is 9, chloride level is 109 -IVF has now been discontinued -Continue to Monitor and Trend -Repeat CMP in the AM    Thrombocytosis -Mild and Likely reactive from above -Platelet Count went from 505 -> 411 -> 440 ->  451 today -Continue to Monitor and Trend -Repeat CBC in the AM   Hypophosphatemia  -Mild. Phos Level was 2.4 -Replete with po K Phos Neutral 500 mg x1 -Continue to Monitor and Trend  -Repeat Phos Level in the AM  Hyperbilirubinemia -Mild and Improved -T Bili went from 1.4 -> 1.0 -> 0.7 -Continue to Monitor and Trend -Repeat CMP in the AM  Constipation -Bowel Regimen to be initiated with Miralax 17 grams po BID and Senna-Docusate 1 tab po BID   Falls -PT and OT to further evaluate and treat and they are recommendign Home Health PT with Rolling Walker with 2 Wheels and BSC/3in1   Poor Po Intake -Consult Nutritionist for further evaluation; see above    Microcytic Anemia -Patient's Hgb/Hct went from 10.7/32.4 -> 9.6/28.7 -> 10.2/29.6 -> 9.8/29.2 -Check Anemia Panel in the AM -Continue to Monitor for S/Sx of Bleeding; No overt bleeding noted -Repeat CBC in theAM   DVT prophylaxis: Heparin 5,000 units sq q8h Code Status: FULL CODE  Family Communication: No family present at bedside  Disposition Plan: Needs further clinical Improvement prior to D/C and will likely go home with Home Health   Status is: Inpatient  Remains inpatient appropriate because: Continues to spike temperatures  and was very agitated. Will need further clinical improvement prior to safe D/C Disposition   Consultants:  Discuss with Cardiology   Procedures: CTA Chest   Antimicrobials:  Anti-infectives (From admission, onward)    Start     Dose/Rate Route Frequency Ordered Stop   03/22/21 1000  oseltamivir (TAMIFLU) capsule 30 mg       Note to Pharmacy: Tamiflu 30 mg BID for CrCl <  60 mL/min   30 mg Oral 2 times daily 03/22/21 0304 03/23/21 2208        Subjective: Seen and examined at bedside and states she was doing better. States she actually got some rest last night. No CP now. Breathing is ok. Still has not had bowel movement. No nausea or vomiting. No other concerns or complaints at this time.    Objective: Vitals:   03/24/21 0247 03/24/21 0335 03/24/21 0730 03/24/21 0805  BP:  107/72 112/73   Pulse:  100 89   Resp:  20 20   Temp:  99.2 F (37.3 C) 98.4 F (36.9 C)   TempSrc:  Oral Oral   SpO2: 96% 95% 96% 98%  Weight:      Height:        Intake/Output Summary (Last 24 hours) at 03/24/2021 0816 Last data filed at 03/23/2021 1700 Gross per 24 hour  Intake 200 ml  Output 400 ml  Net -200 ml    Filed Weights   03/23/21 1800  Weight: 43.3 kg   Examination: Physical Exam:  Constitutional: Thin elderly AAF in NAD and appears calm and comfortable Eyes: Lids and conjunctivae normal, sclerae anicteric  ENMT: External Ears, Nose appear normal. Grossly normal hearing Neck: Appears normal, supple, no cervical masses, normal ROM, no appreciable thyromegaly; no appreciable JVD Respiratory: Diminished to auscultation bilaterally with coarse breath sounds and some slight rhonchi, no wheezing, rales,  crackles. Normal respiratory effort and patient is not tachypenic. No accessory muscle use. Wearing supplemental O2 via [place] but has unlabored breathing  Cardiovascular: RRR, no murmurs / rubs / gallop. No edema  Abdomen: Soft, non-tender, non-distended. Bowel sounds positive.  GU: Deferred. Musculoskeletal: No clubbing / cyanosis of digits/nails. No joint deformity upper and lower extremities.  Skin: No rashes, lesions, ulcers on a limited skin evaluation. No induration; Warm and dry.  Neurologic: CN 2-12 grossly intact with no focal deficits. Romberg sign cerebellar reflexes not assessed.  Psychiatric: Normal judgment and insight. Alert and oriented x 3. Pleasant and Normal mood and appropriate affect.   Data Reviewed: I have personally reviewed following labs and imaging studies  CBC: Recent Labs  Lab 03/21/21 0935 03/22/21 0554 03/23/21 0100 03/24/21 0149  WBC 20.5* 22.4* 14.0* 13.2*  NEUTROABS 18.1* 19.8* 12.5* 11.8*  HGB 10.7* 9.6* 10.2* 9.8*  HCT 32.4* 28.7* 29.6*  29.2*  MCV 80.0 78.6* 76.7* 76.6*  PLT 505* 411* 440* 451*    Basic Metabolic Panel: Recent Labs  Lab 03/21/21 0935 03/22/21 0554 03/23/21 0100 03/24/21 0149  NA 139 138 140 140  K 3.8 4.2 4.0 4.4  CL 104 107 106 109  CO2 21* 21* 24 22  GLUCOSE 129* 108* 146* 121*  BUN 27* 40* 50* 44*  CREATININE 0.99 1.00 1.15* 0.98  CALCIUM 9.1 8.5* 8.5* 8.3*  MG  --  2.5* 2.8* 2.9*  PHOS  --   --  3.8 2.4*    GFR: Estimated Creatinine Clearance: 30.3 mL/min (by C-G formula based on SCr of 0.98 mg/dL). Liver Function Tests: Recent Labs  Lab 03/21/21 0935 03/22/21 0554 03/23/21 0100 03/24/21 0149  AST _0 59*  ALT _1 ALKPHOS 65 55 63 98  BILITOT 1.1 1.4* 1.0 0.7  PROT 7.5 6.7 6.8 6.9  ALBUMIN 2.6* 2.2* 2.1* 2.1*    Recent Labs  Lab 03/21/21 0935  LIPASE 38    No results for input(s): AMMONIA in the last 168 hours. Coagulation Profile: No results for input(s): INR, PROTIME in the last 168 hours. Cardiac Enzymes: No results for input(s): CKTOTAL, CKMB, CKMBINDEX, TROPONINI in the last 168 hours. BNP (last 3 results) No results for input(s): PROBNP in the last 8760 hours. HbA1C: No results for input(s): HGBA1C in the last 72 hours. CBG: Recent Labs  Lab 03/22/21 1557  GLUCAP 109*    Lipid Profile: No results for input(s): CHOL, HDL, LDLCALC, TRIG, CHOLHDL, LDLDIRECT in the last 72 hours. Thyroid Function Tests: No results for input(s): TSH, T4TOTAL, FREET4, T3FREE, THYROIDAB in the last 72 hours. Anemia Panel: No results for input(s): VITAMINB12, FOLATE, FERRITIN, TIBC, IRON, RETICCTPCT in the last 72 hours. Sepsis Labs: No results for input(s): PROCALCITON, LATICACIDVEN in the last 168 hours.  Recent Results (from the past 240 hour(s))  Resp Panel by RT-PCR (Flu A&B, Covid) Nasopharyngeal Swab     Status: Abnormal   Collection Time: 03/17/21  1:52 AM   Specimen: Nasopharyngeal Swab; Nasopharyngeal(NP) swabs in vial transport medium  Result  Value Ref Range Status   SARS Coronavirus 2 by RT PCR NEGATIVE NEGATIVE Final    Comment: (NOTE) SARS-CoV-2 target nucleic acids are NOT DETECTED.  The SARS-CoV-2 RNA is generally detectable in upper respiratory specimens during the acute phase of infection. The lowest concentration of SARS-CoV-2 viral copies this assay can detect is 138 copies/mL. A negative result does not preclude SARS-Cov-2 infection and should not be used as the sole basis for treatment or other patient management decisions. A negative result may occur with  improper specimen collection/handling, submission of specimen other than nasopharyngeal swab, presence of viral mutation(s) within the areas targeted by this assay, and inadequate number of viral copies(<138 copies/mL). A negative result must be combined with clinical observations, patient history, and epidemiological information. The expected result is Negative.  Fact Sheet for Patients:  EntrepreneurPulse.com.au  Fact Sheet for Healthcare Providers:  IncredibleEmployment.be  This test is no t yet approved or cleared by the [place] FDA and  has been authorized for detection and/or diagnosis of SARS-CoV-2 by FDA under an Emergency Use Authorization (EUA). This EUA will remain  in effect (meaning this test can be used) for the duration of the COVID-19 declaration under Section 564(b)(1) of the Act, 21 U.S.C.section 360bbb-3(b)(1), unless the authorization is terminated  or revoked sooner.       Influenza A by PCR POSITIVE (A) NEGATIVE Final   Influenza B by PCR NEGATIVE NEGATIVE Final    Comment: (NOTE) The Xpert Xpress SARS-CoV-2/FLU/RSV plus assay is intended as an aid in the diagnosis of influenza from Nasopharyngeal swab specimens and should not be used as a sole basis for treatment. Nasal washings and aspirates are unacceptable for Xpert Xpress SARS-CoV-2/FLU/RSV testing.  Fact Sheet for  Patients: EntrepreneurPulse.com.au  Fact Sheet for Healthcare Providers: IncredibleEmployment.be  This test is not yet approved or cleared by the [place] FDA and has been authorized for detection and/or diagnosis of SARS-CoV-2 by FDA under an Emergency Use Authorization (EUA). This EUA will remain in effect (meaning this test can be used) for the duration  of the COVID-19 declaration under Section 564(b)(1) of the Act, 21 U.S.C. section 360bbb-3(b)(1), unless the authorization is terminated or revoked.  Performed at [facility], [place] [address], [place], [place] [address]   Resp Panel by RT-PCR (Flu A&B, Covid) Nasopharyngeal Swab     Status: Abnormal   Collection Time: 03/22/21  9:29 AM   Specimen: Nasopharyngeal Swab; Nasopharyngeal(NP) swabs in vial transport medium  Result Value Ref Range Status   SARS Coronavirus 2 by RT PCR NEGATIVE NEGATIVE Final    Comment: (NOTE) SARS-CoV-2 target nucleic acids are NOT DETECTED.  The SARS-CoV-2 RNA is generally detectable in upper respiratory specimens during the acute phase of infection. The lowest concentration of SARS-CoV-2 viral copies this assay can detect is 138 copies/mL. A negative result does not preclude SARS-Cov-2 infection and should not be used as the sole basis for treatment or other patient management decisions. A negative result may occur with  improper specimen collection/handling, submission of specimen other than nasopharyngeal swab, presence of viral mutation(s) within the areas targeted by this assay, and inadequate number of viral copies(<138 copies/mL). A negative result must be combined with clinical observations, patient history, and epidemiological information. The expected result is Negative.  Fact Sheet for Patients:  EntrepreneurPulse.com.au  Fact Sheet for Healthcare Providers:  IncredibleEmployment.be  This  test is no t yet approved or cleared by the [place] FDA and  has been authorized for detection and/or diagnosis of SARS-CoV-2 by FDA under an Emergency Use Authorization (EUA). This EUA will remain  in effect (meaning this test can be used) for the duration of the COVID-19 declaration under Section 564(b)(1) of the Act, 21 U.S.C.section 360bbb-3(b)(1), unless the authorization is terminated  or revoked sooner.       Influenza A by PCR POSITIVE (A) NEGATIVE Final   Influenza B by PCR NEGATIVE NEGATIVE Final    Comment: (NOTE) The Xpert Xpress SARS-CoV-2/FLU/RSV plus assay is intended as an aid in the diagnosis of influenza from Nasopharyngeal swab specimens and should not be used as a sole basis for treatment. Nasal washings and aspirates are unacceptable for Xpert Xpress SARS-CoV-2/FLU/RSV testing.  Fact Sheet for Patients: EntrepreneurPulse.com.au  Fact Sheet for Healthcare Providers: IncredibleEmployment.be  This test is not yet approved or cleared by the [place] FDA and has been authorized for detection and/or diagnosis of SARS-CoV-2 by FDA under an Emergency Use Authorization (EUA). This EUA will remain in effect (meaning this test can be used) for the duration of the COVID-19 declaration under Section 564(b)(1) of the Act, 21 U.S.C. section 360bbb-3(b)(1), unless the authorization is terminated or revoked.  Performed at [facility], [place] [address] [place], [place] [address]      RN Pressure Injury Documentation:     Estimated body mass index is 18.04 kg/m as calculated from the following:   Height as of this encounter: 5' 1" (1.549 m).   Weight as of this encounter: 43.3 kg.  Malnutrition Type:   Malnutrition Characteristics:   Nutrition Interventions:    Radiology Studies: DG CHEST PORT 1 VIEW  Result Date: 03/23/2021 CLINICAL DATA:  Fever. EXAM: PORTABLE CHEST 1 VIEW COMPARISON:  03/21/2021 FINDINGS:  0519 hours. The cardio pericardial silhouette is enlarged. There is pulmonary vascular congestion without overt pulmonary edema. Interstitial markings are diffusely coarsened with chronic features. The visualized bony structures of the thorax show no acute abnormality. Telemetry leads overlie the chest. IMPRESSION: Chronic interstitial lung disease. No acute cardiopulmonary findings. Electronically Signed  By: Misty Stanley M.D.   On: 03/23/2021 06:58    Scheduled Meds:  colchicine  0.6 mg Oral Daily   feeding supplement  237 mL Oral BID BM   guaiFENesin  1,200 mg Oral BID   heparin  5,000 Units Subcutaneous Q8H   ipratropium  0.5 mg Nebulization Q6H   levalbuterol  0.63 mg Nebulization Q6H   loratadine  10 mg Oral Daily   pantoprazole  40 mg Oral Daily   phosphorus  500 mg Oral Once   predniSONE  20 mg Oral Q breakfast   rosuvastatin  20 mg Oral Daily   Continuous Infusions:  sodium chloride      LOS: 3 days   Kerney Elbe, DO [facility] PAGER is on AMION  If 7PM-7AM, please contact night-coverage [email or web address]

## 2021-03-24 NOTE — Progress Notes (Signed)
  Echocardiogram 2D Echocardiogram has been performed.  Fidel Levy 03/24/2021, 9:38 AM

## 2021-03-25 ENCOUNTER — Inpatient Hospital Stay (HOSPITAL_COMMUNITY): Payer: Medicare HMO

## 2021-03-25 LAB — CBC WITH DIFFERENTIAL/PLATELET
Abs Immature Granulocytes: 0.07 10*3/uL (ref 0.00–0.07)
Basophils Absolute: 0 10*3/uL (ref 0.0–0.1)
Basophils Relative: 0 %
Eosinophils Absolute: 0 10*3/uL (ref 0.0–0.5)
Eosinophils Relative: 0 %
HCT: 26.7 % — ABNORMAL LOW (ref 36.0–46.0)
Hemoglobin: 9.1 g/dL — ABNORMAL LOW (ref 12.0–15.0)
Immature Granulocytes: 1 %
Lymphocytes Relative: 9 %
Lymphs Abs: 1 10*3/uL (ref 0.7–4.0)
MCH: 25.9 pg — ABNORMAL LOW (ref 26.0–34.0)
MCHC: 34.1 g/dL (ref 30.0–36.0)
MCV: 75.9 fL — ABNORMAL LOW (ref 80.0–100.0)
Monocytes Absolute: 0.5 10*3/uL (ref 0.1–1.0)
Monocytes Relative: 4 %
Neutro Abs: 9.6 10*3/uL — ABNORMAL HIGH (ref 1.7–7.7)
Neutrophils Relative %: 86 %
Platelets: 406 10*3/uL — ABNORMAL HIGH (ref 150–400)
RBC: 3.52 MIL/uL — ABNORMAL LOW (ref 3.87–5.11)
RDW: 14.5 % (ref 11.5–15.5)
WBC: 11.2 10*3/uL — ABNORMAL HIGH (ref 4.0–10.5)
nRBC: 0 % (ref 0.0–0.2)

## 2021-03-25 LAB — MAGNESIUM: Magnesium: 2.4 mg/dL (ref 1.7–2.4)

## 2021-03-25 LAB — COMPREHENSIVE METABOLIC PANEL
ALT: 19 U/L (ref 0–44)
AST: 47 U/L — ABNORMAL HIGH (ref 15–41)
Albumin: 1.8 g/dL — ABNORMAL LOW (ref 3.5–5.0)
Alkaline Phosphatase: 79 U/L (ref 38–126)
Anion gap: 8 (ref 5–15)
BUN: 37 mg/dL — ABNORMAL HIGH (ref 8–23)
CO2: 23 mmol/L (ref 22–32)
Calcium: 8.4 mg/dL — ABNORMAL LOW (ref 8.9–10.3)
Chloride: 106 mmol/L (ref 98–111)
Creatinine, Ser: 0.74 mg/dL (ref 0.44–1.00)
GFR, Estimated: >60 mL/min (ref >60)
Glucose, Bld: 153 mg/dL — ABNORMAL HIGH (ref 70–99)
Potassium: 4.3 mmol/L (ref 3.5–5.1)
Sodium: 137 mmol/L (ref 135–145)
Total Bilirubin: 0.4 mg/dL (ref 0.3–1.2)
Total Protein: 6.1 g/dL — ABNORMAL LOW (ref 6.5–8.1)

## 2021-03-25 LAB — PHOSPHORUS: Phosphorus: 2.7 mg/dL (ref 2.5–4.6)

## 2021-03-25 NOTE — Telephone Encounter (Signed)
I have openings on 16th afternoon for 30 mins slot

## 2021-03-25 NOTE — Care Management Important Message (Signed)
Important Message  Patient Details  Name: Nicole Rush MRN: [id number] Date of Birth: 11-07-1940   Medicare Important Message Given:  Yes CORRECTION  PATIENT HAS A PRECAUTION IN PLACE WILL MAIL TO THE PATIENT HOME ADDRESS    Orbie Pyo 03/25/2021, 3:35 PM

## 2021-03-25 NOTE — Care Management Important Message (Signed)
Important Message  Patient Details  Name: Nicole Rush MRN: [id number] Date of Birth: 05-14-1940   Medicare Important Message Given:  Yes     Orbie Pyo 03/25/2021, 3:01 PM

## 2021-03-25 NOTE — TOC Transition Note (Addendum)
Transition of Care [facility]) - CM/SW Discharge Note   Patient Details  Name: Glendoris Nodarse MRN: [id number] Date of Birth: 11/25/1940  Transition of Care [facility] CM/SW Contact:  Zenon Mayo, RN Phone Number: 03/25/2021, 4:45 PM   Clinical Narrative:    NCM spoke with patient, she passed the phone to her son, NCM offered choice for [facility], he and patient states they do not have a preference and they are ok with [employer] supplying DME.  NCM made referral to [facility] with [facility].  He is able to take referral. Soc will begin 24 to 48 hrs post dc. [employer] will bring DME to room prior to dc. NCM left [name] a message for return call, Donna's number is [phone].   12/6- NCM spoke with [name] , she states patient will need to go to a SNF, will have to have pt to re eval patient. CSW to follow.   Final next level of care: [place] Barriers to Discharge: Continued Medical Work up   Patient Goals and CMS Choice Patient states their goals for this hospitalization and ongoing recovery are:: return home CMS Medicare.gov Compare Post Acute Care list provided to:: Patient Represenative (must comment) Choice offered to / list presented to : Adult Children  Discharge Placement                       Discharge Plan and Services                DME Arranged: Bedside commode, Walker rolling,hospital bed DME Agency: [employer] Date DME Agency Contacted: 03/25/21 Time DME Agency Contacted: [phone] Representative spoke with at DME Agency: [place]: PT, OT [place] Agency: [place] Date [place]: 03/25/21 Time [place]: [place] Representative spoke with at [place]: [place] Determinants of Health ([place]) Interventions     Readmission Risk Interventions No flowsheet data found.

## 2021-03-25 NOTE — Evaluation (Signed)
Occupational Therapy Evaluation Patient Details Name: Nicole Rush MRN: [id number] DOB: 1941/04/19 Today's Date: 03/25/2021   History of Present Illness The pt is an 80 yo female presenting 12/1 with c/o chest pain. Upon work up, suspect pericarditis which is likley related to recent dx of the flu. PMH includes: asthma, breast cancer, HTN, and stroke with no residual deficits.   Clinical Impression   This 80 yo female admitted with above presents to acute OT with PLOF of being totally independent with basic ADLs prior to having the flu recently and since that time has needed increased A and has been living with family to provide this for her. She currently is setup/S-Mod A for basic ADLs and min A with bed mobility, transfers, and ambulation with RW. She will continue to benefit from acute OT with follow up [place].      Recommendations for follow up therapy are one component of a multi-disciplinary discharge planning process, led by the attending physician.  Recommendations may be updated based on patient status, additional functional criteria and insurance authorization.   Follow Up Recommendations  Home health OT    Assistance Recommended at Discharge Frequent or constant Supervision/Assistance  Functional Status Assessment  Patient has had a recent decline in their functional status and demonstrates the ability to make significant improvements in function in a reasonable and predictable amount of time.  Equipment Recommendations  BSC/3in1       Precautions / Restrictions Precautions Precautions: Fall Precaution Comments: watch O2 Restrictions Weight Bearing Restrictions: No      Mobility Bed Mobility Overal bed mobility: Needs Assistance Bed Mobility: Supine to Sit     Supine to sit: Min assist     General bed mobility comments: Min A for trunk and increased time, able to scoot to EOB by self with increased time    Transfers Overall transfer level:  Independent Equipment used: Rolling walker (2 wheels) Transfers: Sit to/from Stand Sit to Stand: Min assist;Mod assist           General transfer comment: First tried to stand without RW in front of her and she was Mod A, wiht RW min A      Balance Overall balance assessment: Needs assistance Sitting-balance support: No upper extremity supported;Feet supported Sitting balance-Leahy Scale: Fair     Standing balance support: Bilateral upper extremity supported;Reliant on assistive device for balance Standing balance-Leahy Scale: Poor                             ADL either performed or assessed with clinical judgement   ADL Overall ADL's : Needs assistance/impaired Eating/Feeding: Independent Eating/Feeding Details (indicate cue type and reason): supported sitting Grooming: Oral care Grooming Details (indicate cue type and reason): supported sitting. was going to try and do it in standing but pt said she was too shakey to stand and brush her teeth Upper Body Bathing: Set up;Supervision/ safety Upper Body Bathing Details (indicate cue type and reason): supported sitting Lower Body Bathing: Moderate assistance Lower Body Bathing Details (indicate cue type and reason): min A sit<>stand Upper Body Dressing : Minimal assistance Upper Body Dressing Details (indicate cue type and reason): supported sitting Lower Body Dressing: Moderate assistance Lower Body Dressing Details (indicate cue type and reason): min A sit<>stand Toilet Transfer: Minimal assistance;Ambulation;Rolling walker (2 wheels) Toilet Transfer Details (indicate cue type and reason): simulated bed>sink>brush teeth seated in chair>to recliner Toileting- Clothing Manipulation and Hygiene: Moderate assistance Toileting -  Clothing Manipulation Details (indicate cue type and reason): min A sit<>stand             Vision Baseline Vision/History: 1 Wears glasses (reading only) Ability to See in Adequate  Light: 0 Adequate Patient Visual Report: No change from baseline              Pertinent Vitals/Pain Pain Assessment: No/denies pain     Hand Dominance Right   Extremity/Trunk Assessment Upper Extremity Assessment Upper Extremity Assessment: Generalized weakness           Communication Communication Communication: No difficulties   Cognition Arousal/Alertness: Awake/alert Behavior During Therapy: Flat affect Overall Cognitive Status: Impaired/Different from baseline                                 General Comments: was asking pt about her home setup and bathing--she was unable to tell me if it was a tub/shower or a walkin shower or if she sponge bathes     General Comments  Sats on RA dropped to low 80's at times (but wave form was never great); on 3 liters pts O2 stayed in low 90's            Home Living Family/patient expects to be discharged to:: Private residence Living Arrangements: Children;Other relatives Available Help at Discharge: Family;Available 24 hours/day (grand-daughter) Type of Home: House Home Access: Stairs to enter [employer] of Steps: 5 Entrance Stairs-Rails: Right Home Layout: One level     Bathroom Shower/Tub: Teacher, early years/pre: Standard     Home Equipment: None   Additional Comments: pt living with one family member (granddaughter who was present at session), will be moving to different family member's home soon where she has a flight of stairs to the bedrooms      Prior Functioning/Environment Prior Level of Function : Independent/Modified Independent;Driving             Mobility Comments: prior to start of flu on 11/27, pt independent without DME, walking "miles" each day for exercise. ADLs Comments: pt independent prior to onset of flu on 11/27, now needing assist from family        OT Problem List: Decreased strength;Decreased activity tolerance;Impaired balance (sitting and/or  standing);Cardiopulmonary status limiting activity      OT Treatment/Interventions: Self-care/ADL training;DME and/or AE instruction;Patient/family education;Balance training;Energy conservation    OT Goals(Current goals can be found in the care plan section) Acute Rehab OT Goals Patient Stated Goal: to get sronger and go home with grand daugther OT Goal Formulation: With patient Time For Goal Achievement: 04/08/21 Potential to Achieve Goals: Good  OT Frequency: Min 2X/week              AM-PAC OT "6 Clicks" Daily Activity     Outcome Measure Help from another person eating meals?: None Help from another person taking care of personal grooming?: A Little Help from another person toileting, which includes using toliet, bedpan, or urinal?: A Lot Help from another person bathing (including washing, rinsing, drying)?: A Lot Help from another person to put on and taking off regular upper body clothing?: A Lot Help from another person to put on and taking off regular lower body clothing?: A Lot 6 Click Score: 15   End of Session Equipment Utilized During Treatment: Gait belt;Rolling walker (2 wheels) (O2 3 liters)  Activity Tolerance: Patient limited by fatigue Patient left: in chair;with call bell/phone  within reach;with chair alarm set  OT Visit Diagnosis: Unsteadiness on feet (R26.81);Other abnormalities of gait and mobility (R26.89);Muscle weakness (generalized) (M62.81)                Time: 2542-7062 OT Time Calculation (min): 32 min Charges:  OT General Charges $OT Visit: 1 Visit OT Evaluation $OT Eval Moderate Complexity: 1 Mod OT Treatments $Self Care/Home Management : 8-22 mins  Golden Circle, OTR/L Acute [employer] Pager [phone] Office [phone]    Almon Register 03/25/2021, 11:49 AM

## 2021-03-25 NOTE — Plan of Care (Signed)

## 2021-03-25 NOTE — Progress Notes (Signed)
    Durable Medical Equipment  (From admission, onward)           Start     Ordered   03/25/21 1659  For home use only DME Hospital bed  Once       Question Answer Comment  Length of Need 12 Months   Patient has (list medical condition): pericarditis with pericardial effusion   The above medical condition requires: Patient requires the ability to reposition frequently   Head must be elevated greater than: 30 degrees   Bed type Semi-electric   Support Surface: Gel Overlay      03/25/21 1659   03/25/21 1630  For home use only DME Walker rolling  Once       Question Answer Comment  Walker: With Eva   Patient needs a walker to treat with the following condition Weakness      03/25/21 1629   03/25/21 1630  For home use only DME Bedside commode  Once       Question:  Patient needs a bedside commode to treat with the following condition  Answer:  Weakness   03/25/21 1630

## 2021-03-25 NOTE — Telephone Encounter (Signed)
MR or PM please advise.   No openings with either of you until Jan.  This will be a new pt to the practice.   I can get her in with APP for HFU if needed.

## 2021-03-25 NOTE — Progress Notes (Signed)
Physical Therapy Treatment Patient Details Name: Nicole Rush MRN: [id number] DOB: 1941-02-14 Today's Date: 03/25/2021   History of Present Illness The pt is an 80 yo female presenting 12/1 with c/o chest pain. Upon work up, suspect pericarditis which is likley related to recent dx of the flu. PMH includes: asthma, breast cancer, HTN, and stroke with no residual deficits.    PT Comments    The pt continues to make slow but steady progress with OOB mobility at this time. She continues to endorse significant fatigue, but was able to progress ambulation distance to 15 ft with use of RW and minA. The pt was able to maintain SpO2 91-96% on RA at rest, but down to 88-90% on RA with mobility. Left on 1L with SpO2 95%. Pt will continue to benefit from skilled PT to progress strength, power, endurance, and stability as she and family express the pt was very active prior to this hospitalization.     Recommendations for follow up therapy are one component of a multi-disciplinary discharge planning process, led by the attending physician.  Recommendations may be updated based on patient status, additional functional criteria and insurance authorization.  Follow Up Recommendations  Home health PT     Assistance Recommended at Discharge Frequent or constant Supervision/Assistance  Equipment Recommendations  Rolling walker (2 wheels);BSC/3in1    Recommendations for Other Services       Precautions / Restrictions Precautions Precautions: Fall Precaution Comments: watch O2 Restrictions Weight Bearing Restrictions: No     Mobility  Bed Mobility Overal bed mobility: Needs Assistance Bed Mobility: Sit to Supine     Supine to sit: Min assist Sit to supine: Supervision   General bed mobility comments: no assist but significantly increased time    Transfers Overall transfer level: Needs assistance Equipment used: Rolling walker (2 wheels) Transfers: Sit to/from Stand Sit to Stand: Min  assist           General transfer comment: minA with VC for hands to power up, minA to steady once in standing position    Ambulation/Gait Ambulation/Gait assistance: Min assist Gait Distance (Feet): 15 Feet Assistive device: Rolling walker (2 wheels) Gait Pattern/deviations: Step-to pattern;Trunk flexed Gait velocity: decreased Gait velocity interpretation: <1.31 ft/sec, indicative of household ambulator   General Gait Details: pt with heavy reliance on BUE support, significant trunk flexion despite cues. minA to steady and manage RW movement, pt with little attempt to steer       Balance Overall balance assessment: Needs assistance Sitting-balance support: No upper extremity supported;Feet supported Sitting balance-Leahy Scale: Fair Sitting balance - Comments: significant trunk flexion Postural control: Posterior lean Standing balance support: Bilateral upper extremity supported;Reliant on assistive device for balance Standing balance-Leahy Scale: Poor Standing balance comment: dependent on BUE support, posterior lean                            Cognition Arousal/Alertness: Awake/alert Behavior During Therapy: Flat affect Overall Cognitive Status: Impaired/Different from baseline                                 General Comments: pt with flat affect, minimal verbal engagement even with direct questioning. pt not answering questions about goals and mobility. family present and very supportive.        Exercises      General Comments General comments (skin integrity, edema, etc.): Pt able to  maintain 91-96% on RA at rest, 88-90% on RA with mobility, returned to 1L at end of session with SpO2 95%      Pertinent Vitals/Pain Pain Assessment: No/denies pain    Home Living Family/patient expects to be discharged to:: Private residence Living Arrangements: Children;Other relatives Available Help at Discharge: Family;Available 24 hours/day  (grand-daughter) Type of Home: House Home Access: Stairs to enter Entrance Stairs-Rails: Right Entrance Stairs-Number of Steps: 5   Home Layout: One level Home Equipment: None Additional Comments: pt living with one family member (granddaughter who was present at session), will be moving to different family member's home soon where she has a flight of stairs to the bedrooms    Prior Function            PT Goals (current goals can now be found in the care plan section) Acute Rehab PT Goals Patient Stated Goal: return to walking for exercise PT Goal Formulation: With patient/family Time For Goal Achievement: 04/05/21 Potential to Achieve Goals: Good Progress towards PT goals: Progressing toward goals    Frequency    Min 3X/week      PT Plan Current plan remains appropriate       AM-PAC PT "6 Clicks" Mobility   Outcome Measure  Help needed turning from your back to your side while in a flat bed without using bedrails?: None Help needed moving from lying on your back to sitting on the side of a flat bed without using bedrails?: A Little Help needed moving to and from a bed to a chair (including a wheelchair)?: A Little Help needed standing up from a chair using your arms (e.g., wheelchair or bedside chair)?: A Little Help needed to walk in hospital room?: Total Help needed climbing 3-5 steps with a railing? : Total 6 Click Score: 15    End of Session Equipment Utilized During Treatment: Gait belt;Oxygen Activity Tolerance: Patient tolerated treatment well;Patient limited by fatigue Patient left: in bed;with call bell/phone within reach;with bed alarm set;with family/visitor present;with nursing/sitter in room Nurse Communication: Mobility status PT Visit Diagnosis: Other abnormalities of gait and mobility (R26.89);Muscle weakness (generalized) (M62.81)     Time: 4008-6761 PT Time Calculation (min) (ACUTE ONLY): 25 min  Charges:  $Gait Training: 23-37 mins                      West Carbo, PT, DPT   Acute Rehabilitation Department Pager #: [phone]   Nicole Rush 03/25/2021, 1:41 PM

## 2021-03-25 NOTE — Progress Notes (Signed)
PROGRESS NOTE    Nicole Rush  CBU:[id number] DOB: [birth year] DOA: 03/21/2021 PCP: Lin Landsman, MD   Brief Narrative:  The patient is an 80 year old African-American female with a past medical history significant for but not limited to asthma, cancer, hypertension, history of CVA as well as recent flu presented to the ED with a chief complaint of 1 to 2 weeks of chest pain.  She reports a pressure in her chest that has been intermittent and she is not sure what makes it worse but she does state that eating ice makes it better and laying flat makes it worse but she does not know if leaning forward makes it better.  She has had associated dyspnea for the last 1 to 2 weeks and has had nausea and vomiting 2 times per day.  She reports there appears to be undigested food and also reports that she has lost weight and has had palpitations.  She is not sure if her symptoms are exertional or not.  She is diagnosed with the flu on the 27th of November have been prescribed Tamiflu.  She reports a decreased appetite and no other complaints or concerns at this time but she does state that she has back and belly pain associated to the EDP.  In the ED she was noted to have a temperature 98.2 respiratory rate of 16 and 25.  I will stop blood pressure and saturations 96%.  Troponins were normal and downtrending.  She is given nitroglycerin with improvement in her symptoms but does not see a cardiologist. Chest x-ray showed poor inspiration but no active disease.  EKG showed heart rate of 95 with diffuse ST changes.  She was admitted for pericarditis associated the flu and cardiology was consulted and recommended starting the patient on colchicine.  She has now been initiated on Prednisone 20 mg po Daily and Cardiology recommending obtaining an ECHOCardiogram which has been done and trivial pericardial effusion her chest pain is improving and she is having back pain.  Oxygen has been weaned to 2 L and will further  wean.  She is improving and getting close to being discharged next 24 to 48 hours.  Assessment & Plan:   Principal Problem:   Pericarditis Active Problems:   Protein-calorie malnutrition, moderate (HCC)   Hyperlipidemia   Essential hypertension   Leukocytosis   Falls  Pericarditis with small Pericardial Effusion -Associated likely from Influenza -Continue Colchicine 0.6 mg po Daily and with Pain control with Acetaminophen 650 mg po q6hprn Mild Pain, Morphine 2 mg IV q2prn, and Oxycodone 5 mg po q4hprn; the EDP discussed with Dr. Mali Hilty and he feels like there is diffuse ST elevation and wonders if the patient has pericarditis.  He did not recommend any additional troponin testing but did recommend CT of the chest to evaluate for pericardial effusion no pericardial effusion cardiology does not need to see the patient necessarily -CT Scan showed "Small pericardial effusion with enhancement of the pericardium worrisome for pericarditis.  Mild cardiomegaly. Enlargement of the main pulmonary artery compatible with pulmonary artery hypertension.  No acute localizing process in the abdomen or pelvis. Sigmoid colon diverticulosis. 1 cm incidental thyroid nodule. No follow-up imaging is recommended. Aortic Atherosclerosis." -WBC went from 20.5 -> 22.4 and is trended down to 14.0 -CRP was 36.3 and and ESR was 108; Repeat Inflammatory markers in the AM  -IVF Hydration now to stop  -Continue to monitor on telemetry -We will discuss with Cardiology about further recommendations and we  we were going to start Ibuprofen 600 mg TID and added GI protection with po PPI Pantoprazole 40 mg daily but with her CrCl around 28 so will will start 20 mg po Prednisone Daily -Will obtain ECHOCardiogram for further evaluation and this has been done and and is as below -We will need to monitor the patient's renal function carefully given that we are starting NSAIDs; if NSAIDs worsening renal function may consider  steroids then   Influenza A -Continue Oseltamivir 30 mg po BID  -CXR this AM showed "The cardio pericardial silhouette is enlarged. There is pulmonary vascular congestion without overt pulmonary edema. Interstitial markings are diffusely coarsened with chronic features. The visualized bony structures of the thorax show no acute abnormality. Telemetry leads overlie the chest" -Initially diagnosed on November 27 -C/w Supportive Care and continue with the prednisone started for above  Acute Respiratory Failure with Hypoxia -from Above -SpO2: 91 % O2 Flow Rate (L/min): 5 L/min and was weaned to 2 L this morning -CXR this AM showed "The cardio pericardial silhouette is enlarged. Streaky opacity at the lung bases suggest atelectasis. No overt edema or dense focal airspace consolidation. The visualized bony structures of the thorax show no acute abnormality. Gaseous bowel distention noted in the visualized upper abdomen. Telemetry leads overlie the chest." -Flutter Valve, Incentive Spirometry, and Guaifenesin 1200 mg po BID -Started Xopenex/Atrovent q6h  -She was a little somnolent this morning and drowsy we will need to continue monitor respiratory status  -Continuous Pulse Oximetry and Maintain O2 Saturations >90% -Continue Supplemental O2 via [place] and wean O2 as tolerated -Repeat CXR in the AM and will need an Ambulatory Home O2 screen; O2 saturations this AM showed she went down to 88% on Ambulation   Moderate protein calorie malnutrition -Encourage nutrient dense food choices -Has had poor po Intake so will consult Nutrition and add Ensure Enlive 237 mL po BID   Hypertension -With hypotension in the ED -Continue Holding losartan and hydrochlorothiazide -Fluids as above -Continue to Monitor BP per Protocol  -Last BP was on the softer side of 111/72  Urinary Retention -Bladder Scan showed 400 mL -Strict I's and O's -If Necessary will do I and O Cath and if continues to retain may need  Foley  -C/w Bladder Scans    Thyroid Nodule -Noted to have 1 cm Nodule Incidental Thyroid Nodule -Outpatient U/S recommended    Hyperlipidemia -Continue Rosuvastatin 20 mg po Daily    Metabolic Acidosis -Mild and now improved as a CO2 is now 22, anion gap is 9, chloride level is 109 -IVF has now been discontinued -Continue to Monitor and Trend -Repeat CMP in the AM    Thrombocytosis -Mild and Likely reactive from above -Platelet Count went from 505 -> 411 -> 440 -> 451 today -Continue to Monitor and Trend -Repeat CBC in the AM   Hypophosphatemia  -Mild. Phos Level was 2.4 -Replete with po K Phos Neutral 500 mg x1 -Continue to Monitor and Trend  -Repeat Phos Level in the AM  Hyperbilirubinemia -Mild and Improved -T Bili went from 1.4 -> 1.0 -> 0.7 -> 0.4 -Continue to Monitor and Trend -Repeat CMP in the AM  Constipation -Bowel Regimen to be initiated with Miralax 17 grams po BID and Senna-Docusate 1 tab po BID   Falls -PT and OT to further evaluate and treat and they are recommendign Home Health PT with Peridot with 2 Wheels and BSC/3in1   Poor Po Intake -Consult Nutritionist for further evaluation; see  above    Microcytic Anemia -Patient's Hgb/Hct went from 10.7/32.4 -> 9.6/28.7 -> 10.2/29.6 -> 9.8/29.2 -> 9.1/26.7 -Checked Anemia Panel showed an iron level of 49, U IBC of 108, TIBC 157, saturation ratio 31%, ferritin level greater than 7500, folate level 7.8, and vitamin B12 2066 -Continue to Monitor for S/Sx of Bleeding; No overt bleeding noted -Repeat CBC in theAM   DVT prophylaxis: Heparin 5,000 units sq q8h Code Status: FULL CODE  Family Communication: No family present at bedside  Disposition Plan: Needs further clinical Improvement prior to D/C and will likely go home with Home Health in the next 24 to 48 hours if her respiratory status is improved  Status is: Inpatient  Remains inpatient appropriate because: Continues to spike temperatures and  was very agitated. Will need further clinical improvement prior to safe D/C Disposition   Consultants:  Cardiology  Procedures: CTA Chest   ECHOCARDIOGRAM IMPRESSIONS     1. Left ventricular ejection fraction, by estimation, is 65 to 70%. The  left ventricle has normal function. The left ventricle has no regional  wall motion abnormalities. Left ventricular diastolic parameters are  indeterminate. Elevated left ventricular  end-diastolic pressure.   2. Right ventricular systolic function is normal. The right ventricular  size is normal. There is normal pulmonary artery systolic pressure.   3. The mitral valve is normal in structure. Trivial mitral valve  regurgitation.   4. Tricuspid valve regurgitation is moderate.   5. The aortic valve is tricuspid. Aortic valve regurgitation is not  visualized. No aortic stenosis is present.   FINDINGS   Left Ventricle: Left ventricular ejection fraction, by estimation, is 65  to 70%. The left ventricle has normal function. The left ventricle has no  regional wall motion abnormalities. The left ventricular internal cavity  size was normal in size. There is   no left ventricular hypertrophy. Left ventricular diastolic parameters  are indeterminate. Elevated left ventricular end-diastolic pressure.   Right Ventricle: The right ventricular size is normal. Right vetricular  wall thickness was not well visualized. Right ventricular systolic  function is normal. There is normal pulmonary artery systolic pressure.  The tricuspid regurgitant velocity is 2.70  m/s, and with an assumed right atrial pressure of 3 mmHg, the estimated  right ventricular systolic pressure is 09.3 mmHg.   Left Atrium: Left atrial size was normal in size.   Right Atrium: Right atrial size was normal in size.   Pericardium: Trivial pericardial effusion is present.   Mitral Valve: The mitral valve is normal in structure. Trivial mitral  valve regurgitation.    Tricuspid Valve: The tricuspid valve is grossly normal. Tricuspid valve  regurgitation is moderate.   Aortic Valve: The aortic valve is tricuspid. Aortic valve regurgitation is  not visualized. No aortic stenosis is present.   Pulmonic Valve: The pulmonic valve was grossly normal. Pulmonic valve  regurgitation is not visualized.   Aorta: The aortic root and ascending aorta are structurally normal, with  no evidence of dilitation.   IAS/Shunts: The interatrial septum was not well visualized.      LEFT VENTRICLE  PLAX 2D  LVIDd:         3.60 cm   Diastology  LVIDs:         2.30 cm   LV e' medial:    4.58 cm/s  LV PW:         1.00 cm   LV E/e' medial:  15.6  LV IVS:  0.90 cm   LV e' lateral:   5.18 cm/s  LVOT diam:     2.10 cm   LV E/e' lateral: 13.8  LV SV:         59  LV SV Index:   43  LVOT Area:     3.46 cm      RIGHT VENTRICLE  RV S prime:     7.58 cm/s  TAPSE (M-mode): 1.4 cm   LEFT ATRIUM             Index        RIGHT ATRIUM           Index  LA diam:        3.40 cm 2.47 cm/m   RA Area:     12.80 cm  LA Vol (A2C):   45.9 ml 33.28 ml/m  RA Volume:   23.80 ml  17.26 ml/m  LA Vol (A4C):   42.8 ml 31.03 ml/m  LA Biplane Vol: 45.0 ml 32.63 ml/m   AORTIC VALVE  LVOT Vmax:   103.00 cm/s  LVOT Vmean:  67.300 cm/s  LVOT VTI:    0.170 m     AORTA  Ao Root diam: 3.00 cm  Ao Asc diam:  3.40 cm   MITRAL VALVE               TRICUSPID VALVE  MV Area (PHT): 4.71 cm    TR Peak grad:   29.2 mmHg  MV Decel Time: 161 msec    TR Vmax:        270.00 cm/s  MV E velocity: 71.60 cm/s  MV A velocity: 60.40 cm/s  SHUNTS  MV E/A ratio:  1.19        Systemic VTI:  0.17 m                             Systemic Diam: 2.10 cm   Antimicrobials:  Anti-infectives (From admission, onward)    Start     Dose/Rate Route Frequency Ordered Stop   03/22/21 1000  oseltamivir (TAMIFLU) capsule 30 mg       Note to Pharmacy: Tamiflu 30 mg BID for CrCl <  60 mL/min   30 mg Oral 2  times daily 03/22/21 0304 03/23/21 2208        Subjective: Seen and examined at bedside and she was in the chair bedside little drowsy but alert and oriented and answering questions.  Complaining of some back pain and states that her chest pain is resolved.  No lightheadedness or dizziness.  No other concerns or complaints this time and briefly desaturated on ambulation so we will continue monitor and anticipating discharging home in the next 24 to 48 hours.  Objective: Vitals:   03/25/21 0735 03/25/21 0800 03/25/21 1423 03/25/21 1520  BP: 109/77   111/72  Pulse: 78   95  Resp: (!) 22   (!) 24  Temp: 97.8 F (36.6 C)   98.5 F (36.9 C)  TempSrc:    Oral  SpO2: 93% 92% 91% 91%  Weight:      Height:        Intake/Output Summary (Last 24 hours) at 03/25/2021 1653 Last data filed at 03/25/2021 0226 Gross per 24 hour  Intake 100 ml  Output 300 ml  Net -200 ml    Filed Weights   03/23/21 1800  Weight: 43.3 kg   Examination: Physical Exam:  Constitutional:  Thin African-American female currently in no acute distress appears calm and is little somnolent and is somewhat drowsy Eyes: Lids and conjunctivae normal, sclerae anicteric  ENMT: External Ears, Nose appear normal. Grossly normal hearing. Mucous membranes are moist.  Neck: Appears normal, supple, no cervical masses, normal ROM, no appreciable thyromegaly; no appreciable JVD Respiratory: Diminished to auscultation bilaterally with coarse breath sounds, no wheezing, rales, rhonchi or crackles. Normal respiratory effort and patient is not tachypenic. No accessory muscle use.  Unlabored breathing and wearing supplemental oxygen via nasal cannula Cardiovascular: RRR, no murmurs / rubs / gallops. S1 and S2 auscultated.  No appreciable extremity edema Abdomen: Soft, non-tender, non-distended. Bowel sounds positive.  GU: Deferred. Musculoskeletal: No clubbing / cyanosis of digits/nails. No joint deformity upper and lower extremities.   Skin: No rashes, lesions, ulcers on limited skin evaluation. No induration; Warm and dry.  Neurologic: CN 2-12 grossly intact with no focal deficits but he is somnolent and drowsy. Romberg sign cerebellar reflexes not assessed.  Psychiatric: Slightly impaired judgment and insight.  She is somnolent and drowsy but is oriented. Normal mood and appropriate affect.   Data Reviewed: I have personally reviewed following labs and imaging studies  CBC: Recent Labs  Lab 03/21/21 0935 03/22/21 0554 03/23/21 0100 03/24/21 0149 03/25/21 0029  WBC 20.5* 22.4* 14.0* 13.2* 11.2*  NEUTROABS 18.1* 19.8* 12.5* 11.8* 9.6*  HGB 10.7* 9.6* 10.2* 9.8* 9.1*  HCT 32.4* 28.7* 29.6* 29.2* 26.7*  MCV 80.0 78.6* 76.7* 76.6* 75.9*  PLT 505* 411* 440* 451* 406*    Basic Metabolic Panel: Recent Labs  Lab 03/21/21 0935 03/22/21 0554 03/23/21 0100 03/24/21 0149 03/25/21 0029  NA 139 138 140 140 137  K 3.8 4.2 4.0 4.4 4.3  CL 104 107 106 109 106  CO2 21* 21* 24 22 23   GLUCOSE 129* 108* 146* 121* 153*  BUN 27* 40* 50* 44* 37*  CREATININE 0.99 1.00 1.15* 0.98 0.74  CALCIUM 9.1 8.5* 8.5* 8.3* 8.4*  MG  --  2.5* 2.8* 2.9* 2.4  PHOS  --   --  3.8 2.4* 2.7    GFR: Estimated Creatinine Clearance: 37.1 mL/min (by C-G formula based on SCr of 0.74 mg/dL). Liver Function Tests: Recent Labs  Lab 03/21/21 0935 03/22/21 0554 03/23/21 0100 03/24/21 0149 03/25/21 0029  AST 27 21 28  59* 47*  ALT 15 13 15 19 19   ALKPHOS 65 55 63 98 79  BILITOT 1.1 1.4* 1.0 0.7 0.4  PROT 7.5 6.7 6.8 6.9 6.1*  ALBUMIN 2.6* 2.2* 2.1* 2.1* 1.8*    Recent Labs  Lab 03/21/21 0935  LIPASE 38    No results for input(s): AMMONIA in the last 168 hours. Coagulation Profile: No results for input(s): INR, PROTIME in the last 168 hours. Cardiac Enzymes: No results for input(s): CKTOTAL, CKMB, CKMBINDEX, TROPONINI in the last 168 hours. BNP (last 3 results) No results for input(s): PROBNP in the last 8760 hours. HbA1C: No  results for input(s): HGBA1C in the last 72 hours. CBG: Recent Labs  Lab 03/22/21 1557  GLUCAP 109*    Lipid Profile: No results for input(s): CHOL, HDL, LDLCALC, TRIG, CHOLHDL, LDLDIRECT in the last 72 hours. Thyroid Function Tests: No results for input(s): TSH, T4TOTAL, FREET4, T3FREE, THYROIDAB in the last 72 hours. Anemia Panel: Recent Labs    03/24/21 1057  VITAMINB12 2,066*  FOLATE 7.8  FERRITIN >7,500*  TIBC 157*  IRON 49  RETICCTPCT 0.7   Sepsis Labs: No results for input(s): PROCALCITON, LATICACIDVEN in  the last 168 hours.  Recent Results (from the past 240 hour(s))  Resp Panel by RT-PCR (Flu A&B, Covid) Nasopharyngeal Swab     Status: Abnormal   Collection Time: 03/17/21  1:52 AM   Specimen: Nasopharyngeal Swab; Nasopharyngeal(NP) swabs in vial transport medium  Result Value Ref Range Status   SARS Coronavirus 2 by RT PCR NEGATIVE NEGATIVE Final    Comment: (NOTE) SARS-CoV-2 target nucleic acids are NOT DETECTED.  The SARS-CoV-2 RNA is generally detectable in upper respiratory specimens during the acute phase of infection. The lowest concentration of SARS-CoV-2 viral copies this assay can detect is 138 copies/mL. A negative result does not preclude SARS-Cov-2 infection and should not be used as the sole basis for treatment or other patient management decisions. A negative result may occur with  improper specimen collection/handling, submission of specimen other than nasopharyngeal swab, presence of viral mutation(s) within the areas targeted by this assay, and inadequate number of viral copies(<138 copies/mL). A negative result must be combined with clinical observations, patient history, and epidemiological information. The expected result is Negative.  Fact Sheet for Patients:  EntrepreneurPulse.com.au  Fact Sheet for Healthcare Providers:  IncredibleEmployment.be  This test is no t yet approved or cleared by the  [place] FDA and  has been authorized for detection and/or diagnosis of SARS-CoV-2 by FDA under an Emergency Use Authorization (EUA). This EUA will remain  in effect (meaning this test can be used) for the duration of the COVID-19 declaration under Section 564(b)(1) of the Act, 21 U.S.C.section 360bbb-3(b)(1), unless the authorization is terminated  or revoked sooner.       Influenza A by PCR POSITIVE (A) NEGATIVE Final   Influenza B by PCR NEGATIVE NEGATIVE Final    Comment: (NOTE) The Xpert Xpress SARS-CoV-2/FLU/RSV plus assay is intended as an aid in the diagnosis of influenza from Nasopharyngeal swab specimens and should not be used as a sole basis for treatment. Nasal washings and aspirates are unacceptable for Xpert Xpress SARS-CoV-2/FLU/RSV testing.  Fact Sheet for Patients: EntrepreneurPulse.com.au  Fact Sheet for Healthcare Providers: IncredibleEmployment.be  This test is not yet approved or cleared by the [place] FDA and has been authorized for detection and/or diagnosis of SARS-CoV-2 by FDA under an Emergency Use Authorization (EUA). This EUA will remain in effect (meaning this test can be used) for the duration of the COVID-19 declaration under Section 564(b)(1) of the Act, 21 U.S.C. section 360bbb-3(b)(1), unless the authorization is terminated or revoked.  Performed at [facility], [place] [address], [place], [place] [address]   Resp Panel by RT-PCR (Flu A&B, Covid) Nasopharyngeal Swab     Status: Abnormal   Collection Time: 03/22/21  9:29 AM   Specimen: Nasopharyngeal Swab; Nasopharyngeal(NP) swabs in vial transport medium  Result Value Ref Range Status   SARS Coronavirus 2 by RT PCR NEGATIVE NEGATIVE Final    Comment: (NOTE) SARS-CoV-2 target nucleic acids are NOT DETECTED.  The SARS-CoV-2 RNA is generally detectable in upper respiratory specimens during the acute phase of infection. The  lowest concentration of SARS-CoV-2 viral copies this assay can detect is 138 copies/mL. A negative result does not preclude SARS-Cov-2 infection and should not be used as the sole basis for treatment or other patient management decisions. A negative result may occur with  improper specimen collection/handling, submission of specimen other than nasopharyngeal swab, presence of viral mutation(s) within the areas targeted by this assay, and inadequate number of viral copies(<138 copies/mL). A negative result must be combined  with clinical observations, patient history, and epidemiological information. The expected result is Negative.  Fact Sheet for Patients:  EntrepreneurPulse.com.au  Fact Sheet for Healthcare Providers:  IncredibleEmployment.be  This test is no t yet approved or cleared by the [place] FDA and  has been authorized for detection and/or diagnosis of SARS-CoV-2 by FDA under an Emergency Use Authorization (EUA). This EUA will remain  in effect (meaning this test can be used) for the duration of the COVID-19 declaration under Section 564(b)(1) of the Act, 21 U.S.C.section 360bbb-3(b)(1), unless the authorization is terminated  or revoked sooner.       Influenza A by PCR POSITIVE (A) NEGATIVE Final   Influenza B by PCR NEGATIVE NEGATIVE Final    Comment: (NOTE) The Xpert Xpress SARS-CoV-2/FLU/RSV plus assay is intended as an aid in the diagnosis of influenza from Nasopharyngeal swab specimens and should not be used as a sole basis for treatment. Nasal washings and aspirates are unacceptable for Xpert Xpress SARS-CoV-2/FLU/RSV testing.  Fact Sheet for Patients: EntrepreneurPulse.com.au  Fact Sheet for Healthcare Providers: IncredibleEmployment.be  This test is not yet approved or cleared by the [place] FDA and has been authorized for detection and/or diagnosis of SARS-CoV-2 by FDA  under an Emergency Use Authorization (EUA). This EUA will remain in effect (meaning this test can be used) for the duration of the COVID-19 declaration under Section 564(b)(1) of the Act, 21 U.S.C. section 360bbb-3(b)(1), unless the authorization is terminated or revoked.  Performed at [facility], [place] [address], [place], [place] [address]      RN Pressure Injury Documentation:     Estimated body mass index is 18.04 kg/m as calculated from the following:   Height as of this encounter: 5' 1"  (1.549 m).   Weight as of this encounter: 43.3 kg.  Malnutrition Type:   Malnutrition Characteristics:   Nutrition Interventions:    Radiology Studies: DG CHEST PORT 1 VIEW  Result Date: 03/25/2021 CLINICAL DATA:  Shortness of breath EXAM: PORTABLE CHEST 1 VIEW COMPARISON:  Previous studies including the examination of 03/24/2021 FINDINGS: Transverse diameter of heart is increased. Thoracic aorta is ectatic. Increased markings are seen in the lower lung fields. There are no signs of alveolar pulmonary edema. There is no new focal pulmonary consolidation. There is no significant pleural effusion or pneumothorax. IMPRESSION: Cardiomegaly. Linear densities in the lower lung fields may suggest scarring or subsegmental atelectasis. There are no signs of alveolar pulmonary edema or new focal pulmonary consolidation. Electronically Signed   By: Elmer Picker M.D.   On: 03/25/2021 08:30   DG CHEST PORT 1 VIEW  Result Date: 03/24/2021 CLINICAL DATA:  Shortness of breath. EXAM: PORTABLE CHEST 1 VIEW COMPARISON:  03/23/2021 FINDINGS: 0545 hours. The cardio pericardial silhouette is enlarged. Streaky opacity at the lung bases suggest atelectasis. No overt edema or dense focal airspace consolidation. The visualized bony structures of the thorax show no acute abnormality. Gaseous bowel distention noted in the visualized upper abdomen. Telemetry leads overlie the chest. [place] IMPRESSION: Enlarged  cardiopericardial silhouette with streaky bibasilar atelectasis. Electronically Signed   By: Misty Stanley M.D.   On: 03/24/2021 08:18   ECHOCARDIOGRAM COMPLETE  Result Date: 03/24/2021    ECHOCARDIOGRAM REPORT   Patient Name:   KAELIE HENIGAN Date of Exam: 03/24/2021 Medical Rec #:  [id number]          Height:       61.0 in Accession #:    [id number]  Weight:       95.5 lb Date of Birth:  26-May-1940           BSA:          1.379 m Patient Age:    44 years           BP:           119/75 mmHg Patient Gender: F                  HR:           91 bpm. Exam Location:  Inpatient Procedure: 2D Echo, Cardiac Doppler and Color Doppler Indications:    Pericardial effusion I31.3  History:        Patient has no prior history of Echocardiogram examinations.                 Stroke; Risk Factors:Hypertension.  Sonographer:    Bernadene Person RDCS Referring Phys: [id number] [place]  1. Left ventricular ejection fraction, by estimation, is 65 to 70%. The left ventricle has normal function. The left ventricle has no regional wall motion abnormalities. Left ventricular diastolic parameters are indeterminate. Elevated left ventricular end-diastolic pressure.  2. Right ventricular systolic function is normal. The right ventricular size is normal. There is normal pulmonary artery systolic pressure.  3. The mitral valve is normal in structure. Trivial mitral valve regurgitation.  4. Tricuspid valve regurgitation is moderate.  5. The aortic valve is tricuspid. Aortic valve regurgitation is not visualized. No aortic stenosis is present. FINDINGS  Left Ventricle: Left ventricular ejection fraction, by estimation, is 65 to 70%. The left ventricle has normal function. The left ventricle has no regional wall motion abnormalities. The left ventricular internal cavity size was normal in size. There is  no left ventricular hypertrophy. Left ventricular diastolic parameters are indeterminate. Elevated left ventricular  end-diastolic pressure. Right Ventricle: The right ventricular size is normal. Right vetricular wall thickness was not well visualized. Right ventricular systolic function is normal. There is normal pulmonary artery systolic pressure. The tricuspid regurgitant velocity is 2.70 m/s, and with an assumed right atrial pressure of 3 mmHg, the estimated right ventricular systolic pressure is 76.1 mmHg. Left Atrium: Left atrial size was normal in size. Right Atrium: Right atrial size was normal in size. Pericardium: Trivial pericardial effusion is present. Mitral Valve: The mitral valve is normal in structure. Trivial mitral valve regurgitation. Tricuspid Valve: The tricuspid valve is grossly normal. Tricuspid valve regurgitation is moderate. Aortic Valve: The aortic valve is tricuspid. Aortic valve regurgitation is not visualized. No aortic stenosis is present. Pulmonic Valve: The pulmonic valve was grossly normal. Pulmonic valve regurgitation is not visualized. Aorta: The aortic root and ascending aorta are structurally normal, with no evidence of dilitation. IAS/Shunts: The interatrial septum was not well visualized.  LEFT VENTRICLE PLAX 2D LVIDd:         3.60 cm   Diastology LVIDs:         2.30 cm   LV e' medial:    4.58 cm/s LV PW:         1.00 cm   LV E/e' medial:  15.6 LV IVS:        0.90 cm   LV e' lateral:   5.18 cm/s LVOT diam:     2.10 cm   LV E/e' lateral: 13.8 LV SV:         59 LV SV Index:   43 LVOT Area:  3.46 cm  RIGHT VENTRICLE RV S prime:     7.58 cm/s TAPSE (M-mode): 1.4 cm LEFT ATRIUM             Index        RIGHT ATRIUM           Index LA diam:        3.40 cm 2.47 cm/m   RA Area:     12.80 cm LA Vol (A2C):   45.9 ml 33.28 ml/m  RA Volume:   23.80 ml  17.26 ml/m LA Vol (A4C):   42.8 ml 31.03 ml/m LA Biplane Vol: 45.0 ml 32.63 ml/m  AORTIC VALVE LVOT Vmax:   103.00 cm/s LVOT Vmean:  67.300 cm/s LVOT VTI:    0.170 m  AORTA Ao Root diam: 3.00 cm Ao Asc diam:  3.40 cm MITRAL VALVE                TRICUSPID VALVE MV Area (PHT): 4.71 cm    TR Peak grad:   29.2 mmHg MV Decel Time: 161 msec    TR Vmax:        270.00 cm/s MV E velocity: 71.60 cm/s MV A velocity: 60.40 cm/s  SHUNTS MV E/A ratio:  1.19        Systemic VTI:  0.17 m                            Systemic Diam: 2.10 cm Mertie Moores MD Electronically signed by Mertie Moores MD Signature Date/Time: 03/24/2021/12:29:49 PM    Final     Scheduled Meds:  colchicine  0.6 mg Oral Daily   feeding supplement  237 mL Oral BID BM   guaiFENesin  1,200 mg Oral BID   heparin  5,000 Units Subcutaneous Q8H   ipratropium  0.5 mg Nebulization TID   levalbuterol  0.63 mg Nebulization TID   loratadine  10 mg Oral Daily   pantoprazole  40 mg Oral Daily   polyethylene glycol  17 g Oral BID   predniSONE  20 mg Oral Q breakfast   rosuvastatin  20 mg Oral Daily   senna-docusate  1 tablet Oral BID   Continuous Infusions:  sodium chloride      LOS: 4 days   Kerney Elbe, DO [facility] PAGER is on AMION  If 7PM-7AM, please contact night-coverage [email or web address]

## 2021-03-25 NOTE — Plan of Care (Signed)
°  Problem: Clinical Measurements: °Goal: Ability to maintain clinical measurements within normal limits will improve °Outcome: Progressing °Goal: Diagnostic test results will improve °Outcome: Progressing °Goal: Cardiovascular complication will be avoided °Outcome: Progressing °  °

## 2021-03-26 ENCOUNTER — Inpatient Hospital Stay (HOSPITAL_COMMUNITY): Payer: Medicare HMO

## 2021-03-26 DIAGNOSIS — R509 Fever, unspecified: Secondary | ICD-10-CM

## 2021-03-26 LAB — URINALYSIS, MICROSCOPIC (REFLEX)

## 2021-03-26 LAB — URINALYSIS, ROUTINE W REFLEX MICROSCOPIC
Bilirubin Urine: NEGATIVE
Glucose, UA: NEGATIVE mg/dL
Ketones, ur: NEGATIVE mg/dL
Leukocytes,Ua: NEGATIVE
Nitrite: NEGATIVE
Protein, ur: 30 mg/dL — AB
Specific Gravity, Urine: 1.025 (ref 1.005–1.030)
pH: 6 (ref 5.0–8.0)

## 2021-03-26 LAB — CBC WITH DIFFERENTIAL/PLATELET
Abs Immature Granulocytes: 0 10*3/uL (ref 0.00–0.07)
Basophils Absolute: 0 10*3/uL (ref 0.0–0.1)
Basophils Relative: 0 %
Eosinophils Absolute: 0 10*3/uL (ref 0.0–0.5)
Eosinophils Relative: 0 %
HCT: 35.1 % — ABNORMAL LOW (ref 36.0–46.0)
Hemoglobin: 12.1 g/dL (ref 12.0–15.0)
Lymphocytes Relative: 11 %
Lymphs Abs: 1 10*3/uL (ref 0.7–4.0)
MCH: 25.7 pg — ABNORMAL LOW (ref 26.0–34.0)
MCHC: 34.5 g/dL (ref 30.0–36.0)
MCV: 74.5 fL — ABNORMAL LOW (ref 80.0–100.0)
Monocytes Absolute: 0.4 10*3/uL (ref 0.1–1.0)
Monocytes Relative: 4 %
Neutro Abs: 7.9 10*3/uL — ABNORMAL HIGH (ref 1.7–7.7)
Neutrophils Relative %: 85 %
Platelets: 408 10*3/uL — ABNORMAL HIGH (ref 150–400)
RBC: 4.71 MIL/uL (ref 3.87–5.11)
RDW: 14.1 % (ref 11.5–15.5)
WBC: 9.3 10*3/uL (ref 4.0–10.5)
nRBC: 0 /100 WBC
nRBC: 0.5 % — ABNORMAL HIGH (ref 0.0–0.2)

## 2021-03-26 LAB — COMPREHENSIVE METABOLIC PANEL
ALT: 41 U/L (ref 0–44)
AST: 106 U/L — ABNORMAL HIGH (ref 15–41)
Albumin: 2.1 g/dL — ABNORMAL LOW (ref 3.5–5.0)
Alkaline Phosphatase: 96 U/L (ref 38–126)
Anion gap: 12 (ref 5–15)
BUN: 34 mg/dL — ABNORMAL HIGH (ref 8–23)
CO2: 22 mmol/L (ref 22–32)
Calcium: 8.6 mg/dL — ABNORMAL LOW (ref 8.9–10.3)
Chloride: 100 mmol/L (ref 98–111)
Creatinine, Ser: 0.92 mg/dL (ref 0.44–1.00)
GFR, Estimated: >60 mL/min (ref >60)
Glucose, Bld: 88 mg/dL (ref 70–99)
Potassium: 4.4 mmol/L (ref 3.5–5.1)
Sodium: 134 mmol/L — ABNORMAL LOW (ref 135–145)
Total Bilirubin: 0.7 mg/dL (ref 0.3–1.2)
Total Protein: 6.9 g/dL (ref 6.5–8.1)

## 2021-03-26 LAB — PHOSPHORUS: Phosphorus: 1.9 mg/dL — ABNORMAL LOW (ref 2.5–4.6)

## 2021-03-26 LAB — MAGNESIUM: Magnesium: 2 mg/dL (ref 1.7–2.4)

## 2021-03-26 MED ORDER — SODIUM PHOSPHATES 45 MMOLE/15ML IV SOLN
30.0000 mmol | Freq: Once | INTRAVENOUS | Status: AC
  Administered 2021-03-26: 30 mmol via INTRAVENOUS
  Filled 2021-03-26: qty 10

## 2021-03-26 MED ORDER — SODIUM CHLORIDE 0.9 % IV BOLUS
500.0000 mL | Freq: Once | INTRAVENOUS | Status: AC
  Administered 2021-03-26: 500 mL via INTRAVENOUS

## 2021-03-26 MED ORDER — SODIUM CHLORIDE 0.9 % IV SOLN: INTRAVENOUS | Status: DC

## 2021-03-26 MED ORDER — IPRATROPIUM BROMIDE 0.02 % IN SOLN
0.5000 mg | Freq: Two times a day (BID) | RESPIRATORY_TRACT | Status: DC
  Administered 2021-03-26 – 2021-03-27 (×3): 0.5 mg via RESPIRATORY_TRACT
  Filled 2021-03-26 (×2): qty 2.5

## 2021-03-26 MED ORDER — IBUPROFEN 200 MG PO TABS
400.0000 mg | ORAL_TABLET | Freq: Once | ORAL | Status: AC
  Administered 2021-03-26: 400 mg via ORAL
  Filled 2021-03-26: qty 2

## 2021-03-26 MED ORDER — LEVALBUTEROL HCL 0.63 MG/3ML IN NEBU
0.6300 mg | INHALATION_SOLUTION | Freq: Two times a day (BID) | RESPIRATORY_TRACT | Status: DC
  Administered 2021-03-26 – 2021-03-27 (×3): 0.63 mg via RESPIRATORY_TRACT
  Filled 2021-03-26 (×2): qty 3

## 2021-03-26 NOTE — Plan of Care (Signed)
  Problem: Clinical Measurements: Goal: Respiratory complications will improve Outcome: Progressing Goal: Cardiovascular complication will be avoided Outcome: Progressing   Problem: Nutrition: Goal: Adequate nutrition will be maintained Outcome: Progressing   Problem: Coping: Goal: Level of anxiety will decrease Outcome: Progressing

## 2021-03-26 NOTE — Telephone Encounter (Signed)
I have scheduled Patient 04/05/21 at 3:30pm in a 30 min slot for hospital follow up, with Dr. Vaughan Browner. I have placed ILD packet in out going mail for Patient to bring to [place].

## 2021-03-26 NOTE — Progress Notes (Signed)
PROGRESS NOTE    Nicole Rush  GYI:[id number] DOB: Jan 29, 1941 DOA: 03/21/2021 PCP: Lin Landsman, MD   Brief Narrative:  The patient is an 80 year old African-American female with a past medical history significant for but not limited to asthma, cancer, hypertension, history of CVA as well as recent flu presented to the ED with a chief complaint of 1 to 2 weeks of chest pain.  She reports a pressure in her chest that has been intermittent and she is not sure what makes it worse but she does state that eating ice makes it better and laying flat makes it worse but she does not know if leaning forward makes it better.  She has had associated dyspnea for the last 1 to 2 weeks and has had nausea and vomiting 2 times per day.  She reports there appears to be undigested food and also reports that she has lost weight and has had palpitations.  She is not sure if her symptoms are exertional or not.  She is diagnosed with the flu on the 27th of November have been prescribed Tamiflu.  She reports a decreased appetite and no other complaints or concerns at this time but she does state that she has back and belly pain associated to the EDP.  In the ED she was noted to have a temperature 98.2 respiratory rate of 16 and 25.  I will stop blood pressure and saturations 96%.  Troponins were normal and downtrending.  She is given nitroglycerin with improvement in her symptoms but does not see a cardiologist. Chest x-ray showed poor inspiration but no active disease.  EKG showed heart rate of 95 with diffuse ST changes.  She was admitted for pericarditis associated the flu and cardiology was consulted and recommended starting the patient on colchicine.  She has now been initiated on Prednisone 20 mg po Daily and Cardiology recommending obtaining an ECHOCardiogram which has been done and trivial pericardial effusion her chest pain is improving and she is having back pain.  Oxygen has been weaned to 2 L and will further  wean.  She is improving and getting close to being discharged however she spiked a temperature of 102 this morning and did not feel as well.  We will panculture again and further evaluate her temperature and give her a 500 mL bolus and start normal saline at 75 MLS per hour.  Assessment & Plan:   Principal Problem:   Pericarditis Active Problems:   Protein-calorie malnutrition, moderate (HCC)   Hyperlipidemia   Essential hypertension   Leukocytosis   Falls  Pericarditis with small Pericardial Effusion -Associated likely from Influenza -Continue Colchicine 0.6 mg po Daily and with Pain control with Acetaminophen 650 mg po q6hprn Mild Pain, Morphine 2 mg IV q2prn, and Oxycodone 5 mg po q4hprn; the EDP discussed with Dr. Mali Hilty and he feels like there is diffuse ST elevation and wonders if the patient has pericarditis.  He did not recommend any additional troponin testing but did recommend CT of the chest to evaluate for pericardial effusion no pericardial effusion cardiology does not need to see the patient necessarily -CT Scan showed "Small pericardial effusion with enhancement of the pericardium worrisome for pericarditis.  Mild cardiomegaly. Enlargement of the main pulmonary artery compatible with pulmonary artery hypertension.  No acute localizing process in the abdomen or pelvis. Sigmoid colon diverticulosis. 1 cm incidental thyroid nodule. No follow-up imaging is recommended. Aortic Atherosclerosis." -WBC went from 20.5 -> 22.4 and is trended down to 14.0 -  CRP was 36.3 and and ESR was 108; Repeat Inflammatory markers in the AM  -IVF Hydration now to stop  -Continue to monitor on telemetry -We will discuss with Cardiology about further recommendations and we we were going to start Ibuprofen 600 mg TID and added GI protection with po PPI Pantoprazole 40 mg daily but with her CrCl around 28 so will will start 20 mg po Prednisone Daily -Will obtain ECHOCardiogram for further evaluation and  this has been done and and is as below -We will need to monitor the patient's renal function carefully given that we are starting NSAIDs; if NSAIDs worsening renal function may consider steroids then -PT/OT recommending home health family may not be able to supervise 24/7 so she may need SNF placement   Influenza A -Continue Oseltamivir 30 mg po BID  -CXR this AM showed "The cardio pericardial silhouette is enlarged. There is pulmonary vascular congestion without overt pulmonary edema. Interstitial markings are diffusely coarsened with chronic features. The visualized bony structures of the thorax show no acute abnormality. Telemetry leads overlie the chest" -Initially diagnosed on November 27 -He was elevated at 22.4 and has now normalized to 9.3 -C/w Supportive Care and continue with the prednisone started for above  Hypophosphatemia -Patient's Phos Level was 1.9 -Replete with IV Na Phos 30 mmol -Continue to Monitor and Replete as Necessary -Repeat CMP in the AM   HypoNatremia -Mild. Na+ went from 140 -> 137 -> 134 -Replete with IV Na Phos 30 mmol -Continue to Monitor and Trend -Repeat CMP in the AM   Fever -In the setting of above -Likely in the setting of influenza A but we will panculture again and evaluate make sure she is not having secondary infection -We will give 500 mL bolus and start maintenance IVF -We will give acetaminophen for the fever and give one-time dose of ibuprofen -We will panculture again obtain blood cultures x2 urinalysis and urine culture as well as a repeat chest x-ray -Chest x-ray this morning showed -Continue monitor vital signs and trend WBC curve WBC is now normalized  Acute Respiratory Failure with Hypoxia, improving -from Above -SpO2: 91 % O2 Flow Rate (L/min): 3 L/min and was weaned to 2 L yesterday -CXR this AM showed "The cardio pericardial silhouette is enlarged. Streaky opacity at the lung bases suggest atelectasis. No overt edema or dense  focal airspace consolidation. The visualized bony structures of the thorax show no acute abnormality. Gaseous bowel distention noted in the visualized upper abdomen. Telemetry leads overlie the chest." -Flutter Valve, Incentive Spirometry, and Guaifenesin 1200 mg po BID -Started Xopenex/Atrovent q6h  -She was a little somnolent this morning and drowsy we will need to continue monitor respiratory status  -Continuous Pulse Oximetry and Maintain O2 Saturations >90% -Continue Supplemental O2 via [place] and wean O2 as tolerated -Repeat CXR in the AM and will need an Ambulatory Home O2 screen; O2 saturations this AM showed she went down to 88% on Ambulation -She will need a pulmonary outpatient follow-up for suspected ILD   Moderate protein calorie malnutrition -Encourage nutrient dense food choices -Has had poor po Intake so will consult Nutrition and add Ensure Enlive 237 mL po BID   Hypertension -With hypotension in the ED -Continue Holding losartan and hydrochlorothiazide -Fluids as above -Continue to Monitor BP per Protocol  -Last BP was on the softer side of 111/72  Urinary Retention -Bladder Scan showed 400 mL -Strict I's and O's -If Necessary will do I and O Cath and if  continues to retain may need Foley  -C/w Bladder Scans    Thyroid Nodule -Noted to have 1 cm Nodule Incidental Thyroid Nodule -Outpatient U/S recommended    Hyperlipidemia -Continue Rosuvastatin 20 mg po Daily    Metabolic Acidosis -Mild and now improved as a CO2 is now 22, anion gap is 12, chloride level is 100 -IVF has now been renewed  -Continue to Monitor and Trend -Repeat CMP in the AM    Thrombocytosis -Mild and Likely reactive from above -Platelet Count went from 505 -> 411 -> 440 -> 451 -> 406 -> 408 -Continue to Monitor and Trend -Repeat CBC in the AM   Abnormal AST -AST is trending up and went from 21 -> 28 -> 59 -> 47 -> 106 -Mild and likely reactive -Continue to Monitor and Trend and if  Necessary will obtain a RUQ U/S an Acute Hepatitis Panel -May need to hold Statin -Continue to Monitor and Trend -Repeat CMP in the AM   Hyperbilirubinemia -Mild and Improved -T Bili went from 1.4 -> 1.0 -> 0.7 -> 0.4 -> 0.7 -Continue to Monitor and Trend -Repeat CMP in the AM  Constipation -Bowel Regimen to be initiated with Miralax 17 grams po BID and Senna-Docusate 1 tab po BID   Falls -PT and OT to further evaluate and treat and they are recommendign Home Health PT with [facility] with 2 Wheels and BSC/3in1   Poor Po Intake -Consult Nutritionist for further evaluation; see above    Microcytic Anemia -Patient's Hgb/Hct went from 10.7/32.4 -> 9.6/28.7 -> 10.2/29.6 -> 9.8/29.2 -> 9.1/26.7 -> 12.1/35.1 and ? Hemoconcentration  -Checked Anemia Panel showed an iron level of 49, U IBC of 108, TIBC 157, saturation ratio 31%, ferritin level greater than 7500, folate level 7.8, and vitamin B12 2066 -Continue to Monitor for S/Sx of Bleeding; No overt bleeding noted -Repeat CBC in theAM   DVT prophylaxis: Heparin 5,000 units sq q8h Code Status: FULL CODE  Family Communication: Discussed with family at bedside and over the phone with Daughter [name] Disposition Plan: Needs further clinical Improvement prior to D/C and PT/OT recommending [place] but may need SNF  Status is: Inpatient  Remains inpatient appropriate because: Continues to spike temperatures and was very agitated. Will need further clinical improvement prior to safe D/C Disposition   Consultants:  Cardiology  Procedures: CTA Chest   ECHOCARDIOGRAM IMPRESSIONS     1. Left ventricular ejection fraction, by estimation, is 65 to 70%. The  left ventricle has normal function. The left ventricle has no regional  wall motion abnormalities. Left ventricular diastolic parameters are  indeterminate. Elevated left ventricular  end-diastolic pressure.   2. Right ventricular systolic function is normal. The right  ventricular  size is normal. There is normal pulmonary artery systolic pressure.   3. The mitral valve is normal in structure. Trivial mitral valve  regurgitation.   4. Tricuspid valve regurgitation is moderate.   5. The aortic valve is tricuspid. Aortic valve regurgitation is not  visualized. No aortic stenosis is present.   FINDINGS   Left Ventricle: Left ventricular ejection fraction, by estimation, is 65  to 70%. The left ventricle has normal function. The left ventricle has no  regional wall motion abnormalities. The left ventricular internal cavity  size was normal in size. There is   no left ventricular hypertrophy. Left ventricular diastolic parameters  are indeterminate. Elevated left ventricular end-diastolic pressure.   Right Ventricle: The right ventricular size is normal. Right vetricular  wall  thickness was not well visualized. Right ventricular systolic  function is normal. There is normal pulmonary artery systolic pressure.  The tricuspid regurgitant velocity is 2.70  m/s, and with an assumed right atrial pressure of 3 mmHg, the estimated  right ventricular systolic pressure is 97.3 mmHg.   Left Atrium: Left atrial size was normal in size.   Right Atrium: Right atrial size was normal in size.   Pericardium: Trivial pericardial effusion is present.   Mitral Valve: The mitral valve is normal in structure. Trivial mitral  valve regurgitation.   Tricuspid Valve: The tricuspid valve is grossly normal. Tricuspid valve  regurgitation is moderate.   Aortic Valve: The aortic valve is tricuspid. Aortic valve regurgitation is  not visualized. No aortic stenosis is present.   Pulmonic Valve: The pulmonic valve was grossly normal. Pulmonic valve  regurgitation is not visualized.   Aorta: The aortic root and ascending aorta are structurally normal, with  no evidence of dilitation.   IAS/Shunts: The interatrial septum was not well visualized.      LEFT VENTRICLE  PLAX  2D  LVIDd:         3.60 cm   Diastology  LVIDs:         2.30 cm   LV e' medial:    4.58 cm/s  LV PW:         1.00 cm   LV E/e' medial:  15.6  LV IVS:        0.90 cm   LV e' lateral:   5.18 cm/s  LVOT diam:     2.10 cm   LV E/e' lateral: 13.8  LV SV:         59  LV SV Index:   43  LVOT Area:     3.46 cm      RIGHT VENTRICLE  RV S prime:     7.58 cm/s  TAPSE (M-mode): 1.4 cm   LEFT ATRIUM             Index        RIGHT ATRIUM           Index  LA diam:        3.40 cm 2.47 cm/m   RA Area:     12.80 cm  LA Vol (A2C):   45.9 ml 33.28 ml/m  RA Volume:   23.80 ml  17.26 ml/m  LA Vol (A4C):   42.8 ml 31.03 ml/m  LA Biplane Vol: 45.0 ml 32.63 ml/m   AORTIC VALVE  LVOT Vmax:   103.00 cm/s  LVOT Vmean:  67.300 cm/s  LVOT VTI:    0.170 m     AORTA  Ao Root diam: 3.00 cm  Ao Asc diam:  3.40 cm   MITRAL VALVE               TRICUSPID VALVE  MV Area (PHT): 4.71 cm    TR Peak grad:   29.2 mmHg  MV Decel Time: 161 msec    TR Vmax:        270.00 cm/s  MV E velocity: 71.60 cm/s  MV A velocity: 60.40 cm/s  SHUNTS  MV E/A ratio:  1.19        Systemic VTI:  0.17 m                             Systemic Diam: 2.10 cm   Antimicrobials:  Anti-infectives (From admission, onward)  Start     Dose/Rate Route Frequency Ordered Stop   03/22/21 1000  oseltamivir (TAMIFLU) capsule 30 mg       Note to Pharmacy: Tamiflu 30 mg BID for CrCl <  60 mL/min   30 mg Oral 2 times daily 03/22/21 0304 03/23/21 2208        Subjective: Seen and examined at bedside and denies similar chest pain but does not feel as well.  Had a temperature of 102 this morning.  Denies any lightheadedness or dizziness.  Had some nausea this morning.  No other concerns or complaints at this time.  Objective: Vitals:   03/26/21 0007 03/26/21 0445 03/26/21 0738 03/26/21 0750  BP: 128/77 (!) 161/81  130/69  Pulse: 87 (!) 103  (!) 117  Resp: 20 (!) 26  20  Temp: 99.4 F (37.4 C) (!) 102.8 F (39.3 C)  (!) 102.5 F (39.2  C)  TempSrc: Oral Oral  Oral  SpO2: 98% 93% 91% 91%  Weight:      Height:       No intake or output data in the 24 hours ending 03/26/21 1542  Filed Weights   03/23/21 1800  Weight: 43.3 kg   Examination: Physical Exam:  Constitutional: Thin African-American female currently in no acute distress appears a little somnolent and drowsy and feeling a little bit nauseous Eyes: Lids and conjunctivae normal, sclerae anicteric  ENMT: External Ears, Nose appear normal. Grossly normal hearing.  Neck: Appears normal, supple, no cervical masses, normal ROM, no appreciable thyromegaly, no appreciable JVD Respiratory: Diminished to auscultation bilaterally with coarse breath sounds, no wheezing, rales, rhonchi or crackles. Normal respiratory effort and patient is not tachypenic. No accessory muscle use.  Unlabored breathing Cardiovascular: Mildly tachycardic but in sinus rhythm, no murmurs / rubs / gallops. S1 and S2 auscultated. No extremity edema.  Abdomen: Soft, non-tender, non-distended. No masses palpated. No appreciable hepatosplenomegaly. Bowel sounds positive.  GU: Deferred. Musculoskeletal: No clubbing / cyanosis of digits/nails. No joint deformity upper and lower extremities. Skin: No rashes, lesions, ulcers on to skin evaluation. No induration; Warm and dry.  Neurologic: CN 2-12 grossly intact with no focal deficits.  Romberg sign and cerebellar reflexes not assessed.  Psychiatric: Impaired judgment and insight. Alert and oriented x 2. Normal mood and appropriate affect.   Data Reviewed: I have personally reviewed following labs and imaging studies  CBC: Recent Labs  Lab 03/22/21 0554 03/23/21 0100 03/24/21 0149 03/25/21 0029 03/26/21 0303  WBC 22.4* 14.0* 13.2* 11.2* 9.3  NEUTROABS 19.8* 12.5* 11.8* 9.6* 7.9*  HGB 9.6* 10.2* 9.8* 9.1* 12.1  HCT 28.7* 29.6* 29.2* 26.7* 35.1*  MCV 78.6* 76.7* 76.6* 75.9* 74.5*  PLT 411* 440* 451* 406* 408*    Basic Metabolic Panel: Recent  Labs  Lab 03/22/21 0554 03/23/21 0100 03/24/21 0149 03/25/21 0029 03/26/21 0303  NA 138 140 140 137 134*  K 4.2 4.0 4.4 4.3 4.4  CL 107 106 109 106 100  CO2 21* _0 GLUCOSE 108* 146* 121* 153* 88  BUN 40* 50* 44* 37* 34*  CREATININE 1.00 1.15* 0.98 0.74 0.92  CALCIUM 8.5* 8.5* 8.3* 8.4* 8.6*  MG 2.5* 2.8* 2.9* 2.4 2.0  PHOS  --  3.8 2.4* 2.7 1.9*   GFR: Estimated Creatinine Clearance: 32.2 mL/min (by C-G formula based on SCr of 0.92 mg/dL). Liver Function Tests: Recent Labs  Lab 03/22/21 0554 03/23/21 0100 03/24/21 0149 03/25/21 0029 03/26/21 0303  AST 21 28 59* 47* 106*  ALT _0 41  ALKPHOS 55 63 98 79 96  BILITOT 1.4* 1.0 0.7 0.4 0.7  PROT 6.7 6.8 6.9 6.1* 6.9  ALBUMIN 2.2* 2.1* 2.1* 1.8* 2.1*    Recent Labs  Lab 03/21/21 0935  LIPASE 38    No results for input(s): AMMONIA in the last 168 hours. Coagulation Profile: No results for input(s): INR, PROTIME in the last 168 hours. Cardiac Enzymes: No results for input(s): CKTOTAL, CKMB, CKMBINDEX, TROPONINI in the last 168 hours. BNP (last 3 results) No results for input(s): PROBNP in the last 8760 hours. HbA1C: No results for input(s): HGBA1C in the last 72 hours. CBG: Recent Labs  Lab 03/22/21 1557  GLUCAP 109*    Lipid Profile: No results for input(s): CHOL, HDL, LDLCALC, TRIG, CHOLHDL, LDLDIRECT in the last 72 hours. Thyroid Function Tests: No results for input(s): TSH, T4TOTAL, FREET4, T3FREE, THYROIDAB in the last 72 hours. Anemia Panel: Recent Labs    03/24/21 1057  VITAMINB12 2,066*  FOLATE 7.8  FERRITIN >7,500*  TIBC 157*  IRON 49  RETICCTPCT 0.7   Sepsis Labs: No results for input(s): PROCALCITON, LATICACIDVEN in the last 168 hours.  Recent Results (from the past 240 hour(s))  Resp Panel by RT-PCR (Flu A&B, Covid) Nasopharyngeal Swab     Status: Abnormal   Collection Time: 03/17/21  1:52 AM   Specimen: Nasopharyngeal Swab; Nasopharyngeal(NP) swabs in vial transport  medium  Result Value Ref Range Status   SARS Coronavirus 2 by RT PCR NEGATIVE NEGATIVE Final    Comment: (NOTE) SARS-CoV-2 target nucleic acids are NOT DETECTED.  The SARS-CoV-2 RNA is generally detectable in upper respiratory specimens during the acute phase of infection. The lowest concentration of SARS-CoV-2 viral copies this assay can detect is 138 copies/mL. A negative result does not preclude SARS-Cov-2 infection and should not be used as the sole basis for treatment or other patient management decisions. A negative result may occur with  improper specimen collection/handling, submission of specimen other than nasopharyngeal swab, presence of viral mutation(s) within the areas targeted by this assay, and inadequate number of viral copies(<138 copies/mL). A negative result must be combined with clinical observations, patient history, and epidemiological information. The expected result is Negative.  Fact Sheet for Patients:  EntrepreneurPulse.com.au  Fact Sheet for Healthcare Providers:  IncredibleEmployment.be  This test is no t yet approved or cleared by the [place] FDA and  has been authorized for detection and/or diagnosis of SARS-CoV-2 by FDA under an Emergency Use Authorization (EUA). This EUA will remain  in effect (meaning this test can be used) for the duration of the COVID-19 declaration under Section 564(b)(1) of the Act, 21 U.S.C.section 360bbb-3(b)(1), unless the authorization is terminated  or revoked sooner.       Influenza A by PCR POSITIVE (A) NEGATIVE Final   Influenza B by PCR NEGATIVE NEGATIVE Final    Comment: (NOTE) The Xpert Xpress SARS-CoV-2/FLU/RSV plus assay is intended as an aid in the diagnosis of influenza from Nasopharyngeal swab specimens and should not be used as a sole basis for treatment. Nasal washings and aspirates are unacceptable for Xpert Xpress SARS-CoV-2/FLU/RSV testing.  Fact Sheet for  Patients: EntrepreneurPulse.com.au  Fact Sheet for Healthcare Providers: IncredibleEmployment.be  This test is not yet approved or cleared by the [place] FDA and has been authorized for detection and/or diagnosis of SARS-CoV-2 by FDA under an Emergency Use Authorization (EUA). This EUA will remain in effect (meaning this test can be used) for the  duration of the COVID-19 declaration under Section 564(b)(1) of the Act, 21 U.S.C. section 360bbb-3(b)(1), unless the authorization is terminated or revoked.  Performed at [facility], [place] [address] [place], [place] [address]   Resp Panel by RT-PCR (Flu A&B, Covid) Nasopharyngeal Swab     Status: Abnormal   Collection Time: 03/22/21  9:29 AM   Specimen: Nasopharyngeal Swab; Nasopharyngeal(NP) swabs in vial transport medium  Result Value Ref Range Status   SARS Coronavirus 2 by RT PCR NEGATIVE NEGATIVE Final    Comment: (NOTE) SARS-CoV-2 target nucleic acids are NOT DETECTED.  The SARS-CoV-2 RNA is generally detectable in upper respiratory specimens during the acute phase of infection. The lowest concentration of SARS-CoV-2 viral copies this assay can detect is 138 copies/mL. A negative result does not preclude SARS-Cov-2 infection and should not be used as the sole basis for treatment or other patient management decisions. A negative result may occur with  improper specimen collection/handling, submission of specimen other than nasopharyngeal swab, presence of viral mutation(s) within the areas targeted by this assay, and inadequate number of viral copies(<138 copies/mL). A negative result must be combined with clinical observations, patient history, and epidemiological information. The expected result is Negative.  Fact Sheet for Patients:  EntrepreneurPulse.com.au  Fact Sheet for Healthcare Providers:  IncredibleEmployment.be  This  test is no t yet approved or cleared by the [place] FDA and  has been authorized for detection and/or diagnosis of SARS-CoV-2 by FDA under an Emergency Use Authorization (EUA). This EUA will remain  in effect (meaning this test can be used) for the duration of the COVID-19 declaration under Section 564(b)(1) of the Act, 21 U.S.C.section 360bbb-3(b)(1), unless the authorization is terminated  or revoked sooner.       Influenza A by PCR POSITIVE (A) NEGATIVE Final   Influenza B by PCR NEGATIVE NEGATIVE Final    Comment: (NOTE) The Xpert Xpress SARS-CoV-2/FLU/RSV plus assay is intended as an aid in the diagnosis of influenza from Nasopharyngeal swab specimens and should not be used as a sole basis for treatment. Nasal washings and aspirates are unacceptable for Xpert Xpress SARS-CoV-2/FLU/RSV testing.  Fact Sheet for Patients: EntrepreneurPulse.com.au  Fact Sheet for Healthcare Providers: IncredibleEmployment.be  This test is not yet approved or cleared by the [place] FDA and has been authorized for detection and/or diagnosis of SARS-CoV-2 by FDA under an Emergency Use Authorization (EUA). This EUA will remain in effect (meaning this test can be used) for the duration of the COVID-19 declaration under Section 564(b)(1) of the Act, 21 U.S.C. section 360bbb-3(b)(1), unless the authorization is terminated or revoked.  Performed at [facility], [place] [address], [place], [place] [address]     RN Pressure Injury Documentation:     Estimated body mass index is 18.04 kg/m as calculated from the following:   Height as of this encounter: 5' 1" (1.549 m).   Weight as of this encounter: 43.3 kg.  Malnutrition Type:   Malnutrition Characteristics:   Nutrition Interventions:    Radiology Studies: DG CHEST PORT 1 VIEW  Result Date: 03/26/2021 CLINICAL DATA:  Shortness of breath.  History of pericarditis. EXAM: PORTABLE CHEST 1  VIEW COMPARISON:  03/25/2021 FINDINGS: Enlarged cardiac silhouette as seen previously. Tortuous aorta. Lungs are clear. The vascularity is normal. No effusions. 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 IMPRESSION: No change. Enlarged cardiac silhouette. Tortuous aorta. Lungs clear. Electronically Signed   By: Nelson Chimes M.D.   On: 03/26/2021 08:40  DG CHEST PORT 1 VIEW  Result Date: 03/25/2021 CLINICAL DATA:  Shortness of breath EXAM: PORTABLE CHEST 1 VIEW COMPARISON:  Previous studies including the examination of 03/24/2021 FINDINGS: Transverse diameter of heart is increased. Thoracic aorta is ectatic. Increased markings are seen in the lower lung fields. There are no signs of alveolar pulmonary edema. There is no new focal pulmonary consolidation. There is no significant pleural effusion or pneumothorax. IMPRESSION: Cardiomegaly. Linear densities in the lower lung fields may suggest scarring or subsegmental atelectasis. There are no signs of alveolar pulmonary edema or new focal pulmonary consolidation. Electronically Signed   By: Elmer Picker M.D.   On: 03/25/2021 08:30    Scheduled Meds:  colchicine  0.6 mg Oral Daily   feeding supplement  237 mL Oral BID BM   guaiFENesin  1,200 mg Oral BID   heparin  5,000 Units Subcutaneous Q8H   ipratropium  0.5 mg Nebulization BID   levalbuterol  0.63 mg Nebulization BID   loratadine  10 mg Oral Daily   pantoprazole  40 mg Oral Daily   polyethylene glycol  17 g Oral BID   predniSONE  20 mg Oral Q breakfast   rosuvastatin  20 mg Oral Daily   senna-docusate  1 tablet Oral BID   Continuous Infusions:  sodium chloride 75 mL/hr at 03/26/21 1030   sodium chloride     sodium phosphate  Dextrose 5% IVPB 30 mmol (03/26/21 1035)    LOS: 5 days   Kerney Elbe, DO [facility] PAGER is on AMION  If 7PM-7AM, please contact night-coverage [email or web address]

## 2021-03-26 NOTE — Progress Notes (Signed)
   03/26/21 0445  Assess: MEWS Score  Temp (!) 102.8 F (39.3 C)  BP (!) 161/81  Pulse Rate (!) 103  ECG Heart Rate (!) 106  Resp (!) 26  Level of Consciousness Alert  SpO2 93 %  O2 Device Nasal Cannula  O2 Flow Rate (L/min) 2 L/min  Assess: MEWS Score  MEWS Temp 2  MEWS Systolic 0  MEWS Pulse 1  MEWS RR 2  MEWS LOC 0  MEWS Score 5  MEWS Score Color Red  Assess: if the MEWS score is Yellow or Red  Were vital signs taken at a resting state? Yes  Focused Assessment No change from prior assessment  Early Detection of Sepsis Score *See Row Information* Low  MEWS guidelines implemented *See Row Information* No, previously red, continue vital signs every 4 hours  Treat  MEWS Interventions Administered scheduled meds/treatments  Pain Scale 0-10  Pain Score 0  Escalate  MEWS: Escalate Red: discuss with charge nurse/RN and provider, consider discussing with RRT  Notify: Charge Nurse/RN  Name of Charge Nurse/RN Notified Shiwangi  Date Charge Nurse/RN Notified 03/26/21  Time Charge Nurse/RN Notified 0454  Notify: Provider  Provider Name/Title Dr. Ulyses Southward  Date Provider Notified 03/26/21  Time Provider Notified [phone]  Notification Type Page  Notification Reason Change in status;Other (Comment) (temp elevated)  Provider response No new orders  Date of Provider Response 03/26/21  Time of Provider Response 0502  Notify: Rapid Response  Name of Rapid Response RN Notified Waunita Schooner  Date Rapid Response Notified 03/26/21  Time Rapid Response Notified [id number]  Document  Patient Outcome Stabilized after interventions  Progress note created (see row info) Yes

## 2021-03-26 NOTE — Telephone Encounter (Signed)
If unable to get with Dr Vaughan Browner at time given by him please let me know

## 2021-03-27 ENCOUNTER — Inpatient Hospital Stay (HOSPITAL_COMMUNITY): Payer: Medicare HMO

## 2021-03-27 DIAGNOSIS — R5381 Other malaise: Secondary | ICD-10-CM

## 2021-03-27 DIAGNOSIS — J101 Influenza due to other identified influenza virus with other respiratory manifestations: Secondary | ICD-10-CM

## 2021-03-27 DIAGNOSIS — D72824 Basophilia: Secondary | ICD-10-CM

## 2021-03-27 DIAGNOSIS — J8410 Pulmonary fibrosis, unspecified: Secondary | ICD-10-CM

## 2021-03-27 DIAGNOSIS — E43 Unspecified severe protein-calorie malnutrition: Secondary | ICD-10-CM

## 2021-03-27 LAB — CK: Total CK: 92 U/L (ref 38–234)

## 2021-03-27 LAB — CBC WITH DIFFERENTIAL/PLATELET
Abs Immature Granulocytes: 0.08 10*3/uL — ABNORMAL HIGH (ref 0.00–0.07)
Basophils Absolute: 0 10*3/uL (ref 0.0–0.1)
Basophils Relative: 0 %
Eosinophils Absolute: 0.1 10*3/uL (ref 0.0–0.5)
Eosinophils Relative: 1 %
HCT: 28.3 % — ABNORMAL LOW (ref 36.0–46.0)
Hemoglobin: 9.8 g/dL — ABNORMAL LOW (ref 12.0–15.0)
Immature Granulocytes: 1 %
Lymphocytes Relative: 10 %
Lymphs Abs: 1.5 10*3/uL (ref 0.7–4.0)
MCH: 25.7 pg — ABNORMAL LOW (ref 26.0–34.0)
MCHC: 34.6 g/dL (ref 30.0–36.0)
MCV: 74.1 fL — ABNORMAL LOW (ref 80.0–100.0)
Monocytes Absolute: 0.3 10*3/uL (ref 0.1–1.0)
Monocytes Relative: 2 %
Neutro Abs: 13.2 10*3/uL — ABNORMAL HIGH (ref 1.7–7.7)
Neutrophils Relative %: 86 %
Platelets: 331 10*3/uL (ref 150–400)
RBC: 3.82 MIL/uL — ABNORMAL LOW (ref 3.87–5.11)
RDW: 14 % (ref 11.5–15.5)
WBC: 15.3 10*3/uL — ABNORMAL HIGH (ref 4.0–10.5)
nRBC: 0 % (ref 0.0–0.2)

## 2021-03-27 LAB — COMPREHENSIVE METABOLIC PANEL
ALT: 55 U/L — ABNORMAL HIGH (ref 0–44)
AST: 111 U/L — ABNORMAL HIGH (ref 15–41)
Albumin: 1.8 g/dL — ABNORMAL LOW (ref 3.5–5.0)
Alkaline Phosphatase: 80 U/L (ref 38–126)
Anion gap: 11 (ref 5–15)
BUN: 24 mg/dL — ABNORMAL HIGH (ref 8–23)
CO2: 23 mmol/L (ref 22–32)
Calcium: 7.7 mg/dL — ABNORMAL LOW (ref 8.9–10.3)
Chloride: 103 mmol/L (ref 98–111)
Creatinine, Ser: 0.66 mg/dL (ref 0.44–1.00)
GFR, Estimated: >60 mL/min (ref >60)
Glucose, Bld: 85 mg/dL (ref 70–99)
Potassium: 3.3 mmol/L — ABNORMAL LOW (ref 3.5–5.1)
Sodium: 137 mmol/L (ref 135–145)
Total Bilirubin: 0.8 mg/dL (ref 0.3–1.2)
Total Protein: 5.8 g/dL — ABNORMAL LOW (ref 6.5–8.1)

## 2021-03-27 LAB — PHOSPHORUS: Phosphorus: 2.8 mg/dL (ref 2.5–4.6)

## 2021-03-27 LAB — MAGNESIUM: Magnesium: 1.7 mg/dL (ref 1.7–2.4)

## 2021-03-27 MED ORDER — LEVALBUTEROL HCL 0.63 MG/3ML IN NEBU: 0.6300 mg | INHALATION_SOLUTION | Freq: Four times a day (QID) | RESPIRATORY_TRACT | Status: DC | PRN

## 2021-03-27 MED ORDER — MAGNESIUM SULFATE 2 GM/50ML IV SOLN
2.0000 g | Freq: Once | INTRAVENOUS | Status: AC
  Administered 2021-03-27: 2 g via INTRAVENOUS
  Filled 2021-03-27: qty 50

## 2021-03-27 MED ORDER — IPRATROPIUM BROMIDE 0.02 % IN SOLN: 0.5000 mg | Freq: Four times a day (QID) | RESPIRATORY_TRACT | Status: DC | PRN

## 2021-03-27 MED ORDER — POTASSIUM CHLORIDE CRYS ER 20 MEQ PO TBCR
40.0000 meq | EXTENDED_RELEASE_TABLET | ORAL | Status: AC
  Administered 2021-03-27 (×2): 40 meq via ORAL
  Filled 2021-03-27 (×2): qty 2

## 2021-03-27 NOTE — Progress Notes (Addendum)
PROGRESS NOTE  Marguerite Barba DVV:[id number] DOB: [birth year]   PCP: Lin Landsman, MD  Patient is from: Home  DOA: 03/21/2021 LOS: 6  Chief complaints:  Chief Complaint  Patient presents with   Chest Pain     Brief Narrative / Interim history: 80 year old F with PMH of asthma, cancer, HTN, CVA and recent flu infection presenting with 1 to 2 weeks of chest pain with associated dyspnea, nausea and vomiting, and admitted with pericarditis with trivial pericardial effusion and 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  She was febrile with significant leukocytosis.  CXR with poor inspiration but no active disease.  EKG with diffuse ST changes.  Cardiology consulted.  TTE with LVEF of 65 to 70%, indeterminate DD, moderate TVR and trivial pericardial effusion.  Patient was started on prednisone and colchicine.  Spiked another fever the morning of 12/6.  Blood cultures NGTD.  Urine culture pending.  Subjective: Seen and examined this afternoon.  No major events overnight.  She had some chest pain earlier this morning that has resolved.  Currently no complaints.  She denies chest pain, dyspnea, GI or UTI symptoms.  Objective: Vitals:   03/27/21 0349 03/27/21 0857 03/27/21 1140 03/27/21 1324  BP: 120/71  128/79   Pulse: 80  99 99  Resp: 17  (!) 22 20  Temp: 97.8 F (36.6 C) 98.8 F (37.1 C) 100 F (37.8 C) 99.6 F (37.6 C)  TempSrc: Oral Oral  Oral  SpO2: 99%   94%  Weight:      Height:        Intake/Output Summary (Last 24 hours) at 03/27/2021 1408 Last data filed at 03/27/2021 0444 Gross per 24 hour  Intake 743.19 ml  Output 775 ml  Net -31.81 ml   Filed Weights   03/23/21 1800  Weight: 43.3 kg    Examination:  GENERAL: No apparent distress.  Nontoxic. HEENT: MMM.  Vision and hearing grossly intact.  NECK: Supple.  No apparent JVD.  RESP: 92% on RA at rest. No IWOB.  Fair aeration bilaterally. CVS:  RRR. Heart sounds normal.  ABD/GI/GU: BS+. Abd soft, NTND.  MSK/EXT:  Moves extremities. No  apparent deformity. No edema.  SKIN: no apparent skin lesion or wound NEURO: Awake, alert and oriented appropriately.  No apparent focal neuro deficit. PSYCH: Calm. Normal affect.   Procedures:  None  Microbiology summarized: 03/22/2021-Influenza A positive 03/22/2021-COVID-19 PCR negative. 03/26/2021-blood cultures NGTD. 03/26/2021-urine culture pending.  Assessment & Plan: Pericarditis with small Pericardial Effusion-felt to be postviral from influenza A infection. TTE as above.  CRP 36.  ESR 108.  CT chest/abdomen/pelvis raises concern for pericarditis.  Significant leukocytosis but improved. -Continue colchicine and prednisone -Check uric acid-might be late since she has already been on colchicine.   Influenza A: Initially diagnosed on 11/27. -Continue Oseltamivir 30 mg po BID although she was initially diagnosed on 11/27 -Supportive care with incentive telemetry, OOB, mucolytic's and antitussive  Fever: spiked another fever to 102.8 the morning of 12/6.  Infectious work-up including blood culture, CXR, urinalysis and blood culture unrevealing.  Urine culture pending. Mild temp to 99.5F this afternoon. -Continue monitoring -Follow urine culture  Acute respiratory failure with hypoxia: Likely from influenza A and possible atelectasis. -Already on prednisone for pericarditis. -Continue Xopenex and Atrovent every 6 hours -Mucolytic's and antitussive, incentive spirometry, OOB/PT/OT. -Wean oxygen as able.  Minimum to keep saturation above 88%. -PCCM recommended outpatient follow-up for calcified granuloma in left lung.   Hyponatremia/hypophosphatemia/hypokalemia/hypomagnesemia: -P.o. KCl 40x2 for hypokalemia. -IV magnesium sulfate 2  g x 1  Elevated liver enzymes: Pattern suggests rhabdo or EtOH. Recent Labs  Lab 03/23/21 0100 03/24/21 0149 03/25/21 0029 03/26/21 0303 03/27/21 0544  AST 28 59* 47* 106* 111*  ALT 15 19 19  41 55*  ALKPHOS 63 98 79 96 80  BILITOT 1.0 0.7 0.4  0.7 0.8  PROT 6.8 6.9 6.1* 6.9 5.8*  ALBUMIN 2.1* 2.1* 1.8* 2.1* 1.8*  -Check CK -Continue monitoring -Hold Crestor.  Essential hypertension: Normotensive. -Continue holding home losartan and HCTZ   Moderate protein calorie malnutrition -Encourage nutrient dense food choices -Has had poor po Intake so will consult Nutrition and add Ensure Enlive 237 mL po BID  Microcytic anemia: H&H relatively stable.  Anemia panel consistent with anemia of chronic disease. Recent Labs    03/21/21 0935 03/22/21 0554 03/23/21 0100 03/24/21 0149 03/25/21 0029 03/26/21 0303 03/27/21 0544  HGB 10.7* 9.6* 10.2* 9.8* 9.1* 12.1 9.8*  -Monitor intermittently  Urinary Retention? -Closely monitor urine output -Intermittent bladder scan as needed   Metabolic Acidosis: Resolved.   Thrombocytosis: Resolved  Hyperbilirubinemia: Resolved.   Constipation -Bowel regimen  Generalized weakness/physical deconditioning  -PT/OT   Severe malnutrition: As evidenced by low BMI for age and poor p.o. intake. Body mass index is 18.04 kg/m.  -Consult dietitian       DVT prophylaxis:  heparin injection 5,000 Units Start: 03/22/21 1400 SCDs Start: 03/22/21 0304  Code Status: Full code Family Communication: Updated patient's daughter, [name] over the phone. Level of care: Telemetry Medical Status is: Inpatient  Remains inpatient appropriate because: fever, pericarditis and safe disposition       Consultants:  Cardiology   Sch Meds:  Scheduled Meds:  colchicine  0.6 mg Oral Daily   feeding supplement  237 mL Oral BID BM   guaiFENesin  1,200 mg Oral BID   heparin  5,000 Units Subcutaneous Q8H   ipratropium  0.5 mg Nebulization BID   levalbuterol  0.63 mg Nebulization BID   loratadine  10 mg Oral Daily   pantoprazole  40 mg Oral Daily   polyethylene glycol  17 g Oral BID   predniSONE  20 mg Oral Q breakfast   senna-docusate  1 tablet Oral BID   Continuous Infusions: PRN  Meds:.acetaminophen **OR** acetaminophen, hydrOXYzine, morphine injection, ondansetron **OR** ondansetron (ZOFRAN) IV, oxyCODONE  Antimicrobials: Anti-infectives (From admission, onward)    Start     Dose/Rate Route Frequency Ordered Stop   03/22/21 1000  oseltamivir (TAMIFLU) capsule 30 mg       Note to Pharmacy: Tamiflu 30 mg BID for CrCl <  60 mL/min   30 mg Oral 2 times daily 03/22/21 0304 03/23/21 2208        I have personally reviewed the following labs and images: CBC: Recent Labs  Lab 03/23/21 0100 03/24/21 0149 03/25/21 0029 03/26/21 0303 03/27/21 0544  WBC 14.0* 13.2* 11.2* 9.3 15.3*  NEUTROABS 12.5* 11.8* 9.6* 7.9* 13.2*  HGB 10.2* 9.8* 9.1* 12.1 9.8*  HCT 29.6* 29.2* 26.7* 35.1* 28.3*  MCV 76.7* 76.6* 75.9* 74.5* 74.1*  PLT 440* 451* 406* 408* 331   BMP &GFR Recent Labs  Lab 03/23/21 0100 03/24/21 0149 03/25/21 0029 03/26/21 0303 03/27/21 0544  NA 140 140 137 134* 137  K 4.0 4.4 4.3 4.4 3.3*  CL 106 109 106 100 103  CO2 24 22 23 22 23   GLUCOSE 146* 121* 153* 88 85  BUN 50* 44* 37* 34* 24*  CREATININE 1.15* 0.98 0.74 0.92 0.66  CALCIUM 8.5* 8.3*  8.4* 8.6* 7.7*  MG 2.8* 2.9* 2.4 2.0 1.7  PHOS 3.8 2.4* 2.7 1.9* 2.8   Estimated Creatinine Clearance: 37.1 mL/min (by C-G formula based on SCr of 0.66 mg/dL). Liver & Pancreas: Recent Labs  Lab 03/23/21 0100 03/24/21 0149 03/25/21 0029 03/26/21 0303 03/27/21 0544  AST 28 59* 47* 106* 111*  ALT 15 19 19  41 55*  ALKPHOS 63 98 79 96 80  BILITOT 1.0 0.7 0.4 0.7 0.8  PROT 6.8 6.9 6.1* 6.9 5.8*  ALBUMIN 2.1* 2.1* 1.8* 2.1* 1.8*   Recent Labs  Lab 03/21/21 0935  LIPASE 38   No results for input(s): AMMONIA in the last 168 hours. Diabetic: No results for input(s): HGBA1C in the last 72 hours. Recent Labs  Lab 03/22/21 1557  GLUCAP 109*   Cardiac Enzymes: No results for input(s): CKTOTAL, CKMB, CKMBINDEX, TROPONINI in the last 168 hours. No results for input(s): PROBNP in the last 8760  hours. Coagulation Profile: No results for input(s): INR, PROTIME in the last 168 hours. Thyroid Function Tests: No results for input(s): TSH, T4TOTAL, FREET4, T3FREE, THYROIDAB in the last 72 hours. Lipid Profile: No results for input(s): CHOL, HDL, LDLCALC, TRIG, CHOLHDL, LDLDIRECT in the last 72 hours. Anemia Panel: No results for input(s): VITAMINB12, FOLATE, FERRITIN, TIBC, IRON, RETICCTPCT in the last 72 hours. Urine analysis:    Component Value Date/Time   COLORURINE ORANGE (A) 03/26/2021 0816   APPEARANCEUR CLEAR 03/26/2021 0816   LABSPEC 1.025 03/26/2021 0816   PHURINE 6.0 03/26/2021 0816   GLUCOSEU NEGATIVE 03/26/2021 0816   HGBUR SMALL (A) 03/26/2021 0816   BILIRUBINUR NEGATIVE 03/26/2021 0816   KETONESUR NEGATIVE 03/26/2021 0816   PROTEINUR 30 (A) 03/26/2021 0816   NITRITE NEGATIVE 03/26/2021 0816   LEUKOCYTESUR NEGATIVE 03/26/2021 0816   Sepsis Labs: Invalid input(s): PROCALCITONIN, [place]  Microbiology: Recent Results (from the past 240 hour(s))  Resp Panel by RT-PCR (Flu A&B, Covid) Nasopharyngeal Swab     Status: Abnormal   Collection Time: 03/22/21  9:29 AM   Specimen: Nasopharyngeal Swab; Nasopharyngeal(NP) swabs in vial transport medium  Result Value Ref Range Status   SARS Coronavirus 2 by RT PCR NEGATIVE NEGATIVE Final    Comment: (NOTE) SARS-CoV-2 target nucleic acids are NOT DETECTED.  The SARS-CoV-2 RNA is generally detectable in upper respiratory specimens during the acute phase of infection. The lowest concentration of SARS-CoV-2 viral copies this assay can detect is 138 copies/mL. A negative result does not preclude SARS-Cov-2 infection and should not be used as the sole basis for treatment or other patient management decisions. A negative result may occur with  improper specimen collection/handling, submission of specimen other than nasopharyngeal swab, presence of viral mutation(s) within the areas targeted by this assay, and inadequate  number of viral copies(<138 copies/mL). A negative result must be combined with clinical observations, patient history, and epidemiological information. The expected result is Negative.  Fact Sheet for Patients:  EntrepreneurPulse.com.au  Fact Sheet for Healthcare Providers:  IncredibleEmployment.be  This test is no t yet approved or cleared by the [place] FDA and  has been authorized for detection and/or diagnosis of SARS-CoV-2 by FDA under an Emergency Use Authorization (EUA). This EUA will remain  in effect (meaning this test can be used) for the duration of the COVID-19 declaration under Section 564(b)(1) of the Act, 21 U.S.C.section 360bbb-3(b)(1), unless the authorization is terminated  or revoked sooner.       Influenza A by PCR POSITIVE (A) NEGATIVE Final   Influenza B  by PCR NEGATIVE NEGATIVE Final    Comment: (NOTE) The Xpert Xpress SARS-CoV-2/FLU/RSV plus assay is intended as an aid in the diagnosis of influenza from Nasopharyngeal swab specimens and should not be used as a sole basis for treatment. Nasal washings and aspirates are unacceptable for Xpert Xpress SARS-CoV-2/FLU/RSV testing.  Fact Sheet for Patients: EntrepreneurPulse.com.au  Fact Sheet for Healthcare Providers: IncredibleEmployment.be  This test is not yet approved or cleared by the [place] FDA and has been authorized for detection and/or diagnosis of SARS-CoV-2 by FDA under an Emergency Use Authorization (EUA). This EUA will remain in effect (meaning this test can be used) for the duration of the COVID-19 declaration under Section 564(b)(1) of the Act, 21 U.S.C. section 360bbb-3(b)(1), unless the authorization is terminated or revoked.  Performed at [facility], [place] [address] [place], [place] [address]   Culture, blood (routine x 2)     Status: None (Preliminary result)   Collection Time: 03/26/21  9:27  AM   Specimen: BLOOD RIGHT HAND  Result Value Ref Range Status   Specimen Description BLOOD RIGHT HAND  Final   Special Requests   Final    AEROBIC BOTTLE ONLY Blood Culture results may not be optimal due to an inadequate volume of blood received in culture bottles   Culture   Final    NO GROWTH < 24 HOURS Performed at [facility], [place] [address], [place], [place] [address]    Report Status PENDING  Incomplete  Culture, blood (routine x 2)     Status: None (Preliminary result)   Collection Time: 03/26/21 10:33 AM   Specimen: BLOOD RIGHT HAND  Result Value Ref Range Status   Specimen Description BLOOD RIGHT HAND  Final   Special Requests   Final    AEROBIC BOTTLE ONLY Blood Culture results may not be optimal due to an inadequate volume of blood received in culture bottles   Culture   Final    NO GROWTH < 24 HOURS Performed at [facility], [place] [address], [place], [place] [address]    Report Status PENDING  Incomplete    Radiology Studies: DG CHEST PORT 1 VIEW  Result Date: 03/27/2021 CLINICAL DATA:  Shortness of breath EXAM: PORTABLE CHEST 1 VIEW COMPARISON:  Previous studies including the examination of 03/26/2021 FINDINGS: Transverse diameter of heart is increased. There are no signs of alveolar pulmonary edema or new focal infiltrates. Small linear densities in the right parahilar region may suggest scarring or subsegmental atelectasis. There is minimal blunting left lateral CP angle. There is no pneumothorax. Surgical clips are seen in the left axilla. IMPRESSION: Cardiomegaly. There are no signs of pulmonary edema or new focal infiltrates. Electronically Signed   By: Elmer Picker M.D.   On: 03/27/2021 09:07       Claretha Townshend [address] [place]  If 7PM-7AM, please contact night-coverage [email or web address] 03/27/2021, 2:08 PM

## 2021-03-27 NOTE — Progress Notes (Signed)
Physical Therapy Treatment Patient Details Name: Nicole Rush MRN: [id number] DOB: 06-22-1940 Today's Date: 03/27/2021   History of Present Illness The pt is an 80 yo female presenting 12/1 with c/o chest pain. Upon work up, suspect pericarditis which is likley related to recent dx of the flu. PMH includes: asthma, breast cancer, HTN, and stroke with no residual deficits.    PT Comments    Patient progressing with ambulation distance despite up in chair and to bathroom earlier per RN.  Patient seems ambivalent about rehab versus home, but speaking with family pt would not have help at times all day as daughter states she sometimes works 8am to 11pm.  She states niece lives in [place] with a third floor apartment so hopeful for rehab.  Patient will need assist for safety so updated recommendations to STSNF level rehab prior to home.  PT will continue to follow.    Recommendations for follow up therapy are one component of a multi-disciplinary discharge planning process, led by the attending physician.  Recommendations may be updated based on patient status, additional functional criteria and insurance authorization.  Follow Up Recommendations  Skilled nursing-short term rehab (<3 hours/day)     Assistance Recommended at Discharge    Equipment Recommendations  Rolling walker (2 wheels);BSC/3in1    Recommendations for Other Services       Precautions / Restrictions Precautions Precautions: Fall Precaution Comments: watch O2     Mobility  Bed Mobility Overal bed mobility: Needs Assistance Bed Mobility: Supine to Sit     Supine to sit: Min assist Sit to supine: Supervision   General bed mobility comments: asking for help to lift trunk with hand hold assist    Transfers Overall transfer level: Needs assistance Equipment used: Rolling walker (2 wheels) Transfers: Sit to/from Stand Sit to Stand: Min assist           General transfer comment: assist for balance,  safety    Ambulation/Gait Ambulation/Gait assistance: Min assist Gait Distance (Feet): 120 Feet Assistive device: Rolling walker (2 wheels) Gait Pattern/deviations: Step-through pattern;Trunk flexed;Decreased stride length       General Gait Details: cues to stay in walker and assist for turns, patient states feels she does need the walker   Stairs             Wheelchair Mobility    Modified Rankin (Stroke Patients Only)       Balance Overall balance assessment: Needs assistance   Sitting balance-Leahy Scale: Fair     Standing balance support: Bilateral upper extremity supported;Reliant on assistive device for balance Standing balance-Leahy Scale: Poor                              Cognition Arousal/Alertness: Awake/alert Behavior During Therapy: Flat affect Overall Cognitive Status: No family/caregiver present to determine baseline cognitive functioning Area of Impairment: Problem solving;Following commands;Memory                     Memory: Decreased short-term memory Following Commands: Follows one step commands consistently;Follows one step commands with increased time     Problem Solving: Slow processing;Decreased initiation;Requires verbal cues;Requires tactile cues General Comments: patient ambivalent about rehab versus home, but at times indicating family can help; did not want to converse about family history as she is from [facility] with same last name.  Said all she knew about was a bunch of chicken farms.  needed frequent  cues for walker management        Exercises      General Comments General comments (skin integrity, edema, etc.): SpO2 94% on RA HR 74; spoke by phone with daughter, [name] who reports pt would be alone much of the day at her home and neice who could be home lives in [place] in apartment on third floor.  Hopes she can go to rehab.      Pertinent Vitals/Pain Pain Assessment: No/denies pain    Home Living                           Prior Function            PT Goals (current goals can now be found in the care plan section) Progress towards PT goals: Progressing toward goals    Frequency    Min 2X/week      PT Plan Discharge plan needs to be updated;Frequency needs to be updated    Co-evaluation              AM-PAC PT "6 Clicks" Mobility   Outcome Measure  Help needed turning from your back to your side while in a flat bed without using bedrails?: A Little Help needed moving from lying on your back to sitting on the side of a flat bed without using bedrails?: A Little Help needed moving to and from a bed to a chair (including a wheelchair)?: A Little Help needed standing up from a chair using your arms (e.g., wheelchair or bedside chair)?: A Little Help needed to walk in hospital room?: A Little Help needed climbing 3-5 steps with a railing? : Total 6 Click Score: 16    End of Session Equipment Utilized During Treatment: Gait belt Activity Tolerance: Patient limited by fatigue Patient left: in bed;with call bell/phone within reach;with bed alarm set   PT Visit Diagnosis: Other abnormalities of gait and mobility (R26.89);Muscle weakness (generalized) (M62.81)     Time: 9767-3419 PT Time Calculation (min) (ACUTE ONLY): 23 min  Charges:  $Gait Training: 8-22 mins $Therapeutic Activity: 8-22 mins                     Nicole Rush, PT Acute Rehabilitation Services Pager:[phone] Office:[phone] 03/27/2021    Nicole Rush 03/27/2021, 3:45 PM

## 2021-03-27 NOTE — Plan of Care (Signed)
  Problem: Education: Goal: Knowledge of General Education information will improve Description: Including pain rating scale, medication(s)/side effects and non-pharmacologic comfort measures Outcome: Progressing   Problem: Clinical Measurements: Goal: Diagnostic test results will improve Outcome: Progressing Goal: Respiratory complications will improve Outcome: Progressing Goal: Cardiovascular complication will be avoided Outcome: Progressing   Problem: Activity: Goal: Risk for activity intolerance will decrease Outcome: Progressing   Problem: Nutrition: Goal: Adequate nutrition will be maintained Outcome: Progressing   

## 2021-03-28 LAB — CBC WITH DIFFERENTIAL/PLATELET
Abs Immature Granulocytes: 0.11 10*3/uL — ABNORMAL HIGH (ref 0.00–0.07)
Basophils Absolute: 0 10*3/uL (ref 0.0–0.1)
Basophils Relative: 0 %
Eosinophils Absolute: 0.1 10*3/uL (ref 0.0–0.5)
Eosinophils Relative: 1 %
HCT: 29.6 % — ABNORMAL LOW (ref 36.0–46.0)
Hemoglobin: 10.1 g/dL — ABNORMAL LOW (ref 12.0–15.0)
Immature Granulocytes: 1 %
Lymphocytes Relative: 11 %
Lymphs Abs: 1.6 10*3/uL (ref 0.7–4.0)
MCH: 25.4 pg — ABNORMAL LOW (ref 26.0–34.0)
MCHC: 34.1 g/dL (ref 30.0–36.0)
MCV: 74.4 fL — ABNORMAL LOW (ref 80.0–100.0)
Monocytes Absolute: 0.5 10*3/uL (ref 0.1–1.0)
Monocytes Relative: 3 %
Neutro Abs: 12.6 10*3/uL — ABNORMAL HIGH (ref 1.7–7.7)
Neutrophils Relative %: 84 %
Platelets: 288 10*3/uL (ref 150–400)
RBC: 3.98 MIL/uL (ref 3.87–5.11)
RDW: 14.2 % (ref 11.5–15.5)
WBC: 15 10*3/uL — ABNORMAL HIGH (ref 4.0–10.5)
nRBC: 0.2 % (ref 0.0–0.2)

## 2021-03-28 LAB — SEDIMENTATION RATE: Sed Rate: 70 mm/hr — ABNORMAL HIGH (ref 0–22)

## 2021-03-28 LAB — URINE CULTURE

## 2021-03-28 LAB — RENAL FUNCTION PANEL
Albumin: 1.8 g/dL — ABNORMAL LOW (ref 3.5–5.0)
Anion gap: 9 (ref 5–15)
BUN: 21 mg/dL (ref 8–23)
CO2: 22 mmol/L (ref 22–32)
Calcium: 8.2 mg/dL — ABNORMAL LOW (ref 8.9–10.3)
Chloride: 105 mmol/L (ref 98–111)
Creatinine, Ser: 0.7 mg/dL (ref 0.44–1.00)
GFR, Estimated: >60 mL/min (ref >60)
Glucose, Bld: 87 mg/dL (ref 70–99)
Phosphorus: 2.1 mg/dL — ABNORMAL LOW (ref 2.5–4.6)
Potassium: 4.3 mmol/L (ref 3.5–5.1)
Sodium: 136 mmol/L (ref 135–145)

## 2021-03-28 LAB — HEPATIC FUNCTION PANEL
ALT: 62 U/L — ABNORMAL HIGH (ref 0–44)
AST: 96 U/L — ABNORMAL HIGH (ref 15–41)
Albumin: 1.8 g/dL — ABNORMAL LOW (ref 3.5–5.0)
Alkaline Phosphatase: 78 U/L (ref 38–126)
Bilirubin, Direct: 0.2 mg/dL (ref 0.0–0.2)
Indirect Bilirubin: 0.7 mg/dL (ref 0.3–0.9)
Total Bilirubin: 0.9 mg/dL (ref 0.3–1.2)
Total Protein: 6 g/dL — ABNORMAL LOW (ref 6.5–8.1)

## 2021-03-28 LAB — C-REACTIVE PROTEIN: CRP: 10.6 mg/dL — ABNORMAL HIGH (ref <1.0)

## 2021-03-28 LAB — MAGNESIUM: Magnesium: 2.1 mg/dL (ref 1.7–2.4)

## 2021-03-28 NOTE — Progress Notes (Signed)
Occupational Therapy Treatment Patient Details Name: Nicole Rush MRN: [id number] DOB: 04-03-1941 Today's Date: 03/28/2021   History of present illness The pt is an 80 yo female presenting 12/1 with c/o chest pain. Upon work up, suspect pericarditis which is likley related to recent dx of the flu. PMH includes: asthma, breast cancer, HTN, and stroke with no residual deficits.   OT comments  Pt noted with limitations due to cognition and fatigue this AM. Pt required consistent cues for initiation, problem solving and sequencing of basic ADLs with vague answers throughout regarding PLOF. Pt overall Min A for basic transfers with RW and benefits from hands on assist to complete LB ADLs, such as toileting during session. When asked about DC plan, pt reports "It's up to my children". If family able to provide consistent hands on assist, would be ok to DC home with [facility] services. However, if family working long hours as indicated in [employer] session yesterday, would recommend SNF rehab to maximize safety and independence prior to return home.    Recommendations for follow up therapy are one component of a multi-disciplinary discharge planning process, led by the attending physician.  Recommendations may be updated based on patient status, additional functional criteria and insurance authorization.    Follow Up Recommendations  Skilled nursing-short term rehab (<3 hours/day)    Assistance Recommended at Discharge Frequent or constant Supervision/Assistance  Equipment Recommendations  BSC/3in1    Recommendations for Other Services      Precautions / Restrictions Precautions Precautions: Fall Restrictions Weight Bearing Restrictions: No       Mobility Bed Mobility Overal bed mobility: Needs Assistance Bed Mobility: Supine to Sit;Sit to Supine     Supine to sit: Supervision;HOB elevated Sit to supine: Min guard   General bed mobility comments: no assist to sit up, min guard to guide B LE  back into bed    Transfers Overall transfer level: Needs assistance Equipment used: Rolling walker (2 wheels);None Transfers: Sit to/from Stand;Bed to chair/wheelchair/BSC Sit to Stand: Min guard   Step pivot transfers: Min assist       General transfer comment: no assist needed to power up but but max cues for sequencing (scooting forwad, pushing up). Cues for DME use and balance in stepping to/from [facility]     Balance Overall balance assessment: Needs assistance Sitting-balance support: No upper extremity supported;Feet supported Sitting balance-Leahy Scale: Fair     Standing balance support: Bilateral upper extremity supported;Reliant on assistive device for balance Standing balance-Leahy Scale: Poor Standing balance comment: reliant on at least one UE support                           ADL either performed or assessed with clinical judgement   ADL Overall ADL's : Needs assistance/impaired     Grooming: Supervision/safety;Sitting;Oral care Grooming Details (indicate cue type and reason): cues for initiation, problem solving. Pt requesting "rinse" but required multiple cues to take cup of water from OT to complete task. Initially wanted to walk to sink for task but declined due to fatigue/upper back pain                 Toilet Transfer: Minimal assistance;Stand-pivot;BSC/3in1;Rolling walker (2 wheels) Toilet Transfer Details (indicate cue type and reason): leaving RW behind, cues for sequening Toileting- Clothing Manipulation and Hygiene: Moderate assistance Toileting - Clothing Manipulation Details (indicate cue type and reason): able to perform peri care after urination, max cues to assist in clothing  mgmt to doff/don brief around waist       General ADL Comments: Limited by cognition w/ slow processing and cues for problem solving needed. Poor initiation of tasks and vague responses    Extremity/Trunk Assessment Upper Extremity Assessment Upper Extremity  Assessment: Generalized weakness   Lower Extremity Assessment Lower Extremity Assessment: Defer to PT evaluation        Vision   Vision Assessment?: No apparent visual deficits   Perception     Praxis      Cognition Arousal/Alertness: Awake/alert Behavior During Therapy: Flat affect Overall Cognitive Status: No family/caregiver present to determine baseline cognitive functioning Area of Impairment: Problem solving;Following commands;Memory;Safety/judgement;Awareness;Attention                   Current Attention Level: Sustained Memory: Decreased short-term memory Following Commands: Follows one step commands consistently;Follows one step commands with increased time Safety/Judgement: Decreased awareness of safety Awareness: Emergent Problem Solving: Slow processing;Decreased initiation;Requires verbal cues;Requires tactile cues General Comments: Vague about responses in rehab vs home, reporting "its up to my children and the lord". Vague about PLOF answers as well, when asked if she wears briefs at home like she is now, reports "I do what the lord wants for me". Increased time to follow directions, cues for safety awareness and problem solving needed throughout          Exercises     Shoulder Instructions       General Comments HR 103bpm at highest, noted shivering, denied being cold - RN aware    Pertinent Vitals/ Pain       Pain Assessment: Faces Faces Pain Scale: Hurts a little bit Pain Location: upper back Pain Descriptors / Indicators: Guarding;Grimacing Pain Intervention(s): Monitored during session  Home Living                                          Prior Functioning/Environment              Frequency  Min 2X/week        Progress Toward Goals  OT Goals(current goals can now be found in the care plan section)  Progress towards OT goals: OT to reassess next treatment  Acute Rehab OT Goals Patient Stated Goal: feel  better soon, stop shivering OT Goal Formulation: With patient Time For Goal Achievement: 04/08/21 Potential to Achieve Goals: Good ADL Goals Pt Will Perform Grooming: with supervision;standing Pt Will Perform Upper Body Bathing: with supervision;sitting Pt Will Perform Lower Body Bathing: with supervision;sit to/from stand Pt Will Perform Upper Body Dressing: with supervision;sitting Pt Will Perform Lower Body Dressing: with supervision;sit to/from stand Pt Will Transfer to Toilet: with supervision;ambulating;bedside commode Additional ADL Goal #1: Pt will be independent in and OOB for basic ADLs Additional ADL Goal #2: Pt will be aware of energy conservation strategies from handout that may be of benefit to her.  Plan Discharge plan needs to be updated    Co-evaluation                 AM-PAC OT "6 Clicks" Daily Activity     Outcome Measure   Help from another person eating meals?: None Help from another person taking care of personal grooming?: A Little Help from another person toileting, which includes using toliet, bedpan, or urinal?: A Lot Help from another person bathing (including washing, rinsing, drying)?: A Lot Help from another  person to put on and taking off regular upper body clothing?: A Lot Help from another person to put on and taking off regular lower body clothing?: A Lot 6 Click Score: 15    End of Session Equipment Utilized During Treatment: Gait belt;Rolling walker (2 wheels)  OT Visit Diagnosis: Unsteadiness on feet (R26.81);Other abnormalities of gait and mobility (R26.89);Muscle weakness (generalized) (M62.81)   Activity Tolerance Patient limited by fatigue   Patient Left in bed;with call bell/phone within reach;with bed alarm set   Nurse Communication Mobility status        Time: 8756-4332 OT Time Calculation (min): 24 min  Charges: OT General Charges $OT Visit: 1 Visit OT Treatments $Self Care/Home Management : 23-37 mins  Malachy Chamber,  OTR/L Acute Rehab Services Office: [phone]   Layla Maw 03/28/2021, 7:51 AM

## 2021-03-28 NOTE — Progress Notes (Signed)
PROGRESS NOTE  Nicole Rush YHC:[id number] DOB: [birth year]   PCP: Lin Landsman, MD  Patient is from: Home  DOA: 03/21/2021 LOS: 7  Chief complaints:  Chief Complaint  Patient presents with   Chest Pain     Brief Narrative / Interim history: 80 year old F with PMH of asthma, cancer, HTN, CVA and recent flu infection presenting with 1 to 2 weeks of chest pain with associated dyspnea, nausea and vomiting, and admitted with pericarditis with trivial pericardial effusion and 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  She was febrile with significant leukocytosis.  CXR with poor inspiration but no active disease.  EKG with diffuse ST changes.  Cardiology consulted.  TTE with LVEF of 65 to 70%, indeterminate DD, moderate TVR and trivial pericardial effusion.  Patient was started on prednisone and colchicine.  Spiked another fever the morning of 12/6.  Blood cultures NGTD.  Urine culture with multiple species.  Subjective: Seen and examined this afternoon.  No major events overnight.  She had some chest pain earlier this morning that has resolved.  Currently no complaints.  She denies chest pain, dyspnea, GI or UTI symptoms.  Objective: Vitals:   03/28/21 0332 03/28/21 0808 03/28/21 1100 03/28/21 1200  BP: 109/73 124/76 (!) 141/72   Pulse: 85  (!) 107   Resp: 20 (!) 22 18   Temp: 98.7 F (37.1 C) 98.7 F (37.1 C) (!) 101 F (38.3 C) 99 F (37.2 C)  TempSrc: Oral Oral Oral   SpO2: 93% 95% 95%   Weight:      Height:        Intake/Output Summary (Last 24 hours) at 03/28/2021 1425 Last data filed at 03/28/2021 0345 Gross per 24 hour  Intake 120 ml  Output 450 ml  Net -330 ml   Filed Weights   03/23/21 1800  Weight: 43.3 kg    Examination:  GENERAL: No apparent distress.  Nontoxic. HEENT: MMM.  Vision and hearing grossly intact.  NECK: Supple.  No apparent JVD.  RESP: 92% on RA at rest. No IWOB.  Fair aeration bilaterally. CVS:  RRR. Heart sounds normal.  ABD/GI/GU: BS+. Abd soft, NTND.   MSK/EXT:  Moves extremities. No apparent deformity. No edema.  SKIN: no apparent skin lesion or wound NEURO: Awake, alert and oriented appropriately.  No apparent focal neuro deficit. PSYCH: Calm. Normal affect.   Procedures:  None  Microbiology summarized: 03/22/2021-Influenza A positive 03/22/2021-COVID-19 PCR negative. 03/26/2021-blood cultures NGTD. 03/26/2021-urine culture pending.  Assessment & Plan: Pericarditis with small Pericardial Effusion-felt to be postviral from influenza A infection. TTE as above.  CRP 36>> 10.  ESR 108>>70.  CT C/A/P raises concern for pericarditis.  Significant leukocytosis but improved. -Continue colchicine and prednisone -Check uric acid-might be late since she has already been on colchicine.   Influenza A: Initially diagnosed on 11/27. -Continue Oseltamivir 30 mg po BID although she was initially diagnosed on 11/27 -Supportive care with incentive telemetry, OOB, mucolytic's and antitussive  Fever: spiked fever to 102.8 the morning of 12/6.  Infectious work-up including blood culture, CXR, urinalysis and blood culture unrevealing.  Urine culture with multiple species.  Another fever to 101 this morning but appears well. -Continue monitoring -May consider short course of CTX empirically if fever continues to recur  Acute respiratory failure with hypoxia: Likely from influenza A and possible atelectasis. -Already on prednisone for pericarditis. -Continue Xopenex and Atrovent every 6 hours -Mucolytic's and antitussive, incentive spirometry, OOB/PT/OT. -Wean oxygen as able.  Minimum to keep saturation above 88%. -PCCM recommended  outpatient follow-up for calcified granuloma in left lung.   Hyponatremia/hypophosphatemia/hypokalemia/hypomagnesemia: Resolved.  Elevated liver enzymes: Pattern suggests rhabdo or EtOH but CK within normal. Recent Labs  Lab 03/24/21 0149 03/25/21 0029 03/26/21 0303 03/27/21 0544 03/28/21 0313  AST 59* 47* 106* 111*  96*  ALT 19 19 41 55* 62*  ALKPHOS 98 79 96 80 78  BILITOT 0.7 0.4 0.7 0.8 0.9  PROT 6.9 6.1* 6.9 5.8* 6.0*  ALBUMIN 2.1* 1.8* 2.1* 1.8* 1.8*  1.8*  -Continue holding Crestor -Recheck in the morning  Essential hypertension: Normotensive. -Continue holding home losartan and HCTZ  Microcytic anemia: H&H relatively stable.  Anemia panel consistent with anemia of chronic disease. Recent Labs    03/21/21 0935 03/22/21 0554 03/23/21 0100 03/24/21 0149 03/25/21 0029 03/26/21 0303 03/27/21 0544 03/28/21 0313  HGB 10.7* 9.6* 10.2* 9.8* 9.1* 12.1 9.8* 10.1*  -Monitor intermittently  Urinary Retention? -Closely monitor urine output -Intermittent bladder scan as needed   Metabolic Acidosis: Resolved.   Thrombocytosis: Resolved  Hyperbilirubinemia: Resolved.   Constipation -Bowel regimen  Generalized weakness/physical deconditioning  -PT/OT  Leukocytosis/bandemia: Likely demargination from steroid but she is also spiking fever -Continue monitoring  Severe malnutrition: As evidenced by low BMI for age and poor p.o. intake. Body mass index is 18.04 kg/m.  -Consult dietitian       DVT prophylaxis:  heparin injection 5,000 Units Start: 03/22/21 1400 SCDs Start: 03/22/21 0304  Code Status: Full code Family Communication: Updated patient's daughter, [name] over the phone on 12/7. Level of care: Telemetry Medical Status is: Inpatient  Remains inpatient appropriate because: fever, pericarditis and safe disposition       Consultants:  Cardiology   Sch Meds:  Scheduled Meds:  colchicine  0.6 mg Oral Daily   feeding supplement  237 mL Oral BID BM   guaiFENesin  1,200 mg Oral BID   heparin  5,000 Units Subcutaneous Q8H   loratadine  10 mg Oral Daily   pantoprazole  40 mg Oral Daily   polyethylene glycol  17 g Oral BID   predniSONE  20 mg Oral Q breakfast   senna-docusate  1 tablet Oral BID   Continuous Infusions: PRN Meds:.acetaminophen **OR**  acetaminophen, hydrOXYzine, ipratropium, levalbuterol, morphine injection, ondansetron **OR** ondansetron (ZOFRAN) IV, oxyCODONE  Antimicrobials: Anti-infectives (From admission, onward)    Start     Dose/Rate Route Frequency Ordered Stop   03/22/21 1000  oseltamivir (TAMIFLU) capsule 30 mg       Note to Pharmacy: Tamiflu 30 mg BID for CrCl <  60 mL/min   30 mg Oral 2 times daily 03/22/21 0304 03/23/21 2208        I have personally reviewed the following labs and images: CBC: Recent Labs  Lab 03/24/21 0149 03/25/21 0029 03/26/21 0303 03/27/21 0544 03/28/21 0313  WBC 13.2* 11.2* 9.3 15.3* 15.0*  NEUTROABS 11.8* 9.6* 7.9* 13.2* 12.6*  HGB 9.8* 9.1* 12.1 9.8* 10.1*  HCT 29.2* 26.7* 35.1* 28.3* 29.6*  MCV 76.6* 75.9* 74.5* 74.1* 74.4*  PLT 451* 406* 408* 331 288   BMP &GFR Recent Labs  Lab 03/24/21 0149 03/25/21 0029 03/26/21 0303 03/27/21 0544 03/28/21 0313  NA 140 137 134* 137 136  K 4.4 4.3 4.4 3.3* 4.3  CL 109 106 100 103 105  CO2 _0 GLUCOSE 121* 153* 88 85 87  BUN 44* 37* 34* 24* 21  CREATININE 0.98 0.74 0.92 0.66 0.70  CALCIUM 8.3* 8.4* 8.6* 7.7* 8.2*  MG 2.9* 2.4 2.0  1.7 2.1  PHOS 2.4* 2.7 1.9* 2.8 2.1*   Estimated Creatinine Clearance: 37.1 mL/min (by C-G formula based on SCr of 0.7 mg/dL). Liver & Pancreas: Recent Labs  Lab 03/24/21 0149 03/25/21 0029 03/26/21 0303 03/27/21 0544 03/28/21 0313  AST 59* 47* 106* 111* 96*  ALT 19 19 41 55* 62*  ALKPHOS 98 79 96 80 78  BILITOT 0.7 0.4 0.7 0.8 0.9  PROT 6.9 6.1* 6.9 5.8* 6.0*  ALBUMIN 2.1* 1.8* 2.1* 1.8* 1.8*  1.8*   No results for input(s): LIPASE, AMYLASE in the last 168 hours.  No results for input(s): AMMONIA in the last 168 hours. Diabetic: No results for input(s): HGBA1C in the last 72 hours. Recent Labs  Lab 03/22/21 1557  GLUCAP 109*   Cardiac Enzymes: Recent Labs  Lab 03/27/21 1430  CKTOTAL 92   No results for input(s): PROBNP in the last 8760 hours. Coagulation  Profile: No results for input(s): INR, PROTIME in the last 168 hours. Thyroid Function Tests: No results for input(s): TSH, T4TOTAL, FREET4, T3FREE, THYROIDAB in the last 72 hours. Lipid Profile: No results for input(s): CHOL, HDL, LDLCALC, TRIG, CHOLHDL, LDLDIRECT in the last 72 hours. Anemia Panel: No results for input(s): VITAMINB12, FOLATE, FERRITIN, TIBC, IRON, RETICCTPCT in the last 72 hours. Urine analysis:    Component Value Date/Time   COLORURINE ORANGE (A) 03/26/2021 0816   APPEARANCEUR CLEAR 03/26/2021 0816   LABSPEC 1.025 03/26/2021 0816   PHURINE 6.0 03/26/2021 0816   GLUCOSEU NEGATIVE 03/26/2021 0816   HGBUR SMALL (A) 03/26/2021 0816   BILIRUBINUR NEGATIVE 03/26/2021 0816   KETONESUR NEGATIVE 03/26/2021 0816   PROTEINUR 30 (A) 03/26/2021 0816   NITRITE NEGATIVE 03/26/2021 0816   LEUKOCYTESUR NEGATIVE 03/26/2021 0816   Sepsis Labs: Invalid input(s): PROCALCITONIN, [place]  Microbiology: Recent Results (from the past 240 hour(s))  Resp Panel by RT-PCR (Flu A&B, Covid) Nasopharyngeal Swab     Status: Abnormal   Collection Time: 03/22/21  9:29 AM   Specimen: Nasopharyngeal Swab; Nasopharyngeal(NP) swabs in vial transport medium  Result Value Ref Range Status   SARS Coronavirus 2 by RT PCR NEGATIVE NEGATIVE Final    Comment: (NOTE) SARS-CoV-2 target nucleic acids are NOT DETECTED.  The SARS-CoV-2 RNA is generally detectable in upper respiratory specimens during the acute phase of infection. The lowest concentration of SARS-CoV-2 viral copies this assay can detect is 138 copies/mL. A negative result does not preclude SARS-Cov-2 infection and should not be used as the sole basis for treatment or other patient management decisions. A negative result may occur with  improper specimen collection/handling, submission of specimen other than nasopharyngeal swab, presence of viral mutation(s) within the areas targeted by this assay, and inadequate number of  viral copies(<138 copies/mL). A negative result must be combined with clinical observations, patient history, and epidemiological information. The expected result is Negative.  Fact Sheet for Patients:  EntrepreneurPulse.com.au  Fact Sheet for Healthcare Providers:  IncredibleEmployment.be  This test is no t yet approved or cleared by the [place] FDA and  has been authorized for detection and/or diagnosis of SARS-CoV-2 by FDA under an Emergency Use Authorization (EUA). This EUA will remain  in effect (meaning this test can be used) for the duration of the COVID-19 declaration under Section 564(b)(1) of the Act, 21 U.S.C.section 360bbb-3(b)(1), unless the authorization is terminated  or revoked sooner.       Influenza A by PCR POSITIVE (A) NEGATIVE Final   Influenza B by PCR NEGATIVE NEGATIVE Final  Comment: (NOTE) The Xpert Xpress SARS-CoV-2/FLU/RSV plus assay is intended as an aid in the diagnosis of influenza from Nasopharyngeal swab specimens and should not be used as a sole basis for treatment. Nasal washings and aspirates are unacceptable for Xpert Xpress SARS-CoV-2/FLU/RSV testing.  Fact Sheet for Patients: EntrepreneurPulse.com.au  Fact Sheet for Healthcare Providers: IncredibleEmployment.be  This test is not yet approved or cleared by the [place] FDA and has been authorized for detection and/or diagnosis of SARS-CoV-2 by FDA under an Emergency Use Authorization (EUA). This EUA will remain in effect (meaning this test can be used) for the duration of the COVID-19 declaration under Section 564(b)(1) of the Act, 21 U.S.C. section 360bbb-3(b)(1), unless the authorization is terminated or revoked.  Performed at [facility], [place] [address], [place], [place] [address]   Urine Culture     Status: Abnormal   Collection Time: 03/26/21  8:17 AM   Specimen: Urine, Clean Catch   Result Value Ref Range Status   Specimen Description URINE, CLEAN CATCH  Final   Special Requests   Final    NONE Performed at [facility], [place] [address] [place], [place] [address]    Culture MULTIPLE SPECIES PRESENT, SUGGEST RECOLLECTION (A)  Final   Report Status 03/28/2021 FINAL  Final  Culture, blood (routine x 2)     Status: None (Preliminary result)   Collection Time: 03/26/21  9:27 AM   Specimen: BLOOD RIGHT HAND  Result Value Ref Range Status   Specimen Description BLOOD RIGHT HAND  Final   Special Requests   Final    AEROBIC BOTTLE ONLY Blood Culture results may not be optimal due to an inadequate volume of blood received in culture bottles   Culture   Final    NO GROWTH 2 DAYS Performed at [facility], [place] [address] [place], [place] [address]    Report Status PENDING  Incomplete  Culture, blood (routine x 2)     Status: None (Preliminary result)   Collection Time: 03/26/21 10:33 AM   Specimen: BLOOD RIGHT HAND  Result Value Ref Range Status   Specimen Description BLOOD RIGHT HAND  Final   Special Requests   Final    AEROBIC BOTTLE ONLY Blood Culture results may not be optimal due to an inadequate volume of blood received in culture bottles   Culture   Final    NO GROWTH 2 DAYS Performed at [facility], [place] [address], [place], [place] [address]    Report Status PENDING  Incomplete    Radiology Studies: No results found.     Marquavius Scaife T. [place]  If 7PM-7AM, please contact night-coverage [email or web address] 03/28/2021, 2:25 PM

## 2021-03-28 NOTE — TOC Progression Note (Signed)
Transition of Care [facility]) - Progression Note    Patient Details  Name: Nicole Rush MRN: [id number] Date of Birth: 1941/02/25  Transition of Care [facility]) CM/SW [place], [place] Phone Number: 03/28/2021, 4:08 PM  Clinical Narrative:   CSW contacted patient's daughter, Nicole Rush, to discuss SNF placement. Nicole Rush asked if SNF was possible, and CSW discussed that we could look into it but not sure that [facility] will approve with how well the patient is doing. Nicole Rush said the family had discussed and her niece, Nicole Rush, will take the patient home to her apartment at discharge. Cyatta's address is [address], [address], [place]. CSW passed information on to [facility] to follow up on DME delivery and home health.     Expected Discharge Plan: [place] Barriers to Discharge: Continued Medical Work up  Expected Discharge Plan and Services Expected Discharge Plan: [place]                         DME Arranged: Bedside commode, Walker rolling DME Agency: [employer] Date DME Agency Contacted: 03/25/21 Time DME Agency Contacted: [phone] Representative spoke with at DME Agency: [place]: PT, OT [place] Agency: [place] Date [place]: 03/25/21 Time [place]: [place] Representative spoke with at [place]: [place] ([place]) Interventions    Readmission Risk Interventions No flowsheet data found.

## 2021-03-28 NOTE — NC FL2 (Signed)
[place] LEVEL OF CARE SCREENING TOOL     IDENTIFICATION  Patient Name: Nicole Rush Birthdate: 01-12-1941 Sex: female Admission Date (Current Location): 03/21/2021  [facility] and [place] Number:  Herbalist and Address:  [facility] [place] [place] [address] [place], [place] [address]      Provider Number: [id number]  Attending Physician Name and Address:  Mercy Riding, MD  Relative Name and Phone Number:       Current Level of Care: Hospital Recommended Level of Care: [place] Prior Approval Number:    Date Approved/Denied:   PASRR Number: [id number]  Discharge Plan: SNF    Current Diagnoses: Patient Active Problem List   Diagnosis Date Noted   Protein-calorie malnutrition, moderate ([place]) 03/22/2021   Hyperlipidemia 03/22/2021   Essential hypertension 03/22/2021   Leukocytosis 03/22/2021   Falls 03/22/2021   Pericarditis 03/21/2021   Malignant neoplasm of upper-outer quadrant of left breast in female, estrogen receptor positive ([place]) 03/22/2019   Chest pain 08/11/2012   GERD (gastroesophageal reflux disease) 08/11/2012   Generalized anxiety disorder 08/11/2012    Orientation RESPIRATION BLADDER Height & Weight     Self, Time, Situation, Place  Normal Continent Weight: 95 lb 7.4 oz (43.3 kg) Height:  5\' 1"  (154.9 cm)  BEHAVIORAL SYMPTOMS/MOOD NEUROLOGICAL BOWEL NUTRITION STATUS      Continent Diet (heart healthy)  AMBULATORY STATUS COMMUNICATION OF NEEDS Skin   Limited Assist Verbally Normal                       Personal Care Assistance Level of Assistance  Bathing, Feeding, Dressing Bathing Assistance: Limited assistance Feeding assistance: Independent Dressing Assistance: Limited assistance     Functional Limitations Info             [place]  PT (By licensed PT), OT (By licensed OT)     PT Frequency: 5x/wk OT Frequency: 5x/wk             Contractures Contractures Info: Not present    Additional Factors Info  Code Status, Allergies Code Status Info: Full Allergies Info: NKA           Current Medications (03/28/2021):  This is the current hospital active medication list Current Facility-Administered Medications  Medication Dose Route Frequency Provider Last Rate Last Admin   acetaminophen (TYLENOL) tablet 650 mg  650 mg Oral Q6H PRN Zierle-Ghosh, Asia B, DO   650 mg at 03/28/21 1113   Or   acetaminophen (TYLENOL) suppository 650 mg  650 mg Rectal Q6H PRN Zierle-Ghosh, Asia B, DO       colchicine tablet 0.6 mg  0.6 mg Oral Daily Zierle-Ghosh, Asia B, DO   0.6 mg at 03/28/21 0924   feeding supplement (ENSURE ENLIVE / ENSURE PLUS) liquid 237 mL  237 mL Oral BID BM Zierle-Ghosh, Asia B, DO   237 mL at 03/28/21 1417   guaiFENesin (MUCINEX) 12 hr tablet 1,200 mg  1,200 mg Oral BID Raiford Noble Latif, DO   1,200 mg at 03/28/21 7412   heparin injection 5,000 Units  5,000 Units Subcutaneous Q8H Zierle-Ghosh, Asia B, DO   5,000 Units at 03/28/21 0641   hydrOXYzine (ATARAX) tablet 25 mg  25 mg Oral TID PRN Raiford Noble Latif, DO   25 mg at 03/26/21 2216   ipratropium (ATROVENT) nebulizer solution 0.5 mg  0.5 mg Nebulization Q6H PRN Mercy Riding, MD  levalbuterol (XOPENEX) nebulizer solution 0.63 mg  0.63 mg Nebulization Q6H PRN Mercy Riding, MD       loratadine (CLARITIN) tablet 10 mg  10 mg Oral Daily Zierle-Ghosh, Asia B, DO   10 mg at 03/28/21 0924   morphine 2 MG/ML injection 2 mg  2 mg Intravenous Q3H PRN Raiford Noble Latif, DO   2 mg at 03/28/21 0350   ondansetron (ZOFRAN) tablet 4 mg  4 mg Oral Q6H PRN Zierle-Ghosh, Asia B, DO   4 mg at 03/22/21 1341   Or   ondansetron (ZOFRAN) injection 4 mg  4 mg Intravenous Q6H PRN Zierle-Ghosh, Asia B, DO   4 mg at 03/28/21 1140   oxyCODONE (Oxy IR/ROXICODONE) immediate release tablet 5 mg  5 mg Oral Q4H PRN Zierle-Ghosh, Asia B, DO   5 mg at 03/27/21 2002   pantoprazole  (PROTONIX) EC tablet 40 mg  40 mg Oral Daily Raiford Noble [place], DO   40 mg at 03/28/21 8416   polyethylene glycol (MIRALAX / GLYCOLAX) packet 17 g  17 g Oral BID Raiford Noble [place], DO   17 g at 03/28/21 6063   predniSONE (DELTASONE) tablet 20 mg  20 mg Oral Q breakfast Raiford Noble [place], DO   20 mg at 03/28/21 0160   senna-docusate (Senokot-S) tablet 1 tablet  1 tablet Oral BID Raiford Noble [place], DO   1 tablet at 03/28/21 1093     Discharge Medications: Please see discharge summary for a list of discharge medications.  Relevant Imaging Results:  Relevant Lab Results:   Additional Information SS#: [id number]  Geralynn Ochs, LCSW

## 2021-03-28 NOTE — TOC Progression Note (Addendum)
Transition of Care [facility]) - Progression Note    Patient Details  Name: Nicole Rush MRN: [id number] Date of Birth: 1940-12-03  Transition of Care [facility]) CM/SW Contact  Nicole Mayo, RN Phone Number: 03/28/2021, 4:53 PM  Clinical Narrative:    NCM spoke with [name], she would like to say with [facility] for [place], [place].  [employer] will deliver the hospital bed, bsc and rolling walker, they will coordinate this with [name].  Address in [place] given to [place] with [place]. Shelia with [employer] will call [name] to get the address for delivery for DME.    Expected Discharge Plan: [place] Barriers to Discharge: Continued Medical Work up  Expected Discharge Plan and Services Expected Discharge Plan: [place]                         DME Arranged: Bedside commode, Walker rolling DME Agency: [employer] Date DME Agency Contacted: 03/25/21 Time DME Agency Contacted: [phone] Representative spoke with at DME Agency: [place]: PT, OT [place] Agency: [place] Date [place]: 03/25/21 Time [place]: [place] Representative spoke with at [place]: [place] ([employer]) Interventions    Readmission Risk Interventions No flowsheet data found.

## 2021-03-29 ENCOUNTER — Inpatient Hospital Stay (HOSPITAL_COMMUNITY): Payer: Medicare HMO

## 2021-03-29 DIAGNOSIS — R079 Chest pain, unspecified: Secondary | ICD-10-CM

## 2021-03-29 DIAGNOSIS — R000 Tachycardia, unspecified: Secondary | ICD-10-CM

## 2021-03-29 LAB — CBC
HCT: 30.4 % — ABNORMAL LOW (ref 36.0–46.0)
Hemoglobin: 10.5 g/dL — ABNORMAL LOW (ref 12.0–15.0)
MCH: 25.5 pg — ABNORMAL LOW (ref 26.0–34.0)
MCHC: 34.5 g/dL (ref 30.0–36.0)
MCV: 73.8 fL — ABNORMAL LOW (ref 80.0–100.0)
Platelets: 328 10*3/uL (ref 150–400)
RBC: 4.12 MIL/uL (ref 3.87–5.11)
RDW: 14.7 % (ref 11.5–15.5)
WBC: 19.1 10*3/uL — ABNORMAL HIGH (ref 4.0–10.5)
nRBC: 0 % (ref 0.0–0.2)

## 2021-03-29 LAB — HEPATIC FUNCTION PANEL
ALT: 50 U/L — ABNORMAL HIGH (ref 0–44)
AST: 57 U/L — ABNORMAL HIGH (ref 15–41)
Albumin: 1.9 g/dL — ABNORMAL LOW (ref 3.5–5.0)
Alkaline Phosphatase: 94 U/L (ref 38–126)
Bilirubin, Direct: 0.3 mg/dL — ABNORMAL HIGH (ref 0.0–0.2)
Indirect Bilirubin: 0.6 mg/dL (ref 0.3–0.9)
Total Bilirubin: 0.9 mg/dL (ref 0.3–1.2)
Total Protein: 6.3 g/dL — ABNORMAL LOW (ref 6.5–8.1)

## 2021-03-29 MED ORDER — METOPROLOL TARTRATE 12.5 MG HALF TABLET
12.5000 mg | ORAL_TABLET | Freq: Two times a day (BID) | ORAL | Status: DC
  Administered 2021-03-29 – 2021-04-01 (×7): 12.5 mg via ORAL
  Filled 2021-03-29 (×7): qty 1

## 2021-03-29 MED ORDER — SODIUM CHLORIDE 0.9 % IV SOLN
1.0000 g | INTRAVENOUS | Status: DC
  Administered 2021-03-29 – 2021-04-01 (×4): 1 g via INTRAVENOUS
  Filled 2021-03-29 (×5): qty 10

## 2021-03-29 NOTE — Progress Notes (Signed)
PROGRESS NOTE  Nicole Rush QJJ:[id number] DOB: 1940/09/30   PCP: Lin Landsman, MD  Patient is from: Home  DOA: 03/21/2021 LOS: 8  Chief complaints:  Chief Complaint  Patient presents with   Chest Pain     Brief Narrative / Interim history: 80 year old F with PMH of asthma, cancer, HTN, CVA and recent flu infection presenting with 1 to 2 weeks of chest pain with associated dyspnea, nausea and vomiting, and admitted with pericarditis with trivial pericardial effusion and 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  She was febrile with significant leukocytosis.  CXR with poor inspiration but no active disease.  EKG with diffuse ST changes.  Cardiology consulted.  TTE with LVEF of 65 to 70%, indeterminate DD, moderate TVR and trivial pericardial effusion.  Patient was started on prednisone and colchicine.  Spiked another fever the morning of 12/6.  Blood cultures NGTD.  Urine culture with multiple species.  Subjective: Seen and examined earlier this morning.  No major events overnight of this morning.  She complains of severe left-sided chest pain mainly with cough.  She rates her pain 10/10 although she does not appear to be in that much distress.  She denies shortness of breath.  She is a little tachycardic this morning.  No other complaints.  Objective: Vitals:   03/28/21 2312 03/29/21 0337 03/29/21 0737 03/29/21 1118  BP: 112/74 124/74 99/65 109/68  Pulse: 93 96 99 (!) 106  Resp: _0 Temp: 97.9 F (36.6 C) 97.6 F (36.4 C) 99.4 F (37.4 C) (!) 100.4 F (38 C)  TempSrc: Oral Oral Oral Oral  SpO2: 94% 94% 92% 90%  Weight:      Height:        Intake/Output Summary (Last 24 hours) at 03/29/2021 1216 Last data filed at 03/29/2021 0940 Gross per 24 hour  Intake 240 ml  Output 300 ml  Net -60 ml   Filed Weights   03/23/21 1800  Weight: 43.3 kg    Examination:  GENERAL: Frail looking elderly female.  No apparent distress. HEENT: MMM.  Vision and hearing grossly intact.  NECK: Supple.   No apparent JVD.  RESP: 90% on RA.  No IWOB.  Fair aeration bilaterally. CVS: Tachycardic to 110s.  Regular rhythm.  Heart sounds normal.  ABD/GI/GU: BS+. Abd soft, NTND.  MSK/EXT:  Moves extremities. No apparent deformity. No edema.  SKIN: no apparent skin lesion or wound NEURO: Awake and alert. Oriented appropriately.  No apparent focal neuro deficit. PSYCH: Calm. Normal affect.   Procedures:  None  Microbiology summarized: 03/22/2021-Influenza A positive 03/22/2021-COVID-19 PCR negative. 03/26/2021-blood cultures NGTD. 03/26/2021-urine culture pending.  Assessment & Plan: Pericarditis with small Pericardial Effusion-felt to be postviral from influenza A infection. TTE as above.  CRP 36>> 10.  ESR 108>>70.  CT C/A/P raises concern for pericarditis.  Significant leukocytosis but improved. -Continue colchicine and prednisone -Check uric acid-might be late since she has already been on colchicine.   Influenza A: Initially diagnosed on 11/27. -Continue Oseltamivir 30 mg po BID although she was initially diagnosed on 11/27 -Supportive care with incentive telemetry, OOB, mucolytic's and antitussive  Fever: 102.8 on 12/6, 101 on 12/8 and 100.4 on 12/9.  Overall, fever curve downtrending.  No clear source.  Blood culture, CXR and UA unrevealing.  Urine culture with multiple species.  Could be from her pericarditis but rising leukocytosis concerning although could be from steroid.  She is also tachycardic. -Continue monitoring -I am inclined to start empiric antibiotics for 5 days. -Follow  repeat chest x-ray  Acute respiratory failure with hypoxia: Likely from influenza A and possible atelectasis. -Already on prednisone for pericarditis. -Continue Xopenex and Atrovent every 6 hours -Mucolytic's and antitussive, incentive spirometry, OOB/PT/OT. -Wean oxygen as able.  Minimum to keep saturation above 88%. -PCCM recommended outpatient follow-up for calcified granuloma in left lung. -Repeat  chest x-ray this morning  Left-sided chest pain: Mainly with cough.  Likely from pericarditis.  However, she has fever and tachycardia. -Repeat chest x-ray -Antibiotics as above.  Sinus tachycardia: HR in 110s.  Regular rhythm. -Start low-dose metoprolol.   Hyponatremia/hypophosphatemia/hypokalemia/hypomagnesemia: Resolved.  Elevated liver enzymes: Pattern suggests rhabdo or EtOH but CK within normal. Recent Labs  Lab 03/25/21 0029 03/26/21 0303 03/27/21 0544 03/28/21 0313 03/29/21 0426  AST 47* 106* 111* 96* 57*  ALT 19 41 55* 62* 50*  ALKPHOS 79 96 80 78 94  BILITOT 0.4 0.7 0.8 0.9 0.9  PROT 6.1* 6.9 5.8* 6.0* 6.3*  ALBUMIN 1.8* 2.1* 1.8* 1.8*  1.8* 1.9*  -Continue holding Crestor -Recheck in the morning  Essential hypertension: Normotensive. -Continue holding home losartan and HCTZ  Microcytic anemia: H&H relatively stable.  Anemia panel consistent with anemia of chronic disease. Recent Labs    03/21/21 0935 03/22/21 0554 03/23/21 0100 03/24/21 0149 03/25/21 0029 03/26/21 0303 03/27/21 0544 03/28/21 0313 03/29/21 0426  HGB 10.7* 9.6* 10.2* 9.8* 9.1* 12.1 9.8* 10.1* 10.5*  -Monitor intermittently  Urinary Retention? -Closely monitor urine output -Intermittent bladder scan as needed   Metabolic Acidosis: Resolved.   Thrombocytosis: Resolved  Hyperbilirubinemia: Resolved.   Constipation -Bowel regimen  Generalized weakness/physical deconditioning  -PT/OT  Leukocytosis/bandemia: Likely demargination from steroid but she is also spiking fever -Continue monitoring  Severe malnutrition: As evidenced by low BMI for age and poor p.o. intake. Body mass index is 18.04 kg/m.  -Consult dietitian       DVT prophylaxis:  heparin injection 5,000 Units Start: 03/22/21 1400 SCDs Start: 03/22/21 0304  Code Status: Full code Family Communication: Updated patient's daughter, [name] over the phone on 12/7. Level of care: Telemetry Medical Status is:  Inpatient  Remains inpatient appropriate because: fever, pericarditis and safe disposition       Consultants:  Cardiology   Sch Meds:  Scheduled Meds:  colchicine  0.6 mg Oral Daily   feeding supplement  237 mL Oral BID BM   guaiFENesin  1,200 mg Oral BID   heparin  5,000 Units Subcutaneous Q8H   loratadine  10 mg Oral Daily   metoprolol tartrate  12.5 mg Oral BID   pantoprazole  40 mg Oral Daily   polyethylene glycol  17 g Oral BID   predniSONE  20 mg Oral Q breakfast   senna-docusate  1 tablet Oral BID   Continuous Infusions: PRN Meds:.acetaminophen **OR** acetaminophen, hydrOXYzine, ipratropium, levalbuterol, morphine injection, ondansetron **OR** ondansetron (ZOFRAN) IV, oxyCODONE  Antimicrobials: Anti-infectives (From admission, onward)    Start     Dose/Rate Route Frequency Ordered Stop   03/22/21 1000  oseltamivir (TAMIFLU) capsule 30 mg       Note to Pharmacy: Tamiflu 30 mg BID for CrCl <  60 mL/min   30 mg Oral 2 times daily 03/22/21 0304 03/23/21 2208        I have personally reviewed the following labs and images: CBC: Recent Labs  Lab 03/24/21 0149 03/25/21 0029 03/26/21 0303 03/27/21 0544 03/28/21 0313 03/29/21 0426  WBC 13.2* 11.2* 9.3 15.3* 15.0* 19.1*  NEUTROABS 11.8* 9.6* 7.9* 13.2* 12.6*  --  HGB 9.8* 9.1* 12.1 9.8* 10.1* 10.5*  HCT 29.2* 26.7* 35.1* 28.3* 29.6* 30.4*  MCV 76.6* 75.9* 74.5* 74.1* 74.4* 73.8*  PLT 451* 406* 408* 331 288 328   BMP &GFR Recent Labs  Lab 03/24/21 0149 03/25/21 0029 03/26/21 0303 03/27/21 0544 03/28/21 0313  NA 140 137 134* 137 136  K 4.4 4.3 4.4 3.3* 4.3  CL 109 106 100 103 105  CO2 _0 GLUCOSE 121* 153* 88 85 87  BUN 44* 37* 34* 24* 21  CREATININE 0.98 0.74 0.92 0.66 0.70  CALCIUM 8.3* 8.4* 8.6* 7.7* 8.2*  MG 2.9* 2.4 2.0 1.7 2.1  PHOS 2.4* 2.7 1.9* 2.8 2.1*   Estimated Creatinine Clearance: 37.1 mL/min (by C-G formula based on SCr of 0.7 mg/dL). Liver & Pancreas: Recent Labs   Lab 03/25/21 0029 03/26/21 0303 03/27/21 0544 03/28/21 0313 03/29/21 0426  AST 47* 106* 111* 96* 57*  ALT 19 41 55* 62* 50*  ALKPHOS 79 96 80 78 94  BILITOT 0.4 0.7 0.8 0.9 0.9  PROT 6.1* 6.9 5.8* 6.0* 6.3*  ALBUMIN 1.8* 2.1* 1.8* 1.8*  1.8* 1.9*   No results for input(s): LIPASE, AMYLASE in the last 168 hours.  No results for input(s): AMMONIA in the last 168 hours. Diabetic: No results for input(s): HGBA1C in the last 72 hours. Recent Labs  Lab 03/22/21 1557  GLUCAP 109*   Cardiac Enzymes: Recent Labs  Lab 03/27/21 1430  CKTOTAL 92   No results for input(s): PROBNP in the last 8760 hours. Coagulation Profile: No results for input(s): INR, PROTIME in the last 168 hours. Thyroid Function Tests: No results for input(s): TSH, T4TOTAL, FREET4, T3FREE, THYROIDAB in the last 72 hours. Lipid Profile: No results for input(s): CHOL, HDL, LDLCALC, TRIG, CHOLHDL, LDLDIRECT in the last 72 hours. Anemia Panel: No results for input(s): VITAMINB12, FOLATE, FERRITIN, TIBC, IRON, RETICCTPCT in the last 72 hours. Urine analysis:    Component Value Date/Time   COLORURINE ORANGE (A) 03/26/2021 0816   APPEARANCEUR CLEAR 03/26/2021 0816   LABSPEC 1.025 03/26/2021 0816   PHURINE 6.0 03/26/2021 0816   GLUCOSEU NEGATIVE 03/26/2021 0816   HGBUR SMALL (A) 03/26/2021 0816   BILIRUBINUR NEGATIVE 03/26/2021 0816   KETONESUR NEGATIVE 03/26/2021 0816   PROTEINUR 30 (A) 03/26/2021 0816   NITRITE NEGATIVE 03/26/2021 0816   LEUKOCYTESUR NEGATIVE 03/26/2021 0816   Sepsis Labs: Invalid input(s): PROCALCITONIN, [place]  Microbiology: Recent Results (from the past 240 hour(s))  Resp Panel by RT-PCR (Flu A&B, Covid) Nasopharyngeal Swab     Status: Abnormal   Collection Time: 03/22/21  9:29 AM   Specimen: Nasopharyngeal Swab; Nasopharyngeal(NP) swabs in vial transport medium  Result Value Ref Range Status   SARS Coronavirus 2 by RT PCR NEGATIVE NEGATIVE Final    Comment:  (NOTE) SARS-CoV-2 target nucleic acids are NOT DETECTED.  The SARS-CoV-2 RNA is generally detectable in upper respiratory specimens during the acute phase of infection. The lowest concentration of SARS-CoV-2 viral copies this assay can detect is 138 copies/mL. A negative result does not preclude SARS-Cov-2 infection and should not be used as the sole basis for treatment or other patient management decisions. A negative result may occur with  improper specimen collection/handling, submission of specimen other than nasopharyngeal swab, presence of viral mutation(s) within the areas targeted by this assay, and inadequate number of viral copies(<138 copies/mL). A negative result must be combined with clinical observations, patient history, and epidemiological information. The expected result is Negative.  Fact  Sheet for Patients:  EntrepreneurPulse.com.au  Fact Sheet for Healthcare Providers:  IncredibleEmployment.be  This test is no t yet approved or cleared by the [place] FDA and  has been authorized for detection and/or diagnosis of SARS-CoV-2 by FDA under an Emergency Use Authorization (EUA). This EUA will remain  in effect (meaning this test can be used) for the duration of the COVID-19 declaration under Section 564(b)(1) of the Act, 21 U.S.C.section 360bbb-3(b)(1), unless the authorization is terminated  or revoked sooner.       Influenza A by PCR POSITIVE (A) NEGATIVE Final   Influenza B by PCR NEGATIVE NEGATIVE Final    Comment: (NOTE) The Xpert Xpress SARS-CoV-2/FLU/RSV plus assay is intended as an aid in the diagnosis of influenza from Nasopharyngeal swab specimens and should not be used as a sole basis for treatment. Nasal washings and aspirates are unacceptable for Xpert Xpress SARS-CoV-2/FLU/RSV testing.  Fact Sheet for Patients: EntrepreneurPulse.com.au  Fact Sheet for Healthcare  Providers: IncredibleEmployment.be  This test is not yet approved or cleared by the [place] FDA and has been authorized for detection and/or diagnosis of SARS-CoV-2 by FDA under an Emergency Use Authorization (EUA). This EUA will remain in effect (meaning this test can be used) for the duration of the COVID-19 declaration under Section 564(b)(1) of the Act, 21 U.S.C. section 360bbb-3(b)(1), unless the authorization is terminated or revoked.  Performed at [facility], [place] [address] [place], [place] [address]   Urine Culture     Status: Abnormal   Collection Time: 03/26/21  8:17 AM   Specimen: Urine, Clean Catch  Result Value Ref Range Status   Specimen Description URINE, CLEAN CATCH  Final   Special Requests   Final    NONE Performed at [facility], [place] [address] [place], [place] [address]    Culture MULTIPLE SPECIES PRESENT, SUGGEST RECOLLECTION (A)  Final   Report Status 03/28/2021 FINAL  Final  Culture, blood (routine x 2)     Status: None (Preliminary result)   Collection Time: 03/26/21  9:27 AM   Specimen: BLOOD RIGHT HAND  Result Value Ref Range Status   Specimen Description BLOOD RIGHT HAND  Final   Special Requests   Final    AEROBIC BOTTLE ONLY Blood Culture results may not be optimal due to an inadequate volume of blood received in culture bottles   Culture   Final    NO GROWTH 3 DAYS Performed at [facility], [place] [address] [place], [place] [address]    Report Status PENDING  Incomplete  Culture, blood (routine x 2)     Status: None (Preliminary result)   Collection Time: 03/26/21 10:33 AM   Specimen: BLOOD RIGHT HAND  Result Value Ref Range Status   Specimen Description BLOOD RIGHT HAND  Final   Special Requests   Final    AEROBIC BOTTLE ONLY Blood Culture results may not be optimal due to an inadequate volume of blood received in culture bottles   Culture   Final    NO GROWTH 3 DAYS Performed at [facility], [place] [address] [place], [place] [address]    Report Status PENDING  Incomplete    Radiology Studies: No results found.     Ines Rebel T. [place]  If 7PM-7AM, please contact night-coverage [email or web address] 03/29/2021, 12:16 PM

## 2021-03-29 NOTE — Plan of Care (Signed)
  Problem: Education: Goal: Knowledge of General Education information will improve Description: Including pain rating scale, medication(s)/side effects and non-pharmacologic comfort measures 03/29/2021 0006 by Trellis Moment, RN Outcome: Progressing 03/29/2021 0005 by Trellis Moment, RN Outcome: Progressing   Problem: Health Behavior/Discharge Planning: Goal: Ability to manage health-related needs will improve 03/29/2021 0006 by Trellis Moment, RN Outcome: Progressing 03/29/2021 0005 by Trellis Moment, RN Outcome: Progressing   Problem: Clinical Measurements: Goal: Ability to maintain clinical measurements within normal limits will improve 03/29/2021 0006 by Trellis Moment, RN Outcome: Progressing 03/29/2021 0005 by Trellis Moment, RN Outcome: Progressing Goal: Will remain free from infection 03/29/2021 0006 by Trellis Moment, RN Outcome: Progressing 03/29/2021 0005 by Trellis Moment, RN Outcome: Progressing Goal: Diagnostic test results will improve 03/29/2021 0006 by Trellis Moment, RN Outcome: Progressing 03/29/2021 0005 by Trellis Moment, RN Outcome: Progressing Goal: Respiratory complications will improve 03/29/2021 0006 by Trellis Moment, RN Outcome: Progressing 03/29/2021 0005 by Trellis Moment, RN Outcome: Progressing Goal: Cardiovascular complication will be avoided 03/29/2021 0006 by Trellis Moment, RN Outcome: Progressing 03/29/2021 0005 by Trellis Moment, RN Outcome: Progressing   Problem: Activity: Goal: Risk for activity intolerance will decrease 03/29/2021 0006 by Trellis Moment, RN Outcome: Progressing 03/29/2021 0005 by Trellis Moment, RN Outcome: Progressing   Problem: Nutrition: Goal: Adequate nutrition will be maintained 03/29/2021 0006 by Trellis Moment, RN Outcome: Progressing 03/29/2021 0005 by Trellis Moment, RN Outcome: Progressing   Problem: Coping: Goal: Level of anxiety will decrease 03/29/2021 0006 by Trellis Moment, RN Outcome: Progressing 03/29/2021 0005 by Trellis Moment,  RN Outcome: Progressing   Problem: Elimination: Goal: Will not experience complications related to bowel motility 03/29/2021 0006 by Trellis Moment, RN Outcome: Progressing 03/29/2021 0005 by Trellis Moment, RN Outcome: Progressing Goal: Will not experience complications related to urinary retention 03/29/2021 0006 by Trellis Moment, RN Outcome: Progressing 03/29/2021 0005 by Trellis Moment, RN Outcome: Progressing   Problem: Pain Managment: Goal: General experience of comfort will improve 03/29/2021 0006 by Trellis Moment, RN Outcome: Progressing 03/29/2021 0005 by Trellis Moment, RN Outcome: Progressing   Problem: Safety: Goal: Ability to remain free from injury will improve 03/29/2021 0006 by Trellis Moment, RN Outcome: Progressing 03/29/2021 0005 by Trellis Moment, RN Outcome: Progressing   Problem: Skin Integrity: Goal: Risk for impaired skin integrity will decrease 03/29/2021 0006 by Trellis Moment, RN Outcome: Progressing 03/29/2021 0005 by Trellis Moment, RN Outcome: Progressing

## 2021-03-29 NOTE — TOC Progression Note (Signed)
Transition of Care [facility]) - Progression Note    Patient Details  Name: Nicole Rush MRN: [id number] Date of Birth: 26-Aug-1940  [facility]) CM/SW Contact  Zenon Mayo, RN Phone Number: 03/29/2021, 4:32 PM  Clinical Narrative:    Patient has been spiking temps, started iv abx today.   Expected Discharge Plan: [place] Barriers to Discharge: Continued Medical Work up  Expected Discharge Plan and Services Expected Discharge Plan: [place]                         DME Arranged: Bedside commode, Walker rolling DME Agency: [employer] Date DME Agency Contacted: 03/25/21 Time DME Agency Contacted: [phone] Representative spoke with at DME Agency: [place]: PT, OT [place] Agency: [place] Date [place]: 03/25/21 Time [place]: [place] Representative spoke with at [place]: [place] ([place]) Interventions    Readmission Risk Interventions No flowsheet data found.

## 2021-03-30 LAB — CBC
HCT: 28.3 % — ABNORMAL LOW (ref 36.0–46.0)
Hemoglobin: 9.6 g/dL — ABNORMAL LOW (ref 12.0–15.0)
MCH: 25.4 pg — ABNORMAL LOW (ref 26.0–34.0)
MCHC: 33.9 g/dL (ref 30.0–36.0)
MCV: 74.9 fL — ABNORMAL LOW (ref 80.0–100.0)
Platelets: 343 10*3/uL (ref 150–400)
RBC: 3.78 MIL/uL — ABNORMAL LOW (ref 3.87–5.11)
RDW: 14.6 % (ref 11.5–15.5)
WBC: 17.2 10*3/uL — ABNORMAL HIGH (ref 4.0–10.5)
nRBC: 0.2 % (ref 0.0–0.2)

## 2021-03-30 LAB — RENAL FUNCTION PANEL
Albumin: 1.9 g/dL — ABNORMAL LOW (ref 3.5–5.0)
Anion gap: 11 (ref 5–15)
BUN: 30 mg/dL — ABNORMAL HIGH (ref 8–23)
CO2: 19 mmol/L — ABNORMAL LOW (ref 22–32)
Calcium: 8.3 mg/dL — ABNORMAL LOW (ref 8.9–10.3)
Chloride: 104 mmol/L (ref 98–111)
Creatinine, Ser: 0.67 mg/dL (ref 0.44–1.00)
GFR, Estimated: >60 mL/min (ref >60)
Glucose, Bld: 116 mg/dL — ABNORMAL HIGH (ref 70–99)
Phosphorus: 2.9 mg/dL (ref 2.5–4.6)
Potassium: 4.7 mmol/L (ref 3.5–5.1)
Sodium: 134 mmol/L — ABNORMAL LOW (ref 135–145)

## 2021-03-30 LAB — MAGNESIUM: Magnesium: 2.1 mg/dL (ref 1.7–2.4)

## 2021-03-30 MED ORDER — HYDROXYZINE HCL 10 MG PO TABS
10.0000 mg | ORAL_TABLET | Freq: Three times a day (TID) | ORAL | Status: DC | PRN
  Administered 2021-04-01: 07:00:00 10 mg via ORAL
  Filled 2021-03-30: qty 1

## 2021-03-30 MED ORDER — MELATONIN 5 MG PO TABS
5.0000 mg | ORAL_TABLET | Freq: Every day | ORAL | Status: DC
  Administered 2021-03-30 – 2021-04-05 (×7): 5 mg via ORAL
  Filled 2021-03-30 (×7): qty 1

## 2021-03-30 MED ORDER — PREDNISONE 10 MG PO TABS: 10.0000 mg | ORAL_TABLET | Freq: Every day | ORAL | Status: DC

## 2021-03-30 MED ORDER — PREDNISONE 10 MG PO TABS: 5.0000 mg | ORAL_TABLET | Freq: Every day | ORAL | Status: DC

## 2021-03-30 MED ORDER — PREDNISONE 2.5 MG PO TABS: 2.5000 mg | ORAL_TABLET | Freq: Every day | ORAL | Status: DC

## 2021-03-30 MED ORDER — PREDNISONE 10 MG PO TABS
15.0000 mg | ORAL_TABLET | Freq: Every day | ORAL | Status: DC
  Administered 2021-03-31 – 2021-04-03 (×3): 15 mg via ORAL
  Filled 2021-03-30 (×3): qty 2

## 2021-03-30 NOTE — Plan of Care (Signed)

## 2021-03-30 NOTE — Progress Notes (Signed)
PROGRESS NOTE  Nicole Rush HWT:[id number] DOB: 1940/09/30   PCP: Lin Landsman, MD  Patient is from: Home  DOA: 03/21/2021 LOS: 9  Chief complaints:  Chief Complaint  Patient presents with   Chest Pain     Brief Narrative / Interim history: 80 year old F with PMH of asthma, cancer, HTN, CVA and recent flu infection presenting with 1 to 2 weeks of chest pain with associated dyspnea, nausea and vomiting, and admitted with pericarditis with trivial pericardial effusion and 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  She was febrile with significant leukocytosis.  CXR with poor inspiration but no active disease.  EKG with diffuse ST changes.  Cardiology consulted.  TTE with LVEF of 65 to 70%, indeterminate DD, moderate TVR and trivial pericardial effusion.  Patient was started on prednisone and colchicine.  Spiked another fever the morning of 12/6.  Blood cultures NGTD.  Urine culture with multiple species.  Subjective: Seen and examined earlier this morning.  No major events overnight of this morning.  She reports feeling "much better".  She denies chest pain, shortness of breath, GI or UTI symptoms.  Objective: Vitals:   03/29/21 1927 03/30/21 0004 03/30/21 0327 03/30/21 0824  BP: 111/69 100/67 116/75 118/64  Pulse: 85 76 80 90  Resp: _0 Temp: 98 F (36.7 C) 97.8 F (36.6 C) 97.6 F (36.4 C) 97.8 F (36.6 C)  TempSrc: Oral Oral Oral Oral  SpO2: 93% 95% 93% 94%  Weight:      Height:        Intake/Output Summary (Last 24 hours) at 03/30/2021 1051 Last data filed at 03/30/2021 1035 Gross per 24 hour  Intake 640 ml  Output 325 ml  Net 315 ml   Filed Weights   03/23/21 1800  Weight: 43.3 kg    Examination:  GENERAL: Frail looking elderly female.  No apparent distress. HEENT: MMM.  Vision and hearing grossly intact.  NECK: Supple.  No apparent JVD.  RESP: 94% on RA.  No IWOB.  Fair aeration bilaterally. CVS:  RRR. Heart sounds normal.  ABD/GI/GU: BS+. Abd soft, NTND.  MSK/EXT:   Moves extremities. No apparent deformity. No edema.  SKIN: no apparent skin lesion or wound NEURO: Awake and alert. Oriented appropriately.  No apparent focal neuro deficit. PSYCH: Calm. Normal affect.   Procedures:  None  Microbiology summarized: 03/22/2021-Influenza A positive 03/22/2021-COVID-19 PCR negative. 03/26/2021-blood cultures NGTD. 03/26/2021-urine culture pending.  Assessment & Plan: Pericarditis with small Pericardial Effusion-felt to be postviral from influenza A infection. TTE as above.  CRP 36>> 10.  ESR 108>>70.  CT C/A/P raises concern for pericarditis.  Significant leukocytosis but on a steroid.  Fever curve downtrending. -Continue colchicine and prednisone   Influenza A: Initially diagnosed on 11/27. -Completed course of Tamiflu. -Supportive care with incentive telemetry, OOB, mucolytic's and antitussive  Fever: 102.8 on 12/6, 101 on 12/8 and 100.4 on 12/9.  Overall, fever curve downtrending.  No clear source.  Blood culture, CXR x2 and UA unrevealing.  Urine culture with multiple species.  Could be from her pericarditis but rising leukocytosis concerning although could be from steroid.  -Continue monitoring -Continue IV ceftriaxone empirically  Acute respiratory failure with hypoxia: Likely from influenza A and possible atelectasis.  Resolved. -Already on prednisone for pericarditis. -Continue Xopenex and Atrovent every 6 hours -Mucolytic's and antitussive, incentive spirometry, OOB/PT/OT. -PCCM recommended outpatient follow-up for calcified granuloma in left lung.  Left-sided chest pain: Mainly with cough.  Likely from pericarditis.  CXR unrevealing.  Resolved. -Antibiotics  as above.  Sinus tachycardia: Resolved. -Continue low-dose metoprolol.   Hyponatremia/hypophosphatemia/hypokalemia/hypomagnesemia: Resolved.  Elevated liver enzymes: Pattern suggests rhabdo or EtOH but CK within normal. Recent Labs  Lab 03/25/21 0029 03/26/21 0303 03/27/21 0544  03/28/21 0313 03/29/21 0426 03/30/21 0040  AST 47* 106* 111* 96* 57*  --   ALT 19 41 55* 62* 50*  --   ALKPHOS 79 96 80 78 94  --   BILITOT 0.4 0.7 0.8 0.9 0.9  --   PROT 6.1* 6.9 5.8* 6.0* 6.3*  --   ALBUMIN 1.8* 2.1* 1.8* 1.8*  1.8* 1.9* 1.9*  -Continue holding Crestor -Recheck  Essential hypertension: Normotensive. -Continue holding home losartan and HCTZ  Microcytic anemia: H&H relatively stable.  Anemia panel consistent with anemia of chronic disease. Recent Labs    03/21/21 0935 03/22/21 0554 03/23/21 0100 03/24/21 0149 03/25/21 0029 03/26/21 0303 03/27/21 0544 03/28/21 0313 03/29/21 0426 03/30/21 0109  HGB 10.7* 9.6* 10.2* 9.8* 9.1* 12.1 9.8* 10.1* 10.5* 9.6*  -Monitor intermittently  Urinary Retention?  Resolved. -Intermittent bladder scan as needed   Metabolic Acidosis:  -Monitor   Thrombocytosis: Resolved  Hyperbilirubinemia: Resolved.   Constipation -Bowel regimen  Generalized weakness/physical deconditioning  -PT/OT  Leukocytosis/bandemia: Likely demargination from steroid but she is also spiking fever -Continue monitoring  Severe malnutrition: As evidenced by low BMI for age and poor p.o. intake. Body mass index is 18.04 kg/m.  -Consult dietitian       DVT prophylaxis:  heparin injection 5,000 Units Start: 03/22/21 1400 SCDs Start: 03/22/21 0304  Code Status: Full code Family Communication: Updated patient's daughter, [name] over the phone on 12/7. Level of care: Telemetry Medical Status is: Inpatient  Remains inpatient appropriate because: Safe disposition/SNF bed.  Medically stable for discharge   Final disposition: SNF    Consultants:  Cardiology-signed off   Sch Meds:  Scheduled Meds:  colchicine  0.6 mg Oral Daily   feeding supplement  237 mL Oral BID BM   guaiFENesin  1,200 mg Oral BID   heparin  5,000 Units Subcutaneous Q8H   loratadine  10 mg Oral Daily   metoprolol tartrate  12.5 mg Oral BID   pantoprazole  40  mg Oral Daily   polyethylene glycol  17 g Oral BID   predniSONE  20 mg Oral Q breakfast   senna-docusate  1 tablet Oral BID   Continuous Infusions:  cefTRIAXone (ROCEPHIN)  IV Stopped (03/29/21 1500)   PRN Meds:.acetaminophen **OR** acetaminophen, hydrOXYzine, ipratropium, levalbuterol, morphine injection, ondansetron **OR** ondansetron (ZOFRAN) IV, oxyCODONE  Antimicrobials: Anti-infectives (From admission, onward)    Start     Dose/Rate Route Frequency Ordered Stop   03/29/21 1400  cefTRIAXone (ROCEPHIN) 1 g in sodium chloride 0.9 % 100 mL IVPB        1 g 200 mL/hr over 30 Minutes Intravenous Every 24 hours 03/29/21 1221 04/03/21 1359   03/22/21 1000  oseltamivir (TAMIFLU) capsule 30 mg       Note to Pharmacy: Tamiflu 30 mg BID for CrCl <  60 mL/min   30 mg Oral 2 times daily 03/22/21 0304 03/23/21 2208        I have personally reviewed the following labs and images: CBC: Recent Labs  Lab 03/24/21 0149 03/25/21 0029 03/26/21 0303 03/27/21 0544 03/28/21 0313 03/29/21 0426 03/30/21 0109  WBC 13.2* 11.2* 9.3 15.3* 15.0* 19.1* 17.2*  NEUTROABS 11.8* 9.6* 7.9* 13.2* 12.6*  --   --   HGB 9.8* 9.1* 12.1 9.8* 10.1* 10.5* 9.6*  HCT 29.2* 26.7* 35.1* 28.3* 29.6* 30.4* 28.3*  MCV 76.6* 75.9* 74.5* 74.1* 74.4* 73.8* 74.9*  PLT 451* 406* 408* 331 288 328 343   BMP &GFR Recent Labs  Lab 03/25/21 0029 03/26/21 0303 03/27/21 0544 03/28/21 0313 03/30/21 0040 03/30/21 0109  NA 137 134* 137 136 134*  --   K 4.3 4.4 3.3* 4.3 4.7  --   CL 106 100 103 105 104  --   CO2 _0 19*  --   GLUCOSE 153* 88 85 87 116*  --   BUN 37* 34* 24* 21 30*  --   CREATININE 0.74 0.92 0.66 0.70 0.67  --   CALCIUM 8.4* 8.6* 7.7* 8.2* 8.3*  --   MG 2.4 2.0 1.7 2.1  --  2.1  PHOS 2.7 1.9* 2.8 2.1* 2.9  --    Estimated Creatinine Clearance: 37.1 mL/min (by C-G formula based on SCr of 0.67 mg/dL). Liver & Pancreas: Recent Labs  Lab 03/25/21 0029 03/26/21 0303 03/27/21 0544  03/28/21 0313 03/29/21 0426 03/30/21 0040  AST 47* 106* 111* 96* 57*  --   ALT 19 41 55* 62* 50*  --   ALKPHOS 79 96 80 78 94  --   BILITOT 0.4 0.7 0.8 0.9 0.9  --   PROT 6.1* 6.9 5.8* 6.0* 6.3*  --   ALBUMIN 1.8* 2.1* 1.8* 1.8*  1.8* 1.9* 1.9*   No results for input(s): LIPASE, AMYLASE in the last 168 hours.  No results for input(s): AMMONIA in the last 168 hours. Diabetic: No results for input(s): HGBA1C in the last 72 hours. No results for input(s): GLUCAP in the last 168 hours.  Cardiac Enzymes: Recent Labs  Lab 03/27/21 1430  CKTOTAL 92   No results for input(s): PROBNP in the last 8760 hours. Coagulation Profile: No results for input(s): INR, PROTIME in the last 168 hours. Thyroid Function Tests: No results for input(s): TSH, T4TOTAL, FREET4, T3FREE, THYROIDAB in the last 72 hours. Lipid Profile: No results for input(s): CHOL, HDL, LDLCALC, TRIG, CHOLHDL, LDLDIRECT in the last 72 hours. Anemia Panel: No results for input(s): VITAMINB12, FOLATE, FERRITIN, TIBC, IRON, RETICCTPCT in the last 72 hours. Urine analysis:    Component Value Date/Time   COLORURINE ORANGE (A) 03/26/2021 0816   APPEARANCEUR CLEAR 03/26/2021 0816   LABSPEC 1.025 03/26/2021 0816   PHURINE 6.0 03/26/2021 0816   GLUCOSEU NEGATIVE 03/26/2021 0816   HGBUR SMALL (A) 03/26/2021 0816   BILIRUBINUR NEGATIVE 03/26/2021 0816   KETONESUR NEGATIVE 03/26/2021 0816   PROTEINUR 30 (A) 03/26/2021 0816   NITRITE NEGATIVE 03/26/2021 0816   LEUKOCYTESUR NEGATIVE 03/26/2021 0816   Sepsis Labs: Invalid input(s): PROCALCITONIN, [place]  Microbiology: Recent Results (from the past 240 hour(s))  Resp Panel by RT-PCR (Flu A&B, Covid) Nasopharyngeal Swab     Status: Abnormal   Collection Time: 03/22/21  9:29 AM   Specimen: Nasopharyngeal Swab; Nasopharyngeal(NP) swabs in vial transport medium  Result Value Ref Range Status   SARS Coronavirus 2 by RT PCR NEGATIVE NEGATIVE Final    Comment:  (NOTE) SARS-CoV-2 target nucleic acids are NOT DETECTED.  The SARS-CoV-2 RNA is generally detectable in upper respiratory specimens during the acute phase of infection. The lowest concentration of SARS-CoV-2 viral copies this assay can detect is 138 copies/mL. A negative result does not preclude SARS-Cov-2 infection and should not be used as the sole basis for treatment or other patient management decisions. A negative result may occur with  improper specimen collection/handling, submission  of specimen other than nasopharyngeal swab, presence of viral mutation(s) within the areas targeted by this assay, and inadequate number of viral copies(<138 copies/mL). A negative result must be combined with clinical observations, patient history, and epidemiological information. The expected result is Negative.  Fact Sheet for Patients:  EntrepreneurPulse.com.au  Fact Sheet for Healthcare Providers:  IncredibleEmployment.be  This test is no t yet approved or cleared by the [place] FDA and  has been authorized for detection and/or diagnosis of SARS-CoV-2 by FDA under an Emergency Use Authorization (EUA). This EUA will remain  in effect (meaning this test can be used) for the duration of the COVID-19 declaration under Section 564(b)(1) of the Act, 21 U.S.C.section 360bbb-3(b)(1), unless the authorization is terminated  or revoked sooner.       Influenza A by PCR POSITIVE (A) NEGATIVE Final   Influenza B by PCR NEGATIVE NEGATIVE Final    Comment: (NOTE) The Xpert Xpress SARS-CoV-2/FLU/RSV plus assay is intended as an aid in the diagnosis of influenza from Nasopharyngeal swab specimens and should not be used as a sole basis for treatment. Nasal washings and aspirates are unacceptable for Xpert Xpress SARS-CoV-2/FLU/RSV testing.  Fact Sheet for Patients: EntrepreneurPulse.com.au  Fact Sheet for Healthcare  Providers: IncredibleEmployment.be  This test is not yet approved or cleared by the [place] FDA and has been authorized for detection and/or diagnosis of SARS-CoV-2 by FDA under an Emergency Use Authorization (EUA). This EUA will remain in effect (meaning this test can be used) for the duration of the COVID-19 declaration under Section 564(b)(1) of the Act, 21 U.S.C. section 360bbb-3(b)(1), unless the authorization is terminated or revoked.  Performed at [facility], [place] [address], [place], [place] [address]   Urine Culture     Status: Abnormal   Collection Time: 03/26/21  8:17 AM   Specimen: Urine, Clean Catch  Result Value Ref Range Status   Specimen Description URINE, CLEAN CATCH  Final   Special Requests   Final    NONE Performed at [facility], [place] [address], [place], [place] [address]    Culture MULTIPLE SPECIES PRESENT, SUGGEST RECOLLECTION (A)  Final   Report Status 03/28/2021 FINAL  Final  Culture, blood (routine x 2)     Status: None (Preliminary result)   Collection Time: 03/26/21  9:27 AM   Specimen: BLOOD RIGHT HAND  Result Value Ref Range Status   Specimen Description BLOOD RIGHT HAND  Final   Special Requests   Final    AEROBIC BOTTLE ONLY Blood Culture results may not be optimal due to an inadequate volume of blood received in culture bottles   Culture   Final    NO GROWTH 4 DAYS Performed at [facility], [place] [address], [place], [place] [address]    Report Status PENDING  Incomplete  Culture, blood (routine x 2)     Status: None (Preliminary result)   Collection Time: 03/26/21 10:33 AM   Specimen: BLOOD RIGHT HAND  Result Value Ref Range Status   Specimen Description BLOOD RIGHT HAND  Final   Special Requests   Final    AEROBIC BOTTLE ONLY Blood Culture results may not be optimal due to an inadequate volume of blood received in culture bottles   Culture   Final    NO GROWTH 4 DAYS Performed at [facility], [place] [address], [place], [place] [address]    Report Status PENDING  Incomplete    Radiology Studies:  DG Chest 2 View  Result Date: 03/29/2021 CLINICAL DATA:  Chest pain EXAM: CHEST - 2 VIEW COMPARISON:  03/27/2021 FINDINGS: Transverse diameter of heart is increased. There are no signs of alveolar pulmonary edema. There is blunting of both lateral CP angles. There is no pneumothorax. Skin folds are overlying the lateral aspect of both lower lung fields. Surgical clips are seen in the left axilla. IMPRESSION: Cardiomegaly. There are no signs of pulmonary edema or focal pulmonary consolidation. There is blunting of both lateral CP angles suggesting possible small effusions. Electronically Signed   By: Elmer Picker M.D.   On: 03/29/2021 12:50       Ilina Xu T. [place]  If 7PM-7AM, please contact night-coverage [email or web address] 03/30/2021, 10:51 AM

## 2021-03-31 LAB — CBC
HCT: 27.9 % — ABNORMAL LOW (ref 36.0–46.0)
Hemoglobin: 9.6 g/dL — ABNORMAL LOW (ref 12.0–15.0)
MCH: 25.6 pg — ABNORMAL LOW (ref 26.0–34.0)
MCHC: 34.4 g/dL (ref 30.0–36.0)
MCV: 74.4 fL — ABNORMAL LOW (ref 80.0–100.0)
Platelets: 344 10*3/uL (ref 150–400)
RBC: 3.75 MIL/uL — ABNORMAL LOW (ref 3.87–5.11)
RDW: 14.9 % (ref 11.5–15.5)
WBC: 14.7 10*3/uL — ABNORMAL HIGH (ref 4.0–10.5)
nRBC: 0 % (ref 0.0–0.2)

## 2021-03-31 LAB — RENAL FUNCTION PANEL
Albumin: 1.9 g/dL — ABNORMAL LOW (ref 3.5–5.0)
Anion gap: 12 (ref 5–15)
BUN: 25 mg/dL — ABNORMAL HIGH (ref 8–23)
CO2: 21 mmol/L — ABNORMAL LOW (ref 22–32)
Calcium: 8.1 mg/dL — ABNORMAL LOW (ref 8.9–10.3)
Chloride: 102 mmol/L (ref 98–111)
Creatinine, Ser: 0.64 mg/dL (ref 0.44–1.00)
GFR, Estimated: >60 mL/min (ref >60)
Glucose, Bld: 118 mg/dL — ABNORMAL HIGH (ref 70–99)
Phosphorus: 2.2 mg/dL — ABNORMAL LOW (ref 2.5–4.6)
Potassium: 4.2 mmol/L (ref 3.5–5.1)
Sodium: 135 mmol/L (ref 135–145)

## 2021-03-31 LAB — CULTURE, BLOOD (ROUTINE X 2)
Culture: NO GROWTH
Culture: NO GROWTH

## 2021-03-31 LAB — MAGNESIUM: Magnesium: 1.9 mg/dL (ref 1.7–2.4)

## 2021-03-31 LAB — HEPATIC FUNCTION PANEL
ALT: 46 U/L — ABNORMAL HIGH (ref 0–44)
AST: 50 U/L — ABNORMAL HIGH (ref 15–41)
Albumin: 1.8 g/dL — ABNORMAL LOW (ref 3.5–5.0)
Alkaline Phosphatase: 84 U/L (ref 38–126)
Bilirubin, Direct: 0.1 mg/dL (ref 0.0–0.2)
Indirect Bilirubin: 0.4 mg/dL (ref 0.3–0.9)
Total Bilirubin: 0.5 mg/dL (ref 0.3–1.2)
Total Protein: 6.3 g/dL — ABNORMAL LOW (ref 6.5–8.1)

## 2021-03-31 NOTE — Progress Notes (Signed)
PROGRESS NOTE  Nicole Rush KDT:[id number] DOB: Apr 17, 1941   PCP: Lin Landsman, MD  Patient is from: Home  DOA: 03/21/2021 LOS: 32  Chief complaints:  Chief Complaint  Patient presents with   Chest Pain     Brief Narrative / Interim history: 80 year old F with PMH of asthma, cancer, HTN, CVA and recent flu infection presenting with 1 to 2 weeks of chest pain with associated dyspnea, nausea and vomiting, and admitted with pericarditis with trivial pericardial effusion and 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  She was febrile with significant leukocytosis.  CXR with poor inspiration but no active disease.  EKG with diffuse ST changes.  Cardiology consulted.  TTE with LVEF of 65 to 70%, indeterminate DD, moderate TVR and trivial pericardial effusion.  Patient was started on prednisone and colchicine.  Spiked another fever the morning of 12/6.  Blood cultures NGTD.  Urine culture with multiple species.  Subjective: Seen and examined earlier this morning.  No major events overnight of this morning.  No complaints.  She denies chest pain, dyspnea, GI or UTI symptoms.  No further fever.  Leukocytosis improved.  Objective: Vitals:   03/30/21 1547 03/30/21 1943 03/30/21 2333 03/31/21 0311  BP: 115/80 114/72 114/74 128/81  Pulse: 85 81 76 81  Resp: 19 17 19  (!) 22  Temp: 97.6 F (36.4 C) 98.7 F (37.1 C) 98.4 F (36.9 C) 98.5 F (36.9 C)  TempSrc: Oral Oral Oral Oral  SpO2: (!) 80% 94% 96% 93%  Weight:      Height:        Intake/Output Summary (Last 24 hours) at 03/31/2021 0948 Last data filed at 03/31/2021 9983 Gross per 24 hour  Intake 480 ml  Output 300 ml  Net 180 ml   Filed Weights   03/23/21 1800  Weight: 43.3 kg    Examination:  GENERAL: Frail looking elderly female.  No apparent distress. HEENT: MMM.  Vision and hearing grossly intact.  NECK: Supple.  No apparent JVD.  RESP: 93% on RA.  No IWOB.  Fair aeration bilaterally. CVS:  RRR. Heart sounds normal.  ABD/GI/GU: BS+. Abd  soft, NTND.  MSK/EXT:  Moves extremities.  Significant muscle mass and subcu fat loss. SKIN: no apparent skin lesion or wound NEURO: Awake and alert. Oriented appropriately.  No apparent focal neuro deficit. PSYCH: Calm. Normal affect.   Procedures:  None  Microbiology summarized: 03/22/2021-Influenza A positive 03/22/2021-COVID-19 PCR negative. 03/26/2021-blood cultures NGTD. 03/26/2021-urine culture pending.  Assessment & Plan: Pericarditis with small Pericardial Effusion-felt to be postviral from influenza A infection. TTE as above.  CRP 36>> 10.  ESR 108>>70.  CT C/A/P raises concern for pericarditis.  Significant leukocytosis but on a steroid.  Fever curve downtrending. -Continue colchicine and prednisone   Influenza A: Initially diagnosed on 11/27. -Completed course of Tamiflu. -Supportive care with incentive telemetry, OOB, mucolytic's and antitussive  Fever: 102.8 on 12/6, 101 on 12/8 and 100.4 on 12/9.   Blood culture, CXR x2 and UA unrevealing.  Urine culture with multiple species.  Could be from her pericarditis but rising leukocytosis concerning although could be from steroid. No further fever since 12/9.  Leukocytosis improving. -Continue monitoring -Continue IV ceftriaxone empirically for a total of 5 days  Acute respiratory failure with hypoxia: Likely from influenza A and possible atelectasis.  Resolved. -Already on prednisone for pericarditis. -Continue Xopenex and Atrovent every 6 hours -Mucolytic's and antitussive, incentive spirometry, OOB/PT/OT. -PCCM recommended outpatient follow-up for calcified granuloma in left lung.  Left-sided chest pain: Mainly with  cough.  Likely from pericarditis.  CXR unrevealing.  Resolved. -Antibiotics as above.  Sinus tachycardia: Resolved. -Continue low-dose metoprolol.   Hyponatremia/hypophosphatemia/hypokalemia/hypomagnesemia: Resolved.  Elevated liver enzymes: Pattern suggests rhabdo or EtOH but CK within normal. Recent Labs   Lab 03/26/21 0303 03/27/21 0544 03/28/21 0313 03/29/21 0426 03/30/21 0040 03/31/21 0053  AST 106* 111* 96* 57*  --  50*  ALT 41 55* 62* 50*  --  46*  ALKPHOS 96 80 78 94  --  84  BILITOT 0.7 0.8 0.9 0.9  --  0.5  PROT 6.9 5.8* 6.0* 6.3*  --  6.3*  ALBUMIN 2.1* 1.8* 1.8*  1.8* 1.9* 1.9* 1.8*  1.9*  -Continue holding Crestor -Recheck intermittently.  Essential hypertension: Normotensive. -Continue holding home losartan and HCTZ -Low-dose metoprolol as above  Microcytic anemia: H&H relatively stable.  Anemia panel consistent with anemia of chronic disease. Recent Labs    03/22/21 0554 03/23/21 0100 03/24/21 0149 03/25/21 0029 03/26/21 0303 03/27/21 0544 03/28/21 0313 03/29/21 0426 03/30/21 0109 03/31/21 0053  HGB 9.6* 10.2* 9.8* 9.1* 12.1 9.8* 10.1* 10.5* 9.6* 9.6*  -Monitor intermittently  Urinary Retention?  Resolved. -Intermittent bladder scan as needed   Metabolic Acidosis: Improved. -Monitor intermittently   Thrombocytosis: Resolved  Hyperbilirubinemia: Resolved.   Constipation -Bowel regimen  Generalized weakness/physical deconditioning  -PT/OT  Leukocytosis/bandemia: Improved. -Continue monitoring  Severe malnutrition: As evidenced by low BMI for age, muscle mass and subcu fat loss and poor p.o. intake. Body mass index is 18.04 kg/m.  -Consult dietitian       DVT prophylaxis:  heparin injection 5,000 Units Start: 03/22/21 1400 SCDs Start: 03/22/21 0304  Code Status: Full code Family Communication: Updated patient's daughter, [name] over the phone on 12/7. Level of care: Telemetry Medical Status is: Inpatient  Remains inpatient appropriate because: Safe disposition/SNF bed.  Medically stable for discharge   Final disposition: SNF    Consultants:  Cardiology-signed off   Sch Meds:  Scheduled Meds:  colchicine  0.6 mg Oral Daily   feeding supplement  237 mL Oral BID BM   guaiFENesin  1,200 mg Oral BID   heparin  5,000 Units  Subcutaneous Q8H   loratadine  10 mg Oral Daily   melatonin  5 mg Oral QHS   metoprolol tartrate  12.5 mg Oral BID   pantoprazole  40 mg Oral Daily   polyethylene glycol  17 g Oral BID   predniSONE  15 mg Oral Q breakfast   Followed by   Derrill Memo ON 04/07/2021] predniSONE  10 mg Oral Q breakfast   Followed by   Derrill Memo ON 04/14/2021] predniSONE  5 mg Oral Q breakfast   Followed by   Derrill Memo ON 04/21/2021] predniSONE  2.5 mg Oral Q breakfast   senna-docusate  1 tablet Oral BID   Continuous Infusions:  cefTRIAXone (ROCEPHIN)  IV 1 g (03/30/21 1531)   PRN Meds:.acetaminophen **OR** acetaminophen, hydrOXYzine, ipratropium, levalbuterol, ondansetron **OR** ondansetron (ZOFRAN) IV, oxyCODONE  Antimicrobials: Anti-infectives (From admission, onward)    Start     Dose/Rate Route Frequency Ordered Stop   03/29/21 1400  cefTRIAXone (ROCEPHIN) 1 g in sodium chloride 0.9 % 100 mL IVPB        1 g 200 mL/hr over 30 Minutes Intravenous Every 24 hours 03/29/21 1221 04/03/21 1359   03/22/21 1000  oseltamivir (TAMIFLU) capsule 30 mg       Note to Pharmacy: Tamiflu 30 mg BID for CrCl <  60 mL/min   30 mg Oral 2 times daily  03/22/21 0304 03/23/21 2208        I have personally reviewed the following labs and images: CBC: Recent Labs  Lab 03/25/21 0029 03/26/21 0303 03/27/21 0544 03/28/21 0313 03/29/21 0426 03/30/21 0109 03/31/21 0053  WBC 11.2* 9.3 15.3* 15.0* 19.1* 17.2* 14.7*  NEUTROABS 9.6* 7.9* 13.2* 12.6*  --   --   --   HGB 9.1* 12.1 9.8* 10.1* 10.5* 9.6* 9.6*  HCT 26.7* 35.1* 28.3* 29.6* 30.4* 28.3* 27.9*  MCV 75.9* 74.5* 74.1* 74.4* 73.8* 74.9* 74.4*  PLT 406* 408* 331 288 328 343 344   BMP &GFR Recent Labs  Lab 03/26/21 0303 03/27/21 0544 03/28/21 0313 03/30/21 0040 03/30/21 0109 03/31/21 0053  NA 134* 137 136 134*  --  135  K 4.4 3.3* 4.3 4.7  --  4.2  CL 100 103 105 104  --  102  CO2 22 23 22  19*  --  21*  GLUCOSE 88 85 87 116*  --  118*  BUN 34* 24* 21 30*  --  25*   CREATININE 0.92 0.66 0.70 0.67  --  0.64  CALCIUM 8.6* 7.7* 8.2* 8.3*  --  8.1*  MG 2.0 1.7 2.1  --  2.1 1.9  PHOS 1.9* 2.8 2.1* 2.9  --  2.2*   Estimated Creatinine Clearance: 37.1 mL/min (by C-G formula based on SCr of 0.64 mg/dL). Liver & Pancreas: Recent Labs  Lab 03/26/21 0303 03/27/21 0544 03/28/21 0313 03/29/21 0426 03/30/21 0040 03/31/21 0053  AST 106* 111* 96* 57*  --  50*  ALT 41 55* 62* 50*  --  46*  ALKPHOS 96 80 78 94  --  84  BILITOT 0.7 0.8 0.9 0.9  --  0.5  PROT 6.9 5.8* 6.0* 6.3*  --  6.3*  ALBUMIN 2.1* 1.8* 1.8*  1.8* 1.9* 1.9* 1.8*  1.9*   No results for input(s): LIPASE, AMYLASE in the last 168 hours.  No results for input(s): AMMONIA in the last 168 hours. Diabetic: No results for input(s): HGBA1C in the last 72 hours. No results for input(s): GLUCAP in the last 168 hours.  Cardiac Enzymes: Recent Labs  Lab 03/27/21 1430  CKTOTAL 92   No results for input(s): PROBNP in the last 8760 hours. Coagulation Profile: No results for input(s): INR, PROTIME in the last 168 hours. Thyroid Function Tests: No results for input(s): TSH, T4TOTAL, FREET4, T3FREE, THYROIDAB in the last 72 hours. Lipid Profile: No results for input(s): CHOL, HDL, LDLCALC, TRIG, CHOLHDL, LDLDIRECT in the last 72 hours. Anemia Panel: No results for input(s): VITAMINB12, FOLATE, FERRITIN, TIBC, IRON, RETICCTPCT in the last 72 hours. Urine analysis:    Component Value Date/Time   COLORURINE ORANGE (A) 03/26/2021 0816   APPEARANCEUR CLEAR 03/26/2021 0816   LABSPEC 1.025 03/26/2021 0816   PHURINE 6.0 03/26/2021 0816   GLUCOSEU NEGATIVE 03/26/2021 0816   HGBUR SMALL (A) 03/26/2021 0816   BILIRUBINUR NEGATIVE 03/26/2021 0816   KETONESUR NEGATIVE 03/26/2021 0816   PROTEINUR 30 (A) 03/26/2021 0816   NITRITE NEGATIVE 03/26/2021 0816   LEUKOCYTESUR NEGATIVE 03/26/2021 0816   Sepsis Labs: Invalid input(s): PROCALCITONIN, [place]  Microbiology: Recent Results (from the past  240 hour(s))  Resp Panel by RT-PCR (Flu A&B, Covid) Nasopharyngeal Swab     Status: Abnormal   Collection Time: 03/22/21  9:29 AM   Specimen: Nasopharyngeal Swab; Nasopharyngeal(NP) swabs in vial transport medium  Result Value Ref Range Status   SARS Coronavirus 2 by RT PCR NEGATIVE NEGATIVE Final  Comment: (NOTE) SARS-CoV-2 target nucleic acids are NOT DETECTED.  The SARS-CoV-2 RNA is generally detectable in upper respiratory specimens during the acute phase of infection. The lowest concentration of SARS-CoV-2 viral copies this assay can detect is 138 copies/mL. A negative result does not preclude SARS-Cov-2 infection and should not be used as the sole basis for treatment or other patient management decisions. A negative result may occur with  improper specimen collection/handling, submission of specimen other than nasopharyngeal swab, presence of viral mutation(s) within the areas targeted by this assay, and inadequate number of viral copies(<138 copies/mL). A negative result must be combined with clinical observations, patient history, and epidemiological information. The expected result is Negative.  Fact Sheet for Patients:  EntrepreneurPulse.com.au  Fact Sheet for Healthcare Providers:  IncredibleEmployment.be  This test is no t yet approved or cleared by the [place] FDA and  has been authorized for detection and/or diagnosis of SARS-CoV-2 by FDA under an Emergency Use Authorization (EUA). This EUA will remain  in effect (meaning this test can be used) for the duration of the COVID-19 declaration under Section 564(b)(1) of the Act, 21 U.S.C.section 360bbb-3(b)(1), unless the authorization is terminated  or revoked sooner.       Influenza A by PCR POSITIVE (A) NEGATIVE Final   Influenza B by PCR NEGATIVE NEGATIVE Final    Comment: (NOTE) The Xpert Xpress SARS-CoV-2/FLU/RSV plus assay is intended as an aid in the diagnosis of  influenza from Nasopharyngeal swab specimens and should not be used as a sole basis for treatment. Nasal washings and aspirates are unacceptable for Xpert Xpress SARS-CoV-2/FLU/RSV testing.  Fact Sheet for Patients: EntrepreneurPulse.com.au  Fact Sheet for Healthcare Providers: IncredibleEmployment.be  This test is not yet approved or cleared by the [place] FDA and has been authorized for detection and/or diagnosis of SARS-CoV-2 by FDA under an Emergency Use Authorization (EUA). This EUA will remain in effect (meaning this test can be used) for the duration of the COVID-19 declaration under Section 564(b)(1) of the Act, 21 U.S.C. section 360bbb-3(b)(1), unless the authorization is terminated or revoked.  Performed at [facility], [place] [address] [place], [place] [address]   Urine Culture     Status: Abnormal   Collection Time: 03/26/21  8:17 AM   Specimen: Urine, Clean Catch  Result Value Ref Range Status   Specimen Description URINE, CLEAN CATCH  Final   Special Requests   Final    NONE Performed at [facility], [place] [address], [place], [place] [address]    Culture MULTIPLE SPECIES PRESENT, SUGGEST RECOLLECTION (A)  Final   Report Status 03/28/2021 FINAL  Final  Culture, blood (routine x 2)     Status: None   Collection Time: 03/26/21  9:27 AM   Specimen: BLOOD RIGHT HAND  Result Value Ref Range Status   Specimen Description BLOOD RIGHT HAND  Final   Special Requests   Final    AEROBIC BOTTLE ONLY Blood Culture results may not be optimal due to an inadequate volume of blood received in culture bottles   Culture   Final    NO GROWTH 5 DAYS Performed at [facility], [place] [address] [place], [place] [address]    Report Status 03/31/2021 FINAL  Final  Culture, blood (routine x 2)     Status: None   Collection Time: 03/26/21 10:33 AM   Specimen: BLOOD RIGHT HAND  Result Value Ref Range Status   Specimen  Description BLOOD RIGHT HAND  Final   Special Requests   Final    AEROBIC BOTTLE ONLY Blood Culture results may not be optimal due to an inadequate volume of blood received in culture bottles   Culture   Final    NO GROWTH 5 DAYS Performed at [facility], [place] [address] [place], [place] [address]    Report Status 03/31/2021 FINAL  Final    Radiology Studies: No results found.     Marri Mcneff T. [place]  If 7PM-7AM, please contact night-coverage [email or web address] 03/31/2021, 9:48 AM

## 2021-04-01 ENCOUNTER — Inpatient Hospital Stay (HOSPITAL_COMMUNITY): Payer: Medicare HMO

## 2021-04-01 DIAGNOSIS — E43 Unspecified severe protein-calorie malnutrition: Secondary | ICD-10-CM | POA: Insufficient documentation

## 2021-04-01 DIAGNOSIS — R079 Chest pain, unspecified: Secondary | ICD-10-CM | POA: Diagnosis not present

## 2021-04-01 DIAGNOSIS — E46 Unspecified protein-calorie malnutrition: Secondary | ICD-10-CM | POA: Insufficient documentation

## 2021-04-01 DIAGNOSIS — I314 Cardiac tamponade: Secondary | ICD-10-CM

## 2021-04-01 DIAGNOSIS — I3139 Other pericardial effusion (noninflammatory): Secondary | ICD-10-CM

## 2021-04-01 DIAGNOSIS — I309 Acute pericarditis, unspecified: Secondary | ICD-10-CM

## 2021-04-01 LAB — ECHOCARDIOGRAM LIMITED
Height: 61 in
S' Lateral: 1.4 cm
Weight: 1527.35 oz

## 2021-04-01 LAB — TROPONIN I (HIGH SENSITIVITY)
Troponin I (High Sensitivity): 20 ng/L — ABNORMAL HIGH (ref <18)
Troponin I (High Sensitivity): 627 ng/L (ref <18)

## 2021-04-01 LAB — CBC
HCT: 29.2 % — ABNORMAL LOW (ref 36.0–46.0)
Hemoglobin: 9.9 g/dL — ABNORMAL LOW (ref 12.0–15.0)
MCH: 25.4 pg — ABNORMAL LOW (ref 26.0–34.0)
MCHC: 33.9 g/dL (ref 30.0–36.0)
MCV: 74.9 fL — ABNORMAL LOW (ref 80.0–100.0)
Platelets: 402 10*3/uL — ABNORMAL HIGH (ref 150–400)
RBC: 3.9 MIL/uL (ref 3.87–5.11)
RDW: 15 % (ref 11.5–15.5)
WBC: 14.9 10*3/uL — ABNORMAL HIGH (ref 4.0–10.5)
nRBC: 0.1 % (ref 0.0–0.2)

## 2021-04-01 LAB — TYPE AND SCREEN
ABO/RH(D): A POS
Antibody Screen: NEGATIVE

## 2021-04-01 LAB — LACTIC ACID, PLASMA
Lactic Acid, Venous: 4.2 mmol/L (ref 0.5–1.9)
Lactic Acid, Venous: 4.9 mmol/L (ref 0.5–1.9)
Lactic Acid, Venous: 3.2 mmol/L (ref 0.5–1.9)

## 2021-04-01 LAB — BRAIN NATRIURETIC PEPTIDE: B Natriuretic Peptide: 101 pg/mL — ABNORMAL HIGH (ref 0.0–100.0)

## 2021-04-01 LAB — ABO/RH: ABO/RH(D): A POS

## 2021-04-01 MED ORDER — FENTANYL CITRATE PF 50 MCG/ML IJ SOSY
12.5000 ug | PREFILLED_SYRINGE | INTRAMUSCULAR | Status: DC | PRN
  Administered 2021-04-01 – 2021-04-02 (×2): 12.5 ug via INTRAVENOUS
  Filled 2021-04-01 (×2): qty 1

## 2021-04-01 MED ORDER — ENOXAPARIN SODIUM 40 MG/0.4ML IJ SOSY
40.0000 mg | PREFILLED_SYRINGE | INTRAMUSCULAR | Status: DC
  Administered 2021-04-01: 40 mg via SUBCUTANEOUS
  Filled 2021-04-01: qty 0.4

## 2021-04-01 MED ORDER — ACETAMINOPHEN 500 MG PO TABS
1000.0000 mg | ORAL_TABLET | Freq: Once | ORAL | Status: AC
  Administered 2021-04-01: 1000 mg via ORAL
  Filled 2021-04-01: qty 2

## 2021-04-01 MED ORDER — ENOXAPARIN SODIUM 30 MG/0.3ML IJ SOSY
30.0000 mg | PREFILLED_SYRINGE | INTRAMUSCULAR | Status: DC
  Filled 2021-04-01: qty 0.3

## 2021-04-01 NOTE — Progress Notes (Signed)
Date and time results received: 04/01/21 1240  (use smartphrase ".now" to insert current time)  Test: troponin  Critical Value: 627  Name of Provider Notified: Dr. Cyndia Skeeters  Orders Received? Or Actions Taken?: Primary MD to send consult Cardiology.

## 2021-04-01 NOTE — Progress Notes (Signed)
PT Cancellation Note  Patient Details Name: Nicole Rush MRN: [id number] DOB: 03-11-1941   Cancelled Treatment:    Reason Eval/Treat Not Completed: Medical issues which prohibited therapy; Cardiology MD request hold on PT treatment today.  Mabeline Caras, PT, DPT Acute Rehabilitation Services  Pager [phone] Office [place] 04/01/2021, 1:55 PM

## 2021-04-01 NOTE — Discharge Instructions (Signed)
[facility] Stay Proper nutrition can help your body recover from illness and injury.   Foods and beverages high in protein, vitamins, and minerals help rebuild muscle loss, promote healing, & reduce fall risk.   In addition to eating healthy foods, a nutrition shake is an easy, delicious way to get the nutrition you need during and after your hospital stay  It is recommended that you continue to drink 2 bottles per day of: Ensure for at least 1 month (30 days) after your hospital stay   Tips for adding a nutrition shake into your routine: As allowed, drink one with vitamins or medications instead of water or juice Enjoy one as a tasty mid-morning or afternoon snack Drink cold or make a milkshake out of it Drink one instead of milk with cereal or snacks Use as a coffee creamer   Available at the following grocery stores and pharmacies:           * [place] [phone]            For COUPONS visit: www.ensure.com/join or [email or web address]-[email or web address]   Suggested Substitutions Ensure Plus = Boost Plus = Carnation Breakfast Essentials = Boost Compact

## 2021-04-01 NOTE — Progress Notes (Addendum)
[address]       [place] [address]             [phone]        Evalynn Faydene Chichester [place] Medical Record #[id number] Date of Birth: 12/23/1940  Referring: [place] Primary Care: Lin Landsman, MD Primary Cardiologist:None  Chief Complaint:    Chief Complaint  Patient presents with   Chest Pain   History of Present Illness:      Ms. Dohmen is an 80 yo female with history of asthma, HTN, stroke, and h/o breast cancer.  She also recently had the flu back at the end of November.  She presented to the ED with a 1-2 week complaint of chest pain and shortness of breath.  The patient's symptoms were intermittent, she was unable to determine what made the symptoms worse.  She felt like ice made it better.  The patient also has experienced nausea and vomiting 1-2 times per day, and appeared to be due to undigested food.  The patient has also lost weight recently but also has a decreased appetite.  Cardiac workup in the ED was negative for ACS.  She was felt to likely have Pericarditis and was started on Colchicine and pain medications.  CT scan was obtained and showed evidence of a small pericardial effusion.  Cardiology consultation was requested and they did not feel patient had ACS presentation.  They were in agreement with review of imaging that the patient's pleural effusion was small and overall her symptoms were improving.  They recommended continuing colchicine and they signed off.  Formal Echocardiogram was obtained and showed trivial pericardial effusion.  The patient was getting ready for discharge, however she spiked a fever of 102.  Full workup was obtained and there was no definitive source of infection.  She again developed intermittent chest discomfort.  Repeat Echocardiogram was obtained and showed progression of her pericardial effusion.  The effusion was now large with concern for tamponade.  Cardiology was re-consulted and did not feel pericardiocentesis  could be safely performed.  Cardiothoracic surgery consultation has been obtained for pericardial window creation.  Currently the patient continues to have episodic of chest pain.  The pain is worse with laying down.  She asks that her family be called by the surgeon to discuss surgery.  Current Activity/ Functional Status: Patient was independent with mobility/ambulation, transfers, ADL's, IADL's.   Zubrod Score: At the time of surgery this patient's most appropriate activity status/level should be described as: []     0    Normal activity, no symptoms []     1    Restricted in physical strenuous activity but ambulatory, able to do out light work []     2    Ambulatory and capable of self care, unable to do work activities, up and about                 more than 50%  Of the time                            []     3    Only limited self care, in bed greater than 50% of waking hours []     4    Completely disabled, no self care, confined to bed or chair []     5    Moribund  Past Medical History:  Diagnosis Date   Asthma    Cancer (  [place]) 03/2019   left breast DCIS   Hypertension    Stroke ([facility])    per pt no defecits    Past Surgical History:  Procedure Laterality Date   BREAST LUMPECTOMY WITH AXILLARY LYMPH NODE BIOPSY Left 04/06/2019   Procedure: LEFT BREAST LUMPECTOMY WITH LEFT SENTINEL LYMPH NODE BIOPSY;  Surgeon: [name], MD;  Location: [facility];  Service: General;  Laterality: Left;   BREAST SURGERY     RE-EXCISION OF BREAST LUMPECTOMY Left 05/04/2019   Procedure: RE-EXCISION OF LEFT BREAST LUMPECTOMY;  Surgeon: [name], MD;  Location: [facility];  Service: General;  Laterality: Left;    Social History   Tobacco Use  Smoking Status Former  Smokeless Tobacco Never    Social History   Substance and Sexual Activity  Alcohol Use No     No Known Allergies  Current Facility-Administered Medications  Medication Dose Route Frequency  Provider Last Rate Last Admin   acetaminophen (TYLENOL) tablet 650 mg  650 mg Oral Q6H PRN Zierle-Ghosh, Asia B, DO   650 mg at 04/01/21 0430   Or   acetaminophen (TYLENOL) suppository 650 mg  650 mg Rectal Q6H PRN Zierle-Ghosh, Asia B, DO       cefTRIAXone (ROCEPHIN) 1 g in sodium chloride 0.9 % 100 mL IVPB  1 g Intravenous Q24H Gonfa, Taye T, MD 200 mL/hr at 04/01/21 1504 1 g at 04/01/21 1504   colchicine tablet 0.6 mg  0.6 mg Oral Daily Zierle-Ghosh, Asia B, DO   0.6 mg at 04/01/21 0941   [START ON 04/02/2021] enoxaparin (LOVENOX) injection 30 mg  30 mg Subcutaneous Q24H Gonfa, Taye T, MD       feeding supplement (ENSURE ENLIVE / ENSURE PLUS) liquid 237 mL  237 mL Oral BID BM Zierle-Ghosh, Asia B, DO   237 mL at 04/01/21 1510   fentaNYL (SUBLIMAZE) injection 12.5 mcg  12.5 mcg Intravenous Q2H PRN Wendee Beavers T, MD   12.5 mcg at 04/01/21 0956   guaiFENesin (MUCINEX) 12 hr tablet 1,200 mg  1,200 mg Oral BID Raiford Noble Latif, DO   1,200 mg at 04/01/21 9211   hydrOXYzine (ATARAX) tablet 10 mg  10 mg Oral TID PRN Wendee Beavers T, MD   10 mg at 04/01/21 0630   ipratropium (ATROVENT) nebulizer solution 0.5 mg  0.5 mg Nebulization Q6H PRN Mercy Riding, MD       levalbuterol (XOPENEX) nebulizer solution 0.63 mg  0.63 mg Nebulization Q6H PRN Mercy Riding, MD       loratadine (CLARITIN) tablet 10 mg  10 mg Oral Daily Zierle-Ghosh, Asia B, DO   10 mg at 04/01/21 0942   melatonin tablet 5 mg  5 mg Oral QHS Wendee Beavers T, MD   5 mg at 03/31/21 2137   ondansetron (ZOFRAN) tablet 4 mg  4 mg Oral Q6H PRN Zierle-Ghosh, Asia B, DO   4 mg at 03/22/21 1341   Or   ondansetron (ZOFRAN) injection 4 mg  4 mg Intravenous Q6H PRN Zierle-Ghosh, Asia B, DO   4 mg at 03/28/21 1140   oxyCODONE (Oxy IR/ROXICODONE) immediate release tablet 5 mg  5 mg Oral Q4H PRN Zierle-Ghosh, Asia B, DO   5 mg at 04/01/21 1136   pantoprazole (PROTONIX) EC tablet 40 mg  40 mg Oral Daily Raiford Noble [place], DO   40 mg at 04/01/21 0942    polyethylene glycol (MIRALAX / GLYCOLAX) packet 17 g  17 g Oral BID  Raiford Noble [place], [place]   17 g at 03/30/21 2104   predniSONE (DELTASONE) tablet 15 mg  15 mg Oral Q breakfast Wendee Beavers T, MD   15 mg at 04/01/21 0630   Followed by   Derrill Memo ON 04/07/2021] predniSONE (DELTASONE) tablet 10 mg  10 mg Oral Q breakfast Mercy Riding, MD       Followed by   Derrill Memo ON 04/14/2021] predniSONE (DELTASONE) tablet 5 mg  5 mg Oral Q breakfast Wendee Beavers T, MD       Followed by   Derrill Memo ON 04/21/2021] predniSONE (DELTASONE) tablet 2.5 mg  2.5 mg Oral Q breakfast Mercy Riding, MD       senna-docusate (Senokot-S) tablet 1 tablet  1 tablet Oral BID Raiford Noble [place], DO   1 tablet at 04/01/21 8850    Medications Prior to Admission  Medication Sig Dispense Refill Last Dose   acetaminophen (TYLENOL) 500 MG tablet Take 500 mg by mouth every 6 (six) hours as needed for mild pain, fever or headache.   Past Week   anastrozole (ARIMIDEX) 1 MG tablet Take 1 tablet (1 mg total) by mouth daily. 90 tablet 3 Past Week   DM-Phenylephrine-Acetaminophen (VICKS DAYQUIL COLD & FLU PO) Take 1 Dose by mouth 3 (three) times daily as needed (cough/fever/cold symptoms).   Past Week   Multiple Vitamin (MULTIVITAMIN WITH MINERALS) TABS tablet Take 1 tablet by mouth daily.   Past Week   MYRBETRIQ 25 MG TB24 tablet Take 25 mg by mouth daily.   Past Week   oseltamivir (TAMIFLU) 75 MG capsule Take 1 capsule (75 mg total) by mouth every 12 (twelve) hours. 10 capsule 0 Past Week    Family History  Problem Relation Age of Onset   Breast cancer Mother    Lung cancer Father      Review of Systems:   ROS    Cardiac Review of Systems: Y or  [    ]= no  Chest Pain [  Y  ]  Resting SOB [ Y  ] Exertional SOB  [  ]  Orthopnea [  ]   Pedal Edema [ N  ]    Palpitations [ N ] Syncope  [  ]   Presyncope [   ]  General Review of Systems: [Y] = yes [  ]=no Constitional: recent weight change [ Y ]; anorexia [Y  ]; fatigue [  ]; nausea [   ]; night sweats [  ]; fever [  ]; or chills [  ]       patient appears malnourished                                                        Dental: Last Dentist visit:   Eye : blurred vision [  ]; diplopia [   ]; vision changes [  ];  Amaurosis fugax[  ]; Resp: cough [  ];  wheezing[  ];  hemoptysis[ N ]; shortness of breath[  ]; paroxysmal nocturnal dyspnea[  ]; dyspnea on exertion[  ]; or orthopnea[  ];  GI:  gallstones[  ], vomiting[ Y, resolved ];  dysphagia[  ]; melena[  ];  hematochezia [  ]; heartburn[  ];   Hx of  Colonoscopy[  ]; GU: kidney stones [  ];  hematuria[  ];   dysuria [  ];  nocturia[  ];  history of     obstruction [  ]; urinary frequency [  ]             Skin: rash, swelling[N  ];, hair loss[  ];  peripheral edema[ N ];  or itching[  ]; Musculosketetal: myalgias[  ];  joint swelling[  ];  joint erythema[  ];  joint pain[  ];  back pain[  ];  Heme/Lymph: bruising[  ];  bleeding[  ];  anemia[  ];  Neuro: TIA[  ];  headaches[  ];  stroke[Y  ];  vertigo[  ];  seizures[  ];   paresthesias[  ];  difficulty walking[  ];  Psych:depression[  ]; anxiety[  ];  Endocrine: diabetes[  ];  thyroid dysfunction[  ]; Physical Exam: BP 118/82 (BP Location: Right Arm)   Pulse (!) 106   Temp 98.4 F (36.9 C)   Resp (!) 21   Ht 5\' 1"  (1.549 m)   Wt 43.3 kg   SpO2 96%   BMI 18.04 kg/m   General appearance: alert, cooperative, and no distress Head: Normocephalic, without obvious abnormality, atraumatic Neck: JVD - 12-15 cm above sternal notch, no adenopathy, no carotid bruit, and supple, symmetrical, trachea midline Resp: clear to auscultation bilaterally Cardio: regular rate and rhythm GI: soft, non-tender; bowel sounds normal; no masses,  no organomegaly Extremities: extremities normal, atraumatic, no cyanosis or edema Neurologic: Grossly normal  Diagnostic Studies & Laboratory data:     Recent Radiology Findings:   DG Chest Port 1 View  Result Date: 04/01/2021 CLINICAL DATA:   Chest pain EXAM: PORTABLE CHEST 1 VIEW COMPARISON:  Chest radiograph dated March 29, 2021 FINDINGS: The heart is enlarged. No focal consolidation or pleural effusion. Surgical clips in the left chest wall. No acute osseous abnormality. IMPRESSION: 1.  Stable cardiomegaly. 2.  No focal consolidation or pleural effusion. Electronically Signed   By: Keane Police D.O.   On: 04/01/2021 10:06   ECHOCARDIOGRAM LIMITED  Result Date: 04/01/2021    ECHOCARDIOGRAM LIMITED REPORT   Patient Name:   EARNSTINE MEINDERS Date of Exam: 04/01/2021 Medical Rec #:  [id number]          Height:       61.0 in Accession #:    [id number]         Weight:       95.5 lb Date of Birth:  [birth year]           BSA:          1.379 m Patient Age:    7 years           BP:           118/82 mmHg Patient Gender: F                  HR:           106 bpm. Exam Location:  Inpatient Procedure: Limited Echo and Limited Color Doppler Indications:    Chest Pain  History:        Patient has prior history of Echocardiogram examinations, most                 recent 03/24/2021. Risk Factors:Dyslipidemia. Breast CA.  Sonographer:    Wenda Low Referring Phys: [id number] TAYE T GONFA IMPRESSIONS  1. There is a large pericardial effusion locately predominately over the anterior aspect of the RV (up to  1.6 cm). It measures posterior to the LV (0.9 cm). This is new from the prior echo. There is subtle RV collapse in diastole. Poor PW tracing of MV inflow. The IVC is dilated ~2.1 cm with >50 collapse. Hepatic vein PW is suggestive of late diastolic reversal with inspiration. The findings could represent early tamponade physiology especially given the change in size of the effusion. Clinical correlation is recommended. Large pericardial effusion. The pericardial effusion is anterior to the right ventricle.  2. Left ventricular ejection fraction, by estimation, is 70 to 75%. The left ventricle has hyperdynamic function. The left ventricle has no regional wall  motion abnormalities. There is mild concentric left ventricular hypertrophy.  3. Right ventricular systolic function is normal. The right ventricular size is normal.  4. The aortic valve is tricuspid. Comparison(s): Changes from prior study are noted. Effusion is larger compared with prior. FINDINGS  Left Ventricle: Left ventricular ejection fraction, by estimation, is 70 to 75%. The left ventricle has hyperdynamic function. The left ventricle has no regional wall motion abnormalities. The left ventricular internal cavity size was small. There is mild concentric left ventricular hypertrophy. Right Ventricle: The right ventricular size is normal. No increase in right ventricular wall thickness. Right ventricular systolic function is normal. Left Atrium: Left atrial size was normal in size. Right Atrium: Right atrial size was normal in size. Pericardium: There is a large pericardial effusion locately predominately over the anterior aspect of the RV (up to 1.6 cm). It measures posterior to the LV (0.9 cm). This is new from the prior echo. There is subtle RV collapse in diastole. Poor PW tracing of MV inflow. The IVC is dilated ~2.1 cm with >50 collapse. Hepatic vein PW is suggestive of late diastolic reversal with inspiration. The findings could represent early tamponade physiology especially given the change in size of the effusion. Clinical correlation is recommended. A large pericardial effusion is present. The pericardial effusion is anterior to the right ventricle. Aortic Valve: The aortic valve is tricuspid. Pulmonic Valve: The pulmonic valve was grossly normal. Aorta: The aortic root is normal in size and structure. IAS/Shunts: The atrial septum is grossly normal. LEFT VENTRICLE PLAX 2D LVIDd:         2.60 cm LVIDs:         1.40 cm LV PW:         1.20 cm LV IVS:        1.20 cm LVOT diam:     1.90 cm LVOT Area:     2.84 cm  LEFT ATRIUM             Index LA diam:        2.50 cm 1.81 cm/m LA Vol (A2C):   55.3 ml  40.09 ml/m LA Vol (A4C):   23.3 ml 16.89 ml/m LA Biplane Vol: 38.5 ml 27.91 ml/m   AORTA Ao Root diam: 2.70 cm  SHUNTS Systemic Diam: 1.90 cm Eleonore Chiquito MD Electronically signed by Eleonore Chiquito MD Signature Date/Time: 04/01/2021/4:06:42 PM    Final      I have independently reviewed the above radiologic studies and discussed with the patient   Recent Lab Findings: Lab Results  Component Value Date   WBC 14.9 (H) 04/01/2021   HGB 9.9 (L) 04/01/2021   HCT 29.2 (L) 04/01/2021   PLT 402 (H) 04/01/2021   GLUCOSE 118 (H) 03/31/2021   CHOL 201 (H) 08/11/2012   TRIG 129 08/11/2012   HDL 43 08/11/2012   LDLCALC 132 (H)  08/11/2012   ALT 46 (H) 03/31/2021   AST 50 (H) 03/31/2021   NA 135 03/31/2021   K 4.2 03/31/2021   CL 102 03/31/2021   CREATININE 0.64 03/31/2021   BUN 25 (H) 03/31/2021   CO2 21 (L) 03/31/2021   HGBA1C 6.1 (H) 08/11/2012    Assessment / Plan:      Large Pericardial Effusion with evidence of Tamponade- suspect due to Pericardits with recent Influenza infection- she is on Colchicine/steroids Dispo- patient stable, pericardiocentesis not felt safe to perform, will likely need pericardial window.  Dr. Kipp Brood will evaluate patient and evaluate for surgery  I  spent 55 minutes counseling the patient face to face.   Ellwood Handler, PA-C 04/01/2021 4:16 PM     Agree with above.  This is an 80 year old female that presents with a anterior pericardial effusion.  Images were reviewed with cardiology and they do not feel that there is a window for pericardial drain placement.  She is quite frail however she is agreeable to proceed given her symptoms.  The risks and benefits of a right thoracoscopy with pericardial window have been discussed and she is agreeable to proceed.  Lisette Mancebo Bary Leriche

## 2021-04-01 NOTE — Progress Notes (Signed)
Initial Nutrition Assessment  DOCUMENTATION CODES:   Severe malnutrition in context of chronic illness, Underweight  INTERVENTION:  - Encourage PO intake  - Continue Ensure Enlive po BID, each supplement provides 350 kcal and 20 grams of protein  - Recommend diet liberalization from heart healthy to regular to provide more options for adequate PO intake; spoke with MD who agrees  NUTRITION DIAGNOSIS:   Severe Malnutrition related to chronic illness as evidenced by severe muscle depletion, severe fat depletion.  GOAL:   Patient will meet greater than or equal to 90% of their needs  MONITOR:   PO intake, Supplement acceptance, Labs, Weight trends  REASON FOR ASSESSMENT:   Consult Assessment of nutrition requirement/status  ASSESSMENT:   Pt admitted from home with chest pains x1-2 weeks secondary to pericarditis and pericardial effusion. Pt continues with recurrent fevers throughout admission. PMH includes asthma, cancer, HTN, stroke, recent flu.  Pt sitting in chair during visit and states that she has some pain which is affecting her appetite. She provided limited diet/weight history. She endorses a decreased appetite for about 2 weeks PTA and she has been eating less since becoming sick but did not quantify how much she typically eats. She states that she is unsure how many meals she has been eating during admission but that she eats most of what she is provided. She endorses liking the Ensure but is not sure how many she is drinking daily. Noted unopened Ensure on table.  Pt states that she usually weighs about 101 lbs and suspects weight loss but is not sure how much. Per review of chart pt weighed 104 lbs in July 2022 and weighed 95 lbs at admission. This is a 9% weight loss in the last 5 months.   Medications: protonix, miralax, prednisone, senna  Labs: BUN 25, Phos 2.2, AST 50, ALT 46  NUTRITION - FOCUSED PHYSICAL EXAM:  Flowsheet Row Most Recent Value  Orbital  Region Severe depletion  Upper Arm Region Severe depletion  Thoracic and Lumbar Region Moderate depletion  Buccal Region Moderate depletion  Clavicle Bone Region Severe depletion  Clavicle and Acromion Bone Region Severe depletion  Scapular Bone Region Severe depletion  Dorsal Hand Severe depletion  Patellar Region Severe depletion  Anterior Thigh Region Severe depletion  Posterior Calf Region Severe depletion  Edema (RD Assessment) None  Hair Reviewed  Eyes Reviewed  Mouth Reviewed  Skin Reviewed  Nails Reviewed      Diet Order:   Diet Order             Diet regular Room service appropriate? Yes; Fluid consistency: Thin  Diet effective now                   EDUCATION NEEDS:   Education needs have been addressed  Skin:  Skin Assessment: Reviewed RN Assessment  Last BM:  03/31/21  Height:   Ht Readings from Last 1 Encounters:  03/23/21 5\' 1"  (1.549 m)    Weight:   Wt Readings from Last 1 Encounters:  03/23/21 43.3 kg    BMI:  Body mass index is 18.04 kg/m.  Estimated Nutritional Needs:   Kcal:  1450-1650  Protein:  75-85g  Fluid:  >/=1.5L  Clayborne Dana, RDN, LDN Clinical Nutrition

## 2021-04-01 NOTE — Progress Notes (Signed)
Date and time results received: 04/01/21 1719 (use smartphrase ".now" to insert current time)  Test: lactic acid  Critical Value: 4.9  Name of Provider Notified: [id number]  Orders Received? Or Actions Taken?: Dr. Cyndia Skeeters paged-will recheck lactic acid.

## 2021-04-01 NOTE — Anesthesia Preprocedure Evaluation (Addendum)
Anesthesia Evaluation  Patient identified by MRN, date of birth, ID band Patient awake    Reviewed: Allergy & Precautions, H&P , NPO status , Patient's Chart, lab work & pertinent test results  Airway Mallampati: II  TM Distance: >3 FB Neck ROM: Full    Dental no notable dental hx. (+) Edentulous Upper, Edentulous Lower, Dental Advisory Given   Pulmonary asthma , former smoker,    Pulmonary exam normal breath sounds clear to auscultation       Cardiovascular Exercise Tolerance: Good hypertension,  Rhythm:Regular Rate:Normal  Pericardial effusion   Neuro/Psych Anxiety CVA    GI/Hepatic Neg liver ROS, GERD  ,  Endo/Other  negative endocrine ROS  Renal/GU negative Renal ROS  negative genitourinary   Musculoskeletal   Abdominal   Peds  Hematology negative hematology ROS (+)   Anesthesia Other Findings   Reproductive/Obstetrics negative OB ROS                            Anesthesia Physical Anesthesia Plan  ASA: 3  Anesthesia Plan: General   Post-op Pain Management: Tylenol PO (pre-op)   Induction: Intravenous  PONV Risk Score and Plan: 4 or greater and Ondansetron, Dexamethasone and Treatment may vary due to age or medical condition  Airway Management Planned: Double Lumen EBT  Additional Equipment: CVP, Arterial line and Ultrasound Guidance Line Placement  Intra-op Plan:   Post-operative Plan: Extubation in OR  Informed Consent: I have reviewed the patients History and Physical, chart, labs and discussed the procedure including the risks, benefits and alternatives for the proposed anesthesia with the patient or authorized representative who has indicated his/her understanding and acceptance.     Dental advisory given  Plan Discussed with: CRNA  Anesthesia Plan Comments:        Anesthesia Quick Evaluation

## 2021-04-01 NOTE — Progress Notes (Signed)
Consent signed for pericardial window and placed on chart. Patient is requesting the surgery team discuss with her family. Will notify Dr. Kipp Brood.

## 2021-04-01 NOTE — Progress Notes (Signed)
*  PRELIMINARY RESULTS* Echocardiogram Limited 2D Echocardiogram has been performed.  Nicole Rush 04/01/2021, 1:52 PM

## 2021-04-01 NOTE — Progress Notes (Addendum)
PROGRESS NOTE  Nicole Rush HDQ:[id number] DOB: July 04, 1940   PCP: Lin Landsman, MD  Patient is from: Home  DOA: 03/21/2021 LOS: 26  Chief complaints:  Chief Complaint  Patient presents with   Chest Pain     Brief Narrative / Interim history: 80 year old F with PMH of asthma, cancer, HTN, CVA and recent flu infection presenting with 1 to 2 weeks of chest pain with associated dyspnea, nausea and vomiting, and admitted with pericarditis with trivial pericardial effusion and 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  She was febrile with significant leukocytosis.  CXR with poor inspiration but no active disease.  EKG with diffuse ST changes.  Cardiology consulted.  TTE with LVEF of 65 to 70%, indeterminate DD, moderate TVR and trivial pericardial effusion.  Patient was started on prednisone and colchicine.  Spiked another fever the morning of 12/6.  Blood cultures NGTD.  Urine culture with multiple species.  Subjective: Seen and examined earlier this morning.  Patient reports severe left-sided chest pain since last night although this was not reported by overnight staff.  She also reports associated shortness of breath.  She rates her pain 10/10 although she does not appear to be in that much distress.  Hemodynamically stable.  She states her breathing improved with nasal cannula.  She denies dizziness, nausea or abdominal pain.  Objective: Vitals:   03/31/21 1932 04/01/21 0005 04/01/21 0249 04/01/21 1049  BP: 140/84 116/68 128/75 118/82  Pulse: 84 84 83 (!) 106  Resp: 20 20 18  (!) 21  Temp: 98.7 F (37.1 C) 98.4 F (36.9 C) 98.4 F (36.9 C) 98.4 F (36.9 C)  TempSrc: Oral Oral Oral   SpO2: 96% 95% 95% 96%  Weight:      Height:        Intake/Output Summary (Last 24 hours) at 04/01/2021 1102 Last data filed at 03/31/2021 1932 Gross per 24 hour  Intake 360 ml  Output 300 ml  Net 60 ml   Filed Weights   03/23/21 1800  Weight: 43.3 kg    Examination:  GENERAL: Frail looking elderly female.  No  apparent distress HEENT: MMM.  Vision and hearing grossly intact.  NECK: Supple.  No apparent JVD.  RESP: 96% on 2 L.  No IWOB.  Fair aeration bilaterally. CVS:  RRR. Heart sounds normal.  ABD/GI/GU: BS+. Abd soft, NTND.  MSK/EXT:  Moves extremities.  Significant muscle mass and subcu fat loss. SKIN: no apparent skin lesion or wound NEURO: Awake and alert. Oriented fairly.  No apparent focal neuro deficit. PSYCH: Calm. Normal affect.   Procedures:  None  Microbiology summarized: 03/22/2021-Influenza A positive 03/22/2021-COVID-19 PCR negative. 03/26/2021-blood cultures NGTD. 03/26/2021-urine culture pending.  Assessment & Plan: Pericarditis with small Pericardial Effusion-felt to be postviral from influenza A infection. TTE as above.  CRP 36>> 10.  ESR 108>>70.  CT C/A/P raises concern for pericarditis.  Significant leukocytosis but on a steroid.  Last fever on 12/9. -Continue colchicine and prednisone   Influenza A: Initially diagnosed on 11/27. -Completed course of Tamiflu. -Supportive care with incentive telemetry, OOB, mucolytic's and antitussive  Intermittent left-sided chest pain: Likely from pericarditis.  EKG and CXR reassuring.  She rates her pain 10/10 although she does not appear to be in that much distress.  Initial troponin 20.  BNP 101.  Radial pulses symmetric bilaterally.  Low suspicion for PE while on DVT prophylaxis. -Limited echocardiogram to reassess pericarditis. -Pain control with as needed oxycodone and IV fentanyl  Addendum: Troponin trended from 20-627.  Continues to  endorse chest pain.  Cardiology reconsulted.  Per cardiology, echocardiogram concerning for large pericardial effusion with concern for tamponade.  Cardiology discussing case with cardiothoracic surgery for possible pericardial window.  Patient's daughter notified over the phone  Fever: 102.8 on 12/6, 101 on 12/8 and 100.4 on 12/9.   Blood culture, CXR x2 and UA unrevealing.  Urine culture with  multiple species.  Could be from her pericarditis but rising leukocytosis concerning although could be from steroid. No further fever since 12/9.  Leukocytosis improving. -Continue monitoring -Continue IV ceftriaxone empirically for a total of 5 days  Acute respiratory failure with hypoxia: Likely from influenza A and possible atelectasis.  Resolved. -Already on prednisone for pericarditis. -Continue Xopenex and Atrovent every 6 hours -Mucolytic's and antitussive, incentive spirometry, OOB/PT/OT. -PCCM recommended outpatient follow-up for calcified granuloma in left lung.  Sinus tachycardia: Resolved. -Continue low-dose metoprolol.   Hyponatremia/hypophosphatemia/hypokalemia/hypomagnesemia: Resolved.  Elevated liver enzymes: Pattern suggests rhabdo or EtOH but CK within normal. Recent Labs  Lab 03/26/21 0303 03/27/21 0544 03/28/21 0313 03/29/21 0426 03/30/21 0040 03/31/21 0053  AST 106* 111* 96* 57*  --  50*  ALT 41 55* 62* 50*  --  46*  ALKPHOS 96 80 78 94  --  84  BILITOT 0.7 0.8 0.9 0.9  --  0.5  PROT 6.9 5.8* 6.0* 6.3*  --  6.3*  ALBUMIN 2.1* 1.8* 1.8*  1.8* 1.9* 1.9* 1.8*  1.9*  -Continue holding Crestor -Recheck intermittently.  Essential hypertension: Normotensive. -Continue holding home losartan and HCTZ -Low-dose metoprolol as above  Microcytic anemia: H&H relatively stable.  Anemia panel consistent with anemia of chronic disease. Recent Labs    03/23/21 0100 03/24/21 0149 03/25/21 0029 03/26/21 0303 03/27/21 0544 03/28/21 0313 03/29/21 0426 03/30/21 0109 03/31/21 0053 04/01/21 0046  HGB 10.2* 9.8* 9.1* 12.1 9.8* 10.1* 10.5* 9.6* 9.6* 9.9*  -Monitor intermittently  Urinary Retention?  Resolved. -Intermittent bladder scan as needed   Metabolic Acidosis: Improved. -Monitor intermittently   Thrombocytosis: Resolved  Hyperbilirubinemia: Resolved.   Constipation -Bowel regimen  Generalized weakness/physical deconditioning   -PT/OT  Leukocytosis/bandemia: Improved. -Continue monitoring  Severe malnutrition: low BMI for age, muscle mass and subcu fat loss and poor p.o. intake. Body mass index is 18.04 kg/m.  -Dietitian consulted.       DVT prophylaxis:  SCDs Start: 03/22/21 0304  Code Status: Full code Family Communication: Updated patient's daughter over the phone. Level of care: Telemetry Medical Status is: Inpatient  Remains inpatient appropriate because: Further evaluation recurrence left-sided chest pain (echocardiogram) and safe disposition/SNF bed.     Final disposition: SNF in the next 24 to 48 hours once chest pain resolves or improves.    Consultants:  Cardiology-signed off   Sch Meds:  Scheduled Meds:  colchicine  0.6 mg Oral Daily   enoxaparin (LOVENOX) injection  40 mg Subcutaneous Q24H   feeding supplement  237 mL Oral BID BM   guaiFENesin  1,200 mg Oral BID   loratadine  10 mg Oral Daily   melatonin  5 mg Oral QHS   metoprolol tartrate  12.5 mg Oral BID   pantoprazole  40 mg Oral Daily   polyethylene glycol  17 g Oral BID   predniSONE  15 mg Oral Q breakfast   Followed by   Derrill Memo ON 04/07/2021] predniSONE  10 mg Oral Q breakfast   Followed by   Derrill Memo ON 04/14/2021] predniSONE  5 mg Oral Q breakfast   Followed by   Derrill Memo ON 04/21/2021] predniSONE  2.5 mg Oral Q breakfast   senna-docusate  1 tablet Oral BID   Continuous Infusions:  cefTRIAXone (ROCEPHIN)  IV 1 g (03/31/21 1308)   PRN Meds:.acetaminophen **OR** acetaminophen, fentaNYL (SUBLIMAZE) injection, hydrOXYzine, ipratropium, levalbuterol, ondansetron **OR** ondansetron (ZOFRAN) IV, oxyCODONE  Antimicrobials: Anti-infectives (From admission, onward)    Start     Dose/Rate Route Frequency Ordered Stop   03/29/21 1400  cefTRIAXone (ROCEPHIN) 1 g in sodium chloride 0.9 % 100 mL IVPB        1 g 200 mL/hr over 30 Minutes Intravenous Every 24 hours 03/29/21 1221 04/03/21 1359   03/22/21 1000  oseltamivir  (TAMIFLU) capsule 30 mg       Note to Pharmacy: Tamiflu 30 mg BID for CrCl <  60 mL/min   30 mg Oral 2 times daily 03/22/21 0304 03/23/21 2208        I have personally reviewed the following labs and images: CBC: Recent Labs  Lab 03/26/21 0303 03/27/21 0544 03/28/21 0313 03/29/21 0426 03/30/21 0109 03/31/21 0053 04/01/21 0046  WBC 9.3 15.3* 15.0* 19.1* 17.2* 14.7* 14.9*  NEUTROABS 7.9* 13.2* 12.6*  --   --   --   --   HGB 12.1 9.8* 10.1* 10.5* 9.6* 9.6* 9.9*  HCT 35.1* 28.3* 29.6* 30.4* 28.3* 27.9* 29.2*  MCV 74.5* 74.1* 74.4* 73.8* 74.9* 74.4* 74.9*  PLT 408* 331 288 328 343 344 402*   BMP &GFR Recent Labs  Lab 03/26/21 0303 03/27/21 0544 03/28/21 0313 03/30/21 0040 03/30/21 0109 03/31/21 0053  NA 134* 137 136 134*  --  135  K 4.4 3.3* 4.3 4.7  --  4.2  CL 100 103 105 104  --  102  CO2 22 23 22  19*  --  21*  GLUCOSE 88 85 87 116*  --  118*  BUN 34* 24* 21 30*  --  25*  CREATININE 0.92 0.66 0.70 0.67  --  0.64  CALCIUM 8.6* 7.7* 8.2* 8.3*  --  8.1*  MG 2.0 1.7 2.1  --  2.1 1.9  PHOS 1.9* 2.8 2.1* 2.9  --  2.2*   Estimated Creatinine Clearance: 37.1 mL/min (by C-G formula based on SCr of 0.64 mg/dL). Liver & Pancreas: Recent Labs  Lab 03/26/21 0303 03/27/21 0544 03/28/21 0313 03/29/21 0426 03/30/21 0040 03/31/21 0053  AST 106* 111* 96* 57*  --  50*  ALT 41 55* 62* 50*  --  46*  ALKPHOS 96 80 78 94  --  84  BILITOT 0.7 0.8 0.9 0.9  --  0.5  PROT 6.9 5.8* 6.0* 6.3*  --  6.3*  ALBUMIN 2.1* 1.8* 1.8*  1.8* 1.9* 1.9* 1.8*  1.9*   No results for input(s): LIPASE, AMYLASE in the last 168 hours.  No results for input(s): AMMONIA in the last 168 hours. Diabetic: No results for input(s): HGBA1C in the last 72 hours. No results for input(s): GLUCAP in the last 168 hours.  Cardiac Enzymes: Recent Labs  Lab 03/27/21 1430  CKTOTAL 92   No results for input(s): PROBNP in the last 8760 hours. Coagulation Profile: No results for input(s): INR, PROTIME in  the last 168 hours. Thyroid Function Tests: No results for input(s): TSH, T4TOTAL, FREET4, T3FREE, THYROIDAB in the last 72 hours. Lipid Profile: No results for input(s): CHOL, HDL, LDLCALC, TRIG, CHOLHDL, LDLDIRECT in the last 72 hours. Anemia Panel: No results for input(s): VITAMINB12, FOLATE, FERRITIN, TIBC, IRON, RETICCTPCT in the last 72 hours. Urine analysis:    Component Value Date/Time  COLORURINE ORANGE (A) 03/26/2021 0816   APPEARANCEUR CLEAR 03/26/2021 0816   LABSPEC 1.025 03/26/2021 0816   PHURINE 6.0 03/26/2021 0816   GLUCOSEU NEGATIVE 03/26/2021 0816   HGBUR SMALL (A) 03/26/2021 0816   BILIRUBINUR NEGATIVE 03/26/2021 0816   KETONESUR NEGATIVE 03/26/2021 0816   PROTEINUR 30 (A) 03/26/2021 0816   NITRITE NEGATIVE 03/26/2021 0816   LEUKOCYTESUR NEGATIVE 03/26/2021 0816   Sepsis Labs: Invalid input(s): PROCALCITONIN, [place]  Microbiology: Recent Results (from the past 240 hour(s))  Urine Culture     Status: Abnormal   Collection Time: 03/26/21  8:17 AM   Specimen: Urine, Clean Catch  Result Value Ref Range Status   Specimen Description URINE, CLEAN CATCH  Final   Special Requests   Final    NONE Performed at [facility], [address], [place], [place] [address]    Culture MULTIPLE SPECIES PRESENT, SUGGEST RECOLLECTION (A)  Final   Report Status 03/28/2021 FINAL  Final  Culture, blood (routine x 2)     Status: None   Collection Time: 03/26/21  9:27 AM   Specimen: BLOOD RIGHT HAND  Result Value Ref Range Status   Specimen Description BLOOD RIGHT HAND  Final   Special Requests   Final    AEROBIC BOTTLE ONLY Blood Culture results may not be optimal due to an inadequate volume of blood received in culture bottles   Culture   Final    NO GROWTH 5 DAYS Performed at [facility], [place] [address] [place], [place] [address]    Report Status 03/31/2021 FINAL  Final  Culture, blood (routine x 2)     Status: None   Collection Time: 03/26/21 10:33  AM   Specimen: BLOOD RIGHT HAND  Result Value Ref Range Status   Specimen Description BLOOD RIGHT HAND  Final   Special Requests   Final    AEROBIC BOTTLE ONLY Blood Culture results may not be optimal due to an inadequate volume of blood received in culture bottles   Culture   Final    NO GROWTH 5 DAYS Performed at [facility], [place] [address], [place], [place] [address]    Report Status 03/31/2021 FINAL  Final    Radiology Studies: DG Chest Port 1 View  Result Date: 04/01/2021 CLINICAL DATA:  Chest pain EXAM: PORTABLE CHEST 1 VIEW COMPARISON:  Chest radiograph dated March 29, 2021 FINDINGS: The heart is enlarged. No focal consolidation or pleural effusion. Surgical clips in the left chest wall. No acute osseous abnormality. IMPRESSION: 1.  Stable cardiomegaly. 2.  No focal consolidation or pleural effusion. Electronically Signed   By: Keane Police D.O.   On: 04/01/2021 10:06       Nicole Rush T. [place]  If 7PM-7AM, please contact night-coverage [email or web address] 04/01/2021, 11:02 AM

## 2021-04-01 NOTE — Progress Notes (Signed)
Cardiology Progress Note  Patient ID: Nicole Rush General MRN: [id number] DOB: Aug 27, 1940 Date of Encounter: 04/01/2021  Primary Cardiologist: None  Subjective   Chief Complaint: Chest pain  HPI: Cardiology asked to evaluate due to chest pain and elevated troponin.  She reports constant chest pressure.  Described as sharp tightness in her chest.  Worse with laying down.  Better with leaning forward.  BP soft.  Elevated neck veins.  Effusion is now severe located mainly over the anterior RV.  ROS:  All other ROS reviewed and negative. Pertinent positives noted in the HPI.     Inpatient Medications  Scheduled Meds:  colchicine  0.6 mg Oral Daily   enoxaparin (LOVENOX) injection  40 mg Subcutaneous Q24H   feeding supplement  237 mL Oral BID BM   guaiFENesin  1,200 mg Oral BID   loratadine  10 mg Oral Daily   melatonin  5 mg Oral QHS   metoprolol tartrate  12.5 mg Oral BID   pantoprazole  40 mg Oral Daily   polyethylene glycol  17 g Oral BID   predniSONE  15 mg Oral Q breakfast   Followed by   Derrill Memo ON 04/07/2021] predniSONE  10 mg Oral Q breakfast   Followed by   Derrill Memo ON 04/14/2021] predniSONE  5 mg Oral Q breakfast   Followed by   Derrill Memo ON 04/21/2021] predniSONE  2.5 mg Oral Q breakfast   senna-docusate  1 tablet Oral BID   Continuous Infusions:  cefTRIAXone (ROCEPHIN)  IV 1 g (03/31/21 1308)   PRN Meds: acetaminophen **OR** acetaminophen, fentaNYL (SUBLIMAZE) injection, hydrOXYzine, ipratropium, levalbuterol, ondansetron **OR** ondansetron (ZOFRAN) IV, oxyCODONE   Vital Signs   Vitals:   03/31/21 1932 04/01/21 0005 04/01/21 0249 04/01/21 1049  BP: 140/84 116/68 128/75 118/82  Pulse: 84 84 83 (!) 106  Resp: 20 20 18  (!) 21  Temp: 98.7 F (37.1 C) 98.4 F (36.9 C) 98.4 F (36.9 C) 98.4 F (36.9 C)  TempSrc: Oral Oral Oral   SpO2: 96% 95% 95% 96%  Weight:      Height:        Intake/Output Summary (Last 24 hours) at 04/01/2021 1404 Last data filed at  03/31/2021 1932 Gross per 24 hour  Intake 360 ml  Output 200 ml  Net 160 ml   Last 3 Weights 03/23/2021 11/06/2020 11/07/2019  Weight (lbs) 95 lb 7.4 oz 104 lb 7 oz 100 lb 3.2 oz  Weight (kg) 43.3 kg 47.373 kg 45.45 kg      Telemetry  Overnight telemetry shows sinus tachycardia 100s, PVCs, which I personally reviewed.   ECG  The most recent ECG shows sinus rhythm heart rate 98, PVCs noted, nonspecific ST-T changes, which I personally reviewed.   Physical Exam   Vitals:   03/31/21 1932 04/01/21 0005 04/01/21 0249 04/01/21 1049  BP: 140/84 116/68 128/75 118/82  Pulse: 84 84 83 (!) 106  Resp: 20 20 18  (!) 21  Temp: 98.7 F (37.1 C) 98.4 F (36.9 C) 98.4 F (36.9 C) 98.4 F (36.9 C)  TempSrc: Oral Oral Oral   SpO2: 96% 95% 95% 96%  Weight:      Height:        Intake/Output Summary (Last 24 hours) at 04/01/2021 1404 Last data filed at 03/31/2021 1932 Gross per 24 hour  Intake 360 ml  Output 200 ml  Net 160 ml    Last 3 Weights 03/23/2021 11/06/2020 11/07/2019  Weight (lbs) 95 lb 7.4 oz 104 lb 7 oz  100 lb 3.2 oz  Weight (kg) 43.3 kg 47.373 kg 45.45 kg    Body mass index is 18.04 kg/m.   General: Ill-appearing Head: Atraumatic, normal size  Eyes: PEERLA, EOMI  Neck: Supple, JVD 12 to 15 cm of water Endocrine: No thryomegaly Cardiac: Normal S1, S2; tachycardia, no murmurs Lungs: Clear to auscultation bilaterally, no wheezing, rhonchi or rales  Abd: Soft, nontender, no hepatomegaly  Ext: No edema, pulses 2+ Musculoskeletal: No deformities, BUE and BLE strength normal and equal Skin: Warm and dry, no rashes   Neuro: Alert and oriented to person, place, time, and situation, CNII-XII grossly intact, no focal deficits  Psych: Normal mood and affect   Labs  High Sensitivity Troponin:   Recent Labs  Lab 03/21/21 0935 03/21/21 1135 03/21/21 2014 04/01/21 0909 04/01/21 1124  TROPONINIHS 9 7 9  20* 627*     Cardiac EnzymesNo results for input(s): TROPONINI in the last  168 hours. No results for input(s): TROPIPOC in the last 168 hours.  Chemistry Recent Labs  Lab 03/28/21 0313 03/29/21 0426 03/30/21 0040 03/31/21 0053  NA 136  --  134* 135  K 4.3  --  4.7 4.2  CL 105  --  104 102  CO2 22  --  19* 21*  GLUCOSE 87  --  116* 118*  BUN 21  --  30* 25*  CREATININE 0.70  --  0.67 0.64  CALCIUM 8.2*  --  8.3* 8.1*  PROT 6.0* 6.3*  --  6.3*  ALBUMIN 1.8*  1.8* 1.9* 1.9* 1.8*  1.9*  AST 96* 57*  --  50*  ALT 62* 50*  --  46*  ALKPHOS 78 94  --  84  BILITOT 0.9 0.9  --  0.5  GFRNONAA >60  --  >60 >60  ANIONGAP 9  --  11 12    Hematology Recent Labs  Lab 03/30/21 0109 03/31/21 0053 04/01/21 0046  WBC 17.2* 14.7* 14.9*  RBC 3.78* 3.75* 3.90  HGB 9.6* 9.6* 9.9*  HCT 28.3* 27.9* 29.2*  MCV 74.9* 74.4* 74.9*  MCH 25.4* 25.6* 25.4*  MCHC 33.9 34.4 33.9  RDW 14.6 14.9 15.0  PLT 343 344 402*   BNP Recent Labs  Lab 04/01/21 0909  BNP 101.0*    DDimer No results for input(s): DDIMER in the last 168 hours.   Radiology  DG Chest Port 1 View  Result Date: 04/01/2021 CLINICAL DATA:  Chest pain EXAM: PORTABLE CHEST 1 VIEW COMPARISON:  Chest radiograph dated March 29, 2021 FINDINGS: The heart is enlarged. No focal consolidation or pleural effusion. Surgical clips in the left chest wall. No acute osseous abnormality. IMPRESSION: 1.  Stable cardiomegaly. 2.  No focal consolidation or pleural effusion. Electronically Signed   By: Keane Police D.O.   On: 04/01/2021 10:06    Cardiac Studies  TTE 03/24/2021   1. Left ventricular ejection fraction, by estimation, is 65 to 70%. The  left ventricle has normal function. The left ventricle has no regional  wall motion abnormalities. Left ventricular diastolic parameters are  indeterminate. Elevated left ventricular  end-diastolic pressure.   2. Right ventricular systolic function is normal. The right ventricular  size is normal. There is normal pulmonary artery systolic pressure.   3. The mitral  valve is normal in structure. Trivial mitral valve  regurgitation.   4. Tricuspid valve regurgitation is moderate.   5. The aortic valve is tricuspid. Aortic valve regurgitation is not  visualized. No aortic stenosis is present.  TTE 04/01/2021 (My read) Large pericardial effusion located over the anterior RV, diastolic RV collapse noted, normal LV function EF 65-70% with no regional wall motion abnormalities, diastolic flow reversal seen in the hepatic veins and expiration  Patient Profile  Nicole Rush is a 80 y.o. female with asthma, cancer, hypertension, prior stroke, recent flu infection who was admitted on 03/22/2021 with fever and pericarditis.  Cardiology was reconsulted on 04/01/2021 for persistent chest pain and elevated troponin.  Assessment & Plan   #Acute pericarditis #Large pericardial effusion with concerns for tamponade #Influenza  #Elevated troponin secondary to tamponade  -She has been admitted with sharp chest pain.  Described as worse with laying down and better with leaning forward.  Inflammatory markers were elevated.  Initial EKG did demonstrate diffuse ST elevations likely consistent with pericarditis. -She has been admitted to the hospital with recurrent fevers.  She has been treated with steroids and colchicine for pericarditis but symptoms have worsened. -She has continued to have pleuritic chest pain.  Troponins not elevated. -Repeat echocardiogram demonstrates large pericardial effusion anterior to the RV.  It appears to be loculated and not inferiorly located as seen in subcostal windows on echo. -There are several echo features consistent with tamponade.  This includes RV diastolic collapse.  She also has diastolic flow reversal in hepatic veins and expiration.  Her IVC is collapsing but echo features are consistent with tamponade. -Clinically blood pressure is 100/70.  She has elevated JVD.  She has tachycardia.  Given persistent symptoms and worsening  effusion I believe she is developing cardiac tamponade.  I discussed her case with interventional cardiology who not feel a pericardiocentesis can be safely performed.  I believe the best option would be to discuss with CT surgery for pericardial window. -For now serum creatinine is normal.  BNP is mildly elevated.  No aspirin or heparin as this would be high risk to bleed into the pericardial space.  I believe her troponin elevation is secondary to extracardiac RV pressure overload in the setting of tamponade. -For now would continue steroids.  We likely will pursue ibuprofen taper after surgery.  Continue colchicine. -I have held her beta-blocker. -SCDs for DVT prophylaxis. -Please notify cardiology on-call if she becomes more unstable.  For questions or updates, please contact [place] Please consult [email or web address] for contact info under   Time Spent with Patient: I have spent a total of 35 minutes with patient reviewing hospital notes, telemetry, EKGs, labs and examining the patient as well as establishing an assessment and plan that was discussed with the patient.  > 50% of time was spent in direct patient care.    Signed, Addison Naegeli. Audie Box, MD, [place]  04/01/2021 2:04 PM

## 2021-04-01 NOTE — TOC Progression Note (Addendum)
Transition of Care [facility]) - Progression Note    Patient Details  Name: Nicole Rush MRN: [id number] Date of Birth: 01/09/1941  Transition of Care [facility]) CM/SW Contact  Zenon Mayo, RN Phone Number: 04/01/2021, 2:10 PM  Clinical Narrative:    Patient continues to have lots of pain, conts on iv abx thru 12/15, will be dc home with home health and abx will be changed to po per MD.   She is already set up with [facility].  NCM spoke with [facility] with [employer] to see if DME has been delivered.  Adela Lank states it was to have been delivered on Friday but it did not get delivered. She is checking to make sure it is delivered today. TOC will continue to  follow for dc needs.   Expected Discharge Plan: [place] Barriers to Discharge: Continued Medical Work up  Expected Discharge Plan and Services Expected Discharge Plan: [place]                         DME Arranged: Bedside commode, Walker rolling DME Agency: [employer] Date DME Agency Contacted: 03/25/21 Time DME Agency Contacted: [phone] Representative spoke with at DME Agency: [place]: PT, OT [place] Agency: [place] Date [place]: 03/25/21 Time [place]: [place] Representative spoke with at [place]: [place] ([place]) Interventions    Readmission Risk Interventions No flowsheet data found.

## 2021-04-02 ENCOUNTER — Inpatient Hospital Stay (HOSPITAL_COMMUNITY): Payer: Medicare HMO | Admitting: Anesthesiology

## 2021-04-02 ENCOUNTER — Inpatient Hospital Stay (HOSPITAL_COMMUNITY): Payer: Medicare HMO

## 2021-04-02 ENCOUNTER — Encounter (HOSPITAL_COMMUNITY)
Admission: EM | Disposition: A | Discharge status: Home Health | Payer: Self-pay | Source: Home / Self Care | Attending: Student

## 2021-04-02 DIAGNOSIS — R0789 Other chest pain: Secondary | ICD-10-CM

## 2021-04-02 DIAGNOSIS — I3139 Other pericardial effusion (noninflammatory): Secondary | ICD-10-CM

## 2021-04-02 DIAGNOSIS — E872 Acidosis, unspecified: Secondary | ICD-10-CM

## 2021-04-02 DIAGNOSIS — Z789 Other specified health status: Secondary | ICD-10-CM

## 2021-04-02 DIAGNOSIS — Z515 Encounter for palliative care: Secondary | ICD-10-CM

## 2021-04-02 LAB — PHOSPHORUS: Phosphorus: 3.3 mg/dL (ref 2.5–4.6)

## 2021-04-02 LAB — CBC WITH DIFFERENTIAL/PLATELET
Abs Immature Granulocytes: 0.18 10*3/uL — ABNORMAL HIGH (ref 0.00–0.07)
Basophils Absolute: 0 10*3/uL (ref 0.0–0.1)
Basophils Relative: 0 %
Eosinophils Absolute: 0 10*3/uL (ref 0.0–0.5)
Eosinophils Relative: 0 %
HCT: 34.1 % — ABNORMAL LOW (ref 36.0–46.0)
Hemoglobin: 11.4 g/dL — ABNORMAL LOW (ref 12.0–15.0)
Immature Granulocytes: 1 %
Lymphocytes Relative: 9 %
Lymphs Abs: 1.8 10*3/uL (ref 0.7–4.0)
MCH: 25.2 pg — ABNORMAL LOW (ref 26.0–34.0)
MCHC: 33.4 g/dL (ref 30.0–36.0)
MCV: 75.3 fL — ABNORMAL LOW (ref 80.0–100.0)
Monocytes Absolute: 1 10*3/uL (ref 0.1–1.0)
Monocytes Relative: 5 %
Neutro Abs: 18.2 10*3/uL — ABNORMAL HIGH (ref 1.7–7.7)
Neutrophils Relative %: 85 %
Platelets: 522 10*3/uL — ABNORMAL HIGH (ref 150–400)
RBC: 4.53 MIL/uL (ref 3.87–5.11)
RDW: 14.8 % (ref 11.5–15.5)
WBC: 21.3 10*3/uL — ABNORMAL HIGH (ref 4.0–10.5)
nRBC: 0.1 % (ref 0.0–0.2)

## 2021-04-02 LAB — COMPREHENSIVE METABOLIC PANEL
ALT: 50 U/L — ABNORMAL HIGH (ref 0–44)
AST: 42 U/L — ABNORMAL HIGH (ref 15–41)
Albumin: 2.2 g/dL — ABNORMAL LOW (ref 3.5–5.0)
Alkaline Phosphatase: 90 U/L (ref 38–126)
Anion gap: 12 (ref 5–15)
BUN: 23 mg/dL (ref 8–23)
CO2: 20 mmol/L — ABNORMAL LOW (ref 22–32)
Calcium: 8.5 mg/dL — ABNORMAL LOW (ref 8.9–10.3)
Chloride: 101 mmol/L (ref 98–111)
Creatinine, Ser: 0.81 mg/dL (ref 0.44–1.00)
GFR, Estimated: >60 mL/min (ref >60)
Glucose, Bld: 118 mg/dL — ABNORMAL HIGH (ref 70–99)
Potassium: 4.4 mmol/L (ref 3.5–5.1)
Sodium: 133 mmol/L — ABNORMAL LOW (ref 135–145)
Total Bilirubin: 0.9 mg/dL (ref 0.3–1.2)
Total Protein: 6.8 g/dL (ref 6.5–8.1)

## 2021-04-02 LAB — MAGNESIUM: Magnesium: 1.8 mg/dL (ref 1.7–2.4)

## 2021-04-02 LAB — SURGICAL PCR SCREEN
MRSA, PCR: NEGATIVE
Staphylococcus aureus: NEGATIVE

## 2021-04-02 LAB — LACTIC ACID, PLASMA: Lactic Acid, Venous: 3.6 mmol/L (ref 0.5–1.9)

## 2021-04-02 SURGERY — CREATION, PERICARDIAL WINDOW, ANTERIOR THORACOTOMY APPROACH
Anesthesia: General | Site: Chest

## 2021-04-02 MED ORDER — LABETALOL HCL 5 MG/ML IV SOLN
INTRAVENOUS | Status: DC | PRN
  Administered 2021-04-02: 2.5 mg via INTRAVENOUS

## 2021-04-02 MED ORDER — CEFAZOLIN SODIUM-DEXTROSE 2-3 GM-%(50ML) IV SOLR
INTRAVENOUS | Status: DC | PRN
  Administered 2021-04-02: 2 g via INTRAVENOUS

## 2021-04-02 MED ORDER — FENTANYL CITRATE (PF) 100 MCG/2ML IJ SOLN
25.0000 ug | INTRAMUSCULAR | Status: DC | PRN
  Administered 2021-04-02 (×4): 25 ug via INTRAVENOUS

## 2021-04-02 MED ORDER — ROCURONIUM BROMIDE 10 MG/ML (PF) SYRINGE
PREFILLED_SYRINGE | INTRAVENOUS | Status: DC | PRN
  Administered 2021-04-02: 30 mg via INTRAVENOUS

## 2021-04-02 MED ORDER — BISACODYL 5 MG PO TBEC
10.0000 mg | DELAYED_RELEASE_TABLET | Freq: Every day | ORAL | Status: DC
  Administered 2021-04-02 – 2021-04-06 (×5): 10 mg via ORAL
  Filled 2021-04-02 (×5): qty 2

## 2021-04-02 MED ORDER — LIDOCAINE 2% (20 MG/ML) 5 ML SYRINGE
INTRAMUSCULAR | Status: AC
  Filled 2021-04-02: qty 5

## 2021-04-02 MED ORDER — ETOMIDATE 2 MG/ML IV SOLN
INTRAVENOUS | Status: DC | PRN
  Administered 2021-04-02: 10 mg via INTRAVENOUS

## 2021-04-02 MED ORDER — 0.9 % SODIUM CHLORIDE (POUR BTL) OPTIME
TOPICAL | Status: DC | PRN
  Administered 2021-04-02: 2000 mL

## 2021-04-02 MED ORDER — PROPOFOL 10 MG/ML IV BOLUS
INTRAVENOUS | Status: AC
  Filled 2021-04-02: qty 20

## 2021-04-02 MED ORDER — LACTATED RINGERS IV SOLN: INTRAVENOUS | Status: DC | PRN

## 2021-04-02 MED ORDER — BUPIVACAINE HCL (PF) 0.5 % IJ SOLN
INTRAMUSCULAR | Status: AC
  Filled 2021-04-02: qty 30

## 2021-04-02 MED ORDER — BUPIVACAINE LIPOSOME 1.3 % IJ SUSP
INTRAMUSCULAR | Status: AC
  Filled 2021-04-02: qty 20

## 2021-04-02 MED ORDER — ENOXAPARIN SODIUM 30 MG/0.3ML IJ SOSY
30.0000 mg | PREFILLED_SYRINGE | Freq: Every day | INTRAMUSCULAR | Status: DC
  Administered 2021-04-02 – 2021-04-06 (×5): 30 mg via SUBCUTANEOUS
  Filled 2021-04-02 (×5): qty 0.3

## 2021-04-02 MED ORDER — DEXAMETHASONE SODIUM PHOSPHATE 10 MG/ML IJ SOLN
INTRAMUSCULAR | Status: DC | PRN
  Administered 2021-04-02: 10 mg via INTRAVENOUS

## 2021-04-02 MED ORDER — SUCCINYLCHOLINE 20MG/ML (10ML) SYRINGE FOR MEDFUSION PUMP - OPTIME
INTRAMUSCULAR | Status: DC | PRN
  Administered 2021-04-02: 100 mg via INTRAVENOUS

## 2021-04-02 MED ORDER — PHENYLEPHRINE HCL-NACL 20-0.9 MG/250ML-% IV SOLN
INTRAVENOUS | Status: DC | PRN
  Administered 2021-04-02: 50 ug/min via INTRAVENOUS

## 2021-04-02 MED ORDER — BUPIVACAINE LIPOSOME 1.3 % IJ SUSP
INTRAMUSCULAR | Status: DC | PRN
  Administered 2021-04-02: 50 mL

## 2021-04-02 MED ORDER — DEXAMETHASONE SODIUM PHOSPHATE 10 MG/ML IJ SOLN
INTRAMUSCULAR | Status: AC
  Filled 2021-04-02: qty 1

## 2021-04-02 MED ORDER — ROCURONIUM BROMIDE 10 MG/ML (PF) SYRINGE
PREFILLED_SYRINGE | INTRAVENOUS | Status: AC
  Filled 2021-04-02: qty 10

## 2021-04-02 MED ORDER — ONDANSETRON HCL 4 MG/2ML IJ SOLN: 4.0000 mg | Freq: Four times a day (QID) | INTRAMUSCULAR | Status: DC | PRN

## 2021-04-02 MED ORDER — SUGAMMADEX SODIUM 200 MG/2ML IV SOLN
INTRAVENOUS | Status: DC | PRN
  Administered 2021-04-02: 200 mg via INTRAVENOUS

## 2021-04-02 MED ORDER — FENTANYL CITRATE (PF) 250 MCG/5ML IJ SOLN
INTRAMUSCULAR | Status: AC
  Filled 2021-04-02: qty 5

## 2021-04-02 MED ORDER — FENTANYL CITRATE (PF) 250 MCG/5ML IJ SOLN
INTRAMUSCULAR | Status: DC | PRN
  Administered 2021-04-02 (×3): 50 ug via INTRAVENOUS

## 2021-04-02 MED ORDER — CHLORHEXIDINE GLUCONATE CLOTH 2 % EX PADS
6.0000 | MEDICATED_PAD | Freq: Every day | CUTANEOUS | Status: DC
  Administered 2021-04-03 – 2021-04-06 (×4): 6 via TOPICAL

## 2021-04-02 MED ORDER — ONDANSETRON HCL 4 MG/2ML IJ SOLN
INTRAMUSCULAR | Status: DC | PRN
  Administered 2021-04-02: 4 mg via INTRAVENOUS

## 2021-04-02 MED ORDER — GUAIFENESIN 100 MG/5ML PO LIQD: 5.0000 mL | ORAL | Status: DC | PRN

## 2021-04-02 MED ORDER — FENTANYL CITRATE (PF) 100 MCG/2ML IJ SOLN
INTRAMUSCULAR | Status: AC
  Filled 2021-04-02: qty 2

## 2021-04-02 MED ORDER — ACETAMINOPHEN 160 MG/5ML PO SOLN
1000.0000 mg | Freq: Four times a day (QID) | ORAL | Status: DC
  Administered 2021-04-05: 1000 mg via ORAL
  Filled 2021-04-02 (×3): qty 40.6

## 2021-04-02 MED ORDER — ACETAMINOPHEN 500 MG PO TABS
1000.0000 mg | ORAL_TABLET | Freq: Four times a day (QID) | ORAL | Status: DC
  Administered 2021-04-02 – 2021-04-05 (×11): 1000 mg via ORAL
  Filled 2021-04-02 (×13): qty 2

## 2021-04-02 MED ORDER — ONDANSETRON HCL 4 MG/2ML IJ SOLN
INTRAMUSCULAR | Status: AC
  Filled 2021-04-02: qty 2

## 2021-04-02 SURGICAL SUPPLY — 59 items
ADH SKN CLS APL DERMABOND .7 (GAUZE/BANDAGES/DRESSINGS) ×1
ADH SKN CLS LQ APL DERMABOND (GAUZE/BANDAGES/DRESSINGS) ×1
APL PRP STRL LF DISP 70% ISPRP (MISCELLANEOUS) ×1
BLADE SURG 11 STRL SS (BLADE) ×1 IMPLANT
CANISTER SUCT 3000ML PPV (MISCELLANEOUS) ×2 IMPLANT
CHLORAPREP W/TINT 26 (MISCELLANEOUS) ×2 IMPLANT
CNTNR URN SCR LID CUP LEK RST (MISCELLANEOUS) ×1 IMPLANT
CONT SPEC 4OZ STRL OR WHT (MISCELLANEOUS) ×4
DEFOGGER SCOPE WARMER CLEARIFY (MISCELLANEOUS) ×1 IMPLANT
DERMABOND ADHESIVE PROPEN (GAUZE/BANDAGES/DRESSINGS) ×1
DERMABOND ADVANCED (GAUZE/BANDAGES/DRESSINGS) ×1
DERMABOND ADVANCED .7 DNX12 (GAUZE/BANDAGES/DRESSINGS) ×1 IMPLANT
DERMABOND ADVANCED .7 DNX6 (GAUZE/BANDAGES/DRESSINGS) IMPLANT
DRAIN CHANNEL 19F RND (DRAIN) ×3 IMPLANT
DRAIN CONNECTOR BLAKE 1:1 (MISCELLANEOUS) ×1 IMPLANT
DRAPE WARM FLUID 44X44 (DRAPES) ×2 IMPLANT
ELECT BLADE 4.0 EZ CLEAN MEGAD (MISCELLANEOUS) ×2
ELECT CAUTERY BLADE 6.4 (BLADE) ×2 IMPLANT
ELECT REM PT RETURN 9FT ADLT (ELECTROSURGICAL) ×2
ELECTRODE BLDE 4.0 EZ CLN MEGD (MISCELLANEOUS) IMPLANT
ELECTRODE REM PT RTRN 9FT ADLT (ELECTROSURGICAL) ×1 IMPLANT
EVACUATOR SILICONE 100CC (DRAIN) ×1 IMPLANT
GAUZE 4X4 16PLY ~~LOC~~+RFID DBL (SPONGE) ×1 IMPLANT
GAUZE SPONGE 4X4 12PLY STRL (GAUZE/BANDAGES/DRESSINGS) ×2 IMPLANT
GLOVE SURG ENC MOIS LTX SZ7 (GLOVE) ×4 IMPLANT
GLOVE SURG ENC MOIS LTX SZ7.5 (GLOVE) IMPLANT
GLOVE SURG MICRO LTX SZ6.5 (GLOVE) ×3 IMPLANT
GOWN STRL REUS W/ TWL LRG LVL3 (GOWN DISPOSABLE) ×3 IMPLANT
GOWN STRL REUS W/ TWL XL LVL3 (GOWN DISPOSABLE) ×2 IMPLANT
GOWN STRL REUS W/TWL LRG LVL3 (GOWN DISPOSABLE) ×4
GOWN STRL REUS W/TWL XL LVL3 (GOWN DISPOSABLE) ×4
INSERT SUTURE HOLDER (MISCELLANEOUS) IMPLANT
KIT BASIN OR (CUSTOM PROCEDURE TRAY) ×2 IMPLANT
KIT SUCTION CATH 14FR (SUCTIONS) ×1 IMPLANT
KIT TURNOVER KIT B (KITS) ×2 IMPLANT
L-HOOK LAP DISP 36CM (ELECTROSURGICAL) ×2
LHOOK LAP DISP 36CM (ELECTROSURGICAL) IMPLANT
NEEDLE 22X1 1/2 (OR ONLY) (NEEDLE) ×1 IMPLANT
NS IRRIG 1000ML POUR BTL (IV SOLUTION) ×4 IMPLANT
PACK CHEST (CUSTOM PROCEDURE TRAY) ×2 IMPLANT
PACK UNIVERSAL I (CUSTOM PROCEDURE TRAY) ×2 IMPLANT
PAD ELECT DEFIB RADIOL ZOLL (MISCELLANEOUS) ×2 IMPLANT
POSITIONER HEAD DONUT 9IN (MISCELLANEOUS) ×2 IMPLANT
SPONGE T-LAP 18X18 ~~LOC~~+RFID (SPONGE) ×1 IMPLANT
SUT MNCRL AB 4-0 PS2 18 (SUTURE) IMPLANT
SUT SILK  1 MH (SUTURE) ×4
SUT SILK 1 MH (SUTURE) ×1 IMPLANT
SUT VIC AB 3-0 SH 18 (SUTURE) ×2 IMPLANT
SUT VICRYL 0 UR6 27IN ABS (SUTURE) IMPLANT
SYR 20ML LL LF (SYRINGE) ×1 IMPLANT
SYSTEM SAHARA CHEST DRAIN ATS (WOUND CARE) IMPLANT
TAPE CLOTH SURG 4X10 WHT LF (GAUZE/BANDAGES/DRESSINGS) ×1 IMPLANT
TOWEL GREEN STERILE (TOWEL DISPOSABLE) ×2 IMPLANT
TOWEL GREEN STERILE FF (TOWEL DISPOSABLE) ×2 IMPLANT
TRAP SPECIMEN MUCUS 40CC (MISCELLANEOUS) ×1 IMPLANT
TROCAR XCEL NON-BLD 5MMX100MML (ENDOMECHANICALS) ×5 IMPLANT
TUBING LAP HI FLOW INSUFFLATIO (TUBING) ×2 IMPLANT
UNDERPAD 30X36 HEAVY ABSORB (UNDERPADS AND DIAPERS) ×2 IMPLANT
WATER STERILE IRR 1000ML POUR (IV SOLUTION) ×4 IMPLANT

## 2021-04-02 NOTE — Anesthesia Procedure Notes (Signed)
Procedure Name: Intubation Date/Time: 04/02/2021 7:46 AM Performed by: Rande Brunt, CRNA Pre-anesthesia Checklist: Patient identified, Emergency Drugs available, Suction available and Patient being monitored Patient Re-evaluated:Patient Re-evaluated prior to induction Oxygen Delivery Method: Circle System Utilized Preoxygenation: Pre-oxygenation with 100% oxygen Induction Type: IV induction and Rapid sequence Laryngoscope Size: Mac and 3 Grade View: Grade I Tube type: Oral Endobronchial tube: Double lumen EBT and 37 Fr Number of attempts: 1 Airway Equipment and Method: Stylet Placement Confirmation: ETT inserted through vocal cords under direct vision, positive ETCO2 and breath sounds checked- equal and bilateral Tube secured with: Tape Dental Injury: Teeth and Oropharynx as per pre-operative assessment

## 2021-04-02 NOTE — Progress Notes (Signed)
PT Cancellation Note  Patient Details Name: Nicole Rush MRN: [id number] DOB: May 10, 1940   Cancelled Treatment:    Reason Eval/Treat Not Completed: Patient at procedure or test/unavailable - to OR today for R VATS, pericardial window. Will follow-up for PT treatment post-op.  Mabeline Caras, PT, DPT Acute Rehabilitation Services  Pager [phone] Office [place] 04/02/2021, 7:31 AM

## 2021-04-02 NOTE — Transfer of Care (Signed)
Immediate Anesthesia Transfer of Care Note  Patient: Nicole Rush  Procedure(s) Performed: PERICARDIAL WINDOW WITH THORACOTOMY APPROACH (Chest)  Patient Location: PACU  Anesthesia Type:General  Level of Consciousness: awake, alert  and drowsy  Airway & Oxygen Therapy: Patient Spontanous Breathing  Post-op Assessment: Report given to RN, Post -op Vital signs reviewed and stable and Patient moving all extremities  Post vital signs: Reviewed and stable  Last Vitals:  Vitals Value Taken Time  BP 121/70 04/02/21 0855  Temp    Pulse 77   Resp 18 04/02/21 0855  SpO2 95   Vitals shown include unvalidated device data.  Last Pain:  Vitals:   04/02/21 0425  TempSrc: Oral  PainSc: 0-No pain      Patients Stated Pain Goal: 2 (27/06/23 7628)  Complications: No notable events documented.

## 2021-04-02 NOTE — Progress Notes (Signed)
     [address]       [place] [address]             [phone]       No events  Vitals:   04/01/21 2346 04/02/21 0425  BP:  97/68  Pulse:  100  Resp: 20 20  Temp:  98.7 F (37.1 C)  SpO2:     Alert NAD Sinus Mild increase WOB  OR today for R VATS, pericardial window  [id number]

## 2021-04-02 NOTE — Anesthesia Procedure Notes (Signed)
Central Venous Catheter Insertion Performed by: Roderic Palau, MD, anesthesiologist Start/End12/13/2022 7:00 AM, 04/02/2021 7:15 AM Patient location: Pre-op. Preanesthetic checklist: patient identified, IV checked, site marked, risks and benefits discussed, surgical consent, monitors and equipment checked, pre-op evaluation, timeout performed and anesthesia consent Position: Trendelenburg Lidocaine 1% used for infiltration and patient sedated Hand hygiene performed , maximum sterile barriers used  and Seldinger technique used Catheter size: 8 Fr Total catheter length 16. Central line was placed.Double lumen Procedure performed using ultrasound guided technique. Ultrasound Notes:anatomy identified, needle tip was noted to be adjacent to the nerve/plexus identified, no ultrasound evidence of intravascular and/or intraneural injection and image(s) printed for medical record Attempts: 1 Following insertion, dressing applied, line sutured and Biopatch. Post procedure assessment: blood return through all ports and free fluid flow  Patient tolerated the procedure well with no immediate complications.

## 2021-04-02 NOTE — Progress Notes (Signed)
PROGRESS NOTE  Nicole Rush RKY:[id number] DOB: Oct 20, 1940   PCP: Lin Landsman, MD  Patient is from: Home  DOA: 03/21/2021 LOS: 21  Chief complaints:  Chief Complaint  Patient presents with   Chest Pain     Brief Narrative / Interim history: 80 year old F with PMH of asthma, cancer, HTN, CVA and recent flu infection presenting with 1 to 2 weeks of chest pain with associated dyspnea, nausea and vomiting, and admitted with pericarditis with trivial pericardial effusion and 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  She was febrile with significant leukocytosis.  CXR with poor inspiration but no active disease.  EKG with diffuse ST changes.  Cardiology consulted.  TTE with LVEF of 65 to 70%, indeterminate DD, moderate TVR and trivial pericardial effusion.  Patient was started on prednisone and colchicine for postviral pericarditis.  Spiked another fever the morning of 12/6.  Infectious work-up unrevealing.  Empirically started on ceftriaxone.  Afebrile since 12/9 but persistent leukocytosis.  Patient had intermittent chest pain.  Repeat TTE on 12/12 showed large pericardial effusion with tamponade.  She underwent VATs and pericardial window on 12/13  Subjective: Seen and examined earlier this morning after she returned from surgery.  Patient's daughters at bedside.  She says she feels "much better" and appreciates the care.  She denies shortness of breath although requiring 3 L to maintain saturation in the low 90s.  Objective: Vitals:   04/02/21 0955 04/02/21 1010 04/02/21 1025 04/02/21 1200  BP: 135/76 132/80 122/72 122/76  Pulse: 84 86 84 89  Resp: 18 19 16 20   Temp: 97.8 F (36.6 C)  97.8 F (36.6 C) 97.8 F (36.6 C)  TempSrc:    Oral  SpO2: 97% 97% 96% 91%  Weight:      Height:        Intake/Output Summary (Last 24 hours) at 04/02/2021 1333 Last data filed at 04/02/2021 1000 Gross per 24 hour  Intake 700 ml  Output 265 ml  Net 435 ml   Filed Weights   03/23/21 1800  Weight: 43.3 kg     Examination:  GENERAL: Frail looking elderly female.  No apparent distress. HEENT: MMM.  Vision and hearing grossly intact.  NECK: Supple.  Some JVD noted. RESP: 92% on 3 L.  No IWOB.  Fair aeration bilaterally.  Chest tube and JP drain in place. CVS:  RRR. Heart sounds normal.  ABD/GI/GU: BS+. Abd soft, NTND.  MSK/EXT:  Moves extremities.  No edema.  Significant muscle mass and subcu fat loss. SKIN: no apparent skin lesion or wound NEURO: Awake and alert. Oriented appropriately.  No apparent focal neuro deficit. PSYCH: Calm. Normal affect.   Procedures:  12/13-chest tube placement and pericardial window by cardiothoracic surgery  Microbiology summarized: 03/22/2021-Influenza A positive 03/22/2021-COVID-19 PCR negative. 03/26/2021-blood cultures NGTD. 03/26/2021-urine culture with multiple species 04/02/2021-pericardial fluid culture pending.  Assessment & Plan: Left-sided chest pain due to pericarditis/large pericardial effusion with cardiac tamponade:felt to be postviral from influenza A infection. TTE as above. S/p VATs and pericardial window by CVTS on 12/13. -CVTS following. -Chest tube and JP drain in place -Continue colchicine and prednisone -Follow-up pericardial fluid cultures  Influenza A: Initially diagnosed on 11/27. -Completed course of Tamiflu. -Supportive care with incentive telemetry, OOB, mucolytic's and antitussive  Fever: 102.8 on 12/6, 101 on 12/8 and 100.4 on 12/9.   Blood culture, CXR x2 and UA unrevealing.  Urine culture with multiple species.  Could be from her pericarditis. No further fever since 12/9.  Empirically started on IV ceftriaxone. -  Continue monitoring -Continue IV ceftriaxone empirically for a total of 5 days  Acute respiratory failure with hypoxia: Likely from influenza A andpossible atelectasis.  Resolved. -Manage pericarditis as above -Continue Xopenex and Atrovent every 6 hours -Mucolytic's and antitussive, incentive spirometry,  OOB/PT/OT. -PCCM recommended outpatient follow-up for calcified granuloma in left lung.  Elevated troponin: Felt to be due to pericarditis versus ACS. -Management as above  Lactic acidosis: Likely due to poor perfusion in the setting of tamponade from pericardial effusion.  -Recheck later today  Sinus tachycardia: Resolved.   Hyponatremia/hypophosphatemia/hypokalemia/hypomagnesemia: Resolved.  Elevated liver enzymes: Pattern suggests rhabdo or EtOH but CK within normal. Recent Labs  Lab 03/27/21 0544 03/28/21 0313 03/29/21 0426 03/30/21 0040 03/31/21 0053 04/02/21 0037  AST 111* 96* 57*  --  50* 42*  ALT 55* 62* 50*  --  46* 50*  ALKPHOS 80 78 94  --  84 90  BILITOT 0.8 0.9 0.9  --  0.5 0.9  PROT 5.8* 6.0* 6.3*  --  6.3* 6.8  ALBUMIN 1.8* 1.8*   1.8* 1.9* 1.9* 1.8*   1.9* 2.2*  -Continue holding Crestor -Recheck intermittently.  Essential hypertension: Normotensive. -Continue holding home losartan and HCTZ  Microcytic anemia: H&H relatively stable.  Anemia panel consistent with anemia of chronic disease. Recent Labs    03/24/21 0149 03/25/21 0029 03/26/21 0303 03/27/21 0544 03/28/21 0313 03/29/21 0426 03/30/21 0109 03/31/21 0053 04/01/21 0046 04/02/21 0037  HGB 9.8* 9.1* 12.1 9.8* 10.1* 10.5* 9.6* 9.6* 9.9* 11.4*  -Monitor intermittently  Urinary Retention?  Resolved. -Intermittent bladder scan as needed   Metabolic Acidosis: Improved. -Monitor intermittently   Thrombocytosis: Resolved  Hyperbilirubinemia: Resolved.   Constipation -Bowel regimen  Generalized weakness/physical deconditioning  -PT/OT  Leukocytosis/bandemia: Likely due to #1 and a steroid.  Already on antibiotic. -Continue monitoring  Severe malnutrition: As evidenced by low BMI for age, muscle mass and subcu fat loss and poor p.o. intake. Body mass index is 18.04 kg/m. Nutrition Problem: Severe Malnutrition Etiology: chronic illness Signs/Symptoms: severe muscle depletion,  severe fat depletion Interventions: Ensure Enlive (each supplement provides 350kcal and 20 grams of protein), Refer to RD note for recommendations, Liberalize Diet   DVT prophylaxis:  enoxaparin (LOVENOX) injection 30 mg Start: 04/02/21 2200 SCD's Start: 04/02/21 1045 Place and maintain sequential compression device Start: 04/01/21 1419 SCDs Start: 03/22/21 0304  Code Status: Full code Family Communication: Updated patient's daughters at the bedside. Level of care: Progressive Status is: Inpatient  Remains inpatient appropriate because: Chest tube and JP drain for pericardial effusion   Final disposition: To be determined.    Consultants:  Cardiology Cardiothoracic surgery   Sch Meds:  Scheduled Meds:  acetaminophen  1,000 mg Oral Q6H   Or   acetaminophen (TYLENOL) oral liquid 160 mg/5 mL  1,000 mg Oral Q6H   bisacodyl  10 mg Oral Daily   colchicine  0.6 mg Oral Daily   enoxaparin (LOVENOX) injection  30 mg Subcutaneous Daily   feeding supplement  237 mL Oral BID BM   fentaNYL       guaiFENesin  1,200 mg Oral BID   loratadine  10 mg Oral Daily   melatonin  5 mg Oral QHS   pantoprazole  40 mg Oral Daily   polyethylene glycol  17 g Oral BID   predniSONE  15 mg Oral Q breakfast   Followed by   Derrill Memo ON 04/07/2021] predniSONE  10 mg Oral Q breakfast   Followed by   Derrill Memo ON 04/14/2021] predniSONE  5 mg Oral Q breakfast   Followed by   Derrill Memo ON 04/21/2021] predniSONE  2.5 mg Oral Q breakfast   senna-docusate  1 tablet Oral BID   Continuous Infusions:  cefTRIAXone (ROCEPHIN)  IV 1 g (04/01/21 1504)   PRN Meds:.acetaminophen **OR** acetaminophen, fentaNYL (SUBLIMAZE) injection, hydrOXYzine, ipratropium, levalbuterol, ondansetron **OR** ondansetron (ZOFRAN) IV, ondansetron (ZOFRAN) IV, oxyCODONE  Antimicrobials: Anti-infectives (From admission, onward)    Start     Dose/Rate Route Frequency Ordered Stop   03/29/21 1400  cefTRIAXone (ROCEPHIN) 1 g in sodium chloride  0.9 % 100 mL IVPB        1 g 200 mL/hr over 30 Minutes Intravenous Every 24 hours 03/29/21 1221 04/03/21 1359   03/22/21 1000  oseltamivir (TAMIFLU) capsule 30 mg       Note to Pharmacy: Tamiflu 30 mg BID for CrCl <  60 mL/min   30 mg Oral 2 times daily 03/22/21 0304 03/23/21 2208        I have personally reviewed the following labs and images: CBC: Recent Labs  Lab 03/27/21 0544 03/28/21 0313 03/29/21 0426 03/30/21 0109 03/31/21 0053 04/01/21 0046 04/02/21 0037  WBC 15.3* 15.0* 19.1* 17.2* 14.7* 14.9* 21.3*  NEUTROABS 13.2* 12.6*  --   --   --   --  18.2*  HGB 9.8* 10.1* 10.5* 9.6* 9.6* 9.9* 11.4*  HCT 28.3* 29.6* 30.4* 28.3* 27.9* 29.2* 34.1*  MCV 74.1* 74.4* 73.8* 74.9* 74.4* 74.9* 75.3*  PLT 331 288 328 343 344 402* 522*   BMP &GFR Recent Labs  Lab 03/27/21 0544 03/28/21 0313 03/30/21 0040 03/30/21 0109 03/31/21 0053 04/02/21 0037  NA 137 136 134*  --  135 133*  K 3.3* 4.3 4.7  --  4.2 4.4  CL 103 105 104  --  102 101  CO2 23 22 19*  --  21* 20*  GLUCOSE 85 87 116*  --  118* 118*  BUN 24* 21 30*  --  25* 23  CREATININE 0.66 0.70 0.67  --  0.64 0.81  CALCIUM 7.7* 8.2* 8.3*  --  8.1* 8.5*  MG 1.7 2.1  --  2.1 1.9 1.8  PHOS 2.8 2.1* 2.9  --  2.2* 3.3   Estimated Creatinine Clearance: 36.6 mL/min (by C-G formula based on SCr of 0.81 mg/dL). Liver & Pancreas: Recent Labs  Lab 03/27/21 0544 03/28/21 0313 03/29/21 0426 03/30/21 0040 03/31/21 0053 04/02/21 0037  AST 111* 96* 57*  --  50* 42*  ALT 55* 62* 50*  --  46* 50*  ALKPHOS 80 78 94  --  84 90  BILITOT 0.8 0.9 0.9  --  0.5 0.9  PROT 5.8* 6.0* 6.3*  --  6.3* 6.8  ALBUMIN 1.8* 1.8*   1.8* 1.9* 1.9* 1.8*   1.9* 2.2*   No results for input(s): LIPASE, AMYLASE in the last 168 hours.  No results for input(s): AMMONIA in the last 168 hours. Diabetic: No results for input(s): HGBA1C in the last 72 hours. No results for input(s): GLUCAP in the last 168 hours.  Cardiac Enzymes: Recent Labs  Lab  03/27/21 1430  CKTOTAL 92   No results for input(s): PROBNP in the last 8760 hours. Coagulation Profile: No results for input(s): INR, PROTIME in the last 168 hours. Thyroid Function Tests: No results for input(s): TSH, T4TOTAL, FREET4, T3FREE, THYROIDAB in the last 72 hours. Lipid Profile: No results for input(s): CHOL, HDL, LDLCALC, TRIG, CHOLHDL, LDLDIRECT in the last 72 hours. Anemia Panel: No results  for input(s): VITAMINB12, FOLATE, FERRITIN, TIBC, IRON, RETICCTPCT in the last 72 hours. Urine analysis:    Component Value Date/Time   COLORURINE ORANGE (A) 03/26/2021 0816   APPEARANCEUR CLEAR 03/26/2021 0816   LABSPEC 1.025 03/26/2021 0816   PHURINE 6.0 03/26/2021 0816   GLUCOSEU NEGATIVE 03/26/2021 0816   HGBUR SMALL (A) 03/26/2021 0816   BILIRUBINUR NEGATIVE 03/26/2021 0816   KETONESUR NEGATIVE 03/26/2021 0816   PROTEINUR 30 (A) 03/26/2021 0816   NITRITE NEGATIVE 03/26/2021 0816   LEUKOCYTESUR NEGATIVE 03/26/2021 0816   Sepsis Labs: Invalid input(s): PROCALCITONIN, [place]  Microbiology: Recent Results (from the past 240 hour(s))  Urine Culture     Status: Abnormal   Collection Time: 03/26/21  8:17 AM   Specimen: Urine, Clean Catch  Result Value Ref Range Status   Specimen Description URINE, CLEAN CATCH  Final   Special Requests   Final    NONE Performed at [facility], [address] [place], [place] [address]    Culture MULTIPLE SPECIES PRESENT, SUGGEST RECOLLECTION (A)  Final   Report Status 03/28/2021 FINAL  Final  Culture, blood (routine x 2)     Status: None   Collection Time: 03/26/21  9:27 AM   Specimen: BLOOD RIGHT HAND  Result Value Ref Range Status   Specimen Description BLOOD RIGHT HAND  Final   Special Requests   Final    AEROBIC BOTTLE ONLY Blood Culture results may not be optimal due to an inadequate volume of blood received in culture bottles   Culture   Final    NO GROWTH 5 DAYS Performed at [facility], [place] [address] [place], [place] [address]    Report Status 03/31/2021 FINAL  Final  Culture, blood (routine x 2)     Status: None   Collection Time: 03/26/21 10:33 AM   Specimen: BLOOD RIGHT HAND  Result Value Ref Range Status   Specimen Description BLOOD RIGHT HAND  Final   Special Requests   Final    AEROBIC BOTTLE ONLY Blood Culture results may not be optimal due to an inadequate volume of blood received in culture bottles   Culture   Final    NO GROWTH 5 DAYS Performed at [facility], [place] [address], [place], [place] [address]    Report Status 03/31/2021 FINAL  Final  Surgical pcr screen     Status: None   Collection Time: 04/02/21  5:57 AM   Specimen: Nasal Mucosa; Nasal Swab  Result Value Ref Range Status   MRSA, PCR NEGATIVE NEGATIVE Final   Staphylococcus aureus NEGATIVE NEGATIVE Final    Comment: (NOTE) The Xpert SA Assay (FDA approved for NASAL specimens in patients 86 years of age and older), is one component of a comprehensive surveillance program. It is not intended to diagnose infection nor to guide or monitor treatment. Performed at [facility], [place] [address] [place], [place] [address]     Radiology Studies: DG Chest Port 1 View  Result Date: 04/02/2021 CLINICAL DATA:  Status post pericardial window with thoracotomy EXAM: PORTABLE CHEST 1 VIEW COMPARISON:  04/01/2021 FINDINGS: Unchanged mild cardiomegaly. Minimal left basilar atelectasis. Lungs otherwise clear. No pulmonary vascular congestion. Interval placement of right IJ central venous catheter which terminates at the cavoatrial junction. Mediastinal drain is seen. There is a right chest tube also in place. No pneumothorax. IMPRESSION: Unchanged mild cardiomegaly and mild left basilar atelectasis. Electronically Signed   By: Danie Binder.D.  On: 04/02/2021 12:47   ECHOCARDIOGRAM LIMITED  Result Date: 04/01/2021    ECHOCARDIOGRAM LIMITED REPORT   Patient Name:   Nicole Rush Date of Exam: 04/01/2021  Medical Rec #:  [id number]          Height:       61.0 in Accession #:    [id number]         Weight:       95.5 lb Date of Birth:  1940-12-06           BSA:          1.379 m Patient Age:    72 years           BP:           118/82 mmHg Patient Gender: F                  HR:           106 bpm. Exam Location:  Inpatient Procedure: Limited Echo and Limited Color Doppler Indications:    Chest Pain  History:        Patient has prior history of Echocardiogram examinations, most                 recent 03/24/2021. Risk Factors:Dyslipidemia. Breast CA.  Sonographer:    Wenda Low Referring Phys: [id number] Charlise Giovanetti T Sohrab Keelan IMPRESSIONS  1. There is a large pericardial effusion locately predominately over the anterior aspect of the RV (up to 1.6 cm). It measures posterior to the LV (0.9 cm). This is new from the prior echo. There is subtle RV collapse in diastole. Poor PW tracing of MV inflow. The IVC is dilated ~2.1 cm with >50 collapse. Hepatic vein PW is suggestive of late diastolic reversal with inspiration. The findings could represent early tamponade physiology especially given the change in size of the effusion. Clinical correlation is recommended. Large pericardial effusion. The pericardial effusion is anterior to the right ventricle.  2. Left ventricular ejection fraction, by estimation, is 70 to 75%. The left ventricle has hyperdynamic function. The left ventricle has no regional wall motion abnormalities. There is mild concentric left ventricular hypertrophy.  3. Right ventricular systolic function is normal. The right ventricular size is normal.  4. The aortic valve is tricuspid. Comparison(s): Changes from prior study are noted. Effusion is larger compared with prior. FINDINGS  Left Ventricle: Left ventricular ejection fraction, by estimation, is 70 to 75%. The left ventricle has hyperdynamic function. The left ventricle has no regional wall motion abnormalities. The left ventricular internal cavity size was small.  There is mild concentric left ventricular hypertrophy. Right Ventricle: The right ventricular size is normal. No increase in right ventricular wall thickness. Right ventricular systolic function is normal. Left Atrium: Left atrial size was normal in size. Right Atrium: Right atrial size was normal in size. Pericardium: There is a large pericardial effusion locately predominately over the anterior aspect of the RV (up to 1.6 cm). It measures posterior to the LV (0.9 cm). This is new from the prior echo. There is subtle RV collapse in diastole. Poor PW tracing of MV inflow. The IVC is dilated ~2.1 cm with >50 collapse. Hepatic vein PW is suggestive of late diastolic reversal with inspiration. The findings could represent early tamponade physiology especially given the change in size of the effusion. Clinical correlation is recommended. A large pericardial effusion is present. The pericardial effusion is anterior to the right ventricle. Aortic Valve: The aortic valve is tricuspid.  Pulmonic Valve: The pulmonic valve was grossly normal. Aorta: The aortic root is normal in size and structure. IAS/Shunts: The atrial septum is grossly normal. LEFT VENTRICLE PLAX 2D LVIDd:         2.60 cm LVIDs:         1.40 cm LV PW:         1.20 cm LV IVS:        1.20 cm LVOT diam:     1.90 cm LVOT Area:     2.84 cm  LEFT ATRIUM             Index LA diam:        2.50 cm 1.81 cm/m LA Vol (A2C):   55.3 ml 40.09 ml/m LA Vol (A4C):   23.3 ml 16.89 ml/m LA Biplane Vol: 38.5 ml 27.91 ml/m   AORTA Ao Root diam: 2.70 cm  SHUNTS Systemic Diam: 1.90 cm Eleonore Chiquito MD Electronically signed by Eleonore Chiquito MD Signature Date/Time: 04/01/2021/4:06:42 PM    Final        Cambreigh Dearing T. Vilas  If 7PM-7AM, please contact night-coverage [email or web address] 04/02/2021, 1:33 PM

## 2021-04-02 NOTE — Progress Notes (Signed)
Physical Therapy Treatment Patient Details Name: Nicole Rush MRN: [id number] DOB: 10-Feb-1941 Today's Date: 04/02/2021   History of Present Illness Pt is an 80 y.o. female admitted 03/21/21 with chest pain. Upon work up, suspect pericarditis likely related to recent flu dx. Afebrile since 12/9 but persistent leukocytosis. Repeat TTE 12/12 showed large pericardial effusion with tamponade. S/p VATs and pericardial window on 12/13. PMH includes asthma, breast cancer, HTN, CVA (no residual deficits).   PT Comments    Pt now s/p VATs with pericardial window this morning. Pt limited by fatigue/lethargy this afternoon; session focused on pt and family education as pt declining SNF with plans to d/c home with family support. Pt's daughter Nicole Rush) present and supportive. Hopeful pt will progress well with mobility post-op; will follow-up tomorrow for activity progression; pt reports motivated to participate.     Recommendations for follow up therapy are one component of a multi-disciplinary discharge planning process, led by the attending physician.  Recommendations may be updated based on patient status, additional functional criteria and insurance authorization.  Follow Up Recommendations  Home health PT (declined SNF)     Assistance Recommended at Discharge Frequent or constant Supervision/Assistance  Equipment Recommendations  Rolling walker (2 wheels);BSC/3in1;Wheelchair (measurements PT);Wheelchair cushion (measurements PT);Hospital bed    Recommendations for Other Services       Precautions / Restrictions Precautions Precautions: Fall;Other (comment) Precaution Comments: chest tube, pleural drain; watch SpO2 Restrictions Weight Bearing Restrictions: No     Mobility  Bed Mobility               General bed mobility comments:  (pt declined OOB having just gotten back to bed with nursing staff after using [facility])    Transfers                         Ambulation/Gait                   Stairs             Wheelchair Mobility    Modified Rankin (Stroke Patients Only)       Balance                                            Cognition Arousal/Alertness: Awake/alert;Lethargic;Suspect due to medications Behavior During Therapy: Flat affect Overall Cognitive Status: Difficult to assess                                 General Comments: Pt initially awake/alert, becoming more lethargic during session suspect related to pain meds (received just before session per RN). pt deferring majority of questions to daughter who was present in room        Exercises Other Exercises Other Exercises: Nicole Rush HEP handout (Access Code [email or web address]) provided for pt to perform this evening and tomorrow evening before PT session - ankle pumps, heel slides, partial SLR (pt and daughter educated on these)    General Comments General comments (skin integrity, edema, etc.): Pt's youngest daughter Nicole Rush) present and supportive - confirmed plan for pt to d/c home instead of SNF, pt will d/c to grandaughter's home (Nicole Rush's niece) will family available to assist as needed. Pt will have to ascend 2 flights of stairs to 3rd floor apt; discussed potential need for w/c to  be bumped up steps with assist from family as well as for community distances; daughter understands potential need for this. Hospital bed, walker and 3in1 being delivered to apartment this week. Provided pt and daughter with energy conservation and LE therex handout. Pt falling asleep at times during discussion, but daughter attentive and supportive. Agreeable to [facility] therapy services      Pertinent Vitals/Pain Pain Assessment: Faces Faces Pain Scale: Hurts little more Pain Location: Incision, chest tube insertion Pain Descriptors / Indicators: Guarding;Grimacing Pain Intervention(s): Limited activity within patient's tolerance;Premedicated before  session    Home Living                          Prior Function            PT Goals (current goals can now be found in the care plan section) Acute Rehab PT Goals Patient Stated Goal: Return home with assist from family PT Goal Formulation: With patient/family Time For Goal Achievement: 04/16/21 Potential to Achieve Goals: Good Progress towards PT goals: Not progressing toward goals - comment (fatigue post-op VATs today)    Frequency    Min 3X/week      PT Plan Discharge plan needs to be updated;Frequency needs to be updated    Co-evaluation              AM-PAC PT "6 Clicks" Mobility   Outcome Measure  Help needed turning from your back to your side while in a flat bed without using bedrails?: A Little Help needed moving from lying on your back to sitting on the side of a flat bed without using bedrails?: A Little Help needed moving to and from a bed to a chair (including a wheelchair)?: A Lot Help needed standing up from a chair using your arms (e.g., wheelchair or bedside chair)?: A Lot Help needed to walk in hospital room?: A Lot Help needed climbing 3-5 steps with a railing? : A Lot 6 Click Score: 14    End of Session   Activity Tolerance: Patient limited by fatigue;Patient limited by lethargy Patient left: in bed;with call bell/phone within reach;with bed alarm set;with family/visitor present Nurse Communication: Mobility status PT Visit Diagnosis: Other abnormalities of gait and mobility (R26.89);Muscle weakness (generalized) (M62.81)     Time: 2637-8588 PT Time Calculation (min) (ACUTE ONLY): 17 min  Charges:  $Self Care/Home Management: [place], PT, DPT Acute Rehabilitation Services  Pager [phone] Office [place] 04/02/2021, 5:22 PM

## 2021-04-02 NOTE — Progress Notes (Signed)
VAST consult received to assess patient's CVC, not giving blood return. Upon arrival at patient's bedside had patient turn her head toward her left side, changed caps on both lumens; able to obtain blood return from both lumens and flush with NS easily. Slight bloody residue inside distal lumen which will not flush through.

## 2021-04-02 NOTE — Consult Note (Addendum)
Consultation Note Date: 04/02/2021   Patient Name: Nicole Rush  DOB: March 02, 1941  MRN: [id number]  Age / Sex: 80 y.o., female  PCP: Lin Landsman, MD Referring Physician: Mercy Riding, MD  Reason for Consultation: Establishing goals of care  HPI/Patient Profile: 80 y.o. female  with past medical history of asthma, HTN, CVA, cancer, and recent influenza admitted on 03/21/2021 with plaints of chest pain with dyspnea and nausea and vomiting for the last 1 to 2 weeks.  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  Urine also growing multiple species.  12/13 patient underwent pericardial window with thoracotomy approach.  Pulm medicine was consulted to discuss goals of care.  Clinical Assessment and Goals of Care: I have reviewed medical records including EPIC notes, labs and imaging, assessed the patient and then met with patient and patient's daughter [name] to discuss diagnosis prognosis, [place], EOL wishes, disposition and options.  I introduced Palliative Medicine as specialized medical care for people living with serious illness. It focuses on providing relief from the symptoms and stress of a serious illness. The goal is to improve quality of life for both the patient and the family.  We discussed a brief life review of the patient.  Patient endorses "working, working quite, and working all her life".  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  She has 6 children, several grandchildren, and great-grandchildren.  She speaks of her youngest great grandchild who is 41 months old.  She is ready to "get up out of the bed to go squeeze that fat baby".  As far as functional and nutritional status prior to admission patient was independent with all ADLs.  She was living alone, walking twice daily, taking treats all the dogs in her neighborhood, cooking  all of her meals as well as driving herself.  We discussed patient's current illness and what it means in the larger context of patient's on-going co-morbidities.  I attempted to elicit values and goals of care important to the patient.  Patient relies heavily on her faith.  She reiterates that God is in charge and she just needs to listen to him.  Advance directives and concepts specific to code status were considered and discussed.  Patient verbalized that should she not be able to speak for herself that she would want her daughter [name] to be her healthcare power of attorney.  No advance care documentation has been put in place to date.  [name] shares she is interested in creating this documentation and getting it notarized.  [name] also commented that the patient said she did not want to be resuscitated when she first entered the hospital.  I shared that as it stands patient is a full code and should her heart and her lungs stop then we would perform CPR.  [name] said that she would not want this.  Patient interjected by saying she did not want to talk about it and that everything is left to God's hands.  I  shared that what it sounded like she was saying was that she wanted to allow a natural death and be a DNR.  However, [name] shared she wants everything to remain a full code and that she will contact PMT should she want to change her mother's CODE STATUS.  Patient agreed with [name] and said she defers all of these discussions to her daughter.  I made one last clarification to say that the patient wanted to remain a full code and both patient and daughter were in agreement.  Educated patient and patient's daughter that should they want to complete advanced directives then a spiritual care consult can be placed.  [name] declined at this time.   Discussed with patient/family the importance of continued conversation with family and the medical providers regarding overall plan of care and treatment options,  ensuring decisions are within the context of the patients values and GOCs.    I encouraged both the patient and [name] to have ongoing conversations regarding CODE STATUS since what the patient's wishes are do not clearly align with a full CODE STATUS.  [name] asked for PMD contact number and said she would be calling.  I assured the patient we would continue to round on her throughout her hospitalization and be available for any advance care planning or goals of care discussions.  Patient verbalized that her plan is to build her strength and go to live with her niece.  Patient and [name] are in agreement that patient will leave hospital and go to live with family instead of going to a rehabilitation facility.  Questions and concerns were addressed. The family was encouraged to call with questions or concerns.   Primary Decision Maker PATIENT  Code Status/Advance Care Planning: Full code  Prognosis:   Unable to determine  Discharge Planning: d/c to niece's home/care  Primary Diagnoses: Present on Admission:  Pericarditis  Protein-calorie malnutrition, moderate ([place])  Hyperlipidemia  Essential hypertension  Leukocytosis  Falls   Physical Exam Constitutional:      General: She is not in acute distress.    Appearance: She is not ill-appearing.  HENT:     Head: Normocephalic and atraumatic.  Cardiovascular:     Rate and Rhythm: Normal rate.  Pulmonary:     Effort: Pulmonary effort is normal.  Abdominal:     Palpations: Abdomen is soft.  Musculoskeletal:        General: Normal range of motion.  Skin:    General: Skin is warm and dry.     Comments: Surgical dressing intact with chest tube in place  Neurological:     Mental Status: She is alert and oriented to person, place, and time.  Psychiatric:        Mood and Affect: Mood normal. Mood is not anxious.        Behavior: Behavior normal. Behavior is not agitated.    Vital Signs: BP 122/76    Pulse 89    Temp 97.8 F (36.6  C) (Oral)    Resp 20    Ht $R'5\' 1"'XR$  (1.549 m)    Wt 43.3 kg    SpO2 91%    BMI 18.04 kg/m  Pain Scale: PAINAD POSS *See Group Information*: 1-Acceptable,Awake and alert Pain Score: Asleep SpO2: SpO2: 91 % O2 Device:SpO2: 91 % O2 Flow Rate: .O2 Flow Rate (L/min): 2 L/min  Palliative Assessment/Data: 60%     I discussed this patient's plan of care with patient, patient's daughter [name].  Thank you for this consult.  Palliative medicine will continue to follow and assist holistically.   Time Total: 70 minutes Greater than 50%  of this time was spent counseling and coordinating care related to the above assessment and plan.  Signed by: Jordan Hawks, DNP, FNP-BC Palliative Medicine    Please contact Palliative Medicine Team phone at [phone] for questions and concerns.  For individual provider: See Shea Evans

## 2021-04-02 NOTE — Anesthesia Procedure Notes (Signed)
Arterial Line Insertion Start/End12/13/2022 7:10 AM, 04/02/2021 7:15 AM Performed by: Rande Brunt, CRNA, CRNA  Patient location: Pre-op. Preanesthetic checklist: patient identified, IV checked, site marked, risks and benefits discussed, surgical consent, monitors and equipment checked, pre-op evaluation, timeout performed and anesthesia consent Lidocaine 1% used for infiltration Left, radial was placed Catheter size: 20 G Hand hygiene performed  and maximum sterile barriers used   Attempts: 2 Procedure performed using ultrasound guided technique. Ultrasound Notes:anatomy identified and needle tip was noted to be adjacent to the nerve/plexus identified Following insertion, Biopatch. Patient tolerated the procedure well with no immediate complications.

## 2021-04-02 NOTE — Anesthesia Postprocedure Evaluation (Signed)
Anesthesia Post Note  Patient: Nicole Rush  Procedure(s) Performed: PERICARDIAL WINDOW WITH THORACOTOMY APPROACH (Chest)     Patient location during evaluation: PACU Anesthesia Type: General Level of consciousness: awake and alert Pain management: pain level controlled Vital Signs Assessment: post-procedure vital signs reviewed and stable Respiratory status: spontaneous breathing, nonlabored ventilation, respiratory function stable and patient connected to nasal cannula oxygen Cardiovascular status: blood pressure returned to baseline and stable Postop Assessment: no apparent nausea or vomiting Anesthetic complications: no   No notable events documented.  Last Vitals:  Vitals:   04/02/21 1010 04/02/21 1025  BP: 132/80 122/72  Pulse: 86 84  Resp: 19 16  Temp:  36.6 C  SpO2: 97% 96%    Last Pain:  Vitals:   04/02/21 0940  TempSrc:   PainSc: Asleep                 Reinette Cuneo,W. EDMOND

## 2021-04-02 NOTE — Progress Notes (Signed)
OT Cancellation Note  Patient Details Name: Nicole Rush MRN: [id number] DOB: 1940/09/24   Cancelled Treatment:    Reason Eval/Treat Not Completed: Patient at procedure or test/ unavailable Off unit in OR this AM  Layla Maw 04/02/2021, 6:49 AM

## 2021-04-02 NOTE — Op Note (Signed)
      [address]       [place] [address]             [phone]        04/02/2021  Patient:  Nicole Rush Pre-Op Dx: Pericardial effusion Post-op Dx: Same Procedure: -Right video assisted thoracoscopy - Pericardial Window - Evacuation of pericardial effusion   Surgeon and Role:      * Shaunette Gassner, Lucile Crater, MD - Primary  Anesthesia  general EBL: 10 ml Blood Administration: None Specimen: Pericardium, pericardial fluid  Drains: 19 F Blake chest tube x2 in right chest and pericardium chest Counts: correct   Indications: This is an 80 year old female that presents with a anterior pericardial effusion.  Images were reviewed with cardiology and they do not feel that there is a window for pericardial drain placement.  She is quite frail however she is agreeable to proceed given her symptoms.  The risks and benefits of a right thoracoscopy with pericardial window have been discussed and she is agreeable to proceed.  Findings: Murky pericardial fluid.  No nodularities on the pericardium.  Fibrinous exudate along the epicardium of the heart  Operative Technique: After the risks, benefits and alternatives were thoroughly discussed, the patient was brought to the operative theatre.  Anesthesia was induced, and the patient was then placed in a lazy lateral decubitus position and was prepped and draped in normal sterile fashion.  An appropriate surgical pause was performed, and pre-operative antibiotics were dosed accordingly.  We began with 1cm incision in the anterior axillary line at the 4th intercostal space.  A 46mm trocar was then introduced.  The pleural cavity was then insufflated with Co2.  The 2 additional trocars were then introduced to triangulate the pericardium.  The pericardial sac was then incised, and a 3 x 3 cm window was created anterior to the phrenic nerve.  The pericardial fluid was murky.  The pericardium was removed, and 44F blake drain was  then passed into the pericardial sac.  We watch the remaining lobes re-expand.  The skin and soft tissue were closed with absorbable suture    The patient tolerated the procedure without any immediate complications, and was transferred to the PACU in stable condition.  Nicole Rush

## 2021-04-02 NOTE — Plan of Care (Signed)

## 2021-04-03 ENCOUNTER — Inpatient Hospital Stay (HOSPITAL_COMMUNITY): Payer: Medicare HMO

## 2021-04-03 DIAGNOSIS — I3139 Other pericardial effusion (noninflammatory): Secondary | ICD-10-CM

## 2021-04-03 LAB — COMPREHENSIVE METABOLIC PANEL
ALT: 31 U/L (ref 0–44)
AST: 25 U/L (ref 15–41)
Albumin: 1.8 g/dL — ABNORMAL LOW (ref 3.5–5.0)
Alkaline Phosphatase: 72 U/L (ref 38–126)
Anion gap: 9 (ref 5–15)
BUN: 20 mg/dL (ref 8–23)
CO2: 23 mmol/L (ref 22–32)
Calcium: 8.3 mg/dL — ABNORMAL LOW (ref 8.9–10.3)
Chloride: 100 mmol/L (ref 98–111)
Creatinine, Ser: 0.68 mg/dL (ref 0.44–1.00)
GFR, Estimated: >60 mL/min (ref >60)
Glucose, Bld: 102 mg/dL — ABNORMAL HIGH (ref 70–99)
Potassium: 3.7 mmol/L (ref 3.5–5.1)
Sodium: 132 mmol/L — ABNORMAL LOW (ref 135–145)
Total Bilirubin: 0.6 mg/dL (ref 0.3–1.2)
Total Protein: 5.8 g/dL — ABNORMAL LOW (ref 6.5–8.1)

## 2021-04-03 LAB — LACTIC ACID, PLASMA: Lactic Acid, Venous: 1.8 mmol/L (ref 0.5–1.9)

## 2021-04-03 LAB — CBC
HCT: 26.4 % — ABNORMAL LOW (ref 36.0–46.0)
Hemoglobin: 9.1 g/dL — ABNORMAL LOW (ref 12.0–15.0)
MCH: 25.3 pg — ABNORMAL LOW (ref 26.0–34.0)
MCHC: 34.5 g/dL (ref 30.0–36.0)
MCV: 73.5 fL — ABNORMAL LOW (ref 80.0–100.0)
Platelets: UNDETERMINED 10*3/uL (ref 150–400)
RBC: 3.59 MIL/uL — ABNORMAL LOW (ref 3.87–5.11)
RDW: 15.3 % (ref 11.5–15.5)
WBC: 16.9 10*3/uL — ABNORMAL HIGH (ref 4.0–10.5)
nRBC: 0 % (ref 0.0–0.2)

## 2021-04-03 LAB — FUNGAL STAIN REFLEX

## 2021-04-03 LAB — ECHOCARDIOGRAM LIMITED
Height: 61 in
Weight: 1527.35 oz

## 2021-04-03 LAB — ACID FAST SMEAR (AFB, MYCOBACTERIA): Acid Fast Smear: NEGATIVE

## 2021-04-03 LAB — FUNGUS STAIN

## 2021-04-03 LAB — SURGICAL PATHOLOGY

## 2021-04-03 MED ORDER — IBUPROFEN 200 MG PO TABS
800.0000 mg | ORAL_TABLET | Freq: Three times a day (TID) | ORAL | Status: DC
  Administered 2021-04-03 – 2021-04-06 (×10): 800 mg via ORAL
  Filled 2021-04-03 (×10): qty 4

## 2021-04-03 NOTE — Progress Notes (Signed)
°  °  Durable Medical Equipment  (From admission, onward)           Start     Ordered   04/03/21 1517  For home use only DME lightweight manual wheelchair with seat cushion  Once       Comments: Patient suffers from weakness which impairs their ability to perform daily activities like bathing, dressing, grooming, and toileting in the home.  A cane or crutch will not resolve  issue with performing activities of daily living. A wheelchair will allow patient to safely perform daily activities. Patient is not able to propel themselves in the home using a standard weight wheelchair due to general weakness. Patient can self propel in the lightweight wheelchair. Length of need Lifetime. Accessories: elevating leg rests (ELRs), wheel locks, extensions and anti-tippers.   04/03/21 1517   03/25/21 1659  For home use only DME Hospital bed  Once       Question Answer Comment  Length of Need 12 Months   Patient has (list medical condition): pericarditis with pericardial effusion   The above medical condition requires: Patient requires the ability to reposition frequently   Head must be elevated greater than: 30 degrees   Bed type Semi-electric   Support Surface: Gel Overlay      03/25/21 1659   03/25/21 1630  For home use only DME Walker rolling  Once       Question Answer Comment  Walker: With [place]   Patient needs a walker to treat with the following condition Weakness      03/25/21 1629   03/25/21 1630  For home use only DME Bedside commode  Once       Question:  Patient needs a bedside commode to treat with the following condition  Answer:  Weakness   03/25/21 1630

## 2021-04-03 NOTE — Progress Notes (Addendum)
° °   °  [address]       [place] [address]             [phone]      1 Day Post-Op Procedure(s) (LRB): PERICARDIAL WINDOW WITH THORACOTOMY APPROACH (N/A) Subjective: Sitting up eating breakfast, says she is feeling much better. Denies pain.   Objective: Vital signs in last 24 hours: Temp:  [97.8 F (36.6 C)-98.5 F (36.9 C)] 98 F (36.7 C) (12/14 0400) Pulse Rate:  [76-93] 83 (12/14 0400) Cardiac Rhythm: Normal sinus rhythm (12/14 0732) Resp:  [11-20] 20 (12/14 0400) BP: (90-142)/(63-84) 90/68 (12/14 0400) SpO2:  [91 %-100 %] 95 % (12/14 0400) Arterial Line BP: (128-156)/(64-74) 138/65 (12/13 1025) FiO2 (%):  [28 %] 28 % (12/13 0907)    Intake/Output from previous day: 12/13 0701 - 12/14 0700 In: 1060 [P.O.:360; I.V.:700] Out: 952 [Urine:600; Drains:120; Chest Tube:82] Intake/Output this shift: Total I/O In: -  Out: 16 [Chest Tube:16]  General appearance: alert, cooperative, and no distress Neurologic: intact Heart: regular rate and rhythm and Chest tube drained 176ml since surgery.  Wound: Chest tube and JP secure, exit sites are dry.   Lab Results: Recent Labs    04/01/21 0046 04/02/21 0037  WBC 14.9* 21.3*  HGB 9.9* 11.4*  HCT 29.2* 34.1*  PLT 402* 522*   BMET:  Recent Labs    04/02/21 0037 04/03/21 0645  NA 133* 132*  K 4.4 3.7  CL 101 100  CO2 20* 23  GLUCOSE 118* 102*  BUN 23 20  CREATININE 0.81 0.68  CALCIUM 8.5* 8.3*    PT/INR: No results for input(s): LABPROT, INR in the last 72 hours. ABG No results found for: PHART, HCO3, TCO2, ACIDBASEDEF, O2SAT CBG (last 3)  No results for input(s): GLUCAP in the last 72 hours.  Assessment/Plan: S/P Procedure(s) (LRB): PERICARDIAL WINDOW WITH THORACOTOMY APPROACH (N/A) -POD1 right VATS drainage of pericardial effusion related to pericarditis.  Pt. Says she is feeling much better. In stable SR.  Leave the drains in place for now. Mobilize.    LOS: 13 days    Antony Odea, [place] [phone] 04/03/2021   Agree with above Continue drainage per tubes  Lajuana Matte

## 2021-04-03 NOTE — Progress Notes (Signed)
Progress note:  Chart reviewed.  Assessed patient at bedside.  She is resting in bed in no apparent distress.  Chest tube in place draining serosanguineous fluid.  No family at bedside.  I called patient's daughter [name].  No answer and HIPAA appropriate voicemail left.  I will await a callback from family should they have any PMT needs. Palliative medicine team will continue to follow the patient throughout her hospitalization.  I am on service today and tomorrow.  I am available should the patient or family ask for PMT.  Clemons Ilsa Iha, FNP-BC Palliative Medicine Team Team Phone # [phone]  NO CHARGE

## 2021-04-03 NOTE — Plan of Care (Signed)
°  Problem: Clinical Measurements: Goal: Ability to maintain clinical measurements within normal limits will improve Outcome: Progressing Goal: Will remain free from infection Outcome: Progressing Goal: Diagnostic test results will improve Outcome: Progressing Goal: Respiratory complications will improve Outcome: Progressing Goal: Cardiovascular complication will be avoided Outcome: Progressing   Problem: Activity: Goal: Risk for activity intolerance will decrease Outcome: Progressing   Problem: Coping: Goal: Level of anxiety will decrease Outcome: Progressing

## 2021-04-03 NOTE — Progress Notes (Signed)
Occupational Therapy Treatment Patient Details Name: Nicole Rush MRN: [id number] DOB: 06-01-1940 Today's Date: 04/03/2021   History of present illness Pt is an 80 y.o. female admitted 03/21/21 with chest pain. Upon work up, suspect pericarditis likely related to recent flu dx. Afebrile since 12/9 but persistent leukocytosis. Repeat TTE 12/12 showed large pericardial effusion with tamponade. S/p VATs and pericardial window on 12/13. PMH includes asthma, breast cancer, HTN, CVA (no residual deficits).   OT comments  Pt with much improved cognition and physical abilities than previous OT session. Pt endorses feeling much better s/p procedure noted above. Pt received with difficulty eating lunch in bed, eager to get OOB to finish meal. Pt overall Min A for bed mobility and min guard for basic transfer using RW without LOB noted. Noted family/pt declining SNF rehab at this time though this is still recommending to maximize safety. Will continue to follow acutely and if continues to progress, may be appropriate for return home with family support and HHOT.  Spo2 97% on RA, HR WFL   Recommendations for follow up therapy are one component of a multi-disciplinary discharge planning process, led by the attending physician.  Recommendations may be updated based on patient status, additional functional criteria and insurance authorization.    Follow Up Recommendations  Skilled nursing-short term rehab (<3 hours/day)    Assistance Recommended at Discharge Frequent or constant Supervision/Assistance  Equipment Recommendations  BSC/3in1;Other (comment) (Rolling walker)    Recommendations for Other Services      Precautions / Restrictions Precautions Precautions: Fall;Other (comment) Precaution Comments: chest tube, pleural drain; watch SpO2 Restrictions Weight Bearing Restrictions: No       Mobility Bed Mobility Overal bed mobility: Needs Assistance Bed Mobility: Supine to Sit     Supine  to sit: Min assist;HOB elevated     General bed mobility comments: Min A to scoot hips to EOB    Transfers Overall transfer level: Needs assistance Equipment used: Rolling walker (2 wheels);None Transfers: Sit to/from Stand;Bed to chair/wheelchair/BSC Sit to Stand: Min guard   Step pivot transfers: Min guard       General transfer comment: able to stand and turn without assist using RW, minor cues for sequencing     Balance Overall balance assessment: Needs assistance Sitting-balance support: No upper extremity supported;Feet supported Sitting balance-Leahy Scale: Fair     Standing balance support: Bilateral upper extremity supported;Reliant on assistive device for balance Standing balance-Leahy Scale: Poor Standing balance comment: reliant on at least one UE support                           ADL either performed or assessed with clinical judgement   ADL Overall ADL's : Needs assistance/impaired Eating/Feeding: Set up;Sitting Eating/Feeding Details (indicate cue type and reason): assist to open containers and place within reach. Pt initially eating salad in bed with difficulty (unable to reach fork and was eating with her hands). Assisted to chair for improved positioning while eating, improved appetite noted             Upper Body Dressing : Minimal assistance Upper Body Dressing Details (indicate cue type and reason): pt wanted to don gloves (to decrease risk of germs in touching items), Min A to coordinate to correct fingers with pt able to assist in pulling gloves on                   General ADL Comments: Improving cognition, eagerness for  OOB participation and general movements since procedure 12/13    Extremity/Trunk Assessment Upper Extremity Assessment Upper Extremity Assessment: Generalized weakness   Lower Extremity Assessment Lower Extremity Assessment: Defer to PT evaluation        Vision   Vision Assessment?: No apparent visual  deficits   Perception     Praxis      Cognition Arousal/Alertness: Awake/alert Behavior During Therapy: WFL for tasks assessed/performed Overall Cognitive Status: Impaired/Different from baseline Area of Impairment: Problem solving;Memory;Safety/judgement;Awareness                     Memory: Decreased short-term memory   Safety/Judgement: Decreased awareness of safety Awareness: Emergent Problem Solving: Slow processing;Requires verbal cues General Comments: much improved appropriate interactions, follows directions consistently and improving awareness of safety/line mgmt. Still memory deficits noted but overall pt endorses feeling more like herself          Exercises     Shoulder Instructions       General Comments      Pertinent Vitals/ Pain       Pain Assessment: Faces Faces Pain Scale: Hurts a little bit Pain Location: generalized with movement Pain Descriptors / Indicators: Grimacing Pain Intervention(s): Monitored during session  Home Living                                          Prior Functioning/Environment              Frequency  Min 2X/week        Progress Toward Goals  OT Goals(current goals can now be found in the care plan section)  Progress towards OT goals: Progressing toward goals  Acute Rehab OT Goals Patient Stated Goal: go home soon OT Goal Formulation: With patient Time For Goal Achievement: 04/08/21 Potential to Achieve Goals: Good ADL Goals Pt Will Perform Grooming: with supervision;standing Pt Will Perform Upper Body Bathing: with supervision;sitting Pt Will Perform Lower Body Bathing: with supervision;sit to/from stand Pt Will Perform Upper Body Dressing: with supervision;sitting Pt Will Perform Lower Body Dressing: with supervision;sit to/from stand Pt Will Transfer to Toilet: with supervision;ambulating;bedside commode Additional ADL Goal #1: Pt will be independent in and OOB for basic  ADLs Additional ADL Goal #2: Pt will be aware of energy conservation strategies from handout that may be of benefit to her.  Plan Discharge plan remains appropriate    Co-evaluation                 AM-PAC OT "6 Clicks" Daily Activity     Outcome Measure   Help from another person eating meals?: A Little Help from another person taking care of personal grooming?: A Little Help from another person toileting, which includes using toliet, bedpan, or urinal?: A Lot Help from another person bathing (including washing, rinsing, drying)?: A Lot Help from another person to put on and taking off regular upper body clothing?: A Little Help from another person to put on and taking off regular lower body clothing?: A Lot 6 Click Score: 15    End of Session Equipment Utilized During Treatment: Rolling walker (2 wheels)  OT Visit Diagnosis: Unsteadiness on feet (R26.81);Other abnormalities of gait and mobility (R26.89);Muscle weakness (generalized) (M62.81)   Activity Tolerance Patient tolerated treatment well   Patient Left in chair;with call bell/phone within reach;with chair alarm set   Nurse Communication Mobility status  Time: 1207-1230 OT Time Calculation (min): 23 min  Charges: OT General Charges $OT Visit: 1 Visit OT Treatments $Self Care/Home Management : 8-22 mins $Therapeutic Activity: 8-22 mins  Malachy Chamber, OTR/L Acute Rehab Services Office: [phone]   Layla Maw 04/03/2021, 1:23 PM

## 2021-04-03 NOTE — Progress Notes (Signed)
Physical Therapy Treatment Patient Details Name: Nicole Rush MRN: [id number] DOB: 1941-03-21 Today's Date: 04/03/2021   History of Present Illness Pt is an 80 y.o. female admitted 03/21/21 with chest pain. Upon work up, suspect pericarditis likely related to recent flu dx. Afebrile since 12/9 but persistent leukocytosis. Repeat TTE 12/12 showed large pericardial effusion with tamponade. S/p VATs and pericardial window on 12/13. PMH includes asthma, breast cancer, HTN, CVA (no residual deficits).   PT Comments    Pt progressing with mobility, tolerated short bouts of ambulation with RW and min guard. Pt remains limited by generalized weakness, decreased activity tolerance, and impaired balance strategies/postural reactions. Pt has declined SNF with plans to d/c home with family support. Will continue to follow acutely to address established goals.     Recommendations for follow up therapy are one component of a multi-disciplinary discharge planning process, led by the attending physician.  Recommendations may be updated based on patient status, additional functional criteria and insurance authorization.  Follow Up Recommendations  Home health PT (declined SNF)     Assistance Recommended at Discharge Frequent or constant Supervision/Assistance  Equipment Recommendations  Rolling walker (2 wheels);BSC/3in1;Wheelchair (measurements PT);Wheelchair cushion (measurements PT);Hospital bed    Recommendations for Other Services       Precautions / Restrictions Precautions Precautions: Fall;Other (comment) Precaution Comments: chest tube, JP drain Restrictions Weight Bearing Restrictions: No     Mobility  Bed Mobility Overal bed mobility: Needs Assistance Bed Mobility: Supine to Sit;Sit to Supine     Supine to sit: Min assist;HOB elevated Sit to supine: Supervision   General bed mobility comments: MinA for HHA to elevate trunk then pt able to scoot self to EOB; return to supine  with supervision for line management    Transfers Overall transfer level: Needs assistance Equipment used: Rolling walker (2 wheels) Transfers: Sit to/from Stand Sit to Stand: Min guard           General transfer comment: Multiple sit<>stands from EOB to RW with min guard, cues for hand placement with improved stability    Ambulation/Gait Ambulation/Gait assistance: Min guard Gait Distance (Feet): 32 Feet (+ 34') Assistive device: Rolling walker (2 wheels) Gait Pattern/deviations: Step-through pattern;Trunk flexed;Decreased stride length Gait velocity: Decreased     General Gait Details: Slow, guarded gait with RW and intermittent min guard for balance, 1x seated rest break secondary to fatigue; pt declined hallway ambulation, opting to walk laps in room   Stairs             Wheelchair Mobility    Modified Rankin (Stroke Patients Only)       Balance Overall balance assessment: Needs assistance Sitting-balance support: No upper extremity supported;Feet supported Sitting balance-Leahy Scale: Fair     Standing balance support: Bilateral upper extremity supported;Reliant on assistive device for balance Standing balance-Leahy Scale: Poor Standing balance comment: reliant on at least one UE support                            Cognition Arousal/Alertness: Awake/alert Behavior During Therapy: WFL for tasks assessed/performed Overall Cognitive Status: Impaired/Different from baseline Area of Impairment: Attention;Memory;Following commands;Safety/judgement;Awareness;Problem solving                   Current Attention Level: Selective Memory: Decreased short-term memory Following Commands: Follows one step commands consistently;Follows multi-step commands inconsistently Safety/Judgement: Decreased awareness of safety Awareness: Emergent Problem Solving: Slow processing;Requires verbal cues  Exercises      General Comments General  comments (skin integrity, edema, etc.): Pt's sister Rosaria Ferries) present at end of session, reports pt's daughter was unable to make it this afternoon afterall      Pertinent Vitals/Pain Pain Assessment: Faces Faces Pain Scale: Hurts a little bit Pain Location: chest tube insertion site Pain Descriptors / Indicators: Grimacing;Guarding Pain Intervention(s): Monitored during session    Home Living                          Prior Function            PT Goals (current goals can now be found in the care plan section) Progress towards PT goals: Progressing toward goals    Frequency    Min 3X/week      PT Plan Current plan remains appropriate    Co-evaluation              AM-PAC PT "6 Clicks" Mobility   Outcome Measure  Help needed turning from your back to your side while in a flat bed without using bedrails?: A Little Help needed moving from lying on your back to sitting on the side of a flat bed without using bedrails?: A Little Help needed moving to and from a bed to a chair (including a wheelchair)?: A Little Help needed standing up from a chair using your arms (e.g., wheelchair or bedside chair)?: A Little Help needed to walk in hospital room?: A Little Help needed climbing 3-5 steps with a railing? : A Lot 6 Click Score: 17    End of Session   Activity Tolerance: Patient tolerated treatment well Patient left: in bed;with call bell/phone within reach;with bed alarm set Nurse Communication: Mobility status PT Visit Diagnosis: Other abnormalities of gait and mobility (R26.89);Muscle weakness (generalized) (M62.81)     Time: 7035-0093 PT Time Calculation (min) (ACUTE ONLY): 27 min  Charges:  $Therapeutic Exercise: 23-37 mins                     Mabeline Caras, PT, DPT Acute Rehabilitation Services  Pager [phone] Office [place] 04/03/2021, 5:25 PM

## 2021-04-03 NOTE — Progress Notes (Signed)
Patient ID: Nicole Rush, female   DOB: Nov 04, 1940, 80 y.o.   MRN: [id number]  PROGRESS NOTE    Nicole Rush  NWG:[id number] DOB: 02-Nov-1940 DOA: 03/21/2021 PCP: Lin Landsman, MD   Brief Narrative:  80 year old female with history of asthma, cancer, hypertension, CVA unspecified and recent flu infection presented with 1 to 2 weeks of chest pain with dyspnea, nausea and admitted with pericarditis with trivial pericardial effusion and 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  Cardiology was consulted.  TTE showed EF of 65 to 70% with moderate TVR and trivial pericardial effusion.  She was started on prednisone and colchicine for possible postviral pericarditis  She spiked a temperature on 03/26/2021.  Empirically started on Rocephin.  Infectious work-up unrevealing so far.  Afebrile since 03/29/2021 but persistent leukocytosis.  Patient with intermittent chest pain.  Repeat TTE on 04/01/2021 showed large pericardial effusion with possible tamponade.  She underwent VATS and pericardial window on 04/02/2021.  Assessment & Plan:   Left-sided chest pain due to pericarditis/large pericardial effusion with cardiac tamponade -Pericarditis was felt to be postviral from influenza A infection.  TTE as above. -Status post VATS and pericardial window by CVTS on 04/02/2021.  Chest tube and JP drain in place.  Follow further CVTS recommendations. -Continue colchicine.  Also treated with prednisone.  Follow cardiology recommendations regarding duration of treatment  Influenza A -Initially diagnosed on 03/17/2021.  Completed course of Tamiflu.  Fever -Possibly from pericarditis.  Infectious work-up negative so far with negative blood cultures.  No further fever since 03/29/2021. -Empirically started on IV Rocephin: Complete 5-day course of therapy  Acute respiratory failure with hypoxia -Possibly from influenza a. -Currently on room air. -PCCM recommended outpatient follow-up for calcified granuloma in the left  lung  Elevated troponin -Felt to be due to pericarditis.  Cardiology following  Lactic acidosis -Likely due to poor perfusion in the setting of tamponade and pericardial effusion. -Improved  Leukocytosis -Improving  Microcytic anemia -Anemia panel consistent with anemia of chronic disease.  Hemoglobin stable.  Essential hypertension -Blood pressure on the lower side.  Home losartan and hydrochlorothiazide on hold  Hyponatremia -Mild.  Monitor  Elevated LFTs -Questionable cause.  Improved.  Crestor on hold  Generalized weakness/physical deconditioning -Will need home health PT.  Patient refused SNF placement -Currently remains full code.  Palliative care following.  Severe malnutrition -Follow nutrition recommendations  DVT prophylaxis: Lovenox Code Status: Full Family Communication: None at bedside Disposition Plan: Status is: Inpatient  Remains inpatient appropriate because: Of severity of illness.  Currently has chest tube and JP drain for pericardial effusion   Consultants: Cardiology/cardiothoracic surgery/palliative care  Procedures:  VATS and pericardial window on 04/02/2021  Antimicrobials:  Anti-infectives (From admission, onward)    Start     Dose/Rate Route Frequency Ordered Stop   03/29/21 1400  cefTRIAXone (ROCEPHIN) 1 g in sodium chloride 0.9 % 100 mL IVPB        1 g 200 mL/hr over 30 Minutes Intravenous Every 24 hours 03/29/21 1221 04/03/21 1359   03/22/21 1000  oseltamivir (TAMIFLU) capsule 30 mg       Note to Pharmacy: Tamiflu 30 mg BID for CrCl <  60 mL/min   30 mg Oral 2 times daily 03/22/21 0304 03/23/21 2208        Subjective: Patient seen and examined at bedside.  Feels little better.  Denies any worsening chest pain, nausea, vomiting.  No overnight fever reported. Objective: Vitals:   04/02/21 2347 04/03/21 0400 04/03/21 0823 04/03/21 1121  BP: 98/63 90/68 (!) 101/58 (!) 91/58  Pulse: 83 83 86 85  Resp: 11 20 20 18   Temp: 97.9  F (36.6 C) 98 F (36.7 C) 97.9 F (36.6 C) 97.9 F (36.6 C)  TempSrc: Oral Oral    SpO2: 94% 95% 93% 94%  Weight:      Height:        Intake/Output Summary (Last 24 hours) at 04/03/2021 1227 Last data filed at 04/03/2021 0720 Gross per 24 hour  Intake 360 ml  Output 673 ml  Net -313 ml   Filed Weights   03/23/21 1800  Weight: 43.3 kg    Examination:  General exam: Appears calm and comfortable.  Looks chronically ill.  Elderly female lying in bed.  Currently on room air. Respiratory system: Bilateral decreased breath sounds at bases with some scattered crackles.  Chest tube and JP drain in place. Cardiovascular system: S1 & S2 heard, Rate controlled Gastrointestinal system: Abdomen is nondistended, soft and nontender. Normal bowel sounds heard. Extremities: No cyanosis, clubbing, edema  Central nervous system: Alert and oriented. No focal neurological deficits. Moving extremities Skin: No rashes, lesions or ulcers Psychiatry: Affect is mostly flat.   Data Reviewed: I have personally reviewed following labs and imaging studies  CBC: Recent Labs  Lab 03/28/21 0313 03/29/21 0426 03/30/21 0109 03/31/21 0053 04/01/21 0046 04/02/21 0037 04/03/21 0645  WBC 15.0*   < > 17.2* 14.7* 14.9* 21.3* 16.9*  NEUTROABS 12.6*  --   --   --   --  18.2*  --   HGB 10.1*   < > 9.6* 9.6* 9.9* 11.4* 9.1*  HCT 29.6*   < > 28.3* 27.9* 29.2* 34.1* 26.4*  MCV 74.4*   < > 74.9* 74.4* 74.9* 75.3* 73.5*  PLT 288   < > 343 344 402* 522* PLATELET CLUMPS NOTED ON SMEAR, UNABLE TO ESTIMATE   < > = values in this interval not displayed.   Basic Metabolic Panel: Recent Labs  Lab 03/28/21 0313 03/30/21 0040 03/30/21 0109 03/31/21 0053 04/02/21 0037 04/03/21 0645  NA 136 134*  --  135 133* 132*  K 4.3 4.7  --  4.2 4.4 3.7  CL 105 104  --  102 101 100  CO2 22 19*  --  21* 20* 23  GLUCOSE 87 116*  --  118* 118* 102*  BUN 21 30*  --  25* 23 20  CREATININE 0.70 0.67  --  0.64 0.81 0.68   CALCIUM 8.2* 8.3*  --  8.1* 8.5* 8.3*  MG 2.1  --  2.1 1.9 1.8  --   PHOS 2.1* 2.9  --  2.2* 3.3  --    GFR: Estimated Creatinine Clearance: 37.1 mL/min (by C-G formula based on SCr of 0.68 mg/dL). Liver Function Tests: Recent Labs  Lab 03/28/21 0313 03/29/21 0426 03/30/21 0040 03/31/21 0053 04/02/21 0037 04/03/21 0645  AST 96* 57*  --  50* 42* 25  ALT 62* 50*  --  46* 50* 31  ALKPHOS 78 94  --  84 90 72  BILITOT 0.9 0.9  --  0.5 0.9 0.6  PROT 6.0* 6.3*  --  6.3* 6.8 5.8*  ALBUMIN 1.8*   1.8* 1.9* 1.9* 1.8*   1.9* 2.2* 1.8*   No results for input(s): LIPASE, AMYLASE in the last 168 hours. No results for input(s): AMMONIA in the last 168 hours. Coagulation Profile: No results for input(s): INR, PROTIME in the last 168 hours. Cardiac Enzymes: Recent Labs  Lab  03/27/21 1430  CKTOTAL 92   BNP (last 3 results) No results for input(s): PROBNP in the last 8760 hours. HbA1C: No results for input(s): HGBA1C in the last 72 hours. CBG: No results for input(s): GLUCAP in the last 168 hours. Lipid Profile: No results for input(s): CHOL, HDL, LDLCALC, TRIG, CHOLHDL, LDLDIRECT in the last 72 hours. Thyroid Function Tests: No results for input(s): TSH, T4TOTAL, FREET4, T3FREE, THYROIDAB in the last 72 hours. Anemia Panel: No results for input(s): VITAMINB12, FOLATE, FERRITIN, TIBC, IRON, RETICCTPCT in the last 72 hours. Sepsis Labs: Recent Labs  Lab 04/01/21 1841 04/01/21 2207 04/02/21 0037 04/03/21 0618  LATICACIDVEN 4.9* 3.2* 3.6* 1.8    Recent Results (from the past 240 hour(s))  Urine Culture     Status: Abnormal   Collection Time: 03/26/21  8:17 AM   Specimen: Urine, Clean Catch  Result Value Ref Range Status   Specimen Description URINE, CLEAN CATCH  Final   Special Requests   Final    NONE Performed at [facility], [place] [address] [place], [place] [address]    Culture MULTIPLE SPECIES PRESENT, SUGGEST RECOLLECTION (A)  Final   Report Status 03/28/2021  FINAL  Final  Culture, blood (routine x 2)     Status: None   Collection Time: 03/26/21  9:27 AM   Specimen: BLOOD RIGHT HAND  Result Value Ref Range Status   Specimen Description BLOOD RIGHT HAND  Final   Special Requests   Final    AEROBIC BOTTLE ONLY Blood Culture results may not be optimal due to an inadequate volume of blood received in culture bottles   Culture   Final    NO GROWTH 5 DAYS Performed at [facility], [place] [address], [place], [place] [address]    Report Status 03/31/2021 FINAL  Final  Culture, blood (routine x 2)     Status: None   Collection Time: 03/26/21 10:33 AM   Specimen: BLOOD RIGHT HAND  Result Value Ref Range Status   Specimen Description BLOOD RIGHT HAND  Final   Special Requests   Final    AEROBIC BOTTLE ONLY Blood Culture results may not be optimal due to an inadequate volume of blood received in culture bottles   Culture   Final    NO GROWTH 5 DAYS Performed at [facility], [place] [address], [place],  [address]    Report Status 03/31/2021 FINAL  Final  Surgical pcr screen     Status: None   Collection Time: 04/02/21  5:57 AM   Specimen: Nasal Mucosa; Nasal Swab  Result Value Ref Range Status   MRSA, PCR NEGATIVE NEGATIVE Final   Staphylococcus aureus NEGATIVE NEGATIVE Final    Comment: (NOTE) The Xpert SA Assay (FDA approved for NASAL specimens in patients 62 years of age and older), is one component of a comprehensive surveillance program. It is not intended to diagnose infection nor to guide or monitor treatment. Performed at [facility], [place] [address], [place],  [address]   Aerobic/Anaerobic Culture w Gram Stain (surgical/deep wound)     Status: None (Preliminary result)   Collection Time: 04/02/21  7:14 AM   Specimen: PATH Cytology Misc. fluid; Body Fluid  Result Value Ref Range Status   Specimen Description FLUID  Final   Special Requests PERICARDIAL FLUID SPEC A  Final   Gram Stain   Final    FEW WBC  PRESENT,BOTH PMN AND MONONUCLEAR NO ORGANISMS SEEN  Culture   Final    NO GROWTH 1 DAY Performed at [facility], [place] [address], [place], [place] [address]    Report Status PENDING  Incomplete  Acid Fast Smear (AFB)     Status: None   Collection Time: 04/02/21  7:14 AM   Specimen: PATH Cytology Misc. fluid; Body Fluid  Result Value Ref Range Status   AFB Specimen Processing Concentration  Final   Acid Fast Smear Negative  Final    Comment: (NOTE) Performed At: [facility] [place], [place] [id number] Rush Farmer MD WP:[id number]    Source (AFB) PERICARDIAL FLUID SPEC A  Final    Comment: Performed at [facility], [place] [address] [place], [place] [address]         Radiology Studies: DG Chest Port 1 View  Result Date: 04/03/2021 CLINICAL DATA:  Chest tube.  Pericardial effusion. EXAM: PORTABLE CHEST 1 VIEW COMPARISON:  One-view chest x-ray 04/02/2021. FINDINGS: Marked is enlarged. Mediastinal drain and right-sided chest tube are stable. No pneumothorax is present. Small left pleural effusion and left basilar airspace disease remains. The lungs are otherwise clear. Surgical clips are present in the left axilla. IMPRESSION: 1. Stable cardiomegaly without failure. 2. Stable small left pleural effusion and left basilar airspace disease. 3. Stable right-sided chest tube without pneumothorax. Electronically Signed   By: San Morelle M.D.   On: 04/03/2021 09:28   DG Chest Port 1 View  Result Date: 04/02/2021 CLINICAL DATA:  Status post pericardial window with thoracotomy EXAM: PORTABLE CHEST 1 VIEW COMPARISON:  04/01/2021 FINDINGS: Unchanged mild cardiomegaly. Minimal left basilar atelectasis. Lungs otherwise clear. No pulmonary vascular congestion. Interval placement of right IJ central venous catheter which terminates at the cavoatrial junction. Mediastinal drain is seen. There is a right chest tube also in place. No pneumothorax.  IMPRESSION: Unchanged mild cardiomegaly and mild left basilar atelectasis. Electronically Signed   By: Miachel Roux M.D.   On: 04/02/2021 12:47   ECHOCARDIOGRAM LIMITED  Result Date: 04/01/2021    ECHOCARDIOGRAM LIMITED REPORT   Patient Name:   CATHE BILGER Date of Exam: 04/01/2021 Medical Rec #:  [id number]          Height:       61.0 in Accession #:    [id number]         Weight:       95.5 lb Date of Birth:  [birth year]           BSA:          1.379 m Patient Age:    37 years           BP:           118/82 mmHg Patient Gender: F                  HR:           106 bpm. Exam Location:  Inpatient Procedure: Limited Echo and Limited Color Doppler Indications:    Chest Pain  History:        Patient has prior history of Echocardiogram examinations, most                 recent 03/24/2021. Risk Factors:Dyslipidemia. Breast CA.  Sonographer:    Wenda Low Referring Phys: [id number] TAYE T GONFA IMPRESSIONS  1. There is a large pericardial effusion locately predominately over the anterior aspect of the RV (up to 1.6 cm). It measures posterior to the  LV (0.9 cm). This is new from the prior echo. There is subtle RV collapse in diastole. Poor PW tracing of MV inflow. The IVC is dilated ~2.1 cm with >50 collapse. Hepatic vein PW is suggestive of late diastolic reversal with inspiration. The findings could represent early tamponade physiology especially given the change in size of the effusion. Clinical correlation is recommended. Large pericardial effusion. The pericardial effusion is anterior to the right ventricle.  2. Left ventricular ejection fraction, by estimation, is 70 to 75%. The left ventricle has hyperdynamic function. The left ventricle has no regional wall motion abnormalities. There is mild concentric left ventricular hypertrophy.  3. Right ventricular systolic function is normal. The right ventricular size is normal.  4. The aortic valve is tricuspid. Comparison(s): Changes from prior study are noted.  Effusion is larger compared with prior. FINDINGS  Left Ventricle: Left ventricular ejection fraction, by estimation, is 70 to 75%. The left ventricle has hyperdynamic function. The left ventricle has no regional wall motion abnormalities. The left ventricular internal cavity size was small. There is mild concentric left ventricular hypertrophy. Right Ventricle: The right ventricular size is normal. No increase in right ventricular wall thickness. Right ventricular systolic function is normal. Left Atrium: Left atrial size was normal in size. Right Atrium: Right atrial size was normal in size. Pericardium: There is a large pericardial effusion locately predominately over the anterior aspect of the RV (up to 1.6 cm). It measures posterior to the LV (0.9 cm). This is new from the prior echo. There is subtle RV collapse in diastole. Poor PW tracing of MV inflow. The IVC is dilated ~2.1 cm with >50 collapse. Hepatic vein PW is suggestive of late diastolic reversal with inspiration. The findings could represent early tamponade physiology especially given the change in size of the effusion. Clinical correlation is recommended. A large pericardial effusion is present. The pericardial effusion is anterior to the right ventricle. Aortic Valve: The aortic valve is tricuspid. Pulmonic Valve: The pulmonic valve was grossly normal. Aorta: The aortic root is normal in size and structure. IAS/Shunts: The atrial septum is grossly normal. LEFT VENTRICLE PLAX 2D LVIDd:         2.60 cm LVIDs:         1.40 cm LV PW:         1.20 cm LV IVS:        1.20 cm LVOT diam:     1.90 cm LVOT Area:     2.84 cm  LEFT ATRIUM             Index LA diam:        2.50 cm 1.81 cm/m LA Vol (A2C):   55.3 ml 40.09 ml/m LA Vol (A4C):   23.3 ml 16.89 ml/m LA Biplane Vol: 38.5 ml 27.91 ml/m   AORTA Ao Root diam: 2.70 cm  SHUNTS Systemic Diam: 1.90 cm Eleonore Chiquito MD Electronically signed by Eleonore Chiquito MD Signature Date/Time: 04/01/2021/4:06:42 PM     Final         Scheduled Meds:  acetaminophen  1,000 mg Oral Q6H   Or   acetaminophen (TYLENOL) oral liquid 160 mg/5 mL  1,000 mg Oral Q6H   bisacodyl  10 mg Oral Daily   Chlorhexidine Gluconate Cloth  6 each Topical Daily   colchicine  0.6 mg Oral Daily   enoxaparin (LOVENOX) injection  30 mg Subcutaneous Daily   feeding supplement  237 mL Oral BID BM   ibuprofen  800 mg Oral  TID   loratadine  10 mg Oral Daily   melatonin  5 mg Oral QHS   pantoprazole  40 mg Oral Daily   polyethylene glycol  17 g Oral BID   senna-docusate  1 tablet Oral BID   Continuous Infusions:  cefTRIAXone (ROCEPHIN)  IV 200 mL/hr at 04/02/21 1719          Aline August, MD [facility] 04/03/2021, 12:27 PM '

## 2021-04-03 NOTE — Progress Notes (Signed)
Cardiology Progress Note  Patient ID: Nicole Rush MRN: [id number] DOB: 06-Sep-1940 Date of Encounter: 04/03/2021  Primary Cardiologist: None  Subjective   Chief Complaint: None.  HPI: Chest pain improved after pericardial window.  Blood pressure much improved as well.  She reports her chest pain is improving.  ROS:  All other ROS reviewed and negative. Pertinent positives noted in the HPI.     Inpatient Medications  Scheduled Meds:  acetaminophen  1,000 mg Oral Q6H   Or   acetaminophen (TYLENOL) oral liquid 160 mg/5 mL  1,000 mg Oral Q6H   bisacodyl  10 mg Oral Daily   Chlorhexidine Gluconate Cloth  6 each Topical Daily   colchicine  0.6 mg Oral Daily   enoxaparin (LOVENOX) injection  30 mg Subcutaneous Daily   feeding supplement  237 mL Oral BID BM   ibuprofen  800 mg Oral TID   loratadine  10 mg Oral Daily   melatonin  5 mg Oral QHS   pantoprazole  40 mg Oral Daily   polyethylene glycol  17 g Oral BID   senna-docusate  1 tablet Oral BID   Continuous Infusions:  cefTRIAXone (ROCEPHIN)  IV 200 mL/hr at 04/02/21 1719   PRN Meds: acetaminophen **OR** acetaminophen, fentaNYL (SUBLIMAZE) injection, guaiFENesin, hydrOXYzine, ipratropium, levalbuterol, ondansetron **OR** ondansetron (ZOFRAN) IV, ondansetron (ZOFRAN) IV, oxyCODONE   Vital Signs   Vitals:   04/02/21 1953 04/02/21 2347 04/03/21 0400 04/03/21 0823  BP: 108/71 98/63 90/68  (!) 101/58  Pulse: 84 83 83 86  Resp: 18 11 20 20   Temp: 98.5 F (36.9 C) 97.9 F (36.6 C) 98 F (36.7 C) 97.9 F (36.6 C)  TempSrc: Oral Oral Oral   SpO2: 93% 94% 95% 93%  Weight:      Height:        Intake/Output Summary (Last 24 hours) at 04/03/2021 0940 Last data filed at 04/03/2021 0720 Gross per 24 hour  Intake 360 ml  Output 818 ml  Net -458 ml   Last 3 Weights 03/23/2021 11/06/2020 11/07/2019  Weight (lbs) 95 lb 7.4 oz 104 lb 7 oz 100 lb 3.2 oz  Weight (kg) 43.3 kg 47.373 kg 45.45 kg      Telemetry  Overnight  telemetry shows sinus rhythm in the 80s, which I personally reviewed.   Physical Exam   Vitals:   04/02/21 1953 04/02/21 2347 04/03/21 0400 04/03/21 0823  BP: 108/71 98/63 90/68  (!) 101/58  Pulse: 84 83 83 86  Resp: 18 11 20 20   Temp: 98.5 F (36.9 C) 97.9 F (36.6 C) 98 F (36.7 C) 97.9 F (36.6 C)  TempSrc: Oral Oral Oral   SpO2: 93% 94% 95% 93%  Weight:      Height:        Intake/Output Summary (Last 24 hours) at 04/03/2021 0940 Last data filed at 04/03/2021 0720 Gross per 24 hour  Intake 360 ml  Output 818 ml  Net -458 ml    Last 3 Weights 03/23/2021 11/06/2020 11/07/2019  Weight (lbs) 95 lb 7.4 oz 104 lb 7 oz 100 lb 3.2 oz  Weight (kg) 43.3 kg 47.373 kg 45.45 kg    Body mass index is 18.04 kg/m.   General: Well nourished, well developed, in no acute distress Head: Atraumatic, normal size  Eyes: PEERLA, EOMI  Neck: Supple, no JVD Endocrine: No thryomegaly Cardiac: Normal S1, S2; RRR; no murmurs, pericardial rub present Lungs: Clear to auscultation bilaterally, no wheezing, rhonchi or rales  Abd: Soft, nontender, no hepatomegaly  Ext: No edema, pulses 2+ Musculoskeletal: No deformities, BUE and BLE strength normal and equal Skin: Warm and dry, no rashes   Neuro: Alert and oriented to person, place, time, and situation, CNII-XII grossly intact, no focal deficits  Psych: Normal mood and affect   Labs  High Sensitivity Troponin:   Recent Labs  Lab 03/21/21 0935 03/21/21 1135 03/21/21 2014 04/01/21 0909 04/01/21 1124  TROPONINIHS 9 7 9  20* 627*     Cardiac EnzymesNo results for input(s): TROPONINI in the last 168 hours. No results for input(s): TROPIPOC in the last 168 hours.  Chemistry Recent Labs  Lab 03/31/21 0053 04/02/21 0037 04/03/21 0645  NA 135 133* 132*  K 4.2 4.4 3.7  CL 102 101 100  CO2 21* 20* 23  GLUCOSE 118* 118* 102*  BUN 25* 23 20  CREATININE 0.64 0.81 0.68  CALCIUM 8.1* 8.5* 8.3*  PROT 6.3* 6.8 5.8*  ALBUMIN 1.8*   1.9* 2.2* 1.8*   AST 50* 42* 25  ALT 46* 50* 31  ALKPHOS 84 90 72  BILITOT 0.5 0.9 0.6  GFRNONAA >60 >60 >60  ANIONGAP 12 12 9     Hematology Recent Labs  Lab 04/01/21 0046 04/02/21 0037 04/03/21 0645  WBC 14.9* 21.3* 16.9*  RBC 3.90 4.53 3.59*  HGB 9.9* 11.4* 9.1*  HCT 29.2* 34.1* 26.4*  MCV 74.9* 75.3* 73.5*  MCH 25.4* 25.2* 25.3*  MCHC 33.9 33.4 34.5  RDW 15.0 14.8 15.3  PLT 402* 522* PLATELET CLUMPS NOTED ON SMEAR, UNABLE TO ESTIMATE   BNP Recent Labs  Lab 04/01/21 0909  BNP 101.0*    DDimer No results for input(s): DDIMER in the last 168 hours.   Radiology  DG Chest Port 1 View  Result Date: 04/03/2021 CLINICAL DATA:  Chest tube.  Pericardial effusion. EXAM: PORTABLE CHEST 1 VIEW COMPARISON:  One-view chest x-ray 04/02/2021. FINDINGS: Marked is enlarged. Mediastinal drain and right-sided chest tube are stable. No pneumothorax is present. Small left pleural effusion and left basilar airspace disease remains. The lungs are otherwise clear. Surgical clips are present in the left axilla. IMPRESSION: 1. Stable cardiomegaly without failure. 2. Stable small left pleural effusion and left basilar airspace disease. 3. Stable right-sided chest tube without pneumothorax. Electronically Signed   By: San Morelle M.D.   On: 04/03/2021 09:28   DG Chest Port 1 View  Result Date: 04/02/2021 CLINICAL DATA:  Status post pericardial window with thoracotomy EXAM: PORTABLE CHEST 1 VIEW COMPARISON:  04/01/2021 FINDINGS: Unchanged mild cardiomegaly. Minimal left basilar atelectasis. Lungs otherwise clear. No pulmonary vascular congestion. Interval placement of right IJ central venous catheter which terminates at the cavoatrial junction. Mediastinal drain is seen. There is a right chest tube also in place. No pneumothorax. IMPRESSION: Unchanged mild cardiomegaly and mild left basilar atelectasis. Electronically Signed   By: Miachel Roux M.D.   On: 04/02/2021 12:47   ECHOCARDIOGRAM LIMITED  Result  Date: 04/01/2021    ECHOCARDIOGRAM LIMITED REPORT   Patient Name:   Nicole Rush Date of Exam: 04/01/2021 Medical Rec #:  [id number]          Height:       61.0 in Accession #:    [id number]         Weight:       95.5 lb Date of Birth:  1940/04/29           BSA:          1.379 m Patient Age:    80 years  BP:           118/82 mmHg Patient Gender: F                  HR:           106 bpm. Exam Location:  Inpatient Procedure: Limited Echo and Limited Color Doppler Indications:    Chest Pain  History:        Patient has prior history of Echocardiogram examinations, most                 recent 03/24/2021. Risk Factors:Dyslipidemia. Breast CA.  Sonographer:    Wenda Low Referring Phys: [id number] TAYE T GONFA IMPRESSIONS  1. There is a large pericardial effusion locately predominately over the anterior aspect of the RV (up to 1.6 cm). It measures posterior to the LV (0.9 cm). This is new from the prior echo. There is subtle RV collapse in diastole. Poor PW tracing of MV inflow. The IVC is dilated ~2.1 cm with >50 collapse. Hepatic vein PW is suggestive of late diastolic reversal with inspiration. The findings could represent early tamponade physiology especially given the change in size of the effusion. Clinical correlation is recommended. Large pericardial effusion. The pericardial effusion is anterior to the right ventricle.  2. Left ventricular ejection fraction, by estimation, is 70 to 75%. The left ventricle has hyperdynamic function. The left ventricle has no regional wall motion abnormalities. There is mild concentric left ventricular hypertrophy.  3. Right ventricular systolic function is normal. The right ventricular size is normal.  4. The aortic valve is tricuspid. Comparison(s): Changes from prior study are noted. Effusion is larger compared with prior. FINDINGS  Left Ventricle: Left ventricular ejection fraction, by estimation, is 70 to 75%. The left ventricle has hyperdynamic function. The  left ventricle has no regional wall motion abnormalities. The left ventricular internal cavity size was small. There is mild concentric left ventricular hypertrophy. Right Ventricle: The right ventricular size is normal. No increase in right ventricular wall thickness. Right ventricular systolic function is normal. Left Atrium: Left atrial size was normal in size. Right Atrium: Right atrial size was normal in size. Pericardium: There is a large pericardial effusion locately predominately over the anterior aspect of the RV (up to 1.6 cm). It measures posterior to the LV (0.9 cm). This is new from the prior echo. There is subtle RV collapse in diastole. Poor PW tracing of MV inflow. The IVC is dilated ~2.1 cm with >50 collapse. Hepatic vein PW is suggestive of late diastolic reversal with inspiration. The findings could represent early tamponade physiology especially given the change in size of the effusion. Clinical correlation is recommended. A large pericardial effusion is present. The pericardial effusion is anterior to the right ventricle. Aortic Valve: The aortic valve is tricuspid. Pulmonic Valve: The pulmonic valve was grossly normal. Aorta: The aortic root is normal in size and structure. IAS/Shunts: The atrial septum is grossly normal. LEFT VENTRICLE PLAX 2D LVIDd:         2.60 cm LVIDs:         1.40 cm LV PW:         1.20 cm LV IVS:        1.20 cm LVOT diam:     1.90 cm LVOT Area:     2.84 cm  LEFT ATRIUM             Index LA diam:        2.50 cm 1.81 cm/m LA Vol (  A2C):   55.3 ml 40.09 ml/m LA Vol (A4C):   23.3 ml 16.89 ml/m LA Biplane Vol: 38.5 ml 27.91 ml/m   AORTA Ao Root diam: 2.70 cm  SHUNTS Systemic Diam: 1.90 cm Eleonore Chiquito MD Electronically signed by Eleonore Chiquito MD Signature Date/Time: 04/01/2021/4:06:42 PM    Final     Cardiac Studies  TTE 04/01/2021 1. There is a large pericardial effusion locately predominately over the  anterior aspect of the RV (up to 1.6 cm). It measures  posterior to the LV  (0.9 cm). This is new from the prior echo. There is subtle RV collapse in  diastole. Poor PW tracing of MV  inflow. The IVC is dilated ~2.1 cm with >50 collapse. Hepatic vein PW is  suggestive of late diastolic reversal with inspiration. The findings could  represent early tamponade physiology especially given the change in size  of the effusion. Clinical  correlation is recommended. Large pericardial effusion. The pericardial  effusion is anterior to the right ventricle.   2. Left ventricular ejection fraction, by estimation, is 70 to 75%. The  left ventricle has hyperdynamic function. The left ventricle has no  regional wall motion abnormalities. There is mild concentric left  ventricular hypertrophy.   3. Right ventricular systolic function is normal. The right ventricular  size is normal.   4. The aortic valve is tricuspid.   Patient Profile  Nicole Rush is a 80 y.o. female with asthma, cancer, hypertension, prior stroke, recent flu infection who was admitted on 03/22/2021 with fever and pericarditis.  Cardiology was reconsulted on 04/01/2021 for persistent chest pain and elevated troponin.  Assessment & Plan   #Acute pericarditis complicated by large pericardial effusion with cardiac tamponade #Influenza #Elevated troponin, demand secondary to tamponade -Admitted with chest pain symptoms consistent with pericarditis.  Symptoms were not improving and worsened.  Found to be in cardiac tamponade on 04/01/2021. -Status post pericardial window on 04/02/2021.  Gram stain negative.  AFB and cultures are pending. -Overall I believe her pericarditis was just worsened and she developed tamponade.  Doing well.  Would follow-up culture data. -I have ordered a TSH to be complete. -Limited echo today to document resolution of effusion. -For pericarditis with stop prednisone.  Have added ibuprofen 800 mg 3 times daily.  We will likely pursue this for 10 days and then  reduce her dose to 800 mg twice a day for 10 days and then 800 mg daily for 10 days.  Prefer an extended taper given severity of symptoms.  Would not do more than this is this is her first episode of pericarditis. -Continue colchicine 0.6 mg twice daily. -I will trend her inflammatory markers as an outpatient. -I have added Protonix for stomach protection while on high-dose ibuprofen. -Overall seems to be recovering well.  We will continue to follow her while she is here. -Everything is reassuring.  Her lactic acid is normalized.  She clearly was in cardiac tamponade.  For questions or updates, please contact [facility] Please consult [email or web address] for contact info under   Time Spent with Patient: I have spent a total of 35 minutes with patient reviewing hospital notes, telemetry, EKGs, labs and examining the patient as well as establishing an assessment and plan that was discussed with the patient.  > 50% of time was spent in direct patient care.    Signed, Addison Naegeli. Audie Box, MD, [place]  04/03/2021 9:40 AM

## 2021-04-04 ENCOUNTER — Inpatient Hospital Stay (HOSPITAL_COMMUNITY): Payer: Medicare HMO

## 2021-04-04 LAB — CBC
HCT: 25.3 % — ABNORMAL LOW (ref 36.0–46.0)
Hemoglobin: 8.4 g/dL — ABNORMAL LOW (ref 12.0–15.0)
MCH: 25.3 pg — ABNORMAL LOW (ref 26.0–34.0)
MCHC: 33.2 g/dL (ref 30.0–36.0)
MCV: 76.2 fL — ABNORMAL LOW (ref 80.0–100.0)
Platelets: 399 10*3/uL (ref 150–400)
RBC: 3.32 MIL/uL — ABNORMAL LOW (ref 3.87–5.11)
RDW: 15.3 % (ref 11.5–15.5)
WBC: 13.7 10*3/uL — ABNORMAL HIGH (ref 4.0–10.5)
nRBC: 0 % (ref 0.0–0.2)

## 2021-04-04 LAB — COMPREHENSIVE METABOLIC PANEL
ALT: 26 U/L (ref 0–44)
AST: 21 U/L (ref 15–41)
Albumin: 1.7 g/dL — ABNORMAL LOW (ref 3.5–5.0)
Alkaline Phosphatase: 85 U/L (ref 38–126)
Anion gap: 7 (ref 5–15)
BUN: 28 mg/dL — ABNORMAL HIGH (ref 8–23)
CO2: 21 mmol/L — ABNORMAL LOW (ref 22–32)
Calcium: 8.2 mg/dL — ABNORMAL LOW (ref 8.9–10.3)
Chloride: 107 mmol/L (ref 98–111)
Creatinine, Ser: 0.73 mg/dL (ref 0.44–1.00)
GFR, Estimated: >60 mL/min (ref >60)
Glucose, Bld: 93 mg/dL (ref 70–99)
Potassium: 3.9 mmol/L (ref 3.5–5.1)
Sodium: 135 mmol/L (ref 135–145)
Total Bilirubin: 0.5 mg/dL (ref 0.3–1.2)
Total Protein: 5.9 g/dL — ABNORMAL LOW (ref 6.5–8.1)

## 2021-04-04 LAB — TSH: TSH: 4.732 u[IU]/mL — ABNORMAL HIGH (ref 0.350–4.500)

## 2021-04-04 LAB — MAGNESIUM: Magnesium: 1.8 mg/dL (ref 1.7–2.4)

## 2021-04-04 MED ORDER — ADULT MULTIVITAMIN W/MINERALS CH
1.0000 | ORAL_TABLET | Freq: Every day | ORAL | Status: DC
  Administered 2021-04-04 – 2021-04-06 (×3): 1 via ORAL
  Filled 2021-04-04 (×3): qty 1

## 2021-04-04 NOTE — Progress Notes (Addendum)
° °   °  [address]       [place] [address]             [phone]      2 Days Post-Op Procedure(s) (LRB): PERICARDIAL WINDOW WITH THORACOTOMY APPROACH (N/A) Subjective: Sitting up eating breakfast, says she feels good. Denies pain.   Objective: Vital signs in last 24 hours: Temp:  [97.9 F (36.6 C)-98.5 F (36.9 C)] 98.3 F (36.8 C) (12/15 0303) Pulse Rate:  [85-89] 86 (12/15 0303) Cardiac Rhythm: Normal sinus rhythm (12/15 0749) Resp:  [18-20] 19 (12/15 0303) BP: (91-131)/(58-81) 131/80 (12/15 0303) SpO2:  [93 %-96 %] 93 % (12/15 0303) Weight:  [40.6 kg] 40.6 kg (12/15 0500)    Intake/Output from previous day: 12/14 0701 - 12/15 0700 In: 260 [P.O.:260] Out: 96 [Drains:30; Chest Tube:66] Intake/Output this shift: No intake/output data recorded.  General appearance: alert, cooperative, and no distress Neurologic: intact Heart: regular rate and rhythm and Chest tube drained 50ml past 24 hours. JP dained 10ml past 24 hours.  Wound: Chest tube and JP secure, exit sites are dry.   Lab Results: Recent Labs    04/03/21 0645 04/04/21 0411  WBC 16.9* 13.7*  HGB 9.1* 8.4*  HCT 26.4* 25.3*  PLT PLATELET CLUMPS NOTED ON SMEAR, UNABLE TO ESTIMATE 399    BMET:  Recent Labs    04/03/21 0645 04/04/21 0411  NA 132* 135  K 3.7 3.9  CL 100 107  CO2 23 21*  GLUCOSE 102* 93  BUN 20 28*  CREATININE 0.68 0.73  CALCIUM 8.3* 8.2*     PT/INR: No results for input(s): LABPROT, INR in the last 72 hours. ABG No results found for: PHART, HCO3, TCO2, ACIDBASEDEF, O2SAT CBG (last 3)  No results for input(s): GLUCAP in the last 72 hours.  Assessment/Plan: S/P Procedure(s) (LRB): PERICARDIAL WINDOW WITH THORACOTOMY APPROACH (N/A) -POD2 right VATS drainage of pericardial effusion related to pericarditis.  I  Leave the right pleural drain tube in place for now, remove the JP pericardial drain. Mobilize.   -Anemia- Hct drifting down but she seems to be  tolerating well- will defer decision regarding transfusion to primary team.    LOS: 14 days    Antony Odea, PA-C [phone] 04/04/2021   Agree with above Doing well, feels better Will remove pericardial drain today  Tulip Meharg O Mohanad Carsten

## 2021-04-04 NOTE — Progress Notes (Signed)
Cardiology Progress Note  Patient ID: Nicole Rush MRN: [id number] DOB: [birth year] Date of Encounter: 04/04/2021  Primary Cardiologist: None  Subjective   Chief Complaint: None.   HPI: Denies further chest pain episodes.  Breathing comfortably.  Resting well.  ROS:  All other ROS reviewed and negative. Pertinent positives noted in the HPI.     Inpatient Medications  Scheduled Meds:  acetaminophen  1,000 mg Oral Q6H   Or   acetaminophen (TYLENOL) oral liquid 160 mg/5 mL  1,000 mg Oral Q6H   bisacodyl  10 mg Oral Daily   Chlorhexidine Gluconate Cloth  6 each Topical Daily   colchicine  0.6 mg Oral Daily   enoxaparin (LOVENOX) injection  30 mg Subcutaneous Daily   feeding supplement  237 mL Oral BID BM   ibuprofen  800 mg Oral TID   loratadine  10 mg Oral Daily   melatonin  5 mg Oral QHS   pantoprazole  40 mg Oral Daily   polyethylene glycol  17 g Oral BID   senna-docusate  1 tablet Oral BID   Continuous Infusions:  PRN Meds: acetaminophen **OR** acetaminophen, fentaNYL (SUBLIMAZE) injection, guaiFENesin, hydrOXYzine, ipratropium, levalbuterol, ondansetron **OR** ondansetron (ZOFRAN) IV, ondansetron (ZOFRAN) IV, oxyCODONE   Vital Signs   Vitals:   04/04/21 0028 04/04/21 0303 04/04/21 0500 04/04/21 0816  BP: 111/81 131/80  112/65  Pulse: 86 86  85  Resp: 18 19  20   Temp: 98 F (36.7 C) 98.3 F (36.8 C)  98.6 F (37 C)  TempSrc: Oral Oral  Oral  SpO2: 95% 93%  97%  Weight:   40.6 kg   Height:        Intake/Output Summary (Last 24 hours) at 04/04/2021 0957 Last data filed at 04/04/2021 0837 Gross per 24 hour  Intake 260 ml  Output 180 ml  Net 80 ml   Last 3 Weights 04/04/2021 03/23/2021 11/06/2020  Weight (lbs) 89 lb 8.1 oz 95 lb 7.4 oz 104 lb 7 oz  Weight (kg) 40.6 kg 43.3 kg 47.373 kg      Telemetry  Overnight telemetry shows sinus rhythm in the 80s, which I personally reviewed.   Physical Exam   Vitals:   04/04/21 0028 04/04/21 0303 04/04/21  0500 04/04/21 0816  BP: 111/81 131/80  112/65  Pulse: 86 86  85  Resp: 18 19  20   Temp: 98 F (36.7 C) 98.3 F (36.8 C)  98.6 F (37 C)  TempSrc: Oral Oral  Oral  SpO2: 95% 93%  97%  Weight:   40.6 kg   Height:        Intake/Output Summary (Last 24 hours) at 04/04/2021 0957 Last data filed at 04/04/2021 0837 Gross per 24 hour  Intake 260 ml  Output 180 ml  Net 80 ml    Last 3 Weights 04/04/2021 03/23/2021 11/06/2020  Weight (lbs) 89 lb 8.1 oz 95 lb 7.4 oz 104 lb 7 oz  Weight (kg) 40.6 kg 43.3 kg 47.373 kg    Body mass index is 16.91 kg/m.  General: Well nourished, well developed, in no acute distress Head: Atraumatic, normal size  Eyes: PEERLA, EOMI  Neck: Supple, no JVD Endocrine: No thryomegaly Cardiac: Normal S1, S2; RRR; no murmurs, rubs, or gallops Lungs: Clear to auscultation bilaterally, no wheezing, rhonchi or rales  Abd: Soft, nontender, no hepatomegaly  Ext: No edema, pulses 2+ Musculoskeletal: No deformities, BUE and BLE strength normal and equal Skin: Warm and dry, no rashes   Neuro: Alert and  oriented to person, place, time, and situation, CNII-XII grossly intact, no focal deficits  Psych: Normal mood and affect   Labs  High Sensitivity Troponin:   Recent Labs  Lab 03/21/21 0935 03/21/21 1135 03/21/21 2014 04/01/21 0909 04/01/21 1124  TROPONINIHS 9 7 9  20* 627*     Cardiac EnzymesNo results for input(s): TROPONINI in the last 168 hours. No results for input(s): TROPIPOC in the last 168 hours.  Chemistry Recent Labs  Lab 04/02/21 0037 04/03/21 0645 04/04/21 0411  NA 133* 132* 135  K 4.4 3.7 3.9  CL 101 100 107  CO2 20* 23 21*  GLUCOSE 118* 102* 93  BUN 23 20 28*  CREATININE 0.81 0.68 0.73  CALCIUM 8.5* 8.3* 8.2*  PROT 6.8 5.8* 5.9*  ALBUMIN 2.2* 1.8* 1.7*  AST 42* 25 21  ALT 50* 31 26  ALKPHOS 90 72 85  BILITOT 0.9 0.6 0.5  GFRNONAA >60 >60 >60  ANIONGAP 12 9 7     Hematology Recent Labs  Lab 04/02/21 0037 04/03/21 0645  04/04/21 0411  WBC 21.3* 16.9* 13.7*  RBC 4.53 3.59* 3.32*  HGB 11.4* 9.1* 8.4*  HCT 34.1* 26.4* 25.3*  MCV 75.3* 73.5* 76.2*  MCH 25.2* 25.3* 25.3*  MCHC 33.4 34.5 33.2  RDW 14.8 15.3 15.3  PLT 522* PLATELET CLUMPS NOTED ON SMEAR, UNABLE TO ESTIMATE 399   BNP Recent Labs  Lab 04/01/21 0909  BNP 101.0*    DDimer No results for input(s): DDIMER in the last 168 hours.   Radiology  DG CHEST PORT 1 VIEW  Result Date: 04/04/2021 CLINICAL DATA:  Chest tube present, pericardial effusion EXAM: PORTABLE CHEST 1 VIEW COMPARISON:  04/03/2021 FINDINGS: Unchanged AP portable examination. Cardiomegaly. Right neck vascular catheter. Right-sided chest and mediastinal drainage tubes. No pneumothorax. Trace bilateral pleural effusions and mild, diffuse interstitial pulmonary opacity. No new or focal airspace opacity. IMPRESSION: 1.  Unchanged AP portable examination. Cardiomegaly. 2. Right-sided chest and mediastinal drainage tubes. No pneumothorax. 3. Trace bilateral pleural effusions and mild, diffuse interstitial pulmonary opacity, consistent with edema. No new or focal airspace opacity. Electronically Signed   By: Delanna Ahmadi M.D.   On: 04/04/2021 09:02   DG Chest Port 1 View  Result Date: 04/03/2021 CLINICAL DATA:  Chest tube.  Pericardial effusion. EXAM: PORTABLE CHEST 1 VIEW COMPARISON:  One-view chest x-ray 04/02/2021. FINDINGS: Marked is enlarged. Mediastinal drain and right-sided chest tube are stable. No pneumothorax is present. Small left pleural effusion and left basilar airspace disease remains. The lungs are otherwise clear. Surgical clips are present in the left axilla. IMPRESSION: 1. Stable cardiomegaly without failure. 2. Stable small left pleural effusion and left basilar airspace disease. 3. Stable right-sided chest tube without pneumothorax. Electronically Signed   By: San Morelle M.D.   On: 04/03/2021 09:28   DG Chest Port 1 View  Result Date: 04/02/2021 CLINICAL  DATA:  Status post pericardial window with thoracotomy EXAM: PORTABLE CHEST 1 VIEW COMPARISON:  04/01/2021 FINDINGS: Unchanged mild cardiomegaly. Minimal left basilar atelectasis. Lungs otherwise clear. No pulmonary vascular congestion. Interval placement of right IJ central venous catheter which terminates at the cavoatrial junction. Mediastinal drain is seen. There is a right chest tube also in place. No pneumothorax. IMPRESSION: Unchanged mild cardiomegaly and mild left basilar atelectasis. Electronically Signed   By: Miachel Roux M.D.   On: 04/02/2021 12:47   ECHOCARDIOGRAM LIMITED  Result Date: 04/03/2021    ECHOCARDIOGRAM LIMITED REPORT   Patient Name:   Nicole Rush Date  of Exam: 04/03/2021 Medical Rec #:  [id number]          Height:       61.0 in Accession #:    [id number]         Weight:       95.5 lb Date of Birth:  July 06, 1940           BSA:          1.379 m Patient Age:    55 years           BP:           91/58 mmHg Patient Gender: F                  HR:           92 bpm. Exam Location:  Inpatient Procedure: Cardiac Doppler, Color Doppler and Limited Echo Indications:    f/u pericardial effusion  History:        Patient has prior history of Echocardiogram examinations, most                 recent 04/01/2021. Pericarditis, Signs/Symptoms:Chest Pain; Risk                 Factors:Hypertension.  Sonographer:    Glo Herring Referring Phys: [id number] [place]  1. There is a small pericaridal effusion, greatest anterior to the RV. Respirophasic variation of the mitral inflow is < 30%. IVC is not dilated. There is not evidence of RV or RA collapse.  2. Left ventricular ejection fraction, by estimation, is 70 to 75%. The left ventricle has hyperdynamic function. The left ventricle has no regional wall motion abnormalities.  3. Right ventricular systolic function is normal. The right ventricular size is normal.  4. A small pericardial effusion is present. The pericardial effusion  is circumferential.  5. The mitral valve is normal in structure.  6. The aortic valve is tricuspid.  7. The inferior vena cava is normal in size with greater than 50% respiratory variability, suggesting right atrial pressure of 3 mmHg. Comparison(s): A prior study was performed on 04/01/21. Prior images reviewed side by side. Pericardial effusion has decreased from 04/01/21. There is no evidence of increased pericardial pressure in this study. FINDINGS  Left Ventricle: Left ventricular ejection fraction, by estimation, is 70 to 75%. The left ventricle has hyperdynamic function. The left ventricle has no regional wall motion abnormalities. Right Ventricle: The right ventricular size is normal. Right ventricular systolic function is normal. Pericardium: A small pericardial effusion is present. The pericardial effusion is circumferential. Mitral Valve: The mitral valve is normal in structure. Tricuspid Valve: The tricuspid valve is normal in structure. Tricuspid valve regurgitation is mild. Aortic Valve: The aortic valve is tricuspid. Venous: The inferior vena cava is normal in size with greater than 50% respiratory variability, suggesting right atrial pressure of 3 mmHg. IVC IVC diam: 1.60 cm Rudean Haskell MD Electronically signed by Rudean Haskell MD Signature Date/Time: 04/03/2021/5:13:06 PM    Final     Cardiac Studies  TTE 04/03/2021   1. There is a small pericaridal effusion, greatest anterior to the RV.  Respirophasic variation of the mitral inflow is < 30%. IVC is not dilated.  There is not evidence of RV or RA collapse.   2. Left ventricular ejection fraction, by estimation, is 70 to 75%. The  left ventricle has hyperdynamic function. The left ventricle has no  regional wall motion abnormalities.   3. Right ventricular systolic  function is normal. The right ventricular  size is normal.   4. A small pericardial effusion is present. The pericardial effusion is  circumferential.   5.  The mitral valve is normal in structure.   6. The aortic valve is tricuspid.   7. The inferior vena cava is normal in size with greater than 50%  respiratory variability, suggesting right atrial pressure of 3 mmHg.   Patient Profile  Estoria Geary is a 80 y.o. female with asthma, cancer, hypertension, prior stroke, recent flu infection who was admitted on 03/22/2021 with fever and pericarditis.  Cardiology was reconsulted on 04/01/2021 for persistent chest pain and elevated troponin.  Assessment & Plan   #Acute pericarditis complicated by large pericardial effusion with cardiac tamponade #Influenza #Elevated troponin, demand ischemia secondary to tamponade versus myopericarditis -Initially admitted with acute pericarditis.  Symptoms worsened and found to be in cardiac tamponade on 04/01/2021. -Also with elevated troponin.  Suspect an element of myopericarditis. -Status post pericardial window.  Echo yesterday shows trivial effusion on my review.  IVC is collapsing.  Overall condition has drastically improved. -TSH slightly elevated.  Will defer to hospital medicine.  Suspect this is likely normal.  I have ordered a free T4 and T3. -Surgical biopsy data consistent with acute fibrinous pericarditis.  Fungal stain negative.  AFB stain negative.  Cultures negative. -Suspect this was all acute pericarditis secondary to influenza. -Would recommend to continue 800 mg ibuprofen 3 times daily for 10 days followed by 800 mg twice daily for 10 days followed by 100 mg daily for 10 days.  Prefer an extended taper given that her symptoms did not improve with initial management. -Continue colchicine 0.6 mg twice daily for 3 months. -I have added Protonix 40 mg daily for stomach protection. -Overall her condition is drastically improved.  Her chest pain has resolved.  Hopefully her chest tube can come out soon. -I will plan to trend her inflammatory markers as an outpatient.  #Cardiac tamponade #Lactic  acidosis #Elevated liver enzymes -Suspect she was in tamponade.  Lactic acid was elevated.  Liver enzymes were elevated due to hepatic congestion.  This all has improved with pericardial window.  #Anemia -Close monitoring while on high-dose ibuprofen.  Suspect an element of blood loss related to surgery.  No signs of GI bleeding.  [facility] will sign off.   Medication Recommendations: Extended ibuprofen taper as above.  Colchicine 0.6 mg twice daily for 3 months.  Protonix 40 mg daily while on ibuprofen. Other recommendations (labs, testing, etc):  None.  Follow up as an outpatient: I will arrange hospital follow-up with me in 4 to 6 weeks.  For questions or updates, please contact [place] Please consult [email or web address] for contact info under   Time Spent with Patient: I have spent a total of 25 minutes with patient reviewing hospital notes, telemetry, EKGs, labs and examining the patient as well as establishing an assessment and plan that was discussed with the patient.  > 50% of time was spent in direct patient care.    Signed, Addison Naegeli. Audie Box, MD, [place]  04/04/2021 9:57 AM

## 2021-04-04 NOTE — Progress Notes (Addendum)
Patient ID: Nicole Rush, female   DOB: [birth year], 80 y.o.   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  Cardiology was consulted.  TTE showed EF of 65 to 70% with moderate TVR and trivial pericardial effusion.  She was started on prednisone and colchicine for possible postviral pericarditis  She spiked a temperature on 03/26/2021.  Empirically started on Rocephin.  Infectious work-up unrevealing so far.  Afebrile since 03/29/2021 but persistent leukocytosis.  Patient with intermittent chest pain.  Repeat TTE on 04/01/2021 showed large pericardial effusion with possible tamponade.  She underwent VATS and pericardial window on 04/02/2021.  Assessment & Plan:   Left-sided chest pain due to pericarditis/large pericardial effusion with cardiac tamponade -Pericarditis was felt to be postviral from influenza A infection.  TTE as above. -Status post VATS and pericardial window by CVTS on 04/02/2021.  Chest tube and JP drain in place.  Follow further CVTS recommendations. -Continue colchicine.  Prednisone discontinued by cardiology on 04/03/2021 and ibuprofen was added.   Influenza A -Initially diagnosed on 03/17/2021.  Completed course of Tamiflu.  Fever -Possibly from pericarditis.  Infectious work-up negative so far with negative blood cultures.  No further fever since 03/29/2021. -Empirically started on IV Rocephin: Completed 5-day course of therapy  Acute respiratory failure with hypoxia -Possibly from influenza. -Currently on room air. -PCCM recommended outpatient follow-up for calcified granuloma in the left lung  Elevated troponin -Felt  to be due to pericarditis.  Cardiology following  Lactic acidosis -Likely due to poor perfusion in the setting of tamponade and pericardial effusion. -Improved  Leukocytosis -Improving  Microcytic anemia -Anemia panel consistent with anemia of chronic disease.  Hemoglobin stable.  Essential hypertension -Blood pressure on the lower side.  Home losartan and hydrochlorothiazide on hold  Hyponatremia -Mild.  Monitor  Elevated LFTs -Questionable cause.  Improved.  Crestor on hold  Generalized weakness/physical deconditioning -Will need home health PT.  Patient refused SNF placement -Currently remains full code.  Palliative care following.  Severe malnutrition -Follow nutrition recommendations  DVT prophylaxis: Lovenox Code Status: Full Family Communication: None at bedside Disposition Plan: Status is: Inpatient  Remains inpatient appropriate because: Of severity of illness.  Currently has chest tube and JP drain for pericardial effusion   Consultants: Cardiology/cardiothoracic surgery/palliative care  Procedures:  VATS and pericardial window on 04/02/2021  Antimicrobials:  Anti-infectives (From admission, onward)    Start     Dose/Rate Route Frequency Ordered Stop   03/29/21 1400  cefTRIAXone (ROCEPHIN) 1 g in sodium chloride 0.9 % 100 mL IVPB  Status:  Discontinued        1 g 200 mL/hr over 30 Minutes Intravenous Every 24 hours 03/29/21 1221 04/03/21 1241   03/22/21 1000  oseltamivir (TAMIFLU) capsule 30 mg       Note to Pharmacy: Tamiflu 30 mg BID for CrCl <  60 mL/min   30 mg Oral 2 times daily 03/22/21 0304 03/23/21 2208        Subjective: Patient seen and examined at bedside.  No overnight fever, chest pain, vomiting reported. Objective: Vitals:   04/03/21 1928 04/04/21 0028 04/04/21 0303 04/04/21 0500  BP: 103/69 111/81 131/80   Pulse:  89 86 86   Resp: 20 18 19    Temp: 98.5 F (36.9 C) 98 F (36.7 C) 98.3 F (36.8 C)   TempSrc: Oral Oral Oral    SpO2: 96% 95% 93%   Weight:    40.6 kg  Height:        Intake/Output Summary (Last 24 hours) at 04/04/2021 0710 Last data filed at 04/04/2021 0322 Gross per 24 hour  Intake 260 ml  Output 96 ml  Net 164 ml    Filed Weights   03/23/21 1800 04/04/21 0500  Weight: 43.3 kg 40.6 kg    Examination:  General exam: On room air currently.  No distress.  Looks chronically ill.  Elderly female lying in bed.   Respiratory system: Decreased breath sounds at bases with scattered crackles and chest tube and JP drain in place. Cardiovascular system: Rate controlled; S1-S2 heard Gastrointestinal system: Abdomen is distended, soft and nontender.  Bowel sounds are heard Extremities: Trace lower extremity edema; no clubbing Central nervous system: Awake; slow to respond.  Poor historian.  No focal neurological deficits. Moving extremities Skin: No obvious ecchymosis/lesions  psychiatry: Flat affect  Data Reviewed: I have personally reviewed following labs and imaging studies  CBC: Recent Labs  Lab 03/31/21 0053 04/01/21 0046 04/02/21 0037 04/03/21 0645 04/04/21 0411  WBC 14.7* 14.9* 21.3* 16.9* 13.7*  NEUTROABS  --   --  18.2*  --   --   HGB 9.6* 9.9* 11.4* 9.1* 8.4*  HCT 27.9* 29.2* 34.1* 26.4* 25.3*  MCV 74.4* 74.9* 75.3* 73.5* 76.2*  PLT 344 402* 522* PLATELET CLUMPS NOTED ON SMEAR, UNABLE TO ESTIMATE 539    Basic Metabolic Panel: Recent Labs  Lab 03/30/21 0040 03/30/21 0109 03/31/21 0053 04/02/21 0037 04/03/21 0645 04/04/21 0411  NA 134*  --  135 133* 132* 135  K 4.7  --  4.2 4.4 3.7 3.9  CL 104  --  102 101 100 107  CO2 19*  --  21* 20* 23 21*  GLUCOSE 116*  --  118* 118* 102* 93  BUN 30*  --  25* 23 20 28*  CREATININE 0.67  --  0.64 0.81 0.68 0.73  CALCIUM 8.3*  --  8.1* 8.5* 8.3* 8.2*  MG  --  2.1 1.9 1.8  --  1.8  PHOS 2.9  --  2.2* 3.3  --   --     GFR: Estimated Creatinine Clearance: 34.7 mL/min (by C-G formula based on SCr of 0.73 mg/dL). Liver Function  Tests: Recent Labs  Lab 03/29/21 0426 03/30/21 0040 03/31/21 0053 04/02/21 0037 04/03/21 0645 04/04/21 0411  AST 57*  --  50* 42* 25 21  ALT 50*  --  46* 50* 31 26  ALKPHOS 94  --  84 90 72 85  BILITOT 0.9  --  0.5 0.9 0.6 0.5  PROT 6.3*  --  6.3* 6.8 5.8* 5.9*  ALBUMIN 1.9* 1.9* 1.8*   1.9* 2.2* 1.8* 1.7*    No results for input(s): LIPASE, AMYLASE in the last 168 hours. No results for input(s): AMMONIA in the last 168 hours. Coagulation Profile: No results for input(s): INR, PROTIME in the last 168 hours. Cardiac Enzymes: No results for input(s): CKTOTAL, CKMB, CKMBINDEX, TROPONINI in the last 168 hours.  BNP (last 3 results) No results for input(s): PROBNP in the last 8760 hours. HbA1C: No results for input(s): HGBA1C in the last 72 hours. CBG: No results for input(s): GLUCAP in the last 168 hours. Lipid Profile: No  results for input(s): CHOL, HDL, LDLCALC, TRIG, CHOLHDL, LDLDIRECT in the last 72 hours. Thyroid Function Tests: Recent Labs    04/04/21 0411  TSH 4.732*   Anemia Panel: No results for input(s): VITAMINB12, FOLATE, FERRITIN, TIBC, IRON, RETICCTPCT in the last 72 hours. Sepsis Labs: Recent Labs  Lab 04/01/21 1841 04/01/21 2207 04/02/21 0037 04/03/21 0618  LATICACIDVEN 4.9* 3.2* 3.6* 1.8     Recent Results (from the past 240 hour(s))  Urine Culture     Status: Abnormal   Collection Time: 03/26/21  8:17 AM   Specimen: Urine, Clean Catch  Result Value Ref Range Status   Specimen Description URINE, CLEAN CATCH  Final   Special Requests   Final    NONE Performed at [facility], [place] [address], [place], [place] [address]    Culture MULTIPLE SPECIES PRESENT, SUGGEST RECOLLECTION (A)  Final   Report Status 03/28/2021 FINAL  Final  Culture, blood (routine x 2)     Status: None   Collection Time: 03/26/21  9:27 AM   Specimen: BLOOD RIGHT HAND  Result Value Ref Range Status   Specimen Description BLOOD RIGHT HAND  Final   Special Requests    Final    AEROBIC BOTTLE ONLY Blood Culture results may not be optimal due to an inadequate volume of blood received in culture bottles   Culture   Final    NO GROWTH 5 DAYS Performed at [facility], [place] [address], [place], [place] [address]    Report Status 03/31/2021 FINAL  Final  Culture, blood (routine x 2)     Status: None   Collection Time: 03/26/21 10:33 AM   Specimen: BLOOD RIGHT HAND  Result Value Ref Range Status   Specimen Description BLOOD RIGHT HAND  Final   Special Requests   Final    AEROBIC BOTTLE ONLY Blood Culture results may not be optimal due to an inadequate volume of blood received in culture bottles   Culture   Final    NO GROWTH 5 DAYS Performed at [facility], [place] [address], [place], [place] [address]    Report Status 03/31/2021 FINAL  Final  Surgical pcr screen     Status: None   Collection Time: 04/02/21  5:57 AM   Specimen: Nasal Mucosa; Nasal Swab  Result Value Ref Range Status   MRSA, PCR NEGATIVE NEGATIVE Final   Staphylococcus aureus NEGATIVE NEGATIVE Final    Comment: (NOTE) The Xpert SA Assay (FDA approved for NASAL specimens in patients 54 years of age and older), is one component of a comprehensive surveillance program. It is not intended to diagnose infection nor to guide or monitor treatment. Performed at [facility], [place] [address] [place], [place] [address]   Aerobic/Anaerobic Culture w Gram Stain (surgical/deep wound)     Status: None (Preliminary result)   Collection Time: 04/02/21  7:14 AM   Specimen: PATH Cytology Misc. fluid; Body Fluid  Result Value Ref Range Status   Specimen Description FLUID  Final   Special Requests PERICARDIAL FLUID SPEC A  Final   Gram Stain   Final    FEW WBC PRESENT,BOTH PMN AND MONONUCLEAR NO ORGANISMS SEEN    Culture   Final    NO GROWTH 1 DAY Performed at [facility], [place] [address] [place],  [address]    Report Status PENDING  Incomplete  Fungus Stain      Status: None   Collection Time: 04/02/21  7:14 AM   Specimen: PATH Cytology Misc. fluid; Body Fluid  Result Value Ref Range Status   FUNGUS STAIN Final report  Final    Comment: (NOTE) Performed At: [facility] [address] [place], [place] [id number] Rush Farmer MD PT:[id number]    Fungal Source FLUID  Final    Comment: Performed at [facility], [place] [address], [place], [place] [address]  Acid Fast Smear (AFB)     Status: None   Collection Time: 04/02/21  7:14 AM   Specimen: PATH Cytology Misc. fluid; Body Fluid  Result Value Ref Range Status   AFB Specimen Processing Concentration  Final   Acid Fast Smear Negative  Final    Comment: (NOTE) Performed At: [facility] [place], [place] [id number] Rush Farmer MD FM:[id number]    Source (AFB) PERICARDIAL FLUID SPEC A  Final    Comment: Performed at [facility], [place] [address] [place], [place] [address]  Fungal Stain reflex     Status: None   Collection Time: 04/02/21  7:14 AM  Result Value Ref Range Status   Fungal stain result 1 Comment  Final    Comment: (NOTE) KOH/Calcofluor preparation:  no fungus observed. Performed At: [facility] [place], [place] [id number] Rush Farmer MD ZR:[id number]           Radiology Studies: DG Chest Port 1 View  Result Date: 04/03/2021 CLINICAL DATA:  Chest tube.  Pericardial effusion. EXAM: PORTABLE CHEST 1 VIEW COMPARISON:  One-view chest x-ray 04/02/2021. FINDINGS: Marked is enlarged. Mediastinal drain and right-sided chest tube are stable. No pneumothorax is present. Small left pleural effusion and left basilar airspace disease remains. The lungs are otherwise clear. Surgical clips are present in the left axilla. IMPRESSION: 1. Stable cardiomegaly without failure. 2. Stable small left pleural effusion and left basilar airspace disease. 3. Stable right-sided chest tube without pneumothorax. Electronically  Signed   By: San Morelle M.D.   On: 04/03/2021 09:28   DG Chest Port 1 View  Result Date: 04/02/2021 CLINICAL DATA:  Status post pericardial window with thoracotomy EXAM: PORTABLE CHEST 1 VIEW COMPARISON:  04/01/2021 FINDINGS: Unchanged mild cardiomegaly. Minimal left basilar atelectasis. Lungs otherwise clear. No pulmonary vascular congestion. Interval placement of right IJ central venous catheter which terminates at the cavoatrial junction. Mediastinal drain is seen. There is a right chest tube also in place. No pneumothorax. IMPRESSION: Unchanged mild cardiomegaly and mild left basilar atelectasis. Electronically Signed   By: Miachel Roux M.D.   On: 04/02/2021 12:47   ECHOCARDIOGRAM LIMITED  Result Date: 04/03/2021    ECHOCARDIOGRAM LIMITED REPORT   Patient Name:   JOHNIECE HORNBAKER Date of Exam: 04/03/2021 Medical Rec #:  [id number]          Height:       61.0 in Accession #:    [id number]         Weight:       95.5 lb Date of Birth:  Apr 13, 1941           BSA:          1.379 m Patient Age:    20 years           BP:           91/58 mmHg Patient Gender: F                  HR:  92 bpm. Exam Location:  Inpatient Procedure: Cardiac Doppler, Color Doppler and Limited Echo Indications:    f/u pericardial effusion  History:        Patient has prior history of Echocardiogram examinations, most                 recent 04/01/2021. Pericarditis, Signs/Symptoms:Chest Pain; Risk                 Factors:Hypertension.  Sonographer:    Glo Herring Referring Phys: [id number] [place]  1. There is a small pericaridal effusion, greatest anterior to the RV. Respirophasic variation of the mitral inflow is < 30%. IVC is not dilated. There is not evidence of RV or RA collapse.  2. Left ventricular ejection fraction, by estimation, is 70 to 75%. The left ventricle has hyperdynamic function. The left ventricle has no regional wall motion abnormalities.  3. Right ventricular systolic  function is normal. The right ventricular size is normal.  4. A small pericardial effusion is present. The pericardial effusion is circumferential.  5. The mitral valve is normal in structure.  6. The aortic valve is tricuspid.  7. The inferior vena cava is normal in size with greater than 50% respiratory variability, suggesting right atrial pressure of 3 mmHg. Comparison(s): A prior study was performed on 04/01/21. Prior images reviewed side by side. Pericardial effusion has decreased from 04/01/21. There is no evidence of increased pericardial pressure in this study. FINDINGS  Left Ventricle: Left ventricular ejection fraction, by estimation, is 70 to 75%. The left ventricle has hyperdynamic function. The left ventricle has no regional wall motion abnormalities. Right Ventricle: The right ventricular size is normal. Right ventricular systolic function is normal. Pericardium: A small pericardial effusion is present. The pericardial effusion is circumferential. Mitral Valve: The mitral valve is normal in structure. Tricuspid Valve: The tricuspid valve is normal in structure. Tricuspid valve regurgitation is mild. Aortic Valve: The aortic valve is tricuspid. Venous: The inferior vena cava is normal in size with greater than 50% respiratory variability, suggesting right atrial pressure of 3 mmHg. IVC IVC diam: 1.60 cm Rudean Haskell MD Electronically signed by Rudean Haskell MD Signature Date/Time: 04/03/2021/5:13:06 PM    Final         Scheduled Meds:  acetaminophen  1,000 mg Oral Q6H   Or   acetaminophen (TYLENOL) oral liquid 160 mg/5 mL  1,000 mg Oral Q6H   bisacodyl  10 mg Oral Daily   Chlorhexidine Gluconate Cloth  6 each Topical Daily   colchicine  0.6 mg Oral Daily   enoxaparin (LOVENOX) injection  30 mg Subcutaneous Daily   feeding supplement  237 mL Oral BID BM   ibuprofen  800 mg Oral TID   loratadine  10 mg Oral Daily   melatonin  5 mg Oral QHS   pantoprazole  40 mg Oral Daily    polyethylene glycol  17 g Oral BID   senna-docusate  1 tablet Oral BID   Continuous Infusions:          Aline August, MD [facility] 04/04/2021, 7:10 AM '

## 2021-04-04 NOTE — Progress Notes (Signed)
Nutrition Follow-up  DOCUMENTATION CODES:   Severe malnutrition in context of chronic illness, Underweight  INTERVENTION:   - Encourage PO intake   - Continue Ensure Enlive po BID, each supplement provides 350 kcal and 20 grams of protein   - Continue liberalized Regular diet  - Add MVI with minerals daily  NUTRITION DIAGNOSIS:   Severe Malnutrition related to chronic illness as evidenced by severe muscle depletion, severe fat depletion.  Ongoing, being addressed via liberalized diet and oral nutrition supplements  GOAL:   Patient will meet greater than or equal to 90% of their needs  Progressing  MONITOR:   PO intake, Supplement acceptance, Labs, Weight trends  REASON FOR ASSESSMENT:   Consult Assessment of nutrition requirement/status  ASSESSMENT:   Pt admitted from home with chest pains x1-2 weeks secondary to pericarditis and pericardial effusion. Pt continues with recurrent fevers throughout admission. PMH includes asthma, cancer, HTN, stroke, recent flu.  12/13 - s/p R VATS drainage of pericardial effusion  Spoke with pt and oldest daughter Santiago Glad at bedside. Pt and daughter report that pt's appetite and PO intake have greatly improved over the last few days. Pt's daughter asking what types of food pt can eat. Discussed Regular diet order and that pt has no restrictions. Pt's daughter may bring in food from home/restaurant to encourage more PO intake.  Pt states that she has been drinking the Ensure supplements and prefers the vanilla flavor. Spoke with NT who reports pt ate most of breakfast meal tray. No meal completion documented for breakfast this morning.  Admit weight: 43.3 kg Current weight: 40.6 kg  Meal Completion: 30-80% x last 5 documented meals  Medications reviewed and include: dulcolax, Ensure Enlive BID, melatonin, protonix, miralax, senna  Labs reviewed: BUN 28, hemoglobin 8.4  JP drain: 30 ml x 24 hours Chest tube: 66 ml x 24  hours I/O's: +1.7 L since admit  Diet Order:   Diet Order             Diet regular Room service appropriate? Yes; Fluid consistency: Thin  Diet effective now                   EDUCATION NEEDS:   Education needs have been addressed  Skin:  Skin Assessment: Skin Integrity Issues: Incisions: R chest  Last BM:  04/01/11  Height:   Ht Readings from Last 1 Encounters:  03/23/21 5\' 1"  (1.549 m)    Weight:   Wt Readings from Last 1 Encounters:  04/04/21 40.6 kg    BMI:  Body mass index is 16.91 kg/m.  Estimated Nutritional Needs:   Kcal:  1450-1650  Protein:  75-85g  Fluid:  >/=1.5L    Gustavus Bryant, MS, RD, LDN Inpatient Clinical Dietitian Please see AMiON for contact information.

## 2021-04-04 NOTE — TOC Progression Note (Signed)
Transition of Care [facility]) - Progression Note    Patient Details  Name: Nicole Rush MRN: [id number] Date of Birth: 03-02-1941  Transition of Care [facility]) CM/SW Contact  Zenon Mayo, RN Phone Number: 04/04/2021, 3:08 PM  Clinical Narrative:    NCM spoke with Adela Lank with [employer], she states they spoke with the [place] and the DME will be delivered to patient 's home today or tomorrow.  And Adela Lank also spoke with the grand daughter.    Expected Discharge Plan: [place] Barriers to Discharge: Continued Medical Work up  Expected Discharge Plan and Services Expected Discharge Plan: [place]                         DME Arranged: Bedside commode, Walker rolling DME Agency: [employer] Date DME Agency Contacted: 03/25/21 Time DME Agency Contacted: [phone] Representative spoke with at DME Agency: [place]: PT, OT [place] Agency: [place] Date [place]: 03/25/21 Time [place]: [place] Representative spoke with at [place]: [place] ([place]) Interventions    Readmission Risk Interventions No flowsheet data found.

## 2021-04-04 NOTE — Progress Notes (Signed)
Physical Therapy Treatment Patient Details Name: Nicole Rush MRN: [id number] DOB: 1941-03-22 Today's Date: 04/04/2021   History of Present Illness Pt is an 80 y.o. female admitted 03/21/21 with chest pain. Upon work up, suspect pericarditis likely related to recent flu dx. Afebrile since 12/9 but persistent leukocytosis. Repeat TTE 12/12 showed large pericardial effusion with tamponade. S/p VATs and pericardial window on 12/13. PMH includes asthma, breast cancer, HTN, CVA (no residual deficits).    PT Comments    Pt progressing well with mobility, tolerating a longer bout of ambulation in hall today with RW and min guard. Pt limited by generalized weakness, decreased activity tolerance, and impaired balance strategies. Continue to recommend HHPT after discharge to maximize functional mobility and independence. Will continue to follow acutely to address established goals.    Recommendations for follow up therapy are one component of a multi-disciplinary discharge planning process, led by the attending physician.  Recommendations may be updated based on patient status, additional functional criteria and insurance authorization.  Follow Up Recommendations  Home health PT     Assistance Recommended at Discharge Frequent or constant Supervision/Assistance  Equipment Recommendations  Rolling walker (2 wheels);BSC/3in1;Wheelchair (measurements PT);Wheelchair cushion (measurements PT);Hospital bed    Recommendations for Other Services       Precautions / Restrictions Precautions Precautions: Fall;Other (comment) Precaution Comments: chest tube Restrictions Weight Bearing Restrictions: No     Mobility  Bed Mobility Overal bed mobility: Needs Assistance Bed Mobility: Supine to Sit;Sit to Supine     Supine to sit: Min assist;HOB elevated Sit to supine: Supervision   General bed mobility comments: MinA for HHA to elevate trunk then pt able to scoot self to EOB; return to supine  with supervision for line management    Transfers Overall transfer level: Needs assistance Equipment used: Rolling walker (2 wheels) Transfers: Sit to/from Stand Sit to Stand: Min guard           General transfer comment: stood from EOB with increased time and min guard for safety; good use of UE without cues    Ambulation/Gait Ambulation/Gait assistance: Min guard;Supervision Gait Distance (Feet): 142 Feet Assistive device: Rolling walker (2 wheels) Gait Pattern/deviations: Step-through pattern;Trunk flexed;Decreased stride length Gait velocity: Decreased     General Gait Details: Pt agreeable to hallway ambulation today. Slow guarded gait with RW. Intermittent min guard for turns and RW management.   Stairs             Wheelchair Mobility    Modified Rankin (Stroke Patients Only)       Balance Overall balance assessment: Needs assistance Sitting-balance support: No upper extremity supported;Feet supported Sitting balance-Leahy Scale: Fair     Standing balance support: Bilateral upper extremity supported;Reliant on assistive device for balance Standing balance-Leahy Scale: Poor Standing balance comment: reliant on RW in static standing and mobility                            Cognition Arousal/Alertness: Awake/alert Behavior During Therapy: WFL for tasks assessed/performed Overall Cognitive Status: Impaired/Different from baseline Area of Impairment: Attention;Memory;Following commands;Safety/judgement;Awareness;Problem solving                   Current Attention Level: Selective Memory: Decreased short-term memory Following Commands: Follows one step commands consistently Safety/Judgement: Decreased awareness of safety   Problem Solving: Slow processing;Requires verbal cues General Comments: follows directions consistently; benefits from safety cues with rolling walker  Exercises      General Comments         Pertinent Vitals/Pain Pain Assessment: No/denies pain    Home Living                          Prior Function            PT Goals (current goals can now be found in the care plan section) Acute Rehab PT Goals Patient Stated Goal: Return home with assist from family PT Goal Formulation: With patient/family Time For Goal Achievement: 04/16/21 Potential to Achieve Goals: Good Progress towards PT goals: Progressing toward goals    Frequency    Min 3X/week      PT Plan Current plan remains appropriate    Co-evaluation              AM-PAC PT "6 Clicks" Mobility   Outcome Measure  Help needed turning from your back to your side while in a flat bed without using bedrails?: A Little Help needed moving from lying on your back to sitting on the side of a flat bed without using bedrails?: A Little Help needed moving to and from a bed to a chair (including a wheelchair)?: A Little Help needed standing up from a chair using your arms (e.g., wheelchair or bedside chair)?: A Little Help needed to walk in hospital room?: A Little Help needed climbing 3-5 steps with a railing? : A Lot 6 Click Score: 17    End of Session   Activity Tolerance: Patient tolerated treatment well Patient left: with bed alarm set;in bed;with call bell/phone within reach Nurse Communication: Mobility status PT Visit Diagnosis: Other abnormalities of gait and mobility (R26.89);Muscle weakness (generalized) (M62.81)     Time: 4431-5400 PT Time Calculation (min) (ACUTE ONLY): 26 min  Charges:  $Therapeutic Exercise: 23-37 mins                     Nicole Rush, SPT    Nicole Rush 04/04/2021, 4:30 PM

## 2021-04-05 ENCOUNTER — Inpatient Hospital Stay: Payer: Medicare HMO | Admitting: Pulmonary Disease

## 2021-04-05 ENCOUNTER — Inpatient Hospital Stay (HOSPITAL_COMMUNITY): Payer: Medicare HMO

## 2021-04-05 LAB — CBC WITH DIFFERENTIAL/PLATELET
Abs Immature Granulocytes: 0.04 10*3/uL (ref 0.00–0.07)
Basophils Absolute: 0.1 10*3/uL (ref 0.0–0.1)
Basophils Relative: 1 %
Eosinophils Absolute: 0.2 10*3/uL (ref 0.0–0.5)
Eosinophils Relative: 2 %
HCT: 24.5 % — ABNORMAL LOW (ref 36.0–46.0)
Hemoglobin: 8.4 g/dL — ABNORMAL LOW (ref 12.0–15.0)
Immature Granulocytes: 0 %
Lymphocytes Relative: 13 %
Lymphs Abs: 1.2 10*3/uL (ref 0.7–4.0)
MCH: 25.7 pg — ABNORMAL LOW (ref 26.0–34.0)
MCHC: 34.3 g/dL (ref 30.0–36.0)
MCV: 74.9 fL — ABNORMAL LOW (ref 80.0–100.0)
Monocytes Absolute: 0.3 10*3/uL (ref 0.1–1.0)
Monocytes Relative: 4 %
Neutro Abs: 7.4 10*3/uL (ref 1.7–7.7)
Neutrophils Relative %: 80 %
Platelets: 437 10*3/uL — ABNORMAL HIGH (ref 150–400)
RBC: 3.27 MIL/uL — ABNORMAL LOW (ref 3.87–5.11)
RDW: 15.5 % (ref 11.5–15.5)
WBC: 9.2 10*3/uL (ref 4.0–10.5)
nRBC: 0 % (ref 0.0–0.2)

## 2021-04-05 LAB — MAGNESIUM: Magnesium: 1.7 mg/dL (ref 1.7–2.4)

## 2021-04-05 LAB — BASIC METABOLIC PANEL
Anion gap: 7 (ref 5–15)
BUN: 27 mg/dL — ABNORMAL HIGH (ref 8–23)
CO2: 24 mmol/L (ref 22–32)
Calcium: 7.9 mg/dL — ABNORMAL LOW (ref 8.9–10.3)
Chloride: 107 mmol/L (ref 98–111)
Creatinine, Ser: 0.7 mg/dL (ref 0.44–1.00)
GFR, Estimated: >60 mL/min (ref >60)
Glucose, Bld: 89 mg/dL (ref 70–99)
Potassium: 3.7 mmol/L (ref 3.5–5.1)
Sodium: 138 mmol/L (ref 135–145)

## 2021-04-05 LAB — T4, FREE: Free T4: 0.94 ng/dL (ref 0.61–1.12)

## 2021-04-05 NOTE — Progress Notes (Signed)
Patient ID: Nicole Rush, female   DOB: [birth year], 80 y.o.   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  Cardiology was consulted.  TTE showed EF of 65 to 70% with moderate TVR and trivial pericardial effusion.  She was started on prednisone and colchicine for possible postviral pericarditis  She spiked a temperature on 03/26/2021.  Empirically started on Rocephin.  Infectious work-up unrevealing so far.  Afebrile since 03/29/2021 but persistent leukocytosis.  Patient with intermittent chest pain.  Repeat TTE on 04/01/2021 showed large pericardial effusion with possible tamponade.  She underwent VATS and pericardial window on 04/02/2021.  Assessment & Plan:   Left-sided chest pain due to pericarditis/large pericardial effusion with cardiac tamponade -Pericarditis was felt to be postviral from influenza A infection.  TTE as above. -Status post VATS and pericardial window by CVTS on 04/02/2021.  Chest tube in place.  JP drain removed on 04/04/2021 by CVTS.  Follow further CVTS recommendations. -Continue colchicine twice a day for 6 months as per cardiology.  Prednisone discontinued by cardiology on 04/03/2021.  Ibuprofen to be continued 800 mg ibuprofen 3 times daily for 10 days followed by 800 mg twice daily for 10 days followed by 800 mg daily for 10 days as per cardiology recommendations  Influenza A -Initially diagnosed on 03/17/2021.  Completed course of Tamiflu.  Fever -Possibly from pericarditis.  Infectious work-up negative so far with negative blood cultures.  No further fever since 03/29/2021. -Empirically started on IV Rocephin:  Completed 5-day course of antibiotic therapy  Acute respiratory failure with hypoxia -Possibly from influenza. -Currently on room air. -PCCM recommended outpatient follow-up for calcified granuloma in the left lung  Elevated troponin -Felt to be due to pericarditis.  Cardiology following  Lactic acidosis -Likely due to poor perfusion in the setting of tamponade and pericardial effusion. -Improved  Leukocytosis -Resolved  Microcytic anemia -Anemia panel consistent with anemia of chronic disease.  Hemoglobin stable.  Essential hypertension -Blood pressure on the lower side.  Home losartan and hydrochlorothiazide on hold  Hyponatremia -Resolved.  Elevated LFTs -Questionable cause.  Improved.  Crestor on hold  Generalized weakness/physical deconditioning -Will need home health PT.  Patient refused SNF placement -Currently remains full code.  Palliative care following.  Severe malnutrition -Follow nutrition recommendations  DVT prophylaxis: Lovenox Code Status: Full Family Communication: None at bedside Disposition Plan: Status is: Inpatient  Remains inpatient appropriate because: Of severity of illness.  Currently has chest tube    Consultants: Cardiology/cardiothoracic surgery/palliative care  Procedures:  VATS and pericardial window on 04/02/2021  Antimicrobials:  Anti-infectives (From admission, onward)    Start     Dose/Rate Route Frequency Ordered Stop   03/29/21 1400  cefTRIAXone (ROCEPHIN) 1 g in sodium chloride 0.9 % 100 mL IVPB  Status:  Discontinued        1 g 200 mL/hr over 30 Minutes Intravenous Every 24 hours 03/29/21 1221 04/03/21 1241   03/22/21 1000  oseltamivir (TAMIFLU) capsule 30 mg       Note to Pharmacy: Tamiflu 30 mg BID for CrCl <  60 mL/min   30 mg Oral 2 times daily 03/22/21 0304 03/23/21 2208  Subjective: Patient seen and examined at bedside.  Denies worsening shortness of breath, chest pain, fever or vomiting.    Objective: Vitals:   04/04/21 2300 04/04/21 2349 04/05/21 0307 04/05/21 0449  BP:  115/76 100/66   Pulse: 81 85 78 77  Resp: 19 20 20 18   Temp:  97.8 F (36.6 C) 98.4 F (36.9 C)   TempSrc:  Oral Oral   SpO2: 97% 96% 97% 97%  Weight:    40.7 kg  Height:        Intake/Output Summary (Last 24 hours) at 04/05/2021 0711 Last data filed at 04/05/2021 0540 Gross per 24 hour  Intake 240 ml  Output 597 ml  Net -357 ml    Filed Weights   03/23/21 1800 04/04/21 0500 04/05/21 0449  Weight: 43.3 kg 40.6 kg 40.7 kg    Examination:  General exam: No acute distress.  Currently on room air.  Looks chronically ill.  Elderly female lying in bed.   Respiratory system: Ventilated breath sounds at bases with some crackles along with chest tube present  cardiovascular system: S1-S2 heard; rate controlled  gastrointestinal system: Abdomen is mildly distended, soft and nontender.  Normal bowel sounds are heard  extremities: No cyanosis; mild lower extremity edema present  Central nervous system: Alert; still very slow to respond.  Poor historian.  No focal neurological deficits.  Moves extremities  skin: No obvious petechiae/rashes psychiatry: Affect is flat  Data Reviewed: I have personally reviewed following labs and imaging studies  CBC: Recent Labs  Lab 04/01/21 0046 04/02/21 0037 04/03/21 0645 04/04/21 0411 04/05/21 0429  WBC 14.9* 21.3* 16.9* 13.7* 9.2  NEUTROABS  --  18.2*  --   --  7.4  HGB 9.9* 11.4* 9.1* 8.4* 8.4*  HCT 29.2* 34.1* 26.4* 25.3* 24.5*  MCV 74.9* 75.3* 73.5* 76.2* 74.9*  PLT 402* 522* PLATELET CLUMPS NOTED ON SMEAR, UNABLE TO ESTIMATE 399 437*    Basic Metabolic Panel: Recent Labs  Lab 03/30/21 0040 03/30/21 0109 03/31/21 0053 04/02/21 0037 04/03/21 0645 04/04/21 0411 04/05/21 0429  NA 134*  --  135 133* 132* 135 138  K 4.7  --  4.2 4.4 3.7 3.9 3.7  CL 104  --  102 101 100 107 107  CO2 19*  --  21* 20* 23 21* 24  GLUCOSE 116*  --  118* 118* 102*  93 89  BUN 30*  --  25* 23 20 28* 27*  CREATININE 0.67  --  0.64 0.81 0.68 0.73 0.70  CALCIUM 8.3*  --  8.1* 8.5* 8.3* 8.2* 7.9*  MG  --  2.1 1.9 1.8  --  1.8 1.7  PHOS 2.9  --  2.2* 3.3  --   --   --     GFR: Estimated Creatinine Clearance: 34.8 mL/min (by C-G formula based on SCr of 0.7 mg/dL). Liver Function Tests: Recent Labs  Lab 03/30/21 0040 03/31/21 0053 04/02/21 0037 04/03/21 0645 04/04/21 0411  AST  --  50* 42* 25 21  ALT  --  46* 50* 31 26  ALKPHOS  --  84 90 72 85  BILITOT  --  0.5 0.9 0.6 0.5  PROT  --  6.3* 6.8 5.8* 5.9*  ALBUMIN 1.9* 1.8*   1.9* 2.2* 1.8* 1.7*    No results for input(s): LIPASE, AMYLASE in the last 168 hours. No results for input(s): AMMONIA in the last 168 hours. Coagulation Profile: No results for input(s): INR, PROTIME in the last 168 hours. Cardiac  Enzymes: No results for input(s): CKTOTAL, CKMB, CKMBINDEX, TROPONINI in the last 168 hours.  BNP (last 3 results) No results for input(s): PROBNP in the last 8760 hours. HbA1C: No results for input(s): HGBA1C in the last 72 hours. CBG: No results for input(s): GLUCAP in the last 168 hours. Lipid Profile: No results for input(s): CHOL, HDL, LDLCALC, TRIG, CHOLHDL, LDLDIRECT in the last 72 hours. Thyroid Function Tests: Recent Labs    04/04/21 0411 04/05/21 0429  TSH 4.732*  --   FREET4  --  0.94    Anemia Panel: No results for input(s): VITAMINB12, FOLATE, FERRITIN, TIBC, IRON, RETICCTPCT in the last 72 hours. Sepsis Labs: Recent Labs  Lab 04/01/21 1841 04/01/21 2207 04/02/21 0037 04/03/21 0618  LATICACIDVEN 4.9* 3.2* 3.6* 1.8     Recent Results (from the past 240 hour(s))  Urine Culture     Status: Abnormal   Collection Time: 03/26/21  8:17 AM   Specimen: Urine, Clean Catch  Result Value Ref Range Status   Specimen Description URINE, CLEAN CATCH  Final   Special Requests   Final    NONE Performed at [facility], [place] [address] [place], [place] [address]     Culture MULTIPLE SPECIES PRESENT, SUGGEST RECOLLECTION (A)  Final   Report Status 03/28/2021 FINAL  Final  Culture, blood (routine x 2)     Status: None   Collection Time: 03/26/21  9:27 AM   Specimen: BLOOD RIGHT HAND  Result Value Ref Range Status   Specimen Description BLOOD RIGHT HAND  Final   Special Requests   Final    AEROBIC BOTTLE ONLY Blood Culture results may not be optimal due to an inadequate volume of blood received in culture bottles   Culture   Final    NO GROWTH 5 DAYS Performed at [facility], [place] [address] [place], [place] [address]    Report Status 03/31/2021 FINAL  Final  Culture, blood (routine x 2)     Status: None   Collection Time: 03/26/21 10:33 AM   Specimen: BLOOD RIGHT HAND  Result Value Ref Range Status   Specimen Description BLOOD RIGHT HAND  Final   Special Requests   Final    AEROBIC BOTTLE ONLY Blood Culture results may not be optimal due to an inadequate volume of blood received in culture bottles   Culture   Final    NO GROWTH 5 DAYS Performed at [facility], [place] [address] [place], [place] [address]    Report Status 03/31/2021 FINAL  Final  Surgical pcr screen     Status: None   Collection Time: 04/02/21  5:57 AM   Specimen: Nasal Mucosa; Nasal Swab  Result Value Ref Range Status   MRSA, PCR NEGATIVE NEGATIVE Final   Staphylococcus aureus NEGATIVE NEGATIVE Final    Comment: (NOTE) The Xpert SA Assay (FDA approved for NASAL specimens in patients 43 years of age and older), is one component of a comprehensive surveillance program. It is not intended to diagnose infection nor to guide or monitor treatment. Performed at [facility], [place] [address], [place],  [address]   Aerobic/Anaerobic Culture w Gram Stain (surgical/deep wound)     Status: None (Preliminary result)   Collection Time: 04/02/21  7:14 AM   Specimen: PATH Cytology Misc. fluid; Body Fluid  Result Value Ref Range Status   Specimen Description  FLUID  Final   Special Requests PERICARDIAL FLUID SPEC A  Final  Gram Stain   Final    FEW WBC PRESENT,BOTH PMN AND MONONUCLEAR NO ORGANISMS SEEN    Culture   Final    NO GROWTH 2 DAYS NO ANAEROBES ISOLATED; CULTURE IN PROGRESS FOR 5 DAYS Performed at [facility] [address], [place], [place] [address]    Report Status PENDING  Incomplete  Fungus Stain     Status: None   Collection Time: 04/02/21  7:14 AM   Specimen: PATH Cytology Misc. fluid; Body Fluid  Result Value Ref Range Status   FUNGUS STAIN Final report  Final    Comment: (NOTE) Performed At: [facility] [address] [place], [place] [id number] Rush Farmer MD [id number]    Fungal Source FLUID  Final    Comment: Performed at [facility], [place] [address], [place], [place] [address]  Acid Fast Smear (AFB)     Status: None   Collection Time: 04/02/21  7:14 AM   Specimen: PATH Cytology Misc. fluid; Body Fluid  Result Value Ref Range Status   AFB Specimen Processing Concentration  Final   Acid Fast Smear Negative  Final    Comment: (NOTE) Performed At: [facility] [place], [place] [id number] Rush Farmer MD [id number]    Source (AFB) PERICARDIAL FLUID SPEC A  Final    Comment: Performed at [facility], [place] [address], [place], [place] [address]  Fungal Stain reflex     Status: None   Collection Time: 04/02/21  7:14 AM  Result Value Ref Range Status   Fungal stain result 1 Comment  Final    Comment: (NOTE) KOH/Calcofluor preparation:  no fungus observed. Performed At: [facility] [place], [place] [id number] Rush Farmer MD [id number]           Radiology Studies: DG CHEST PORT 1 VIEW  Result Date: 04/04/2021 CLINICAL DATA:  Chest tube present, pericardial effusion EXAM: PORTABLE CHEST 1 VIEW COMPARISON:  04/03/2021 FINDINGS: Unchanged AP portable examination. Cardiomegaly. Right neck vascular catheter.  Right-sided chest and mediastinal drainage tubes. No pneumothorax. Trace bilateral pleural effusions and mild, diffuse interstitial pulmonary opacity. No new or focal airspace opacity. IMPRESSION: 1.  Unchanged AP portable examination. Cardiomegaly. 2. Right-sided chest and mediastinal drainage tubes. No pneumothorax. 3. Trace bilateral pleural effusions and mild, diffuse interstitial pulmonary opacity, consistent with edema. No new or focal airspace opacity. Electronically Signed   By: Delanna Ahmadi M.D.   On: 04/04/2021 09:02   ECHOCARDIOGRAM LIMITED  Result Date: 04/03/2021    ECHOCARDIOGRAM LIMITED REPORT   Patient Name:   Nicole Rush Date of Exam: 04/03/2021 Medical Rec #:  [id number]          Height:       61.0 in Accession #:    [id number]         Weight:       95.5 lb Date of Birth:  1940/11/30           BSA:          1.379 m Patient Age:    50 years           BP:           91/58 mmHg Patient Gender: F                  HR:           92 bpm. Exam Location:  Inpatient Procedure: Cardiac Doppler, Color Doppler and Limited  Echo Indications:    f/u pericardial effusion  History:        Patient has prior history of Echocardiogram examinations, most                 recent 04/01/2021. Pericarditis, Signs/Symptoms:Chest Pain; Risk                 Factors:Hypertension.  Sonographer:    Glo Herring Referring Phys: [id number] [place]  1. There is a small pericaridal effusion, greatest anterior to the RV. Respirophasic variation of the mitral inflow is < 30%. IVC is not dilated. There is not evidence of RV or RA collapse.  2. Left ventricular ejection fraction, by estimation, is 70 to 75%. The left ventricle has hyperdynamic function. The left ventricle has no regional wall motion abnormalities.  3. Right ventricular systolic function is normal. The right ventricular size is normal.  4. A small pericardial effusion is present. The pericardial effusion is circumferential.  5. The mitral  valve is normal in structure.  6. The aortic valve is tricuspid.  7. The inferior vena cava is normal in size with greater than 50% respiratory variability, suggesting right atrial pressure of 3 mmHg. Comparison(s): A prior study was performed on 04/01/21. Prior images reviewed side by side. Pericardial effusion has decreased from 04/01/21. There is no evidence of increased pericardial pressure in this study. FINDINGS  Left Ventricle: Left ventricular ejection fraction, by estimation, is 70 to 75%. The left ventricle has hyperdynamic function. The left ventricle has no regional wall motion abnormalities. Right Ventricle: The right ventricular size is normal. Right ventricular systolic function is normal. Pericardium: A small pericardial effusion is present. The pericardial effusion is circumferential. Mitral Valve: The mitral valve is normal in structure. Tricuspid Valve: The tricuspid valve is normal in structure. Tricuspid valve regurgitation is mild. Aortic Valve: The aortic valve is tricuspid. Venous: The inferior vena cava is normal in size with greater than 50% respiratory variability, suggesting right atrial pressure of 3 mmHg. IVC IVC diam: 1.60 cm Rudean Haskell MD Electronically signed by Rudean Haskell MD Signature Date/Time: 04/03/2021/5:13:06 PM    Final         Scheduled Meds:  acetaminophen  1,000 mg Oral Q6H   Or   acetaminophen (TYLENOL) oral liquid 160 mg/5 mL  1,000 mg Oral Q6H   bisacodyl  10 mg Oral Daily   Chlorhexidine Gluconate Cloth  6 each Topical Daily   colchicine  0.6 mg Oral Daily   enoxaparin (LOVENOX) injection  30 mg Subcutaneous Daily   feeding supplement  237 mL Oral BID BM   ibuprofen  800 mg Oral TID   loratadine  10 mg Oral Daily   melatonin  5 mg Oral QHS   multivitamin with minerals  1 tablet Oral Daily   pantoprazole  40 mg Oral Daily   polyethylene glycol  17 g Oral BID   senna-docusate  1 tablet Oral BID   Continuous  Infusions:          Aline August, MD [facility] 04/05/2021, 7:11 AM '

## 2021-04-05 NOTE — Progress Notes (Addendum)
° °   °  [address]       [place] [address]             [phone]      3 Days Post-Op Procedure(s) (LRB): PERICARDIAL WINDOW WITH THORACOTOMY APPROACH (N/A)  Subjective:  Patient denies pain.  Still short of breath at times.  Objective: Vital signs in last 24 hours: Temp:  [97.8 F (36.6 C)-99.1 F (37.3 C)] 99.1 F (37.3 C) (12/16 0749) Pulse Rate:  [77-89] 89 (12/16 0749) Cardiac Rhythm: Normal sinus rhythm (12/16 0700) Resp:  [18-20] 19 (12/16 0749) BP: (100-126)/(64-76) 117/64 (12/16 0749) SpO2:  [96 %-98 %] 98 % (12/16 0749) Weight:  [40.7 kg] 40.7 kg (12/16 0449)  Intake/Output from previous day: 12/15 0701 - 12/16 0700 In: 240 [P.O.:240] Out: 597 [Urine:475; Drains:10; Chest Tube:112] Intake/Output this shift: Total I/O In: 240 [P.O.:240] Out: 30 [Chest Tube:30]  General appearance: alert, cooperative, and no distress Heart: regular rate and rhythm Lungs: clear to auscultation bilaterally Wound: clean and dry  Lab Results: Recent Labs    04/04/21 0411 04/05/21 0429  WBC 13.7* 9.2  HGB 8.4* 8.4*  HCT 25.3* 24.5*  PLT 399 437*   BMET:  Recent Labs    04/04/21 0411 04/05/21 0429  NA 135 138  K 3.9 3.7  CL 107 107  CO2 21* 24  GLUCOSE 93 89  BUN 28* 27*  CREATININE 0.73 0.70  CALCIUM 8.2* 7.9*    PT/INR: No results for input(s): LABPROT, INR in the last 72 hours. ABG No results found for: PHART, HCO3, TCO2, ACIDBASEDEF, O2SAT CBG (last 3)  No results for input(s): GLUCAP in the last 72 hours.  Assessment/Plan: S/P Procedure(s) (LRB): PERICARDIAL WINDOW WITH THORACOTOMY APPROACH (N/A)  S/P VATS with pericardial window creation drainage of effusion.. CT output 10 cc yesterday, will d/c drain today Dispo- patient stable, d/c final chest tube today, care per primary   LOS: 15 days    Ellwood Handler, PA-C 04/05/2021   Agree with above Final CT to be removed today Please call with questions  Lajuana Matte

## 2021-04-05 NOTE — Plan of Care (Signed)

## 2021-04-05 NOTE — TOC Progression Note (Signed)
Transition of Care [facility]) - Progression Note    Patient Details  Name: Elverna Caffee MRN: [id number] Date of Birth: [birth year]  Transition of Care [facility]) CM/SW Contact  Zenon Mayo, RN Phone Number: 04/05/2021, 12:29 PM  Clinical Narrative:    NCM spoke with grand daughter , Rodena Medin, she states she has received the DME, hospital bed, BSC, and transport chair, the walker is on back order. There is a order for a w/chair as well.    Expected Discharge Plan: [place] Barriers to Discharge: Continued Medical Work up  Expected Discharge Plan and Services Expected Discharge Plan: [place]                         DME Arranged: Bedside commode, Walker rolling DME Agency: [employer] Date DME Agency Contacted: 03/25/21 Time DME Agency Contacted: [phone] Representative spoke with at DME Agency: [place]: PT, OT [place] Agency: [place] Date [place]: 03/25/21 Time [place]: [place] Representative spoke with at [place]: [place] ([place]) Interventions    Readmission Risk Interventions No flowsheet data found.

## 2021-04-05 NOTE — Progress Notes (Signed)
Mobility Specialist Progress Note    04/05/21 1039  Mobility  Activity Ambulated in hall  Level of Assistance Minimal assist, patient does 75% or more  Assistive Device Front wheel walker  Distance Ambulated (ft) 125 ft  Mobility Ambulated with assistance in hallway  Mobility Response Tolerated well  Mobility performed by Mobility specialist  $Mobility charge 1 Mobility   Pre-Mobility: 89 HR, 116/70 BP Post-Mobility: 92 HR  Pt received in bed and agreeable. No complaints. Moved slowly but took no breaks. Used minG. Returned to bed with call bell in reach and family present.   [facility] Mobility Specialist  M.S. Primary Phone: [phone] M.S. Secondary Phone: [phone]

## 2021-04-05 NOTE — Progress Notes (Signed)
Physical Therapy Treatment Patient Details Name: Nicole Rush MRN: [id number] DOB: 22-Nov-1940 Today's Date: 04/05/2021   History of Present Illness Pt is an 80 y.o. female admitted 03/21/21 with chest pain. Upon work up, suspect pericarditis likely related to recent flu dx. Afebrile since 12/9 but persistent leukocytosis. Repeat TTE 12/12 showed large pericardial effusion with tamponade. S/p VATs and pericardial window on 12/13. PMH includes asthma, breast cancer, HTN, CVA (no residual deficits).    PT Comments    The pt was able to demo good ability to complete sit-stand transfers and hallway ambulation this session with minG to supervision for all OOB mobility. The pt did require VC to push from seated surface rather than pulling on the RW, which resulted in the pt then only speaking in one-word responses to the therapist for the remainder of the session. The pt was able to follow all cues and express her needs/desires appropriately, but preferred to complete the session without talking. She was unable to further explain if she was frustrated or upset. Will continue to attempt to progress independence with transfer and endurance to allow for full return to prior level of independence and mobility.     Recommendations for follow up therapy are one component of a multi-disciplinary discharge planning process, led by the attending physician.  Recommendations may be updated based on patient status, additional functional criteria and insurance authorization.  Follow Up Recommendations  Home health PT     Assistance Recommended at Discharge Frequent or constant Supervision/Assistance  Equipment Recommendations  Rolling walker (2 wheels);BSC/3in1;Wheelchair (measurements PT);Wheelchair cushion (measurements PT);Hospital bed    Recommendations for Other Services       Precautions / Restrictions Precautions Precautions: Fall;Other (comment) Restrictions Weight Bearing Restrictions: No      Mobility  Bed Mobility Overal bed mobility: Needs Assistance Bed Mobility: Supine to Sit;Sit to Supine     Supine to sit: Supervision;HOB elevated Sit to supine: Supervision   General bed mobility comments: supervision with HOB elevated, no assist given    Transfers Overall transfer level: Needs assistance Equipment used: Rolling walker (2 wheels) Transfers: Sit to/from Stand;Bed to chair/wheelchair/BSC Sit to Stand: Supervision     Step pivot transfers: Supervision     General transfer comment: cues for direction for line management. pt given cues to power up with hands on armrests and was able to complete without assist    Ambulation/Gait Ambulation/Gait assistance: Supervision Gait Distance (Feet): 120 Feet Assistive device: Rolling walker (2 wheels) Gait Pattern/deviations: Step-through pattern;Trunk flexed;Decreased stride length Gait velocity: Decreased Gait velocity interpretation: <1.31 ft/sec, indicative of household ambulator   General Gait Details: supervision, pt tends to hug far to the left on edge of hallway, but did not run into any obstacles in hallway. no LOB or change in VS      Balance Overall balance assessment: Needs assistance Sitting-balance support: No upper extremity supported;Feet supported Sitting balance-Leahy Scale: Fair     Standing balance support: Bilateral upper extremity supported;Reliant on assistive device for balance Standing balance-Leahy Scale: Poor Standing balance comment: pt refused to try to put on mask while standing                            Cognition Arousal/Alertness: Awake/alert Behavior During Therapy: Flat affect;Agitated Overall Cognitive Status: Impaired/Different from baseline Area of Impairment: Attention;Memory;Following commands;Safety/judgement;Awareness;Problem solving                   Current Attention  Level: Selective Memory: Decreased short-term memory Following Commands:  Follows one step commands consistently Safety/Judgement: Decreased awareness of safety;Decreased awareness of deficits Awareness: Emergent Problem Solving: Slow processing;Requires verbal cues General Comments: the pt was agrereable to session, flat but able to follow commands. However, similar to OT session this morning, when PT gave cues for use of UE for sit-stand transfer pt became frustrated and from then on gave only one-word answers to therapist's questions, eventually asking for therapist to stop talking. The pt then would use gestures or single word commands to tell therapist what to do, was unable to further explain frustration        Exercises      General Comments General comments (skin integrity, edema, etc.): VSS on RA. pt concerned about HR, but it remained 88-94bpm with activity      Pertinent Vitals/Pain Pain Assessment: No/denies pain Pain Intervention(s): Monitored during session     PT Goals (current goals can now be found in the care plan section) Acute Rehab PT Goals Patient Stated Goal: Return home with assist from family PT Goal Formulation: With patient/family Time For Goal Achievement: 04/16/21 Potential to Achieve Goals: Good Progress towards PT goals: Progressing toward goals    Frequency    Min 3X/week      PT Plan Current plan remains appropriate       AM-PAC PT "6 Clicks" Mobility   Outcome Measure  Help needed turning from your back to your side while in a flat bed without using bedrails?: A Little Help needed moving from lying on your back to sitting on the side of a flat bed without using bedrails?: A Little Help needed moving to and from a bed to a chair (including a wheelchair)?: A Little Help needed standing up from a chair using your arms (e.g., wheelchair or bedside chair)?: A Little Help needed to walk in hospital room?: A Little Help needed climbing 3-5 steps with a railing? : A Lot 6 Click Score: 17    End of Session  Equipment Utilized During Treatment: Gait belt Activity Tolerance: Patient tolerated treatment well Patient left: with bed alarm set;in bed;with call bell/phone within reach Nurse Communication: Mobility status PT Visit Diagnosis: Other abnormalities of gait and mobility (R26.89);Muscle weakness (generalized) (M62.81)     Time: 1540-0867 PT Time Calculation (min) (ACUTE ONLY): 21 min  Charges:  $Therapeutic Exercise: 8-22 mins                     West Carbo, PT, DPT   Acute Rehabilitation Department Pager #: [phone]   Sandra Cockayne 04/05/2021, 5:17 PM

## 2021-04-05 NOTE — Progress Notes (Signed)
Occupational Therapy Treatment Patient Details Name: Nicole Rush MRN: [id number] DOB: [birth year] Today's Date: 04/05/2021   History of present illness Pt is an 80 y.o. female admitted 03/21/21 with chest pain. Upon work up, suspect pericarditis likely related to recent flu dx. Afebrile since 12/9 but persistent leukocytosis. Repeat TTE 12/12 showed large pericardial effusion with tamponade. S/p VATs and pericardial window on 12/13. PMH includes asthma, breast cancer, HTN, CVA (no residual deficits).   OT comments  Pt progressing gradually towards OT goals. Pt able to demo bed mobility, short mobility in room using RW with Supervision. Pt does require at least one UE support in standing during ADLs standing at sink to maintain balance with unsteadiness noted but able to complete tasks assessed without physical assist. Pt does require encouragement to participate and became easily agitated during session when OT encouraged pt attempt tasks prior to assistance being provided. Due to cognitive, endurance and balance deficits, would continue to rec SNF rehab. However, per notes pt/family opting to take pt home. Would recommend close supervision/assist with all ADLs/mobility initially to decrease fall risk with [facility] therapy follow-up.   Recommendations for follow up therapy are one component of a multi-disciplinary discharge planning process, led by the attending physician.  Recommendations may be updated based on patient status, additional functional criteria and insurance authorization.    Follow Up Recommendations  Skilled nursing-short term rehab (<3 hours/day) (declining SNF; would need HH follow-up)    Assistance Recommended at Discharge Frequent or constant Supervision/Assistance  Equipment Recommendations  BSC/3in1;Other (comment) (Rolling walker)    Recommendations for Other Services      Precautions / Restrictions Precautions Precautions: Fall;Other (comment) Precaution Comments:  chest tube Restrictions Weight Bearing Restrictions: No       Mobility Bed Mobility Overal bed mobility: Needs Assistance Bed Mobility: Supine to Sit     Supine to sit: Supervision;HOB elevated     General bed mobility comments: initially outstretching arms telling therapist to "pull me up". encouraged pt to trial independently first with pt becoming agitated. Slow, deliberate movements with use of bedrails to progressively sit EOB near footboard without assistance    Transfers Overall transfer level: Needs assistance Equipment used: Rolling walker (2 wheels) Transfers: Sit to/from Stand Sit to Stand: Supervision                 Balance Overall balance assessment: Needs assistance Sitting-balance support: No upper extremity supported;Feet supported Sitting balance-Leahy Scale: Fair     Standing balance support: Bilateral upper extremity supported;Reliant on assistive device for balance Standing balance-Leahy Scale: Poor Standing balance comment: reliant on at least one UE support standing at sink                           ADL either performed or assessed with clinical judgement   ADL Overall ADL's : Needs assistance/impaired Eating/Feeding: Set up;Sitting   Grooming: Set up;Standing;Wash/dry face;Wash/dry hands       Lower Body Bathing: Administrator, [employer];Sit to/from stand Lower Body Bathing Details (indicate cue type and reason): for peri care standing at sink                     Functional mobility during ADLs: Supervision/safety;Rolling walker (2 wheels) General ADL Comments: Pt progressing endurance, mobility with decreased physical assist needed though overall agitated easily and required encouragement to participate    Extremity/Trunk Assessment Upper Extremity Assessment Upper Extremity Assessment: Generalized weakness   Lower Extremity  Assessment Lower Extremity Assessment: Defer to PT evaluation        Vision   Vision  Assessment?: No apparent visual deficits   Perception     Praxis      Cognition Arousal/Alertness: Awake/alert Behavior During Therapy: Flat affect;Agitated Overall Cognitive Status: Impaired/Different from baseline Area of Impairment: Attention;Memory;Following commands;Safety/judgement;Awareness;Problem solving                   Current Attention Level: Selective Memory: Decreased short-term memory Following Commands: Follows one step commands consistently Safety/Judgement: Decreased awareness of safety;Decreased awareness of deficits Awareness: Emergent Problem Solving: Slow processing;Requires verbal cues General Comments: required encouragement to participate, easily agitated. pt stretched out her arms in bed, telling therapist "pull me up". Therapist encouraged pt to try first to facilitate independence with tasks when caused pt to become more agitated, overall would not speak to therapist for rest of session. when therapist would ask a question, pt often would look to daughter in room and refer to what therapist said in condescending manner.          Exercises     Shoulder Instructions       General Comments daughter present during session    Pertinent Vitals/ Pain       Pain Assessment: Faces Faces Pain Scale: Hurts little more Pain Location: chest tube insertion site Pain Descriptors / Indicators: Grimacing;Guarding Pain Intervention(s): Monitored during session;Limited activity within patient's tolerance  Home Living                                          Prior Functioning/Environment              Frequency  Min 2X/week        Progress Toward Goals  OT Goals(current goals can now be found in the care plan section)  Progress towards OT goals: Progressing toward goals  Acute Rehab OT Goals Patient Stated Goal: none stated OT Goal Formulation: With patient Time For Goal Achievement: 04/08/21 Potential to Achieve Goals:  Good ADL Goals Pt Will Perform Grooming: with supervision;standing Pt Will Perform Upper Body Bathing: with supervision;sitting Pt Will Perform Lower Body Bathing: with supervision;sit to/from stand Pt Will Perform Upper Body Dressing: with supervision;sitting Pt Will Perform Lower Body Dressing: with supervision;sit to/from stand Pt Will Transfer to Toilet: with supervision;ambulating;bedside commode Additional ADL Goal #1: Pt will be independent in and OOB for basic ADLs Additional ADL Goal #2: Pt will be aware of energy conservation strategies from handout that may be of benefit to her.  Plan Discharge plan remains appropriate    Co-evaluation                 AM-PAC OT "6 Clicks" Daily Activity     Outcome Measure   Help from another person eating meals?: A Little Help from another person taking care of personal grooming?: A Little Help from another person toileting, which includes using toliet, bedpan, or urinal?: A Lot Help from another person bathing (including washing, rinsing, drying)?: A Lot Help from another person to put on and taking off regular upper body clothing?: A Little Help from another person to put on and taking off regular lower body clothing?: A Lot 6 Click Score: 15    End of Session Equipment Utilized During Treatment: Rolling walker (2 wheels)  OT Visit Diagnosis: Unsteadiness on feet (R26.81);Other abnormalities of gait and  mobility (R26.89);Muscle weakness (generalized) (M62.81)   Activity Tolerance Treatment limited secondary to agitation   Patient Left in bed;with call bell/phone within reach;with family/visitor present   Nurse Communication Mobility status        Time: 0160-1093 OT Time Calculation (min): 22 min  Charges: OT General Charges $OT Visit: 1 Visit OT Treatments $Self Care/Home Management : 8-22 mins  Malachy Chamber, OTR/L Acute Rehab Services Office: [phone]   Layla Maw 04/05/2021, 11:53 AM

## 2021-04-06 ENCOUNTER — Inpatient Hospital Stay (HOSPITAL_COMMUNITY): Payer: Medicare HMO

## 2021-04-06 LAB — CBC WITH DIFFERENTIAL/PLATELET
Abs Immature Granulocytes: 0.03 10*3/uL (ref 0.00–0.07)
Basophils Absolute: 0 10*3/uL (ref 0.0–0.1)
Basophils Relative: 1 %
Eosinophils Absolute: 0.2 10*3/uL (ref 0.0–0.5)
Eosinophils Relative: 3 %
HCT: 23.7 % — ABNORMAL LOW (ref 36.0–46.0)
Hemoglobin: 8.1 g/dL — ABNORMAL LOW (ref 12.0–15.0)
Immature Granulocytes: 0 %
Lymphocytes Relative: 19 %
Lymphs Abs: 1.4 10*3/uL (ref 0.7–4.0)
MCH: 25.6 pg — ABNORMAL LOW (ref 26.0–34.0)
MCHC: 34.2 g/dL (ref 30.0–36.0)
MCV: 74.8 fL — ABNORMAL LOW (ref 80.0–100.0)
Monocytes Absolute: 0.3 10*3/uL (ref 0.1–1.0)
Monocytes Relative: 5 %
Neutro Abs: 5.2 10*3/uL (ref 1.7–7.7)
Neutrophils Relative %: 72 %
Platelets: 425 10*3/uL — ABNORMAL HIGH (ref 150–400)
RBC: 3.17 MIL/uL — ABNORMAL LOW (ref 3.87–5.11)
RDW: 15.4 % (ref 11.5–15.5)
WBC: 7.2 10*3/uL (ref 4.0–10.5)
nRBC: 0 % (ref 0.0–0.2)

## 2021-04-06 LAB — BASIC METABOLIC PANEL
Anion gap: 5 (ref 5–15)
BUN: 24 mg/dL — ABNORMAL HIGH (ref 8–23)
CO2: 24 mmol/L (ref 22–32)
Calcium: 7.7 mg/dL — ABNORMAL LOW (ref 8.9–10.3)
Chloride: 110 mmol/L (ref 98–111)
Creatinine, Ser: 0.53 mg/dL (ref 0.44–1.00)
GFR, Estimated: >60 mL/min (ref >60)
Glucose, Bld: 88 mg/dL (ref 70–99)
Potassium: 3.5 mmol/L (ref 3.5–5.1)
Sodium: 139 mmol/L (ref 135–145)

## 2021-04-06 LAB — T3: T3, Total: 78 ng/dL (ref 71–180)

## 2021-04-06 LAB — MAGNESIUM: Magnesium: 1.7 mg/dL (ref 1.7–2.4)

## 2021-04-06 MED ORDER — MITIGARE 0.6 MG PO CAPS: 0.6000 mg | ORAL_CAPSULE | Freq: Every day | ORAL | 0 refills | Status: DC

## 2021-04-06 MED ORDER — PANTOPRAZOLE SODIUM 40 MG PO TBEC: 40.0000 mg | DELAYED_RELEASE_TABLET | Freq: Every day | ORAL | 0 refills | Status: DC

## 2021-04-06 MED ORDER — SENNOSIDES-DOCUSATE SODIUM 8.6-50 MG PO TABS: 1.0000 | ORAL_TABLET | Freq: Two times a day (BID) | ORAL | 0 refills | Status: DC

## 2021-04-06 MED ORDER — LORATADINE 10 MG PO TABS: 10.0000 mg | ORAL_TABLET | Freq: Every day | ORAL | 0 refills | Status: DC

## 2021-04-06 MED ORDER — POLYETHYLENE GLYCOL 3350 17 G PO PACK: 17.0000 g | PACK | Freq: Every day | ORAL | 0 refills | Status: DC | PRN

## 2021-04-06 MED ORDER — OXYCODONE HCL 5 MG PO TABS: 5.0000 mg | ORAL_TABLET | Freq: Four times a day (QID) | ORAL | 0 refills | Status: DC | PRN

## 2021-04-06 MED ORDER — IBUPROFEN 800 MG PO TABS: ORAL_TABLET | ORAL | 0 refills | Status: DC

## 2021-04-06 NOTE — Discharge Summary (Signed)
Physician Discharge Summary  Nicole Rush NGE:[id number] DOB: Dec 21, 1940 DOA: 03/21/2021  PCP: Lin Landsman, MD  Admit date: 03/21/2021 Discharge date: 04/06/2021  Admitted From: Home Disposition: Home.  Patient refused SNF placement  Recommendations for Outpatient Follow-up:  Follow up with PCP in 1 week with repeat CBC/BMP Outpatient follow-up with cardiology and cardiothoracic surgery Follow up in ED if symptoms worsen or new appear   Home Health: Home with PT/OT Equipment/Devices: None  Discharge Condition: Stable CODE STATUS: Full Diet recommendation: Heart healthy  Brief/Interim Summary: 80 year old female with history of asthma, cancer, hypertension, CVA unspecified and recent flu infection presented with 1 to 2 weeks of chest pain with dyspnea, nausea and admitted with pericarditis with trivial pericardial effusion and 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  Cardiology was consulted.  TTE showed EF of 65 to 70% with moderate TVR and trivial pericardial effusion.  She was started on prednisone and colchicine for possible postviral pericarditis   She spiked a temperature on 03/26/2021.  Empirically started on Rocephin.  Infectious work-up unrevealing so far.  Afebrile since 03/29/2021 but persistent leukocytosis.  Patient with intermittent chest pain.  Repeat TTE on 04/01/2021 showed large pericardial effusion with possible tamponade.  She underwent VATS and pericardial window on 04/02/2021.  Subsequently, patient's condition has improved.  Chest tube was removed on 04/05/2021 and TCTS has cleared the patient for discharge.  She is currently hemodynamically stable; on room air.  She will be discharged home with home health PT/OT.  She refused SNF placement.  Discharge Diagnoses:   Left-sided chest pain due to pericarditis/large pericardial effusion with cardiac tamponade -Pericarditis was felt to be postviral from influenza A infection.  TTE as above. -Status post VATS and pericardial window by CVTS  on 04/02/2021.  JP drain removed on 04/04/2021 by CVTS.  Chest tube was removed on 04/05/2021 and TCTS has cleared the patient for discharge.  Outpatient follow-up with cardiothoracic surgery -Continue colchicine once a day for 3 months as per cardiology.  Prednisone discontinued by cardiology on 04/03/2021.  Ibuprofen to be continued 800 mg ibuprofen 3 times daily for 10 days followed by 800 mg twice daily for 10 days followed by 800 mg daily for 10 days as per cardiology recommendations.  Outpatient follow-up with cardiology.  Cardiology has signed off.   Influenza A -Initially diagnosed on 03/17/2021.  Completed course of Tamiflu.  Currently on room air.   Fever -Possibly from pericarditis.  Infectious work-up negative so far with negative blood cultures.  No further fever since 03/29/2021. -Empirically started on IV Rocephin: Completed 5-day course of antibiotic therapy  Acute respiratory failure with hypoxia -Possibly from influenza. -Currently on room air. -PCCM recommended outpatient follow-up for calcified granuloma in the left lung   Elevated troponin -Felt to be due to pericarditis.  Outpatient follow-up with cardiology   lactic acidosis -Likely due to poor perfusion in the setting of tamponade and pericardial effusion. -Improved   Leukocytosis -Resolved   Microcytic anemia -Anemia panel consistent with anemia of chronic disease.  Hemoglobin stable.   Essential hypertension -Blood pressure on the lower side.  Outpatient follow-up.  Hyponatremia -Resolved.  Elevated LFTs -Questionable cause.  Improved.     Generalized weakness/physical deconditioning -Will need home health PT/OT.  Patient refused SNF placement -Currently remains full code.  Palliative care evaluated the patient.  Recommend outpatient palliative care follow-up  Severe malnutrition -Follow nutrition recommendations  Discharge Instructions  Discharge Instructions     Ambulatory referral to  Cardiology   Complete by: As directed  Diet - low sodium heart healthy   Complete by: As directed    Increase activity slowly   Complete by: As directed    No wound care   Complete by: As directed       Allergies as of 04/06/2021   No Known Allergies      Medication List     STOP taking these medications    oseltamivir 75 MG capsule Commonly known as: TAMIFLU       TAKE these medications    acetaminophen 500 MG tablet Commonly known as: TYLENOL Take 500 mg by mouth every 6 (six) hours as needed for mild pain, fever or headache.   anastrozole 1 MG tablet Commonly known as: ARIMIDEX Take 1 tablet (1 mg total) by mouth daily.   ibuprofen 800 MG tablet Commonly known as: ADVIL 800 mg 3 times a day for 10 days then 2 times a day for 10 days then once a day for 10 days   loratadine 10 MG tablet Commonly known as: CLARITIN Take 1 tablet (10 mg total) by mouth daily.   Mitigare 0.6 MG Caps Generic drug: Colchicine Take 0.6 mg by mouth daily at 6 (six) AM. Start taking on: April 07, 2021   multivitamin with minerals Tabs tablet Take 1 tablet by mouth daily.   Myrbetriq 25 MG Tb24 tablet Generic drug: mirabegron ER Take 25 mg by mouth daily.   oxyCODONE 5 MG immediate release tablet Commonly known as: Oxy IR/ROXICODONE Take 1 tablet (5 mg total) by mouth every 6 (six) hours as needed for severe pain.   pantoprazole 40 MG tablet Commonly known as: PROTONIX Take 1 tablet (40 mg total) by mouth daily. Start taking on: April 07, 2021   polyethylene glycol 17 g packet Commonly known as: MIRALAX / GLYCOLAX Take 17 g by mouth daily as needed.   senna-docusate 8.6-50 MG tablet Commonly known as: Senokot-S Take 1 tablet by mouth 2 (two) times daily.   VICKS DAYQUIL COLD & FLU PO Take 1 Dose by mouth 3 (three) times daily as needed (cough/fever/cold symptoms).               Durable Medical Equipment  (From admission, onward)            Start     Ordered   04/03/21 1517  For home use only DME lightweight manual wheelchair with seat cushion  Once       Comments: Patient suffers from weakness which impairs their ability to perform daily activities like bathing, dressing, grooming, and toileting in the home.  A cane or crutch will not resolve  issue with performing activities of daily living. A wheelchair will allow patient to safely perform daily activities. Patient is not able to propel themselves in the home using a standard weight wheelchair due to general weakness. Patient can self propel in the lightweight wheelchair. Length of need Lifetime. Accessories: elevating leg rests (ELRs), wheel locks, extensions and anti-tippers.   04/03/21 1517   03/25/21 1659  For home use only DME Hospital bed  Once       Question Answer Comment  Length of Need 12 Months   Patient has (list medical condition): pericarditis with pericardial effusion   The above medical condition requires: Patient requires the ability to reposition frequently   Head must be elevated greater than: 30 degrees   Bed type Semi-electric   Support Surface: Gel Overlay      03/25/21 1659   03/25/21 1630  For  home use only DME Walker rolling  Once       Question Answer Comment  Walker: With [place]   Patient needs a walker to treat with the following condition Weakness      03/25/21 1629   03/25/21 1630  For home use only DME Bedside commode  Once       Question:  Patient needs a bedside commode to treat with the following condition  Answer:  Weakness   03/25/21 1630            Follow-up Information     [facility] Follow up.   Specialty: Home Health Services Why: HHPT, [place] Contact information: [place] [address] [phone]         [facility] Follow up.   Why: rolling walker, bedside commode, hospital bed Contact information: [address] [place] [address] [phone]          Lajuana Matte, MD Follow up.   Specialty: Cardiothoracic Surgery Why: Please see discharge paperwork for appointment with surgeon. Contact information: [place] [address] [phone]         Lin Landsman, MD. Schedule an appointment as soon as possible for a visit in 1 week(s).   Specialty: Family Medicine Why: With repeat CBC/BMP Contact information: [place] [address] [phone]                No Known Allergies  Consultations: Cardiology/cardiothoracic surgery/palliative care   Procedures/Studies: DG Chest 1 View  Result Date: 04/05/2021 CLINICAL DATA:  Follow-up chest tube removal EXAM: PORTABLE CHEST 1 VIEW COMPARISON:  04/04/2021 FINDINGS: Right jugular central line remains in place. Cardiac shadow is stable. Right chest tube and mediastinal catheter have been removed in the interval. No recurrent pneumothorax is noted. Lungs are well aerated without focal infiltrate. Postsurgical changes in the left axilla are noted. No acute bony abnormality is seen. IMPRESSION: No recurrent pneumothorax following chest tube removal. Electronically Signed   By: Inez Catalina M.D.   On: 04/05/2021 17:48   DG Chest 2 View  Result Date: 04/06/2021 CLINICAL DATA:  79 year old female status post chest tube removal. EXAM: CHEST - 2 VIEW COMPARISON:  Chest x-ray 04/05/2021. FINDINGS: There is a right-sided internal jugular central venous catheter with tip terminating in the distal superior vena cava. Lung volumes are low. Skin fold artifact projecting over the lower right hemithorax. No acute consolidative airspace disease. Trace left pleural effusion. No definite right pleural effusion. No pneumothorax. No evidence of pulmonary edema. Heart size is normal. Upper mediastinal contours are within normal limits. Atherosclerotic calcifications in the thoracic aorta. Surgical clips project over the left breast and left axillary  region, likely from prior left lumpectomy and axillary lymph node dissection. IMPRESSION: 1. Support apparatus, as above. 2. Trace left pleural effusion. 3. Aortic atherosclerosis. Electronically Signed   By: Vinnie Langton M.D.   On: 04/06/2021 08:16   DG Chest 2 View  Result Date: 03/29/2021 CLINICAL DATA:  Chest pain EXAM: CHEST - 2 VIEW COMPARISON:  03/27/2021 FINDINGS: Transverse diameter of heart is increased. There are no signs of alveolar pulmonary edema. There is blunting of both lateral CP angles. There is no pneumothorax. Skin folds are overlying the lateral aspect of both lower lung fields. Surgical clips are seen in the left axilla. IMPRESSION: Cardiomegaly. There are no signs of pulmonary edema or focal pulmonary consolidation. There is blunting  of both lateral CP angles suggesting possible small effusions. Electronically Signed   By: Elmer Picker M.D.   On: 03/29/2021 12:50   DG Chest 2 View  Result Date: 03/21/2021 CLINICAL DATA:  Chest pain beginning last night EXAM: CHEST - 2 VIEW COMPARISON:  08/10/2012 FINDINGS: Mild cardiomegaly. Tortuous aorta. The patient has taken a poor inspiration. Allowing for that, the lungs are clear. The vascularity is normal. No effusions. Surgical clips in the left axilla. IMPRESSION: Poor inspiration. Allowing for that, no active disease is suspected. Electronically Signed   By: Nelson Chimes M.D.   On: 03/21/2021 09:59   CT CHEST ABDOMEN PELVIS W CONTRAST  Result Date: 03/21/2021 CLINICAL DATA:  Chest pain and shortness of breath. Suspect infection. EXAM: CT CHEST, ABDOMEN, AND PELVIS WITH CONTRAST TECHNIQUE: Multidetector CT imaging of the chest, abdomen and pelvis was performed following the standard protocol during bolus administration of intravenous contrast. CONTRAST:  49mL OMNIPAQUE IOHEXOL 350 MG/ML SOLN COMPARISON:  None. FINDINGS: CT CHEST FINDINGS Cardiovascular: Heart is mildly enlarged. Aorta is ectatic without focal dilatation. There  is a small pericardial effusion with some mild pericardial enhancement seen best on image 3/39. The main pulmonary artery is enlarged compatible with pulmonary artery hypertension. Mediastinum/Nodes: There is a 1 cm hypodense right thyroid nodule and a 5 mm hypodense left thyroid nodule. There are nonenlarged mediastinal lymph nodes diffusely. Esophagus is within normal limits. Lungs/Pleura: There are mild atelectatic changes in the lower lungs bilaterally. There is no focal lung infiltrate, pleural effusion or pneumothorax. There is a calcified granuloma in the left lung. Musculoskeletal: No chest wall mass or suspicious bone lesions identified. CT ABDOMEN PELVIS FINDINGS Hepatobiliary: No focal liver abnormality is seen. No gallstones, gallbladder wall thickening, or biliary dilatation. Pancreas: Unremarkable. No pancreatic ductal dilatation or surrounding inflammatory changes. Spleen: Normal in size without focal abnormality. Adrenals/Urinary Tract: There is a rounded hypodensity in the left kidney which is too small to characterize, most likely a cyst. The kidneys, adrenal glands, and bladder are otherwise within normal limits. Stomach/Bowel: Stomach is within normal limits. Appendix is not seen. No evidence of bowel wall thickening, distention, or inflammatory changes. There is sigmoid colon diverticulosis without evidence for acute diverticulitis. Vascular/Lymphatic: Aortic atherosclerosis. No enlarged abdominal or pelvic lymph nodes. Reproductive: Uterus and bilateral adnexa are unremarkable. Other: No abdominal wall hernia or abnormality. No abdominopelvic ascites. Musculoskeletal: Multilevel degenerative changes affect the spine IMPRESSION: 1. Small pericardial effusion with enhancement of the pericardium worrisome for pericarditis. 2. Mild cardiomegaly. Enlargement of the main pulmonary artery compatible with pulmonary artery hypertension. 3. No acute localizing process in the abdomen or pelvis. 4. Sigmoid  colon diverticulosis. 5. 1 cm incidental thyroid nodule. No follow-up imaging is recommended. Reference: J Am Coll Radiol. 2015 Feb;12(2): 143-50 6.  Aortic Atherosclerosis (ICD10-I70.0). Electronically Signed   By: Ronney Asters M.D.   On: 03/21/2021 21:54   DG CHEST PORT 1 VIEW  Result Date: 04/04/2021 CLINICAL DATA:  Chest tube present, pericardial effusion EXAM: PORTABLE CHEST 1 VIEW COMPARISON:  04/03/2021 FINDINGS: Unchanged AP portable examination. Cardiomegaly. Right neck vascular catheter. Right-sided chest and mediastinal drainage tubes. No pneumothorax. Trace bilateral pleural effusions and mild, diffuse interstitial pulmonary opacity. No new or focal airspace opacity. IMPRESSION: 1.  Unchanged AP portable examination. Cardiomegaly. 2. Right-sided chest and mediastinal drainage tubes. No pneumothorax. 3. Trace bilateral pleural effusions and mild, diffuse interstitial pulmonary opacity, consistent with edema. No new or focal airspace opacity. Electronically Signed   By: Delanna Ahmadi  M.D.   On: 04/04/2021 09:02   DG Chest Port 1 View  Result Date: 04/03/2021 CLINICAL DATA:  Chest tube.  Pericardial effusion. EXAM: PORTABLE CHEST 1 VIEW COMPARISON:  One-view chest x-ray 04/02/2021. FINDINGS: Marked is enlarged. Mediastinal drain and right-sided chest tube are stable. No pneumothorax is present. Small left pleural effusion and left basilar airspace disease remains. The lungs are otherwise clear. Surgical clips are present in the left axilla. IMPRESSION: 1. Stable cardiomegaly without failure. 2. Stable small left pleural effusion and left basilar airspace disease. 3. Stable right-sided chest tube without pneumothorax. Electronically Signed   By: San Morelle M.D.   On: 04/03/2021 09:28   DG Chest Port 1 View  Result Date: 04/02/2021 CLINICAL DATA:  Status post pericardial window with thoracotomy EXAM: PORTABLE CHEST 1 VIEW COMPARISON:  04/01/2021 FINDINGS: Unchanged mild cardiomegaly.  Minimal left basilar atelectasis. Lungs otherwise clear. No pulmonary vascular congestion. Interval placement of right IJ central venous catheter which terminates at the cavoatrial junction. Mediastinal drain is seen. There is a right chest tube also in place. No pneumothorax. IMPRESSION: Unchanged mild cardiomegaly and mild left basilar atelectasis. Electronically Signed   By: Miachel Roux M.D.   On: 04/02/2021 12:47   DG Chest Port 1 View  Result Date: 04/01/2021 CLINICAL DATA:  Chest pain EXAM: PORTABLE CHEST 1 VIEW COMPARISON:  Chest radiograph dated March 29, 2021 FINDINGS: The heart is enlarged. No focal consolidation or pleural effusion. Surgical clips in the left chest wall. No acute osseous abnormality. IMPRESSION: 1.  Stable cardiomegaly. 2.  No focal consolidation or pleural effusion. Electronically Signed   By: Keane Police D.O.   On: 04/01/2021 10:06   DG CHEST PORT 1 VIEW  Result Date: 03/27/2021 CLINICAL DATA:  Shortness of breath EXAM: PORTABLE CHEST 1 VIEW COMPARISON:  Previous studies including the examination of 03/26/2021 FINDINGS: Transverse diameter of heart is increased. There are no signs of alveolar pulmonary edema or new focal infiltrates. Small linear densities in the right parahilar region may suggest scarring or subsegmental atelectasis. There is minimal blunting left lateral CP angle. There is no pneumothorax. Surgical clips are seen in the left axilla. IMPRESSION: Cardiomegaly. There are no signs of pulmonary edema or new focal infiltrates. Electronically Signed   By: Elmer Picker M.D.   On: 03/27/2021 09:07   DG CHEST PORT 1 VIEW  Result Date: 03/26/2021 CLINICAL DATA:  Shortness of breath.  History of pericarditis. EXAM: PORTABLE CHEST 1 VIEW COMPARISON:  03/25/2021 FINDINGS: Enlarged cardiac silhouette as seen previously. Tortuous aorta. Lungs are clear. The vascularity is normal. No effusions. 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 IMPRESSION: No change. Enlarged  cardiac silhouette. Tortuous aorta. Lungs clear. Electronically Signed   By: Nelson Chimes M.D.   On: 03/26/2021 08:40   DG CHEST PORT 1 VIEW  Result Date: 03/25/2021 CLINICAL DATA:  Shortness of breath EXAM: PORTABLE CHEST 1 VIEW COMPARISON:  Previous studies including the examination of 03/24/2021 FINDINGS: Transverse diameter of heart is increased. Thoracic aorta is ectatic. Increased markings are seen in the lower lung fields. There are no signs of alveolar pulmonary edema. There is no new focal pulmonary consolidation. There is no significant pleural effusion or pneumothorax. IMPRESSION: Cardiomegaly. Linear densities in the lower lung fields may suggest scarring or subsegmental atelectasis. There are no signs of alveolar pulmonary edema or new focal pulmonary consolidation. Electronically Signed   By: Elmer Picker M.D.   On: 03/25/2021 08:30   DG CHEST PORT 1 VIEW  Result  Date: 03/24/2021 CLINICAL DATA:  Shortness of breath. EXAM: PORTABLE CHEST 1 VIEW COMPARISON:  03/23/2021 FINDINGS: 0545 hours. The cardio pericardial silhouette is enlarged. Streaky opacity at the lung bases suggest atelectasis. No overt edema or dense focal airspace consolidation. The visualized bony structures of the thorax show no acute abnormality. Gaseous bowel distention noted in the visualized upper abdomen. Telemetry leads overlie the chest. [place] IMPRESSION: Enlarged cardiopericardial silhouette with streaky bibasilar atelectasis. Electronically Signed   By: Misty Stanley M.D.   On: 03/24/2021 08:18   DG CHEST PORT 1 VIEW  Result Date: 03/23/2021 CLINICAL DATA:  Fever. EXAM: PORTABLE CHEST 1 VIEW COMPARISON:  03/21/2021 FINDINGS: 0519 hours. The cardio pericardial silhouette is enlarged. There is pulmonary vascular congestion without overt pulmonary edema. Interstitial markings are diffusely coarsened with chronic features. The visualized bony structures of the thorax show no acute abnormality. Telemetry leads  overlie the chest. IMPRESSION: Chronic interstitial lung disease. No acute cardiopulmonary findings. Electronically Signed   By: Misty Stanley M.D.   On: 03/23/2021 06:58   ECHOCARDIOGRAM COMPLETE  Result Date: 03/24/2021    ECHOCARDIOGRAM REPORT   Patient Name:   Nicole Rush Date of Exam: 03/24/2021 Medical Rec #:  [id number]          Height:       61.0 in Accession #:    [id number]         Weight:       95.5 lb Date of Birth:  14-Mar-1941           BSA:          1.379 m Patient Age:    87 years           BP:           119/75 mmHg Patient Gender: F                  HR:           91 bpm. Exam Location:  Inpatient Procedure: 2D Echo, Cardiac Doppler and Color Doppler Indications:    Pericardial effusion I31.3  History:        Patient has no prior history of Echocardiogram examinations.                 Stroke; Risk Factors:Hypertension.  Sonographer:    Bernadene Person RDCS Referring Phys: [id number] [place]  1. Left ventricular ejection fraction, by estimation, is 65 to 70%. The left ventricle has normal function. The left ventricle has no regional wall motion abnormalities. Left ventricular diastolic parameters are indeterminate. Elevated left ventricular end-diastolic pressure.  2. Right ventricular systolic function is normal. The right ventricular size is normal. There is normal pulmonary artery systolic pressure.  3. The mitral valve is normal in structure. Trivial mitral valve regurgitation.  4. Tricuspid valve regurgitation is moderate.  5. The aortic valve is tricuspid. Aortic valve regurgitation is not visualized. No aortic stenosis is present. FINDINGS  Left Ventricle: Left ventricular ejection fraction, by estimation, is 65 to 70%. The left ventricle has normal function. The left ventricle has no regional wall motion abnormalities. The left ventricular internal cavity size was normal in size. There is  no left ventricular hypertrophy. Left ventricular diastolic parameters are  indeterminate. Elevated left ventricular end-diastolic pressure. Right Ventricle: The right ventricular size is normal. Right vetricular wall thickness was not well visualized. Right ventricular systolic function is normal. There is normal pulmonary artery systolic pressure. The tricuspid regurgitant velocity is 2.70  m/s, and with an assumed right atrial pressure of 3 mmHg, the estimated right ventricular systolic pressure is 91.6 mmHg. Left Atrium: Left atrial size was normal in size. Right Atrium: Right atrial size was normal in size. Pericardium: Trivial pericardial effusion is present. Mitral Valve: The mitral valve is normal in structure. Trivial mitral valve regurgitation. Tricuspid Valve: The tricuspid valve is grossly normal. Tricuspid valve regurgitation is moderate. Aortic Valve: The aortic valve is tricuspid. Aortic valve regurgitation is not visualized. No aortic stenosis is present. Pulmonic Valve: The pulmonic valve was grossly normal. Pulmonic valve regurgitation is not visualized. Aorta: The aortic root and ascending aorta are structurally normal, with no evidence of dilitation. IAS/Shunts: The interatrial septum was not well visualized.  LEFT VENTRICLE PLAX 2D LVIDd:         3.60 cm   Diastology LVIDs:         2.30 cm   LV e' medial:    4.58 cm/s LV PW:         1.00 cm   LV E/e' medial:  15.6 LV IVS:        0.90 cm   LV e' lateral:   5.18 cm/s LVOT diam:     2.10 cm   LV E/e' lateral: 13.8 LV SV:         59 LV SV Index:   43 LVOT Area:     3.46 cm  RIGHT VENTRICLE RV S prime:     7.58 cm/s TAPSE (M-mode): 1.4 cm LEFT ATRIUM             Index        RIGHT ATRIUM           Index LA diam:        3.40 cm 2.47 cm/m   RA Area:     12.80 cm LA Vol (A2C):   45.9 ml 33.28 ml/m  RA Volume:   23.80 ml  17.26 ml/m LA Vol (A4C):   42.8 ml 31.03 ml/m LA Biplane Vol: 45.0 ml 32.63 ml/m  AORTIC VALVE LVOT Vmax:   103.00 cm/s LVOT Vmean:  67.300 cm/s LVOT VTI:    0.170 m  AORTA Ao Root diam: 3.00 cm Ao Asc  diam:  3.40 cm MITRAL VALVE               TRICUSPID VALVE MV Area (PHT): 4.71 cm    TR Peak grad:   29.2 mmHg MV Decel Time: 161 msec    TR Vmax:        270.00 cm/s MV E velocity: 71.60 cm/s MV A velocity: 60.40 cm/s  SHUNTS MV E/A ratio:  1.19        Systemic VTI:  0.17 m                            Systemic Diam: 2.10 cm Mertie Moores MD Electronically signed by Mertie Moores MD Signature Date/Time: 03/24/2021/12:29:49 PM    Final    ECHOCARDIOGRAM LIMITED  Result Date: 04/03/2021    ECHOCARDIOGRAM LIMITED REPORT   Patient Name:   Nicole Rush Date of Exam: 04/03/2021 Medical Rec #:  [id number]          Height:       61.0 in Accession #:    [id number]         Weight:       95.5 lb Date of Birth:  [birth year]  BSA:          1.379 m Patient Age:    68 years           BP:           91/58 mmHg Patient Gender: F                  HR:           92 bpm. Exam Location:  Inpatient Procedure: Cardiac Doppler, Color Doppler and Limited Echo Indications:    f/u pericardial effusion  History:        Patient has prior history of Echocardiogram examinations, most                 recent 04/01/2021. Pericarditis, Signs/Symptoms:Chest Pain; Risk                 Factors:Hypertension.  Sonographer:    Glo Herring Referring Phys: [id number] [place]  1. There is a small pericaridal effusion, greatest anterior to the RV. Respirophasic variation of the mitral inflow is < 30%. IVC is not dilated. There is not evidence of RV or RA collapse.  2. Left ventricular ejection fraction, by estimation, is 70 to 75%. The left ventricle has hyperdynamic function. The left ventricle has no regional wall motion abnormalities.  3. Right ventricular systolic function is normal. The right ventricular size is normal.  4. A small pericardial effusion is present. The pericardial effusion is circumferential.  5. The mitral valve is normal in structure.  6. The aortic valve is tricuspid.  7. The inferior vena cava is  normal in size with greater than 50% respiratory variability, suggesting right atrial pressure of 3 mmHg. Comparison(s): A prior study was performed on 04/01/21. Prior images reviewed side by side. Pericardial effusion has decreased from 04/01/21. There is no evidence of increased pericardial pressure in this study. FINDINGS  Left Ventricle: Left ventricular ejection fraction, by estimation, is 70 to 75%. The left ventricle has hyperdynamic function. The left ventricle has no regional wall motion abnormalities. Right Ventricle: The right ventricular size is normal. Right ventricular systolic function is normal. Pericardium: A small pericardial effusion is present. The pericardial effusion is circumferential. Mitral Valve: The mitral valve is normal in structure. Tricuspid Valve: The tricuspid valve is normal in structure. Tricuspid valve regurgitation is mild. Aortic Valve: The aortic valve is tricuspid. Venous: The inferior vena cava is normal in size with greater than 50% respiratory variability, suggesting right atrial pressure of 3 mmHg. IVC IVC diam: 1.60 cm Rudean Haskell MD Electronically signed by Rudean Haskell MD Signature Date/Time: 04/03/2021/5:13:06 PM    Final    ECHOCARDIOGRAM LIMITED  Result Date: 04/01/2021    ECHOCARDIOGRAM LIMITED REPORT   Patient Name:   Nicole Rush Date of Exam: 04/01/2021 Medical Rec #:  [id number]          Height:       61.0 in Accession #:    [id number]         Weight:       95.5 lb Date of Birth:  12-20-1940           BSA:          1.379 m Patient Age:    32 years           BP:           118/82 mmHg Patient Gender: F  HR:           106 bpm. Exam Location:  Inpatient Procedure: Limited Echo and Limited Color Doppler Indications:    Chest Pain  History:        Patient has prior history of Echocardiogram examinations, most                 recent 03/24/2021. Risk Factors:Dyslipidemia. Breast CA.  Sonographer:    Wenda Low Referring  Phys: [id number] TAYE T GONFA IMPRESSIONS  1. There is a large pericardial effusion locately predominately over the anterior aspect of the RV (up to 1.6 cm). It measures posterior to the LV (0.9 cm). This is new from the prior echo. There is subtle RV collapse in diastole. Poor PW tracing of MV inflow. The IVC is dilated ~2.1 cm with >50 collapse. Hepatic vein PW is suggestive of late diastolic reversal with inspiration. The findings could represent early tamponade physiology especially given the change in size of the effusion. Clinical correlation is recommended. Large pericardial effusion. The pericardial effusion is anterior to the right ventricle.  2. Left ventricular ejection fraction, by estimation, is 70 to 75%. The left ventricle has hyperdynamic function. The left ventricle has no regional wall motion abnormalities. There is mild concentric left ventricular hypertrophy.  3. Right ventricular systolic function is normal. The right ventricular size is normal.  4. The aortic valve is tricuspid. Comparison(s): Changes from prior study are noted. Effusion is larger compared with prior. FINDINGS  Left Ventricle: Left ventricular ejection fraction, by estimation, is 70 to 75%. The left ventricle has hyperdynamic function. The left ventricle has no regional wall motion abnormalities. The left ventricular internal cavity size was small. There is mild concentric left ventricular hypertrophy. Right Ventricle: The right ventricular size is normal. No increase in right ventricular wall thickness. Right ventricular systolic function is normal. Left Atrium: Left atrial size was normal in size. Right Atrium: Right atrial size was normal in size. Pericardium: There is a large pericardial effusion locately predominately over the anterior aspect of the RV (up to 1.6 cm). It measures posterior to the LV (0.9 cm). This is new from the prior echo. There is subtle RV collapse in diastole. Poor PW tracing of MV inflow. The IVC is  dilated ~2.1 cm with >50 collapse. Hepatic vein PW is suggestive of late diastolic reversal with inspiration. The findings could represent early tamponade physiology especially given the change in size of the effusion. Clinical correlation is recommended. A large pericardial effusion is present. The pericardial effusion is anterior to the right ventricle. Aortic Valve: The aortic valve is tricuspid. Pulmonic Valve: The pulmonic valve was grossly normal. Aorta: The aortic root is normal in size and structure. IAS/Shunts: The atrial septum is grossly normal. LEFT VENTRICLE PLAX 2D LVIDd:         2.60 cm LVIDs:         1.40 cm LV PW:         1.20 cm LV IVS:        1.20 cm LVOT diam:     1.90 cm LVOT Area:     2.84 cm  LEFT ATRIUM             Index LA diam:        2.50 cm 1.81 cm/m LA Vol (A2C):   55.3 ml 40.09 ml/m LA Vol (A4C):   23.3 ml 16.89 ml/m LA Biplane Vol: 38.5 ml 27.91 ml/m   AORTA Ao Root diam: 2.70 cm  SHUNTS Systemic Diam: 1.90 cm Eleonore Chiquito MD Electronically signed by Eleonore Chiquito MD Signature Date/Time: 04/01/2021/4:06:42 PM    Final       Subjective: Patient seen and examined at bedside.  Denies worsening shortness of breath, nausea, vomiting or chest pain.  Feels okay to go home today.  Discharge Exam: Vitals:   04/06/21 0309 04/06/21 0800  BP: 125/77 113/65  Pulse: 86 89  Resp: 15 20  Temp: 98.6 F (37 C) 98.6 F (37 C)  SpO2: 98% 98%    General: Pt is alert, awake, not in acute distress.  Currently on room air. Cardiovascular: rate controlled, S1/S2 + Respiratory: bilateral decreased breath sounds at bases Abdominal: Soft, NT, ND, bowel sounds + Extremities: Trace lower extremity edema.  No cyanosis    The results of significant diagnostics from this hospitalization (including imaging, microbiology, ancillary and laboratory) are listed below for reference.     Microbiology: Recent Results (from the past 240 hour(s))  Surgical pcr screen     Status: None    Collection Time: 04/02/21  5:57 AM   Specimen: Nasal Mucosa; Nasal Swab  Result Value Ref Range Status   MRSA, PCR NEGATIVE NEGATIVE Final   Staphylococcus aureus NEGATIVE NEGATIVE Final    Comment: (NOTE) The Xpert SA Assay (FDA approved for NASAL specimens in patients 74 years of age and older), is one component of a comprehensive surveillance program. It is not intended to diagnose infection nor to guide or monitor treatment. Performed at [facility], [place] [address], [place], [place] [address]   Aerobic/Anaerobic Culture w Gram Stain (surgical/deep wound)     Status: None (Preliminary result)   Collection Time: 04/02/21  7:14 AM   Specimen: PATH Cytology Misc. fluid; Body Fluid  Result Value Ref Range Status   Specimen Description FLUID  Final   Special Requests PERICARDIAL FLUID SPEC A  Final   Gram Stain   Final    FEW WBC PRESENT,BOTH PMN AND MONONUCLEAR NO ORGANISMS SEEN    Culture   Final    NO GROWTH 3 DAYS NO ANAEROBES ISOLATED; CULTURE IN PROGRESS FOR 5 DAYS Performed at [facility], [place] [address] [place], [place] [address]    Report Status PENDING  Incomplete  Fungus Stain     Status: None   Collection Time: 04/02/21  7:14 AM   Specimen: PATH Cytology Misc. fluid; Body Fluid  Result Value Ref Range Status   FUNGUS STAIN Final report  Final    Comment: (NOTE) Performed At: [facility] [address] [place], [place] [id number] Rush Farmer MD [id number]    Fungal Source FLUID  Final    Comment: Performed at [facility], [place] [address], [place], [place] [address]  Acid Fast Smear (AFB)     Status: None   Collection Time: 04/02/21  7:14 AM   Specimen: PATH Cytology Misc. fluid; Body Fluid  Result Value Ref Range Status   AFB Specimen Processing Concentration  Final   Acid Fast Smear Negative  Final    Comment: (NOTE) Performed At: [facility] [place], [place] [id number] Rush Farmer MD  BJ:[id number]    Source (AFB) PERICARDIAL FLUID SPEC A  Final    Comment: Performed at [facility], [place] [address] [place], [place] [address]  Fungal Stain reflex     Status: None   Collection Time: 04/02/21  7:14 AM  Result Value Ref Range Status   Fungal  stain result 1 Comment  Final    Comment: (NOTE) KOH/Calcofluor preparation:  no fungus observed. Performed At: [facility] [place], [place] [id number] Rush Farmer MD EL:[id number]      Labs: BNP (last 3 results) Recent Labs    04/01/21 0909  BNP 585.2*   Basic Metabolic Panel: Recent Labs  Lab 03/31/21 0053 04/02/21 0037 04/03/21 0645 04/04/21 0411 04/05/21 0429 04/06/21 0339  NA 135 133* 132* 135 138 139  K 4.2 4.4 3.7 3.9 3.7 3.5  CL 102 101 100 107 107 110  CO2 21* 20* 23 21* 24 24  GLUCOSE 118* 118* 102* 93 89 88  BUN 25* 23 20 28* 27* 24*  CREATININE 0.64 0.81 0.68 0.73 0.70 0.53  CALCIUM 8.1* 8.5* 8.3* 8.2* 7.9* 7.7*  MG 1.9 1.8  --  1.8 1.7 1.7  PHOS 2.2* 3.3  --   --   --   --    Liver Function Tests: Recent Labs  Lab 03/31/21 0053 04/02/21 0037 04/03/21 0645 04/04/21 0411  AST 50* 42* 25 21  ALT 46* 50* 31 26  ALKPHOS 84 90 72 85  BILITOT 0.5 0.9 0.6 0.5  PROT 6.3* 6.8 5.8* 5.9*  ALBUMIN 1.8*   1.9* 2.2* 1.8* 1.7*   No results for input(s): LIPASE, AMYLASE in the last 168 hours. No results for input(s): AMMONIA in the last 168 hours. CBC: Recent Labs  Lab 04/02/21 0037 04/03/21 0645 04/04/21 0411 04/05/21 0429 04/06/21 0339  WBC 21.3* 16.9* 13.7* 9.2 7.2  NEUTROABS 18.2*  --   --  7.4 5.2  HGB 11.4* 9.1* 8.4* 8.4* 8.1*  HCT 34.1* 26.4* 25.3* 24.5* 23.7*  MCV 75.3* 73.5* 76.2* 74.9* 74.8*  PLT 522* PLATELET CLUMPS NOTED ON SMEAR, UNABLE TO ESTIMATE 399 437* 425*   Cardiac Enzymes: No results for input(s): CKTOTAL, CKMB, CKMBINDEX, TROPONINI in the last 168 hours. BNP: Invalid input(s): POCBNP CBG: No results for input(s): GLUCAP in the last  168 hours. D-Dimer No results for input(s): DDIMER in the last 72 hours. Hgb A1c No results for input(s): HGBA1C in the last 72 hours. Lipid Profile No results for input(s): CHOL, HDL, LDLCALC, TRIG, CHOLHDL, LDLDIRECT in the last 72 hours. Thyroid function studies Recent Labs    04/04/21 0411  TSH 4.732*   Anemia work up No results for input(s): VITAMINB12, FOLATE, FERRITIN, TIBC, IRON, RETICCTPCT in the last 72 hours. Urinalysis    Component Value Date/Time   COLORURINE ORANGE (A) 03/26/2021 0816   APPEARANCEUR CLEAR 03/26/2021 0816   LABSPEC 1.025 03/26/2021 0816   PHURINE 6.0 03/26/2021 0816   GLUCOSEU NEGATIVE 03/26/2021 0816   HGBUR SMALL (A) 03/26/2021 0816   BILIRUBINUR NEGATIVE 03/26/2021 0816   KETONESUR NEGATIVE 03/26/2021 0816   PROTEINUR 30 (A) 03/26/2021 0816   NITRITE NEGATIVE 03/26/2021 0816   LEUKOCYTESUR NEGATIVE 03/26/2021 0816   Sepsis Labs Invalid input(s): PROCALCITONIN,  WBC,  LACTICIDVEN Microbiology Recent Results (from the past 240 hour(s))  Surgical pcr screen     Status: None   Collection Time: 04/02/21  5:57 AM   Specimen: Nasal Mucosa; Nasal Swab  Result Value Ref Range Status   MRSA, PCR NEGATIVE NEGATIVE Final   Staphylococcus aureus NEGATIVE NEGATIVE Final    Comment: (NOTE) The Xpert SA Assay (FDA approved for NASAL specimens in patients 61 years of age and older), is one component of a comprehensive surveillance program. It is not intended to diagnose infection nor to guide or monitor treatment. Performed  at [facility], [place] [address] [place], [place] [address]   Aerobic/Anaerobic Culture w Gram Stain (surgical/deep wound)     Status: None (Preliminary result)   Collection Time: 04/02/21  7:14 AM   Specimen: PATH Cytology Misc. fluid; Body Fluid  Result Value Ref Range Status   Specimen Description FLUID  Final   Special Requests PERICARDIAL FLUID SPEC A  Final   Gram Stain   Final    FEW WBC PRESENT,BOTH PMN AND  MONONUCLEAR NO ORGANISMS SEEN    Culture   Final    NO GROWTH 3 DAYS NO ANAEROBES ISOLATED; CULTURE IN PROGRESS FOR 5 DAYS Performed at [facility], [place] [address] [place], [place] [address]    Report Status PENDING  Incomplete  Fungus Stain     Status: None   Collection Time: 04/02/21  7:14 AM   Specimen: PATH Cytology Misc. fluid; Body Fluid  Result Value Ref Range Status   FUNGUS STAIN Final report  Final    Comment: (NOTE) Performed At: [facility] [address] [place], [place] [id number] Rush Farmer MD [id number]    Fungal Source FLUID  Final    Comment: Performed at [facility], [place] [address] [place], [place] [address]  Acid Fast Smear (AFB)     Status: None   Collection Time: 04/02/21  7:14 AM   Specimen: PATH Cytology Misc. fluid; Body Fluid  Result Value Ref Range Status   AFB Specimen Processing Concentration  Final   Acid Fast Smear Negative  Final    Comment: (NOTE) Performed At: [facility] [place], [place] [id number] Rush Farmer MD [id number]    Source (AFB) PERICARDIAL FLUID SPEC A  Final    Comment: Performed at [facility], [place] [address], [place], [place] [address]  Fungal Stain reflex     Status: None   Collection Time: 04/02/21  7:14 AM  Result Value Ref Range Status   Fungal stain result 1 Comment  Final    Comment: (NOTE) KOH/Calcofluor preparation:  no fungus observed. Performed At: [facility] [place], [place] [id number] Rush Farmer MD WS:[id number]      Time coordinating discharge: 35 minutes  SIGNED:   Aline August, MD  [facility] 04/06/2021, 10:33 AM

## 2021-04-06 NOTE — Plan of Care (Signed)
Problem: Acute Rehab PT Goals(only PT should resolve) Goal: Pt Will Go Supine/Side To Sit Outcome: Completed/Met Goal: Patient Will Transfer Sit To/From Stand Outcome: Completed/Met Goal: Pt Will Ambulate Outcome: Completed/Met Goal: Pt Will Go Up/Down Stairs Outcome: Completed/Met Goal: Pt/caregiver will Perform Home Exercise Program Outcome: Completed/Met   Problem: Education: Goal: Knowledge of General Education information will improve Description: Including pain rating scale, medication(s)/side effects and non-pharmacologic comfort measures Outcome: Completed/Met   Problem: Health Behavior/Discharge Planning: Goal: Ability to manage health-related needs will improve Outcome: Completed/Met   Problem: Clinical Measurements: Goal: Ability to maintain clinical measurements within normal limits will improve Outcome: Completed/Met Goal: Will remain free from infection Outcome: Completed/Met Goal: Diagnostic test results will improve Outcome: Completed/Met Goal: Respiratory complications will improve Outcome: Completed/Met Goal: Cardiovascular complication will be avoided Outcome: Completed/Met   Problem: Activity: Goal: Risk for activity intolerance will decrease Outcome: Completed/Met   Problem: Nutrition: Goal: Adequate nutrition will be maintained Outcome: Completed/Met   Problem: Coping: Goal: Level of anxiety will decrease Outcome: Completed/Met   Problem: Elimination: Goal: Will not experience complications related to bowel motility Outcome: Completed/Met Goal: Will not experience complications related to urinary retention Outcome: Completed/Met   Problem: Pain Managment: Goal: General experience of comfort will improve Outcome: Completed/Met   Problem: Safety: Goal: Ability to remain free from injury will improve Outcome: Completed/Met   Problem: Skin Integrity: Goal: Risk for impaired skin integrity will decrease Outcome: Completed/Met    Problem: Acute Rehab OT Goals (only OT should resolve) Goal: Pt. Will Perform Grooming Outcome: Completed/Met Goal: Pt. Will Perform Upper Body Bathing Outcome: Completed/Met Goal: Pt. Will Perform Lower Body Bathing Outcome: Completed/Met Goal: Pt. Will Perform Upper Body Dressing Outcome: Completed/Met Goal: Pt. Will Perform Lower Body Dressing Outcome: Completed/Met Goal: Pt. Will Transfer To Toilet Outcome: Completed/Met Goal: OT Additional ADL Goal #1 Outcome: Completed/Met Goal: OT Additional ADL Goal #2 Outcome: Completed/Met   Problem: Malnutrition  (NI-5.2) Goal: Food and/or nutrient delivery Description: Individualized approach for food/nutrient provision. Outcome: Completed/Met

## 2021-04-06 NOTE — Progress Notes (Signed)
Mobility Specialist Progress Note:   04/06/21 1025  Mobility  Activity Ambulated in hall  Level of Assistance Contact guard assist, steadying assist  Assistive Device Front wheel walker  Distance Ambulated (ft) 125 ft  Mobility Ambulated with assistance in hallway  Mobility Response Tolerated well  Mobility performed by Mobility specialist  $Mobility charge 1 Mobility   Pt received in bed willing to participate in mobility. Complaints of R shoulder pain otherwise asymptomatic. Pt returned to bed with call bell in reach and all needs met.   [facility] Public librarian Phone [phone] Secondary Phone [phone]

## 2021-04-06 NOTE — TOC Transition Note (Signed)
Transition of Care [facility]) - CM/SW Discharge Note   Patient Details  Name: Nicole Rush MRN: [id number] Date of Birth: 1940/05/19  Transition of [facility]) CM/SW Contact:  Bartholomew Crews, RN Phone Number: [phone] 04/06/2021, 1:15 PM   Clinical Narrative:     Patient to transition home today. Notified Cory at [place].   Final next level of care: [place] Barriers to Discharge: No Barriers Identified   Patient Goals and CMS Choice Patient states their goals for this hospitalization and ongoing recovery are:: return home CMS Medicare.gov Compare Post Acute Care list provided to:: Patient Represenative (must comment) Choice offered to / list presented to : Adult Children  Discharge Placement                       Discharge Plan and Services                DME Arranged: Bedside commode, Walker rolling DME Agency: [employer] Date DME Agency Contacted: 03/25/21 Time DME Agency Contacted: [phone] Representative spoke with at DME Agency: [place]: PT, OT [place] Agency: [place] Date [place]: 04/06/21 Time [place]: 4825 Representative spoke with at [place]: [place] ([place]) Interventions     Readmission Risk Interventions No flowsheet data found.

## 2021-04-07 LAB — AEROBIC/ANAEROBIC CULTURE W GRAM STAIN (SURGICAL/DEEP WOUND): Culture: NO GROWTH

## 2021-04-12 ENCOUNTER — Encounter: Payer: Self-pay | Admitting: Thoracic Surgery (Cardiothoracic Vascular Surgery)

## 2021-04-17 ENCOUNTER — Inpatient Hospital Stay (HOSPITAL_COMMUNITY): Payer: Medicare HMO

## 2021-04-17 ENCOUNTER — Inpatient Hospital Stay (HOSPITAL_COMMUNITY)
Admission: EM | Admit: 2021-04-17 | Discharge: 2021-04-20 | DRG: 640 | Disposition: A | Discharge status: Home / Self Care | Payer: Medicare HMO | Attending: Internal Medicine | Admitting: Internal Medicine

## 2021-04-17 ENCOUNTER — Emergency Department (HOSPITAL_COMMUNITY): Payer: Medicare HMO

## 2021-04-17 ENCOUNTER — Encounter (HOSPITAL_COMMUNITY): Payer: Self-pay | Admitting: Emergency Medicine

## 2021-04-17 ENCOUNTER — Other Ambulatory Visit: Payer: Self-pay

## 2021-04-17 DIAGNOSIS — Z8679 Personal history of other diseases of the circulatory system: Secondary | ICD-10-CM

## 2021-04-17 DIAGNOSIS — Z9012 Acquired absence of left breast and nipple: Secondary | ICD-10-CM

## 2021-04-17 DIAGNOSIS — K219 Gastro-esophageal reflux disease without esophagitis: Secondary | ICD-10-CM | POA: Diagnosis present

## 2021-04-17 DIAGNOSIS — Z801 Family history of malignant neoplasm of trachea, bronchus and lung: Secondary | ICD-10-CM

## 2021-04-17 DIAGNOSIS — Z79811 Long term (current) use of aromatase inhibitors: Secondary | ICD-10-CM | POA: Diagnosis not present

## 2021-04-17 DIAGNOSIS — E46 Unspecified protein-calorie malnutrition: Secondary | ICD-10-CM | POA: Diagnosis present

## 2021-04-17 DIAGNOSIS — Z853 Personal history of malignant neoplasm of breast: Secondary | ICD-10-CM | POA: Diagnosis not present

## 2021-04-17 DIAGNOSIS — E876 Hypokalemia: Secondary | ICD-10-CM | POA: Diagnosis present

## 2021-04-17 DIAGNOSIS — U071 COVID-19: Secondary | ICD-10-CM | POA: Diagnosis present

## 2021-04-17 DIAGNOSIS — Z79899 Other long term (current) drug therapy: Secondary | ICD-10-CM

## 2021-04-17 DIAGNOSIS — N390 Urinary tract infection, site not specified: Secondary | ICD-10-CM | POA: Diagnosis present

## 2021-04-17 DIAGNOSIS — Z803 Family history of malignant neoplasm of breast: Secondary | ICD-10-CM | POA: Diagnosis not present

## 2021-04-17 DIAGNOSIS — D509 Iron deficiency anemia, unspecified: Secondary | ICD-10-CM | POA: Diagnosis present

## 2021-04-17 DIAGNOSIS — E860 Dehydration: Principal | ICD-10-CM | POA: Diagnosis present

## 2021-04-17 DIAGNOSIS — I951 Orthostatic hypotension: Secondary | ICD-10-CM | POA: Diagnosis present

## 2021-04-17 DIAGNOSIS — E871 Hypo-osmolality and hyponatremia: Secondary | ICD-10-CM | POA: Diagnosis present

## 2021-04-17 DIAGNOSIS — Z87891 Personal history of nicotine dependence: Secondary | ICD-10-CM

## 2021-04-17 DIAGNOSIS — I10 Essential (primary) hypertension: Secondary | ICD-10-CM | POA: Diagnosis present

## 2021-04-17 DIAGNOSIS — R7989 Other specified abnormal findings of blood chemistry: Secondary | ICD-10-CM

## 2021-04-17 DIAGNOSIS — R55 Syncope and collapse: Secondary | ICD-10-CM | POA: Diagnosis present

## 2021-04-17 DIAGNOSIS — Z6828 Body mass index (BMI) 28.0-28.9, adult: Secondary | ICD-10-CM

## 2021-04-17 DIAGNOSIS — Z8673 Personal history of transient ischemic attack (TIA), and cerebral infarction without residual deficits: Secondary | ICD-10-CM

## 2021-04-17 DIAGNOSIS — N179 Acute kidney failure, unspecified: Secondary | ICD-10-CM | POA: Diagnosis present

## 2021-04-17 LAB — C-REACTIVE PROTEIN: CRP: 4.3 mg/dL — ABNORMAL HIGH (ref <1.0)

## 2021-04-17 LAB — CBC WITH DIFFERENTIAL/PLATELET
Abs Immature Granulocytes: 0.04 10*3/uL (ref 0.00–0.07)
Basophils Absolute: 0 10*3/uL (ref 0.0–0.1)
Basophils Relative: 0 %
Eosinophils Absolute: 0.3 10*3/uL (ref 0.0–0.5)
Eosinophils Relative: 4 %
HCT: 31.5 % — ABNORMAL LOW (ref 36.0–46.0)
Hemoglobin: 10.6 g/dL — ABNORMAL LOW (ref 12.0–15.0)
Immature Granulocytes: 1 %
Lymphocytes Relative: 13 %
Lymphs Abs: 1 10*3/uL (ref 0.7–4.0)
MCH: 25.5 pg — ABNORMAL LOW (ref 26.0–34.0)
MCHC: 33.7 g/dL (ref 30.0–36.0)
MCV: 75.9 fL — ABNORMAL LOW (ref 80.0–100.0)
Monocytes Absolute: 0.3 10*3/uL (ref 0.1–1.0)
Monocytes Relative: 4 %
Neutro Abs: 6.1 10*3/uL (ref 1.7–7.7)
Neutrophils Relative %: 78 %
Platelets: UNDETERMINED 10*3/uL (ref 150–400)
RBC: 4.15 MIL/uL (ref 3.87–5.11)
RDW: 16.9 % — ABNORMAL HIGH (ref 11.5–15.5)
WBC: 7.7 10*3/uL (ref 4.0–10.5)
nRBC: 0 /100 WBC

## 2021-04-17 LAB — URINALYSIS, ROUTINE W REFLEX MICROSCOPIC
Bilirubin Urine: NEGATIVE
Glucose, UA: NEGATIVE mg/dL
Ketones, ur: 5 mg/dL — AB
Nitrite: NEGATIVE
Protein, ur: 30 mg/dL — AB
Specific Gravity, Urine: 1.012 (ref 1.005–1.030)
pH: 5 (ref 5.0–8.0)

## 2021-04-17 LAB — COMPREHENSIVE METABOLIC PANEL
ALT: 23 U/L (ref 0–44)
AST: 41 U/L (ref 15–41)
Albumin: 2.5 g/dL — ABNORMAL LOW (ref 3.5–5.0)
Alkaline Phosphatase: 71 U/L (ref 38–126)
Anion gap: 13 (ref 5–15)
BUN: 23 mg/dL (ref 8–23)
CO2: 22 mmol/L (ref 22–32)
Calcium: 8.7 mg/dL — ABNORMAL LOW (ref 8.9–10.3)
Chloride: 98 mmol/L (ref 98–111)
Creatinine, Ser: 1.07 mg/dL — ABNORMAL HIGH (ref 0.44–1.00)
Glucose, Bld: 104 mg/dL — ABNORMAL HIGH (ref 70–99)
Potassium: 3.6 mmol/L (ref 3.5–5.1)
Sodium: 133 mmol/L — ABNORMAL LOW (ref 135–145)
Total Bilirubin: 0.7 mg/dL (ref 0.3–1.2)
Total Protein: 6.7 g/dL (ref 6.5–8.1)

## 2021-04-17 LAB — ECHOCARDIOGRAM COMPLETE
Area-P 1/2: 3.58 cm2
S' Lateral: 2.3 cm

## 2021-04-17 LAB — FIBRINOGEN: Fibrinogen: 418 mg/dL (ref 210–475)

## 2021-04-17 LAB — TYPE AND SCREEN
ABO/RH(D): A POS
Antibody Screen: NEGATIVE

## 2021-04-17 LAB — BRAIN NATRIURETIC PEPTIDE: B Natriuretic Peptide: 88.5 pg/mL (ref 0.0–100.0)

## 2021-04-17 LAB — PREALBUMIN: Prealbumin: 10.7 mg/dL — ABNORMAL LOW (ref 18–38)

## 2021-04-17 LAB — D-DIMER, QUANTITATIVE: D-Dimer, Quant: 5.36 ug/mL-FEU — ABNORMAL HIGH (ref 0.00–0.50)

## 2021-04-17 LAB — TROPONIN I (HIGH SENSITIVITY)
Troponin I (High Sensitivity): 10 ng/L (ref <18)
Troponin I (High Sensitivity): 9 ng/L (ref <18)

## 2021-04-17 LAB — RESP PANEL BY RT-PCR (FLU A&B, COVID) ARPGX2
Influenza A by PCR: NEGATIVE
Influenza A by PCR: NEGATIVE
Influenza B by PCR: NEGATIVE
Influenza B by PCR: NEGATIVE
SARS Coronavirus 2 by RT PCR: POSITIVE — AB
SARS Coronavirus 2 by RT PCR: POSITIVE — AB

## 2021-04-17 LAB — PROCALCITONIN: Procalcitonin: 2.07 ng/mL

## 2021-04-17 LAB — PHOSPHORUS: Phosphorus: 3 mg/dL (ref 2.5–4.6)

## 2021-04-17 LAB — LACTATE DEHYDROGENASE: LDH: 331 U/L — ABNORMAL HIGH (ref 98–192)

## 2021-04-17 MED ORDER — IOHEXOL 350 MG/ML SOLN
50.0000 mL | Freq: Once | INTRAVENOUS | Status: AC | PRN
  Administered 2021-04-17: 16:00:00 50 mL via INTRAVENOUS

## 2021-04-17 MED ORDER — SODIUM CHLORIDE 0.9% FLUSH
3.0000 mL | Freq: Two times a day (BID) | INTRAVENOUS | Status: DC
  Administered 2021-04-18 – 2021-04-20 (×4): 3 mL via INTRAVENOUS

## 2021-04-17 MED ORDER — PANTOPRAZOLE SODIUM 40 MG PO TBEC
40.0000 mg | DELAYED_RELEASE_TABLET | Freq: Every day | ORAL | Status: DC
  Administered 2021-04-17 – 2021-04-20 (×4): 40 mg via ORAL
  Filled 2021-04-17 (×4): qty 1

## 2021-04-17 MED ORDER — ACETAMINOPHEN 325 MG PO TABS
650.0000 mg | ORAL_TABLET | Freq: Four times a day (QID) | ORAL | Status: DC | PRN
  Administered 2021-04-17 – 2021-04-19 (×4): 650 mg via ORAL
  Filled 2021-04-17 (×4): qty 2

## 2021-04-17 MED ORDER — ALBUTEROL SULFATE HFA 108 (90 BASE) MCG/ACT IN AERS
2.0000 | INHALATION_SPRAY | Freq: Four times a day (QID) | RESPIRATORY_TRACT | Status: DC | PRN
  Filled 2021-04-17: qty 6.7

## 2021-04-17 MED ORDER — GUAIFENESIN-DM 100-10 MG/5ML PO SYRP
10.0000 mL | ORAL_SOLUTION | ORAL | Status: DC | PRN
  Administered 2021-04-17 – 2021-04-19 (×2): 10 mL via ORAL
  Filled 2021-04-17 (×2): qty 10

## 2021-04-17 MED ORDER — ENSURE ENLIVE PO LIQD
237.0000 mL | Freq: Two times a day (BID) | ORAL | Status: DC
  Administered 2021-04-17 – 2021-04-20 (×6): 237 mL via ORAL
  Filled 2021-04-17 (×2): qty 237

## 2021-04-17 MED ORDER — COLCHICINE 0.6 MG PO CAPS: 0.6000 mg | ORAL_CAPSULE | Freq: Every day | ORAL | Status: DC

## 2021-04-17 MED ORDER — SODIUM CHLORIDE 0.9 % IV BOLUS
1000.0000 mL | Freq: Once | INTRAVENOUS | Status: AC
  Administered 2021-04-17: 01:00:00 1000 mL via INTRAVENOUS

## 2021-04-17 MED ORDER — MIRABEGRON ER 25 MG PO TB24
25.0000 mg | ORAL_TABLET | Freq: Every day | ORAL | Status: DC
  Administered 2021-04-17 – 2021-04-20 (×4): 25 mg via ORAL
  Filled 2021-04-17 (×4): qty 1

## 2021-04-17 MED ORDER — ZINC SULFATE 220 (50 ZN) MG PO CAPS
220.0000 mg | ORAL_CAPSULE | Freq: Every day | ORAL | Status: DC
  Administered 2021-04-17 – 2021-04-20 (×4): 220 mg via ORAL
  Filled 2021-04-17 (×4): qty 1

## 2021-04-17 MED ORDER — ADULT MULTIVITAMIN W/MINERALS CH
1.0000 | ORAL_TABLET | Freq: Every day | ORAL | Status: DC
  Administered 2021-04-17 – 2021-04-20 (×4): 1 via ORAL
  Filled 2021-04-17 (×4): qty 1

## 2021-04-17 MED ORDER — ENOXAPARIN SODIUM 30 MG/0.3ML IJ SOSY
30.0000 mg | PREFILLED_SYRINGE | INTRAMUSCULAR | Status: DC
  Administered 2021-04-17 – 2021-04-20 (×4): 30 mg via SUBCUTANEOUS
  Filled 2021-04-17 (×4): qty 0.3

## 2021-04-17 MED ORDER — ACETAMINOPHEN 650 MG RE SUPP: 650.0000 mg | Freq: Four times a day (QID) | RECTAL | Status: DC | PRN

## 2021-04-17 MED ORDER — SODIUM CHLORIDE 0.9 % IV SOLN
1.0000 g | INTRAVENOUS | Status: DC
  Administered 2021-04-17: 11:00:00 1 g via INTRAVENOUS
  Filled 2021-04-17: qty 10

## 2021-04-17 MED ORDER — ALBUTEROL SULFATE (2.5 MG/3ML) 0.083% IN NEBU: 2.5000 mg | INHALATION_SOLUTION | Freq: Four times a day (QID) | RESPIRATORY_TRACT | Status: DC | PRN

## 2021-04-17 MED ORDER — OXYCODONE HCL 5 MG PO TABS: 5.0000 mg | ORAL_TABLET | Freq: Four times a day (QID) | ORAL | Status: DC | PRN

## 2021-04-17 MED ORDER — SODIUM CHLORIDE 0.9 % IV SOLN
2.0000 g | INTRAVENOUS | Status: AC
  Administered 2021-04-18 – 2021-04-19 (×2): 2 g via INTRAVENOUS
  Filled 2021-04-17 (×2): qty 20

## 2021-04-17 MED ORDER — IBUPROFEN 800 MG PO TABS: 800.0000 mg | ORAL_TABLET | Freq: Two times a day (BID) | ORAL | Status: DC

## 2021-04-17 MED ORDER — HYDROCOD POLST-CPM POLST ER 10-8 MG/5ML PO SUER: 5.0000 mL | Freq: Two times a day (BID) | ORAL | Status: DC | PRN

## 2021-04-17 MED ORDER — ASCORBIC ACID 500 MG PO TABS
500.0000 mg | ORAL_TABLET | Freq: Every day | ORAL | Status: DC
  Administered 2021-04-17 – 2021-04-20 (×4): 500 mg via ORAL
  Filled 2021-04-17 (×4): qty 1

## 2021-04-17 MED ORDER — SODIUM CHLORIDE 0.9 % IV SOLN: INTRAVENOUS | Status: AC

## 2021-04-17 MED ORDER — LORATADINE 10 MG PO TABS
10.0000 mg | ORAL_TABLET | Freq: Every day | ORAL | Status: DC
  Administered 2021-04-17 – 2021-04-20 (×4): 10 mg via ORAL
  Filled 2021-04-17 (×4): qty 1

## 2021-04-17 MED ORDER — SACCHAROMYCES BOULARDII 250 MG PO CAPS
250.0000 mg | ORAL_CAPSULE | Freq: Two times a day (BID) | ORAL | Status: DC
  Administered 2021-04-17 – 2021-04-20 (×7): 250 mg via ORAL
  Filled 2021-04-17 (×7): qty 1

## 2021-04-17 MED ORDER — COLCHICINE 0.6 MG PO TABS
0.6000 mg | ORAL_TABLET | Freq: Every day | ORAL | Status: DC
  Administered 2021-04-18 – 2021-04-20 (×3): 0.6 mg via ORAL
  Filled 2021-04-17 (×3): qty 1

## 2021-04-17 NOTE — Progress Notes (Signed)
Echocardiogram 2D Echocardiogram has been performed.  Nicole Rush M 04/17/2021, 2:02 PM

## 2021-04-17 NOTE — ED Notes (Signed)
Pt reports vaginal burning and pain. Will notify MD

## 2021-04-17 NOTE — Evaluation (Signed)
Occupational Therapy Evaluation Patient Details Name: Nicole Rush MRN: [id number] DOB: 1941-02-27 Today's Date: 04/17/2021   History of Present Illness Pt is Rush 80 y/o female admitted 12/28 secondary to syncope. Also found to be covid+. PMH includes breast cancer, HTN, and CVA.   Clinical Impression   Pt admitted with above. She demonstrates the below listed deficits and will benefit from continued OT to maximize safety and independence with BADLs.  Pt presents to OT with generalized weakness, decreased activity tolerance, impaired balance, impaired cognition.  She currently requires min A, overall, for ADLs and min A for functional mobiltiy.  She has been living with her grand daughter and has required intermittent assist for ADLs.  Anticipate pt may require SNF level rehab at discharge.  If family able to provide current level of care and choose to take her home, recommend [facility].        Recommendations for follow up therapy are one component of a multi-disciplinary discharge planning process, led by the attending physician.  Recommendations may be updated based on patient status, additional functional criteria and insurance authorization.   Follow Up Recommendations  Skilled nursing-short term rehab (<3 hours/day)    Assistance Recommended at Discharge Frequent or constant Supervision/Assistance  Functional Status Assessment  Patient has had a recent decline in their functional status and demonstrates the ability to make significant improvements in function in a reasonable and predictable amount of time.  Equipment Recommendations  BSC/3in1;Other (comment)    Recommendations for Other Services       Precautions / Restrictions Precautions Precautions: Fall Restrictions Weight Bearing Restrictions: No      Mobility Bed Mobility Overal bed mobility: Needs Assistance Bed Mobility: Supine to Sit;Sit to Supine     Supine to sit: Min assist Sit to supine: Min assist    General bed mobility comments: Assist for trunk and LE assist.    Transfers Overall transfer level: Needs assistance Equipment used: 2 person hand held assist Transfers: Sit to/from Stand Sit to Stand: Min assist;+2 physical assistance           General transfer comment: Min A for lift assist and steadying.      Balance Overall balance assessment: Needs assistance Sitting-balance support: No upper extremity supported;Feet supported Sitting balance-Leahy Scale: Fair     Standing balance support: Bilateral upper extremity supported Standing balance-Leahy Scale: Poor Standing balance comment: Reliant on BUE and external support                           ADL either performed or assessed with clinical judgement   ADL Overall ADL's : Needs assistance/impaired Eating/Feeding: Set up;Sitting   Grooming: Wash/dry hands;Wash/dry face;Oral care;Set up;Supervision/safety;Sitting   Upper Body Bathing: Minimal assistance;Sitting   Lower Body Bathing: Minimal assistance;Sit to/from stand   Upper Body Dressing : Minimal assistance   Lower Body Dressing: Minimal assistance;Sit to/from stand   Toilet Transfer: Maximal assistance;Ambulation;BSC/3in1;Rolling walker (2 wheels)   Toileting- Clothing Manipulation and Hygiene: Minimal assistance;Sit to/from stand       Functional mobility during ADLs: Minimal assistance;Rolling walker (2 wheels)       Vision Baseline Vision/History: 1 Wears glasses Ability to See in Adequate Light: 0 Adequate Patient Visual Report: No change from baseline       Perception     Praxis      Pertinent Vitals/Pain Pain Assessment: No/denies pain     Hand Dominance Right   Extremity/Trunk Assessment Upper Extremity Assessment  Upper Extremity Assessment: Generalized weakness   Lower Extremity Assessment Lower Extremity Assessment: Defer to PT evaluation   Cervical / Trunk Assessment Cervical / Trunk Assessment: Kyphotic    Communication Communication Communication: No difficulties   Cognition Arousal/Alertness: Awake/alert Behavior During Therapy: WFL for tasks assessed/performed Overall Cognitive Status: Impaired/Different from baseline Area of Impairment: Problem solving;Safety/judgement;Following commands;Memory;Awareness;Attention                   Current Attention Level: Sustained Memory: Decreased short-term memory;Decreased recall of precautions Following Commands: Follows one step commands consistently Safety/Judgement: Decreased awareness of safety;Decreased awareness of deficits Awareness: Intellectual Problem Solving: Slow processing       General Comments  Pt's granddaughter present throughout.  Orthostatic BPs monitored and were stable throughout session - see vitals flow sheet    Exercises     Shoulder Instructions      Home Living Family/patient expects to be discharged to:: Private residence Living Arrangements: Children;Other relatives (grand daughter) Available Help at Discharge: Family;Available 24 hours/day Type of Home: House Home Access: Stairs to enter [employer] of Steps: 5 Entrance Stairs-Rails: Right Home Layout: One level     Bathroom Shower/Tub: Teacher, early years/pre: Standard     Home Equipment: Scientist, research (medical) (2 wheels)          Prior Functioning/Environment Prior Level of Function : Needs assist             Mobility Comments: ambulating without RW until last few days ADLs Comments: Assist with tub transfer and dressing.   [place] daughter reports she has had a steady decline of cognition        OT Problem List: Decreased strength;Decreased activity tolerance;Impaired balance (sitting and/or standing);Decreased cognition;Decreased safety awareness;Decreased knowledge of use of DME or AE      OT Treatment/Interventions: Self-care/ADL training;Therapeutic exercise;Energy conservation;DME and/or AE  instruction;Therapeutic activities;Cognitive remediation/compensation;Patient/family education;Balance training    OT Goals(Current goals can be found in the care plan section) Acute Rehab OT Goals Patient Stated Goal: did not state OT Goal Formulation: With patient/family Time For Goal Achievement: 05/01/21 Potential to Achieve Goals: Good ADL Goals Pt Will Perform Grooming: with min guard assist;standing Pt Will Perform Upper Body Bathing: with supervision;with set-up;sitting Pt Will Perform Lower Body Bathing: with min guard assist;sit to/from stand Pt Will Perform Upper Body Dressing: with set-up;with supervision;sitting Pt Will Perform Lower Body Dressing: with min guard assist;sit to/from stand Pt Will Transfer to Toilet: with min guard assist;ambulating;regular height toilet;bedside commode;grab bars Pt Will Perform Toileting - Clothing Manipulation and hygiene: with min guard assist;sit to/from stand  OT Frequency: Min 2X/week   Barriers to D/C:            Co-evaluation PT/OT/SLP Co-Evaluation/Treatment: Yes Reason for Co-Treatment: For patient/therapist safety;To address functional/ADL transfers PT goals addressed during session: Mobility/safety with mobility;Balance OT goals addressed during session: ADL's and self-care      AM-PAC OT "6 Clicks" Daily Activity     Outcome Measure Help from another person eating meals?: A Little Help from another person taking care of personal grooming?: A Little Help from another person toileting, which includes using toliet, bedpan, or urinal?: A Little Help from another person bathing (including washing, rinsing, drying)?: A Little Help from another person to put on and taking off regular upper body clothing?: A Little Help from another person to put on and taking off regular lower body clothing?: A Little 6 Click Score: 18   End of Session Equipment Utilized  During Treatment: Gait belt Nurse Communication: Mobility  status  Activity Tolerance: Patient limited by fatigue Patient left: in bed;with call bell/phone within reach;with family/visitor present  OT Visit Diagnosis: Unsteadiness on feet (R26.81);Cognitive communication deficit (R41.841)                Time: 0092-3300 OT Time Calculation (min): 26 min Charges:  OT General Charges $OT Visit: 1 Visit OT Evaluation $OT Eval Moderate Complexity: 1 Mod  Nilsa Nutting., OTR/L Acute Rehabilitation Services Pager [phone] Office [phone]   Lucille Passy M 04/17/2021, 2:54 PM

## 2021-04-17 NOTE — ED Notes (Signed)
Nurse and PA aware of pt B/P

## 2021-04-17 NOTE — ED Provider Notes (Signed)
Emergency Medicine Provider Triage Evaluation Note  Nicole Rush , a 80 y.o. female  was evaluated in triage.  Pt complains of syncope x3.  States when up and moving around she has been getting dizzy while standing and then passing out.  States she has not been eating/drinking all that well since being home from the hospital.  Denies fever.  Recent flu A.  Review of Systems  Positive: syncope Negative: fever  Physical Exam  BP (!) 88/54 (BP Location: Left Arm)    Pulse 82    Temp 98.9 F (37.2 C)    Resp 16    SpO2 96%   Gen:   Awake, no distress, appears pale Resp:  Normal effort  MSK:   Moves extremities without difficulty  Other:  thin  Medical Decision Making  Medically screening exam initiated at 12:38 AM.  Appropriate orders placed.  Nicole Rush was informed that the remainder of the evaluation will be completed by another provider, this initial triage assessment does not replace that evaluation, and the importance of remaining in the ED until their evaluation is complete.  Hypotensive in triage but not tachycardic or febrile.  Recent admission with pericardial window (VATS due to pericarditis), felt to be from influenza A.  Also anemic during admission.  AAOx3 during triage.  Denies chest pain at present but is nauseated.  Will check EKG, labs, type and screen, CXR, CT head given recurrent syncope.  Charge RN aware, will move to next available bed.   Larene Pickett, PA-C 04/17/21 0044    Orpah Greek, MD 04/17/21 [phone]

## 2021-04-17 NOTE — Evaluation (Signed)
Physical Therapy Evaluation Patient Details Name: Nicole Rush MRN: [id number] DOB: 05/07/1940 Today's Date: 04/17/2021  History of Present Illness  Pt is an 80 y/o female admitted 12/28 secondary to syncope. Also found to be covid+. PMH includes breast cancer, HTN, and CVA.  Clinical Impression  Pt admitted secondary to problem above with deficits below. Pt requiring min A +2 to stand and ambulate short distance within ED room. Fatiguing easily  and presenting with cognitive deficits throughout. Recommending SNF level therapies to address current deficits. Will continue to follow acutely.        Recommendations for follow up therapy are one component of a multi-disciplinary discharge planning process, led by the attending physician.  Recommendations may be updated based on patient status, additional functional criteria and insurance authorization.  Follow Up Recommendations Skilled nursing-short term rehab (<3 hours/day)    Assistance Recommended at Discharge Frequent or constant Supervision/Assistance  Functional Status Assessment Patient has had a recent decline in their functional status and demonstrates the ability to make significant improvements in function in a reasonable and predictable amount of time.  Equipment Recommendations  Hospital bed;Wheelchair cushion (measurements PT);Wheelchair (measurements PT)    Recommendations for Other Services       Precautions / Restrictions Precautions Precautions: Fall Restrictions Weight Bearing Restrictions: No      Mobility  Bed Mobility Overal bed mobility: Needs Assistance Bed Mobility: Supine to Sit;Sit to Supine     Supine to sit: Min assist Sit to supine: Min assist   General bed mobility comments: Assist for trunk and LE assist.    Transfers Overall transfer level: Needs assistance Equipment used: 2 person hand held assist Transfers: Sit to/from Stand Sit to Stand: Min assist;+2 physical assistance            General transfer comment: Min A for lift assist and steadying.    Ambulation/Gait Ambulation/Gait assistance: Min assist;+2 physical assistance Gait Distance (Feet): 5 Feet Assistive device: 2 person hand held assist Gait Pattern/deviations: Step-through pattern;Decreased stride length Gait velocity: Decreased     General Gait Details: Min A +2 for steadying. Pt with increased unsteadiness as she became fatigued.  Stairs            Wheelchair Mobility    Modified Rankin (Stroke Patients Only)       Balance Overall balance assessment: Needs assistance Sitting-balance support: No upper extremity supported;Feet supported Sitting balance-Leahy Scale: Fair     Standing balance support: Bilateral upper extremity supported Standing balance-Leahy Scale: Poor Standing balance comment: Reliant on BUE and external support                             Pertinent Vitals/Pain Pain Assessment: No/denies pain    Home Living Family/patient expects to be discharged to:: Private residence Living Arrangements: Children;Other relatives (grand daughter) Available Help at Discharge: Family;Available 24 hours/day Type of Home: House Home Access: Stairs to enter Entrance Stairs-Rails: Right Entrance Stairs-Number of Steps: 5   Home Layout: One level Home Equipment: Scientist, research (medical) (2 wheels)      Prior Function Prior Level of Function : Needs assist             Mobility Comments: ambulating without RW until last few days ADLs Comments: Assist with tub transfer and dressing.   [place] daughter reports she has had a steady decline of cognition     Hand Dominance   Dominant Hand: Right    Extremity/Trunk  Assessment   Upper Extremity Assessment Upper Extremity Assessment: Defer to OT evaluation    Lower Extremity Assessment Lower Extremity Assessment: Generalized weakness    Cervical / Trunk Assessment Cervical / Trunk Assessment:  Kyphotic  Communication   Communication: No difficulties  Cognition Arousal/Alertness: Awake/alert Behavior During Therapy: WFL for tasks assessed/performed Overall Cognitive Status: Impaired/Different from baseline Area of Impairment: Problem solving;Safety/judgement;Following commands;Memory;Awareness;Attention                   Current Attention Level: Sustained Memory: Decreased short-term memory;Decreased recall of precautions Following Commands: Follows one step commands consistently Safety/Judgement: Decreased awareness of safety;Decreased awareness of deficits Awareness: Intellectual Problem Solving: Slow processing          General Comments General comments (skin integrity, edema, etc.): Pt's granddaughter present throughout    Exercises     Assessment/Plan    PT Assessment Patient needs continued PT services  PT Problem List Decreased strength;Decreased range of motion;Decreased activity tolerance;Decreased balance;Decreased mobility;Decreased coordination;Decreased cognition;Decreased safety awareness;Decreased knowledge of precautions       PT Treatment Interventions DME instruction;Gait training;Stair training;Functional mobility training;Therapeutic activities;Therapeutic exercise;Balance training;Patient/family education    PT Goals (Current goals can be found in the Care Plan section)  Acute Rehab PT Goals Patient Stated Goal: to go home PT Goal Formulation: With patient Time For Goal Achievement: 05/01/21 Potential to Achieve Goals: Good    Frequency Min 2X/week   Barriers to discharge        Co-evaluation PT/OT/SLP Co-Evaluation/Treatment: Yes Reason for Co-Treatment: For patient/therapist safety;To address functional/ADL transfers PT goals addressed during session: Mobility/safety with mobility;Balance         AM-PAC PT "6 Clicks" Mobility  Outcome Measure Help needed turning from your back to your side while in a flat bed without  using bedrails?: A Little Help needed moving from lying on your back to sitting on the side of a flat bed without using bedrails?: A Little Help needed moving to and from a bed to a chair (including a wheelchair)?: Total Help needed standing up from a chair using your arms (e.g., wheelchair or bedside chair)?: Total Help needed to walk in hospital room?: Total Help needed climbing 3-5 steps with a railing? : Total 6 Click Score: 10    End of Session Equipment Utilized During Treatment: Gait belt Activity Tolerance: Patient limited by fatigue Patient left: in bed;with call bell/phone within reach;with family/visitor present (on stretcher in ED) Nurse Communication: Mobility status PT Visit Diagnosis: Unsteadiness on feet (R26.81);Muscle weakness (generalized) (M62.81);Difficulty in walking, not elsewhere classified (R26.2)    Time: 5093-2671 PT Time Calculation (min) (ACUTE ONLY): 21 min   Charges:   PT Evaluation $PT Eval Moderate Complexity: 1 Mod          Reuel Derby, PT, DPT  Acute Rehabilitation Services  Pager: [phone] Office: [phone]   Rudean Hitt 04/17/2021, 2:32 PM

## 2021-04-17 NOTE — ED Triage Notes (Signed)
Patient reports multiple syncopal episodes since yesterday , denies injury, hypotensive at triage , denies pain / respirations unlabored .

## 2021-04-17 NOTE — ED Notes (Addendum)
Microbiology is refusing to run the repeat COVID test. Dr. Tamala Julian aware.

## 2021-04-17 NOTE — H&P (Addendum)
History and Physical    Nicole Rush PPI:[id number] DOB: 05-14-1940 DOA: 04/17/2021  Referring MD/NP/PA: Shela Leff, MD PCP: Lin Landsman, MD  Patient coming from: Home via EMS  Chief Complaint: Dizziness and weakness  I have personally briefly reviewed patient's old medical records in [place]   HPI: Nicole Rush is a 80 y.o. female with medical history significant of hypertension, CVA, asthma, breast cancer s/p lumpectomy, and GERD who presents with complaints of dizziness and weakness.  Patient had just recently been hospitalized 12/1-12/17 after presenting with complaints of chest pain and shortness of breath found to have pericarditis/large pericardial effusion felt to be postviral from influenza A infection complicated by tamponade status post VATS and pericardial window on 12/13.  She was sent home on ibuprofen taper, and and colchicine.  She has been staying with her granddaughter and her children since being discharged from the hospital.  She had [place] home health services coming out and patient had gotten to the point where she was not needing assistance to ambulate around home and was able to dress herself and has started cooking.  Ever since she left the hospital she has continued to have productive cough that was unchanged.  However, patient had started regressing recently.  Her granddaughter notes that she was becoming intermittently confused and weak.  Patient notes associated symptoms of intermittent chills, urinary frequency, and burning discomfort with urination,  and dizziness with changes in position.  Last night her granddaughter found her laying on the floor.  She does not think that she fell but had tried to go use the bathroom and ripped off her adult briefs.  Patient was too weak to get up on her own and required her granddaughter's assistance as well as to put on her close.  The granddaughter also noted that the patient seemed confused at that  times similar to how she was prior to coming to the hospital on at the beginning of this month.  Patient reportedly had been eating okay since being home.  Denied any complaints of nausea, vomiting, abdominal pain, and diarrhea.  Wounds from prior procedure showed no signs of redness or erythema.  EMS was called she reportedly passed out 3 times for some unknown duration and time.  ED Course: While in triage patient was noted to have blood pressure as low as 88/54.  Patient was noted to be afebrile, pulse within normal limits, respirations 15-22, and O2 saturation maintained on room air.  Labs significant for hemoglobin 10.6, MCV 75.9, MCH 25.5, platelets clumped, sodium 133, BUN 23, creatinine 1.07, albumin 2.9, BNP 88.5, and high-sensitivity troponin negative x2.  EKG did not note any significant ischemic changes chest x-ray noted no acute abnormality.  CT scan of the head without contrast noted generalized cerebral atrophy with no acute intracranial abnormality and moderate to marked severity bilateral ethmoid sinus disease.  COVID-19 screening was positive.  Trace amount of linear atelectasis within the right lung base.  Urinalysis noted small hemoglobin, trace ketones, trace leukocytes, rare bacteria, 11-20 WBCs.  Bedside echocardiogram by ED provider noted just small pleural effusion.  Patient having given 1 L normal saline IV fluids with improvement in blood pressures.  TRH called to admit.   Review of Systems  Constitutional:  Positive for chills and malaise/fatigue. Negative for fever.  HENT:  Positive for congestion.   Eyes:  Negative for photophobia and pain.  Respiratory:  Positive for cough and shortness of breath.   Cardiovascular:  Negative for  chest pain and leg swelling.  Gastrointestinal:  Negative for abdominal pain, diarrhea, nausea and vomiting.  Genitourinary:  Positive for dysuria and frequency.  Musculoskeletal:  Negative for falls and myalgias.  Skin:  Negative for rash.   Neurological:  Positive for loss of consciousness and weakness.  Psychiatric/Behavioral:  Positive for memory loss. Negative for substance abuse.    Past Medical History:  Diagnosis Date   Asthma    Cancer ([place]) 03/2019   left breast DCIS   Hypertension    Stroke ([place])    per pt no defecits    Past Surgical History:  Procedure Laterality Date   BREAST LUMPECTOMY WITH AXILLARY LYMPH NODE BIOPSY Left 04/06/2019   Procedure: LEFT BREAST LUMPECTOMY WITH LEFT SENTINEL LYMPH NODE BIOPSY;  Surgeon: [name], MD;  Location: [facility];  Service: General;  Laterality: Left;   BREAST SURGERY     RE-EXCISION OF BREAST LUMPECTOMY Left 05/04/2019   Procedure: RE-EXCISION OF LEFT BREAST LUMPECTOMY;  Surgeon: [name], MD;  Location: [facility];  Service: General;  Laterality: Left;     reports that she has quit smoking. She has never used smokeless tobacco. She reports that she does not drink alcohol and does not use drugs.  No Known Allergies  Family History  Problem Relation Age of Onset   Breast cancer Mother    Lung cancer Father     Prior to Admission medications   Medication Sig Start Date End Date Taking? Authorizing Provider  Colchicine (MITIGARE) 0.6 MG CAPS Take 0.6 mg by mouth daily at 6 (six) AM. 04/07/21  Yes Aline August, MD  DM-Phenylephrine-Acetaminophen (VICKS DAYQUIL COLD & FLU PO) Take 1 Dose by mouth 3 (three) times daily as needed (cough/fever/cold symptoms).   Yes [provider]  ibuprofen (ADVIL) 800 MG tablet 800 mg 3 times a day for 10 days then 2 times a day for 10 days then once a day for 10 days Patient taking differently: Take 800 mg by mouth 2 (two) times daily. 800 mg 3 times a day for 10 days then 2 times a day for 10 days then once a day for 10 days 04/06/21  Yes Aline August, MD  loratadine (CLARITIN) 10 MG tablet Take 1 tablet (10 mg total) by mouth daily. 04/06/21  Yes Aline August, MD  Multiple  Vitamin (MULTIVITAMIN WITH MINERALS) TABS tablet Take 1 tablet by mouth daily.   Yes [provider]  MYRBETRIQ 25 MG TB24 tablet Take 25 mg by mouth daily. 03/12/21  Yes [provider]  oxyCODONE (OXY IR/ROXICODONE) 5 MG immediate release tablet Take 1 tablet (5 mg total) by mouth every 6 (six) hours as needed for severe pain. 04/06/21  Yes Aline August, MD  pantoprazole (PROTONIX) 40 MG tablet Take 1 tablet (40 mg total) by mouth daily. 04/07/21  Yes Aline August, MD  polyethylene glycol (MIRALAX / GLYCOLAX) 17 g packet Take 17 g by mouth daily as needed. Patient taking differently: Take 17 g by mouth daily as needed for moderate constipation. 04/06/21  Yes Aline August, MD  senna-docusate (SENOKOT-S) 8.6-50 MG tablet Take 1 tablet by mouth 2 (two) times daily. 04/06/21  Yes Aline August, MD  anastrozole (ARIMIDEX) 1 MG tablet Take 1 tablet (1 mg total) by mouth daily. 11/06/20   Nicholas Lose, MD    Physical Exam:  Constitutional: Thin elderly female who appears to be in no acute distress with intermittent cough Vitals:   04/17/21 0500 04/17/21 0530  04/17/21 0600 04/17/21 0630  BP: 115/80 107/66 117/68 120/67  Pulse: 79 77 78 79  Resp: 20 18 20 20   Temp:      SpO2: 100% 96% 94% 97%   Eyes: PERRL, lids and conjunctivae normal ENMT: Mucous membranes are dry. Posterior pharynx clear of any exudate or lesions. Neck: normal, supple, no masses, no thyromegaly Respiratory: clear to auscultation bilaterally, no wheezing, and no crackles. Normal respiratory effort. No accessory muscle use.  Cardiovascular: Regular rate and rhythm.  No extremity edema. 2+ pedal pulses.   Abdomen: no tenderness, no masses palpated. No hepatosplenomegaly. Bowel sounds positive.  Musculoskeletal: no clubbing / cyanosis. No joint deformity upper and lower extremities. Good ROM, no contractures. Normal muscle tone.  Skin: Poor skin turgor.  Post surgical wounds of the left lateral side of  the neck and left chest wall healing well without significant signs of erythema. Neurologic: CN 2-12 grossly intact.  Able to move all extremities.  Psychiatric: Normal judgment and insight. Alert and oriented x 3, but noted to be mildly confused about previously being admitted at the same hospital. Normal mood.     Labs on Admission: I have personally reviewed following labs and imaging studies  CBC: Recent Labs  Lab 04/17/21 0103  WBC 7.7  NEUTROABS 6.1  HGB 10.6*  HCT 31.5*  MCV 75.9*  PLT PLATELET CLUMPS NOTED ON SMEAR, UNABLE TO ESTIMATE   Basic Metabolic Panel: Recent Labs  Lab 04/17/21 0103  NA 133*  K 3.6  CL 98  CO2 22  GLUCOSE 104*  BUN 23  CREATININE 1.07*  CALCIUM 8.7*   GFR: Estimated Creatinine Clearance: 26.1 mL/min (A) (by C-G formula based on SCr of 1.07 mg/dL (H)). Liver Function Tests: Recent Labs  Lab 04/17/21 0103  AST 41  ALT 23  ALKPHOS 71  BILITOT 0.7  PROT 6.7  ALBUMIN 2.5*   No results for input(s): LIPASE, AMYLASE in the last 168 hours. No results for input(s): AMMONIA in the last 168 hours. Coagulation Profile: No results for input(s): INR, PROTIME in the last 168 hours. Cardiac Enzymes: No results for input(s): CKTOTAL, CKMB, CKMBINDEX, TROPONINI in the last 168 hours. BNP (last 3 results) No results for input(s): PROBNP in the last 8760 hours. HbA1C: No results for input(s): HGBA1C in the last 72 hours. CBG: No results for input(s): GLUCAP in the last 168 hours. Lipid Profile: No results for input(s): CHOL, HDL, LDLCALC, TRIG, CHOLHDL, LDLDIRECT in the last 72 hours. Thyroid Function Tests: No results for input(s): TSH, T4TOTAL, FREET4, T3FREE, THYROIDAB in the last 72 hours. Anemia Panel: No results for input(s): VITAMINB12, FOLATE, FERRITIN, TIBC, IRON, RETICCTPCT in the last 72 hours. Urine analysis:    Component Value Date/Time   COLORURINE YELLOW 04/17/2021 0500   APPEARANCEUR HAZY (A) 04/17/2021 0500   LABSPEC  1.012 04/17/2021 0500   PHURINE 5.0 04/17/2021 0500   GLUCOSEU NEGATIVE 04/17/2021 0500   HGBUR SMALL (A) 04/17/2021 0500   BILIRUBINUR NEGATIVE 04/17/2021 0500   KETONESUR 5 (A) 04/17/2021 0500   PROTEINUR 30 (A) 04/17/2021 0500   NITRITE NEGATIVE 04/17/2021 0500   LEUKOCYTESUR TRACE (A) 04/17/2021 0500   Sepsis Labs: No results found for this or any previous visit (from the past 240 hour(s)).   Radiological Exams on Admission: CT HEAD WO CONTRAST (5MM)  Result Date: 04/17/2021 CLINICAL DATA:  Multiple syncopal episodes. EXAM: CT HEAD WITHOUT CONTRAST TECHNIQUE: Contiguous axial images were obtained from the base of the skull through the vertex without  intravenous contrast. COMPARISON:  April 04, 2011 FINDINGS: Brain: There is mild cerebral atrophy with widening of the extra-axial spaces and ventricular dilatation. There are areas of decreased attenuation within the white matter tracts of the supratentorial brain, consistent with microvascular disease changes. Vascular: No hyperdense vessel or unexpected calcification. Skull: Normal. Negative for fracture or focal lesion. Sinuses/Orbits: There is moderate to marked severity bilateral ethmoid sinus mucosal thickening. Other: None. IMPRESSION: 1. Generalized cerebral atrophy. 2. No acute intracranial abnormality. 3. Moderate to marked severity bilateral ethmoid sinus disease. Electronically Signed   By: Virgina Norfolk M.D.   On: 04/17/2021 01:37   DG Chest Port 1 View  Result Date: 04/17/2021 CLINICAL DATA:  Syncope. EXAM: PORTABLE CHEST 1 VIEW COMPARISON:  April 06, 2021 FINDINGS: The right internal jugular venous catheter seen on the prior study has been removed. A trace amount of linear atelectasis is noted within the right lung base. There is no evidence of acute infiltrate, pleural effusion or pneumothorax. The heart size and mediastinal contours are within normal limits. Radiopaque surgical clips are seen along the lateral aspect  of the mid left chest wall. The visualized skeletal structures are unremarkable. IMPRESSION: No acute or active cardiopulmonary disease. Electronically Signed   By: Virgina Norfolk M.D.   On: 04/17/2021 01:23    EKG: Independently reviewed.  Normal sinus rhythm 83 bpm  Assessment/Plan Syncope 2/2 dehydration: Acute.  Patient presents after passing out at least 3 times with complaints of dizziness with change in position.  On physical exam patient with skin turgor clinically appears to be dehydrated.  Chest x-ray did not note any acute abnormality.  Suspect secondary to drops in blood pressure related to dehydration. -Admit to a telemetry bed -Check orthostatic vital sign -Check echocardiogram given recent history of pericarditis with pericardial effusion requiring VATS -Continue IV fluids as seen below -PT/OT to eval and treat  Acute kidney injury: Creatinine elevated up to 1.07 with BUN 23.  Baseline creatinine previously had been 0.53 on 12/17 prior to discharge.  Suspect secondary to dehydration with the possibility of recent NSAID use being a contributing factor.  Given 1 L normal saline IV fluids. -Hold possible nephrotoxic agents -Continue normal saline IV fluids then 100 mL/h x 24 hours -Recheck kidney function tomorrow morning  COVID-19 infection  recent history of influenza A: Acute.  Patient had been positive for influenza A during last hospitalization.  On recheck today patient was influenza negative, but positive for COVID-19.  Chest x-ray did not note any acute abnormalities.  Family requests retesting despite being advised that the possibility of a positive is highly likely given recent hospitalization. -Focused COVID-19 order set utilized -Retest for COVID -Check inflammatory markers -Albuterol inhaler as needed -Antitussives as needed -Vitamin C and zinc  History of pericarditis and pericardial effusion: Noted during prior hospitalization.  Status post VATS due to signs of  cardiac tamponade. -Continue colchicine  -Hold ibuprofen due due to AKI.  Consider discussing with cardiology in a.m. -Oxycodone as needed  Hyponatremia: Acute.  Sodium 133 on admission.  Suspect this is hypovolemic hyponatremia given reports of decreased p.o. intake. -IV fluids as noted above -Recheck sodium levels tomorrow -May warrant further work-up if not improved with current management  Microcytic hypochromic anemia: Hemoglobin 10.6 g/dL on admission, but had been 8.1 prior to discharge on 04/06/2021.  Suspect increase secondary to hemoconcentration due to dehydration.  She had been taking high doses of ibuprofen when she could also give concern for GI bleed.\ -Recheck  CBC tomorrow morning  Suspected UTI: Patient reports having urinary frequency and burning discomfort.  Urinalysis noted small hemoglobin, small amount of ketones, trace leukocytes, rare bacteria, 6-10 squamous epithelial cells, and 11-20 WBCs.  Similar to previous on 12/6 where culture grew out multiple species for which recollection was recommended. -Follow-up urine culture -Rocephin IV -probiotics  History of breast cancer: Patient status post lumpectomies and had been on anastrozole up until recent hospitalization when it was held. -Resume anastrozole when medically appropriate  Protein calorie malnutrition: On admission albumin noted to be low at 2.  Suspect patient likely had poor p.o. intake despite what was reported by patient's granddaughter at bedside. -Add on prealbumin -Ensure supplements between meals  GERD -Continue Protonix  DVT prophylaxis: Lovenox  Code Status: Full Family Communication: Granddaughter updated at bedside Disposition Plan: To be determined Consults called: PT/OT Admission status: Inpatient  Norval Morton MD [facility]   If 7PM-7AM, please contact night-coverage   04/17/2021, 7:32 AM

## 2021-04-17 NOTE — ED Provider Notes (Signed)
[facility] EMERGENCY DEPARTMENT Provider Note   CSN: [id number] Arrival date & time: 04/17/21  0024     History Chief Complaint  Patient presents with   Syncope/Hypotension     Kyleigh Jana Swartzlander is a 80 y.o. female.  Patient presents to the emergency department for evaluation of syncope.  Patient has passed out at least 3 times at home today.  Syncope appears to happen with changes in position.  She is not experiencing any acute pain.  No injury.      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   Hypertension    Stroke [facility])    per pt no defecits    Patient Active Problem List   Diagnosis Date Noted   Syncope 04/17/2021   Protein-calorie malnutrition, severe 04/01/2021   Cardiac tamponade    Protein-calorie malnutrition, moderate ([place]) 03/22/2021   Hyperlipidemia 03/22/2021   Essential hypertension 03/22/2021   Leukocytosis 03/22/2021   Falls 03/22/2021   Pericarditis 03/21/2021   Malignant neoplasm of upper-outer quadrant of left breast in female, estrogen receptor positive ([place]) 03/22/2019   Chest pain 08/11/2012   GERD (gastroesophageal reflux disease) 08/11/2012   Generalized anxiety disorder 08/11/2012    Past Surgical History:  Procedure Laterality Date   BREAST LUMPECTOMY WITH AXILLARY LYMPH NODE BIOPSY Left 04/06/2019   Procedure: LEFT BREAST LUMPECTOMY WITH LEFT SENTINEL LYMPH NODE BIOPSY;  Surgeon: [name], MD;  Location: [place];  Service: General;  Laterality: Left;   BREAST SURGERY     RE-EXCISION OF BREAST LUMPECTOMY Left 05/04/2019   Procedure: RE-EXCISION OF LEFT BREAST LUMPECTOMY;  Surgeon: [name], MD;  Location: [place];  Service: General;  Laterality: Left;     OB History   No obstetric history on file.     Family History  Problem Relation Age of Onset   Breast cancer Mother    Lung cancer Father     Social History    Tobacco Use   Smoking status: Former   Smokeless tobacco: Never  Scientific laboratory technician Use: Never used  Substance Use Topics   Alcohol use: No   Drug use: No    Home Medications Prior to Admission medications   Medication Sig Start Date End Date Taking? Authorizing Provider  Colchicine (MITIGARE) 0.6 MG CAPS Take 0.6 mg by mouth daily at 6 (six) AM. 04/07/21  Yes Aline August, MD  DM-Phenylephrine-Acetaminophen (VICKS DAYQUIL COLD & FLU PO) Take 1 Dose by mouth 3 (three) times daily as needed (cough/fever/cold symptoms).   Yes [provider]  ibuprofen (ADVIL) 800 MG tablet 800 mg 3 times a day for 10 days then 2 times a day for 10 days then once a day for 10 days Patient taking differently: Take 800 mg by mouth 2 (two) times daily. 800 mg 3 times a day for 10 days then 2 times a day for 10 days then once a day for 10 days 04/06/21  Yes Aline August, MD  loratadine (CLARITIN) 10 MG tablet Take 1 tablet (10 mg total) by mouth daily. 04/06/21  Yes Aline August, MD  Multiple Vitamin (MULTIVITAMIN WITH MINERALS) TABS tablet Take 1 tablet by mouth daily.   Yes [provider]  MYRBETRIQ 25 MG TB24 tablet Take 25 mg by mouth daily. 03/12/21  Yes [provider]  oxyCODONE (OXY IR/ROXICODONE) 5 MG immediate release tablet Take 1 tablet (5  mg total) by mouth every 6 (six) hours as needed for severe pain. 04/06/21  Yes Aline August, MD  pantoprazole (PROTONIX) 40 MG tablet Take 1 tablet (40 mg total) by mouth daily. 04/07/21  Yes Aline August, MD  polyethylene glycol (MIRALAX / GLYCOLAX) 17 g packet Take 17 g by mouth daily as needed. Patient taking differently: Take 17 g by mouth daily as needed for moderate constipation. 04/06/21  Yes Aline August, MD  senna-docusate (SENOKOT-S) 8.6-50 MG tablet Take 1 tablet by mouth 2 (two) times daily. 04/06/21  Yes Aline August, MD  anastrozole (ARIMIDEX) 1 MG tablet Take 1 tablet (1 mg total) by mouth daily. 11/06/20    Nicholas Lose, MD    Allergies    Patient has no known allergies.  Review of Systems   Review of Systems  Neurological:  Positive for syncope.  All other systems reviewed and are negative.  Physical Exam Updated Vital Signs BP 120/67    Pulse 79    Temp 98.9 F (37.2 C)    Resp 20    SpO2 97%   Physical Exam Vitals and nursing note reviewed.  Constitutional:      General: She is not in acute distress.    Appearance: Normal appearance. She is well-developed and underweight.  HENT:     Head: Normocephalic and atraumatic.     Right Ear: Hearing normal.     Left Ear: Hearing normal.     Nose: Nose normal.  Eyes:     Conjunctiva/sclera: Conjunctivae normal.     Pupils: Pupils are equal, round, and reactive to light.  Cardiovascular:     Rate and Rhythm: Regular rhythm.     Heart sounds: S1 normal and S2 normal. No murmur heard.   No friction rub. No gallop.  Pulmonary:     Effort: Pulmonary effort is normal. No respiratory distress.     Breath sounds: Normal breath sounds.  Chest:     Chest wall: No tenderness.  Abdominal:     General: Bowel sounds are normal.     Palpations: Abdomen is soft.     Tenderness: There is no abdominal tenderness. There is no guarding or rebound. Negative signs include Murphy's sign and McBurney's sign.     Hernia: No hernia is present.  Musculoskeletal:        General: Normal range of motion.     Cervical back: Normal range of motion and neck supple.  Skin:    General: Skin is warm and dry.     Findings: No rash.  Neurological:     Mental Status: She is alert and oriented to person, place, and time.     GCS: GCS eye subscore is 4. GCS verbal subscore is 5. GCS motor subscore is 6.     Cranial Nerves: No cranial nerve deficit.     Sensory: No sensory deficit.     Coordination: Coordination normal.  Psychiatric:        Speech: Speech normal.        Behavior: Behavior normal.        Thought Content: Thought content normal.    ED  Results / Procedures / Treatments   Labs (all labs ordered are listed, but only abnormal results are displayed) Labs Reviewed  CBC WITH DIFFERENTIAL/PLATELET - Abnormal; Notable for the following components:      Result Value   Hemoglobin 10.6 (*)    HCT 31.5 (*)    MCV 75.9 (*)    MCH 25.5 (*)  RDW 16.9 (*)    All other components within normal limits  COMPREHENSIVE METABOLIC PANEL - Abnormal; Notable for the following components:   Sodium 133 (*)    Glucose, Bld 104 (*)    Creatinine, Ser 1.07 (*)    Calcium 8.7 (*)    Albumin 2.5 (*)    All other components within normal limits  URINALYSIS, ROUTINE W REFLEX MICROSCOPIC - Abnormal; Notable for the following components:   APPearance HAZY (*)    Hgb urine dipstick SMALL (*)    Ketones, ur 5 (*)    Protein, ur 30 (*)    Leukocytes,Ua TRACE (*)    Bacteria, UA RARE (*)    All other components within normal limits  RESP PANEL BY RT-PCR (FLU A&B, COVID) ARPGX2  BRAIN NATRIURETIC PEPTIDE  TYPE AND SCREEN  TROPONIN I (HIGH SENSITIVITY)  TROPONIN I (HIGH SENSITIVITY)    EKG None  Radiology CT HEAD WO CONTRAST (5MM)  Result Date: 04/17/2021 CLINICAL DATA:  Multiple syncopal episodes. EXAM: CT HEAD WITHOUT CONTRAST TECHNIQUE: Contiguous axial images were obtained from the base of the skull through the vertex without intravenous contrast. COMPARISON:  April 04, 2011 FINDINGS: Brain: There is mild cerebral atrophy with widening of the extra-axial spaces and ventricular dilatation. There are areas of decreased attenuation within the white matter tracts of the supratentorial brain, consistent with microvascular disease changes. Vascular: No hyperdense vessel or unexpected calcification. Skull: Normal. Negative for fracture or focal lesion. Sinuses/Orbits: There is moderate to marked severity bilateral ethmoid sinus mucosal thickening. Other: None. IMPRESSION: 1. Generalized cerebral atrophy. 2. No acute intracranial abnormality. 3.  Moderate to marked severity bilateral ethmoid sinus disease. Electronically Signed   By: Virgina Norfolk M.D.   On: 04/17/2021 01:37   DG Chest Port 1 View  Result Date: 04/17/2021 CLINICAL DATA:  Syncope. EXAM: PORTABLE CHEST 1 VIEW COMPARISON:  April 06, 2021 FINDINGS: The right internal jugular venous catheter seen on the prior study has been removed. A trace amount of linear atelectasis is noted within the right lung base. There is no evidence of acute infiltrate, pleural effusion or pneumothorax. The heart size and mediastinal contours are within normal limits. Radiopaque surgical clips are seen along the lateral aspect of the mid left chest wall. The visualized skeletal structures are unremarkable. IMPRESSION: No acute or active cardiopulmonary disease. Electronically Signed   By: Virgina Norfolk M.D.   On: 04/17/2021 01:23    Procedures Procedures   Medications Ordered in ED Medications  sodium chloride 0.9 % bolus 1,000 mL (1,000 mLs Intravenous New Bag/Given 04/17/21 0115)    ED Course  I have reviewed the triage vital signs and the nursing notes.  Pertinent labs & imaging results that were available during my care of the patient were reviewed by me and considered in my medical decision making (see chart for details).    MDM Rules/Calculators/A&P                         Patient presents to the emergency department for evaluation of syncopal episode.  Patient recently diagnosed with pericarditis with pericardial effusion requiring pericardial window.  Patient not exhibiting pericardial benign physiology.  Syncope appears to be positional and she appears to be orthostatic.  Blood pressure is low at arrival, did have a syncopal episode during triage.  Patient administered IV fluids and blood pressures and vital signs all improved.  No acute kidney injury.  Troponins negative.  Based on  her recent history, however, will require observation, possible repeat echo.      Final  Clinical Impression(s) / ED Diagnoses Final diagnoses:  Syncope, unspecified syncope type    Rx / DC Orders ED Discharge Orders     None        Tyreek Clabo, Gwenyth Allegra, MD 04/17/21 [phone]

## 2021-04-17 NOTE — ED Notes (Signed)
Pt assisted on and off bed pan.

## 2021-04-17 NOTE — Progress Notes (Addendum)
Reviewed lab work which noted D-dimer was significantly elevated to 5.36.  Suspect that this could be reactive in the setting of pericarditis with pleural effusion status post pericardial window.  However, also question possibility of blood clot as cause of patient's syncopal episodes.  Orders placed to check CT angiogram of the chest to rule out possibility of a PE.  Procalcitonin was also noted to be elevated at 2.07 with a CRP 4.3.  This was given concern for possible underlying infection, but patient had already been started on empiric antibiotics of Rocephin for the possibility of UTI.  CTA of the chest did not show any signs of a pulmonary embolus, but did note early signs of possible pneumonia.  May consider extending antibiotic course past 3 days and adding on a to coverage if needed.  Did initially hold ibuprofen due to AKI.  Consider discussing with cardiology in a.m

## 2021-04-17 NOTE — ED Notes (Signed)
Lunch Ordered °

## 2021-04-17 NOTE — ED Notes (Signed)
XR at bedside

## 2021-04-18 ENCOUNTER — Encounter (HOSPITAL_COMMUNITY): Payer: Self-pay | Admitting: Internal Medicine

## 2021-04-18 LAB — BASIC METABOLIC PANEL
Anion gap: 7 (ref 5–15)
BUN: 13 mg/dL (ref 8–23)
CO2: 22 mmol/L (ref 22–32)
Calcium: 7.6 mg/dL — ABNORMAL LOW (ref 8.9–10.3)
Chloride: 103 mmol/L (ref 98–111)
Creatinine, Ser: 0.77 mg/dL (ref 0.44–1.00)
GFR, Estimated: >60 mL/min (ref >60)
Glucose, Bld: 87 mg/dL (ref 70–99)
Potassium: 2.7 mmol/L — CL (ref 3.5–5.1)
Sodium: 132 mmol/L — ABNORMAL LOW (ref 135–145)

## 2021-04-18 LAB — URINE CULTURE: Special Requests: NORMAL

## 2021-04-18 LAB — CBC
HCT: 24.9 % — ABNORMAL LOW (ref 36.0–46.0)
Hemoglobin: 8.4 g/dL — ABNORMAL LOW (ref 12.0–15.0)
MCH: 26 pg (ref 26.0–34.0)
MCHC: 33.7 g/dL (ref 30.0–36.0)
MCV: 77.1 fL — ABNORMAL LOW (ref 80.0–100.0)
Platelets: 374 10*3/uL (ref 150–400)
RBC: 3.23 MIL/uL — ABNORMAL LOW (ref 3.87–5.11)
RDW: 16.4 % — ABNORMAL HIGH (ref 11.5–15.5)
WBC: 4.9 10*3/uL (ref 4.0–10.5)
nRBC: 0 % (ref 0.0–0.2)

## 2021-04-18 LAB — MAGNESIUM: Magnesium: 1.6 mg/dL — ABNORMAL LOW (ref 1.7–2.4)

## 2021-04-18 LAB — PHOSPHORUS: Phosphorus: 2.5 mg/dL (ref 2.5–4.6)

## 2021-04-18 MED ORDER — POTASSIUM CHLORIDE CRYS ER 20 MEQ PO TBCR
40.0000 meq | EXTENDED_RELEASE_TABLET | ORAL | Status: AC
  Administered 2021-04-18 (×2): 40 meq via ORAL
  Filled 2021-04-18 (×2): qty 2

## 2021-04-18 MED ORDER — MAGNESIUM SULFATE 2 GM/50ML IV SOLN
2.0000 g | Freq: Once | INTRAVENOUS | Status: AC
Start: 2021-04-18 — End: 2021-04-18
  Administered 2021-04-18: 07:00:00 2 g via INTRAVENOUS
  Filled 2021-04-18: qty 50

## 2021-04-18 MED ORDER — POTASSIUM CHLORIDE CRYS ER 20 MEQ PO TBCR
40.0000 meq | EXTENDED_RELEASE_TABLET | ORAL | Status: DC
  Administered 2021-04-18: 07:00:00 40 meq via ORAL
  Filled 2021-04-18: qty 2

## 2021-04-18 NOTE — Progress Notes (Signed)
Initial Nutrition Assessment  DOCUMENTATION CODES:  Not applicable  INTERVENTION:  Continue Ensure BID.  Continue MVI with minerals daily.  Encourage PO and supplement intake.  NUTRITION DIAGNOSIS:  Increased nutrient needs related to chronic illness as evidenced by estimated needs.  GOAL:  Patient will meet greater than or equal to 90% of their needs  MONITOR:  PO intake, Supplement acceptance, Labs, Weight trends, Skin, I & O's  REASON FOR ASSESSMENT:  Malnutrition Screening Tool    ASSESSMENT:  80 yo female with a PMH of asthma, cancer, HTN, stroke, recent flu, recent admission 12/1-12/17 for pericardial effusion felt to be secondary to post influenza complicated by tamponade status post VATS and pericardial window presents back to the hospital with dizziness and weakness. COVID-19 was positive.  Repeat COVID-19 was positive. Admitted with syncope.  RD working remotely. Attempted to call patient's room phone. Pt did not answer.  Dizziness lightheadedness, presyncopal episode secondary to dehydration, orthostatic drop in systolic blood pressure. Hypokalemia and hypomagnesemia due to poor intake.  Per Epic, pt ate 100% of breakfast this morning.  Per Epic, pt has lost ~10 lbs (10%) in the past 5 months, which is significant and severe for the time frame.  RD to increase Ensure from BID to TID.  Pt likely still malnourished to a severe degree but RD cannot definitively diagnose at this time.  Pt seen by another RD just during previous admission.  Height appears to be incorrect on patient's chart this admission. Last admission, height was stated at 5'1".  Supplements: Ensure BID  Medications: reviewed; Vitamin C, MVI with minerals, Protonix, zinc sulfate  Labs: reviewed; Na 132 (L), K 2.7 (L), Mag 1.6 (L)  NUTRITION - FOCUSED PHYSICAL EXAM: Unable to perform - defer to follow-up  Diet Order:   Diet Order             Diet Heart Room service appropriate? Yes;  Fluid consistency: Thin  Diet effective now                  EDUCATION NEEDS:  No education needs have been identified at this time  Skin:  Skin Assessment: Skin Integrity Issues: Skin Integrity Issues:: Incisions Incisions: R chest, closed  Last BM:  04/18/21 - Type 5, small  Height:  Ht Readings from Last 1 Encounters:  04/17/21 4' (1.219 m)   Weight:  Wt Readings from Last 1 Encounters:  04/17/21 42.6 kg   BMI:  Body mass index is 28.66 kg/m.  Estimated Nutritional Needs:  Kcal:  1450-1650 Protein:  75-90 grams Fluid:  >1.5 L  Derrel Nip, RD, LDN (she/her/hers) Clinical Inpatient Dietitian RD Pager/After-Hours/Weekend Pager # in [place]

## 2021-04-18 NOTE — Progress Notes (Signed)
PROGRESS NOTE    Nicole Rush  KGM:[id number] DOB: 05-12-1940 DOA: 04/17/2021 PCP: Lin Landsman, MD    Brief Narrative:   80 year old with history of hypertension, stroke, breast cancer status postlumpectomy and GERD, recent admission 12/1-12/17 for pericardial effusion felt to be secondary to post influenza complicated by tamponade status post VATS and pericardial window presents back to the hospital with dizziness and weakness. She was found with orthostatic hypotension, potassium 2.7, magnesium 1.6.  CTA of the chest was done because of elevated D-dimer and was negative for PE, did show right middle lobe pneumonia.  UA was infected.  COVID-19 was positive.  Repeat COVID-19 was positive.  Assessment & Plan:   Principal Problem:   Syncope Active Problems:   Protein calorie malnutrition (HCC)   AKI (acute kidney injury) ([place])   COVID-19 virus infection   History of pericarditis   Microcytic hypochromic anemia   Hyponatremia   Acute lower UTI   Dehydration  Dizziness lightheadedness, presyncopal episode secondary to dehydration, orthostatic drop in systolic blood pressure.  Hypokalemia and hypomagnesemia due to poor intake. Treated with IV fluids, orthostatics improved.  Continue mobilizing.  Not on any antihypertensives.  PT OT. Aggressive replacement of potassium and magnesium.  Recheck tomorrow morning.  COVID-19 infection: No evidence of hypoxemia.  Symptomatic treatment with albuterol, antitussive, vitamin C and zinc.  History of pericarditis and pericardial effusion: Status post VATS for cardiac tamponade.  Continue colchicine.  Echocardiogram without evidence of pericardial effusion.  Suspected UTI: On Rocephin.  Continue until final cultures.  GERD: On PPI.  Continue.   DVT prophylaxis: enoxaparin (LOVENOX) injection 30 mg Start: 04/17/21 1000   Code Status: Full code Family Communication: Granddaughter on the phone Disposition Plan: Status is:  Inpatient  Remains inpatient appropriate because: IV antibiotics, severe electrolyte abnormalities         Consultants:  None  Procedures:  None  Antimicrobials:  Rocephin 12/28---   Subjective: Patient seen and examined.  Denies any complaints.  She will go to her granddaughter's house.  She will not go to a skilled nursing rehab.  Objective: Vitals:   04/17/21 2325 04/18/21 0324 04/18/21 0730 04/18/21 1045  BP:  95/73 118/89 125/60  Pulse:  79 79 94  Resp:  18 20 19   Temp:  99.5 F (37.5 C) 98.9 F (37.2 C) 99.6 F (37.6 C)  TempSrc:  Oral Oral Oral  SpO2:  95% 97% 97%  Weight: 42.6 kg     Height: 4' (1.219 m)       Intake/Output Summary (Last 24 hours) at 04/18/2021 1311 Last data filed at 04/18/2021 1221 Gross per 24 hour  Intake 1870.28 ml  Output 550 ml  Net 1320.28 ml   Filed Weights   04/17/21 2325  Weight: 42.6 kg    Examination:  General exam: Appears calm and comfortable  Respiratory system: Clear to auscultation. Respiratory effort normal. Cardiovascular system: S1 & S2 heard, RRR.  No edema.   Gastrointestinal system: Abdomen is nondistended, soft and nontender. No organomegaly or masses felt. Normal bowel sounds heard. Central nervous system: Alert and oriented. No focal neurological deficits. Extremities: Symmetric 5 x 5 power. Skin: No rashes, lesions or ulcers Psychiatry: Judgement and insight appear normal. Mood & affect appropriate.     Data Reviewed: I have personally reviewed following labs and imaging studies  CBC: Recent Labs  Lab 04/17/21 0103 04/18/21 0427  WBC 7.7 4.9  NEUTROABS 6.1  --   HGB 10.6* 8.4*  HCT 31.5*  24.9*  MCV 75.9* 77.1*  PLT PLATELET CLUMPS NOTED ON SMEAR, UNABLE TO ESTIMATE 564   Basic Metabolic Panel: Recent Labs  Lab 04/17/21 0103 04/17/21 1006 04/18/21 0427  NA 133*  --  132*  K 3.6  --  2.7*  CL 98  --  103  CO2 22  --  22  GLUCOSE 104*  --  87  BUN 23  --  13  CREATININE 1.07*   --  0.77  CALCIUM 8.7*  --  7.6*  MG  --   --  1.6*  PHOS  --  3.0 2.5   GFR: Estimated Creatinine Clearance: 23.8 mL/min (by C-G formula based on SCr of 0.77 mg/dL). Liver Function Tests: Recent Labs  Lab 04/17/21 0103  AST 41  ALT 23  ALKPHOS 71  BILITOT 0.7  PROT 6.7  ALBUMIN 2.5*   No results for input(s): LIPASE, AMYLASE in the last 168 hours. No results for input(s): AMMONIA in the last 168 hours. Coagulation Profile: No results for input(s): INR, PROTIME in the last 168 hours. Cardiac Enzymes: No results for input(s): CKTOTAL, CKMB, CKMBINDEX, TROPONINI in the last 168 hours. BNP (last 3 results) No results for input(s): PROBNP in the last 8760 hours. HbA1C: No results for input(s): HGBA1C in the last 72 hours. CBG: No results for input(s): GLUCAP in the last 168 hours. Lipid Profile: No results for input(s): CHOL, HDL, LDLCALC, TRIG, CHOLHDL, LDLDIRECT in the last 72 hours. Thyroid Function Tests: No results for input(s): TSH, T4TOTAL, FREET4, T3FREE, THYROIDAB in the last 72 hours. Anemia Panel: No results for input(s): VITAMINB12, FOLATE, FERRITIN, TIBC, IRON, RETICCTPCT in the last 72 hours. Sepsis Labs: Recent Labs  Lab 04/17/21 1006  PROCALCITON 2.07    Recent Results (from the past 240 hour(s))  Resp Panel by RT-PCR (Flu A&B, Covid) Nasopharyngeal Swab     Status: Abnormal   Collection Time: 04/17/21  6:20 AM   Specimen: Nasopharyngeal Swab; Nasopharyngeal(NP) swabs in vial transport medium  Result Value Ref Range Status   SARS Coronavirus 2 by RT PCR POSITIVE (A) NEGATIVE Final    Comment: (NOTE) SARS-CoV-2 target nucleic acids are DETECTED.  The SARS-CoV-2 RNA is generally detectable in upper respiratory specimens during the acute phase of infection. Positive results are indicative of the presence of the identified virus, but do not rule out bacterial infection or co-infection with other pathogens not detected by the test. Clinical correlation  with patient history and other diagnostic information is necessary to determine patient infection status. The expected result is Negative.  Fact Sheet for Patients: EntrepreneurPulse.com.au  Fact Sheet for Healthcare Providers: IncredibleEmployment.be  This test is not yet approved or cleared by the [place] FDA and  has been authorized for detection and/or diagnosis of SARS-CoV-2 by FDA under an Emergency Use Authorization (EUA).  This EUA will remain in effect (meaning this test can be used) for the duration of  the COVID-19 declaration under Section 564(b)(1) of the A ct, 21 U.S.C. section 360bbb-3(b)(1), unless the authorization is terminated or revoked sooner.     Influenza A by PCR NEGATIVE NEGATIVE Final   Influenza B by PCR NEGATIVE NEGATIVE Final    Comment: (NOTE) The Xpert Xpress SARS-CoV-2/FLU/RSV plus assay is intended as an aid in the diagnosis of influenza from Nasopharyngeal swab specimens and should not be used as a sole basis for treatment. Nasal washings and aspirates are unacceptable for Xpert Xpress SARS-CoV-2/FLU/RSV testing.  Fact Sheet for Patients: EntrepreneurPulse.com.au  Fact Sheet  for Healthcare Providers: IncredibleEmployment.be  This test is not yet approved or cleared by the [place] and has been authorized for detection and/or diagnosis of SARS-CoV-2 by FDA under an Emergency Use Authorization (EUA). This EUA will remain in effect (meaning this test can be used) for the duration of the COVID-19 declaration under Section 564(b)(1) of the Act, 21 U.S.C. section 360bbb-3(b)(1), unless the authorization is terminated or revoked.  Performed at [facility], [place] [address], [place], [place] [address]   Urine Culture     Status: Abnormal   Collection Time: 04/17/21  8:13 AM   Specimen: Urine, Clean Catch  Result Value Ref Range Status   Specimen  Description URINE, CLEAN CATCH  Final   Special Requests   Final    Normal Performed at [facility], [place] [address], [place], [place] [address]    Culture MULTIPLE SPECIES PRESENT, SUGGEST RECOLLECTION (A)  Final   Report Status 04/18/2021 FINAL  Final  Resp Panel by RT-PCR (Flu A&B, Covid) Nasopharyngeal Swab     Status: Abnormal   Collection Time: 04/17/21 10:06 AM   Specimen: Nasopharyngeal Swab; Nasopharyngeal(NP) swabs in vial transport medium  Result Value Ref Range Status   SARS Coronavirus 2 by RT PCR POSITIVE (A) NEGATIVE Final    Comment: (NOTE) SARS-CoV-2 target nucleic acids are DETECTED.  The SARS-CoV-2 RNA is generally detectable in upper respiratory specimens during the acute phase of infection. Positive results are indicative of the presence of the identified virus, but do not rule out bacterial infection or co-infection with other pathogens not detected by the test. Clinical correlation with patient history and other diagnostic information is necessary to determine patient infection status. The expected result is Negative.  Fact Sheet for Patients: EntrepreneurPulse.com.au  Fact Sheet for Healthcare Providers: IncredibleEmployment.be  This test is not yet approved or cleared by the [place] FDA and  has been authorized for detection and/or diagnosis of SARS-CoV-2 by FDA under an Emergency Use Authorization (EUA).  This EUA will remain in effect (meaning this test can be used) for the duration of  the COVID-19 declaration under Section 564(b)(1) of the A ct, 21 U.S.C. section 360bbb-3(b)(1), unless the authorization is terminated or revoked sooner.     Influenza A by PCR NEGATIVE NEGATIVE Final   Influenza B by PCR NEGATIVE NEGATIVE Final    Comment: (NOTE) The Xpert Xpress SARS-CoV-2/FLU/RSV plus assay is intended as an aid in the diagnosis of influenza from Nasopharyngeal swab specimens and should not be used  as a sole basis for treatment. Nasal washings and aspirates are unacceptable for Xpert Xpress SARS-CoV-2/FLU/RSV testing.  Fact Sheet for Patients: EntrepreneurPulse.com.au  Fact Sheet for Healthcare Providers: IncredibleEmployment.be  This test is not yet approved or cleared by the [place] FDA and has been authorized for detection and/or diagnosis of SARS-CoV-2 by FDA under an Emergency Use Authorization (EUA). This EUA will remain in effect (meaning this test can be used) for the duration of the COVID-19 declaration under Section 564(b)(1) of the Act, 21 U.S.C. section 360bbb-3(b)(1), unless the authorization is terminated or revoked.  Performed at [facility], [place] [address] [place], [place] [address]          Radiology Studies: CT HEAD WO CONTRAST (5MM)  Result Date: 04/17/2021 CLINICAL DATA:  Multiple syncopal episodes. EXAM: CT HEAD WITHOUT CONTRAST TECHNIQUE: Contiguous axial images were obtained from the base of the skull through the vertex without intravenous contrast. COMPARISON:  April 04, 2011 FINDINGS: Brain: There is mild cerebral atrophy with widening of the extra-axial spaces and ventricular dilatation. There are areas of decreased attenuation within the white matter tracts of the supratentorial brain, consistent with microvascular disease changes. Vascular: No hyperdense vessel or unexpected calcification. Skull: Normal. Negative for fracture or focal lesion. Sinuses/Orbits: There is moderate to marked severity bilateral ethmoid sinus mucosal thickening. Other: None. IMPRESSION: 1. Generalized cerebral atrophy. 2. No acute intracranial abnormality. 3. Moderate to marked severity bilateral ethmoid sinus disease. Electronically Signed   By: Virgina Norfolk M.D.   On: 04/17/2021 01:37   CT Angio Chest Pulmonary Embolism (PE) W or WO Contrast  Result Date: 04/17/2021 CLINICAL DATA:  Hypotension and elevated D-dimer  levels. EXAM: CT ANGIOGRAPHY CHEST WITH CONTRAST TECHNIQUE: Multidetector CT imaging of the chest was performed using the standard protocol during bolus administration of intravenous contrast. Multiplanar CT image reconstructions and MIPs were obtained to evaluate the vascular anatomy. CONTRAST:  40mL OMNIPAQUE IOHEXOL 350 MG/ML SOLN COMPARISON:  Chest radiograph from 04/17/2021 and CT chest 03/21/2021 FINDINGS: Cardiovascular: No filling defect is identified in the pulmonary arterial tree to suggest pulmonary embolus. Atherosclerotic calcification of the aortic arch. Trace pericardial effusion, reduced from prior. Stably prominent main pulmonary artery favoring pulmonary arterial hypertension. Mediastinum/Nodes: AP window lymph node 1.0 cm in short axis on image 47 series 5. Lower right paratracheal node 1.1 cm in short axis, image 52 series 5. Left paratracheal node 1.0 cm in short axis, image 46 series 5. Right hilar node 1.5 cm in short axis on image 60 series 5, previously similar. Right infrahilar node 1.0 cm in short axis, image 77 series 5. There is also left infrahilar adenopathy. Lungs/Pleura: Centrilobular emphysema. Increased bandlike and nodular atelectasis in the right middle lobe medially. Indistinct airspace opacity inferiorly in the right middle lobe for example on image 96 series 6. Increased right lower lobe atelectasis along the diaphragm. Mild left lower lobe atelectasis along the diaphragm. Upper Abdomen: Unremarkable Musculoskeletal: Unremarkable Review of the MIP images confirms the above findings. IMPRESSION: 1. No filling defect is identified in the pulmonary arterial tree to suggest pulmonary embolus. 2. Mild mediastinal, hilar, and infrahilar adenopathy is nonspecific and could be reactive or neoplastic. 3. Aortic Atherosclerosis (ICD10-I70.0) and Emphysema (ICD10-J43.9). 4. Increased atelectasis medially in the right middle lobe with some indistinct airspace opacity inferiorly in the  right middle lobe which could reflect early pneumonia. 5. Trace pericardial effusion although improved from prior. 6. Stable prominence of the main pulmonary artery favoring pulmonary arterial hypertension. Electronically Signed   By: Van Clines M.D.   On: 04/17/2021 16:02   DG Chest Port 1 View  Result Date: 04/17/2021 CLINICAL DATA:  Syncope. EXAM: PORTABLE CHEST 1 VIEW COMPARISON:  April 06, 2021 FINDINGS: The right internal jugular venous catheter seen on the prior study has been removed. A trace amount of linear atelectasis is noted within the right lung base. There is no evidence of acute infiltrate, pleural effusion or pneumothorax. The heart size and mediastinal contours are within normal limits. Radiopaque surgical clips are seen along the lateral aspect of the mid left chest wall. The visualized skeletal structures are unremarkable. IMPRESSION: No acute or active cardiopulmonary disease. Electronically Signed   By: Virgina Norfolk M.D.   On: 04/17/2021 01:23   ECHOCARDIOGRAM COMPLETE  Result Date: 04/17/2021    ECHOCARDIOGRAM REPORT   Patient Name:   Nicole Rush Date of Exam: 04/17/2021 Medical Rec #:  [id number]  Height:       61.0 in Accession #:    [id number]         Weight:       89.9 lb Date of Birth:  02-Oct-1940           BSA:          1.345 m Patient Age:    25 years           BP:           114/73 mmHg Patient Gender: F                  HR:           88 bpm. Exam Location:  Inpatient Procedure: 2D Echo, 3D Echo, Cardiac Doppler, Color Doppler and Strain Analysis Indications:    Syncope R55  History:        Patient has prior history of Echocardiogram examinations, most                 recent 04/03/2021. Stroke; Risk Factors:Hypertension. COVID.  Sonographer:    Darlina Sicilian RDCS Referring Phys: [id number] [place]  1. Left ventricular ejection fraction, by estimation, is 65 to 70%. The left ventricle has normal function. The left ventricle has no  regional wall motion abnormalities. Left ventricular diastolic parameters are consistent with Grade I diastolic dysfunction (impaired relaxation). The average left ventricular global longitudinal strain is -16.5 %.  2. Right ventricular systolic function is normal. The right ventricular size is normal. There is moderately elevated pulmonary artery systolic pressure.  3. The mitral valve is normal in structure. No evidence of mitral valve regurgitation. No evidence of mitral stenosis.  4. The aortic valve is tricuspid. There is mild calcification of the aortic valve. There is mild thickening of the aortic valve. Aortic valve regurgitation is not visualized. No aortic stenosis is present.  5. The inferior vena cava is dilated in size with <50% respiratory variability, suggesting right atrial pressure of 15 mmHg. FINDINGS  Left Ventricle: Left ventricular ejection fraction, by estimation, is 65 to 70%. The left ventricle has normal function. The left ventricle has no regional wall motion abnormalities. The average left ventricular global longitudinal strain is -16.5 %. The left ventricular internal cavity size was normal in size. There is no left ventricular hypertrophy. Left ventricular diastolic parameters are consistent with Grade I diastolic dysfunction (impaired relaxation). Normal left ventricular filling pressure. Right Ventricle: The right ventricular size is normal. No increase in right ventricular wall thickness. Right ventricular systolic function is normal. There is moderately elevated pulmonary artery systolic pressure. The tricuspid regurgitant velocity is 2.99 m/s, and with an assumed right atrial pressure of 15 mmHg, the estimated right ventricular systolic pressure is 54.2 mmHg. Left Atrium: Left atrial size was normal in size. Right Atrium: Right atrial size was normal in size. Pericardium: There is no evidence of pericardial effusion. Mitral Valve: The mitral valve is normal in structure. No evidence  of mitral valve regurgitation. No evidence of mitral valve stenosis. Tricuspid Valve: The tricuspid valve is normal in structure. Tricuspid valve regurgitation is mild . No evidence of tricuspid stenosis. Aortic Valve: The aortic valve is tricuspid. There is mild calcification of the aortic valve. There is mild thickening of the aortic valve. Aortic valve regurgitation is not visualized. No aortic stenosis is present. Pulmonic Valve: The pulmonic valve was normal in structure. Pulmonic valve regurgitation is not visualized. No evidence of pulmonic stenosis. Aorta: The aortic root  is normal in size and structure. Venous: The inferior vena cava is dilated in size with less than 50% respiratory variability, suggesting right atrial pressure of 15 mmHg. IAS/Shunts: No atrial level shunt detected by color flow Doppler.  LEFT VENTRICLE PLAX 2D LVIDd:         3.60 cm   Diastology LVIDs:         2.30 cm   LV e' medial:    8.59 cm/s LV PW:         0.90 cm   LV E/e' medial:  8.6 LV IVS:        1.10 cm   LV e' lateral:   7.29 cm/s LVOT diam:     2.10 cm   LV E/e' lateral: 10.2 LV SV:         70 LV SV Index:   52        2D Longitudinal Strain LVOT Area:     3.46 cm  2D Strain GLS (A2C):   -16.6 %                          2D Strain GLS (A3C):   -15.3 %                          2D Strain GLS (A4C):   -17.6 %                          2D Strain GLS Avg:     -16.5 %                           3D Volume EF:                          3D EF:        70 %                          LV EDV:       70 ml                          LV ESV:       21 ml                          LV SV:        49 ml RIGHT VENTRICLE RV S prime:     17.80 cm/s TAPSE (M-mode): 1.6 cm LEFT ATRIUM             Index        RIGHT ATRIUM           Index LA diam:        2.70 cm 2.01 cm/m   RA Area:     11.60 cm LA Vol (A2C):   37.9 ml 28.18 ml/m  RA Volume:   20.00 ml  14.87 ml/m LA Vol (A4C):   27.2 ml 20.22 ml/m LA Biplane Vol: 32.8 ml 24.39 ml/m  AORTIC VALVE LVOT  Vmax:   110.00 cm/s LVOT Vmean:  66.400 cm/s LVOT VTI:    0.202 m  AORTA Ao Root diam: 2.90 cm Ao Asc diam:  2.90 cm MITRAL VALVE  TRICUSPID VALVE MV Area (PHT): 3.58 cm    TR Peak grad:   35.8 mmHg MV Decel Time: 212 msec    TR Vmax:        299.00 cm/s MV E velocity: 74.10 cm/s MV A velocity: 80.10 cm/s  SHUNTS MV E/A ratio:  0.93        Systemic VTI:  0.20 m                            Systemic Diam: 2.10 cm Skeet Latch MD Electronically signed by Skeet Latch MD Signature Date/Time: 04/17/2021/4:05:38 PM    Final         Scheduled Meds:  vitamin C  500 mg Oral Daily   colchicine  0.6 mg Oral Daily   enoxaparin (LOVENOX) injection  30 mg Subcutaneous Q24H   feeding supplement  237 mL Oral BID BM   loratadine  10 mg Oral Daily   mirabegron ER  25 mg Oral Daily   multivitamin with minerals  1 tablet Oral Daily   pantoprazole  40 mg Oral Daily   potassium chloride  40 mEq Oral Q4H   saccharomyces boulardii  250 mg Oral BID   sodium chloride flush  3 mL Intravenous Q12H   zinc sulfate  220 mg Oral Daily   Continuous Infusions:  cefTRIAXone (ROCEPHIN)  IV 2 g (04/18/21 0909)     LOS: 1 day    Time spent: 30 minutes    Barb Merino, MD [facility] Pager [phone]

## 2021-04-19 ENCOUNTER — Telehealth: Payer: Self-pay | Admitting: Thoracic Surgery (Cardiothoracic Vascular Surgery)

## 2021-04-19 ENCOUNTER — Other Ambulatory Visit: Payer: Self-pay

## 2021-04-19 LAB — BASIC METABOLIC PANEL
Anion gap: 6 (ref 5–15)
BUN: 8 mg/dL (ref 8–23)
CO2: 23 mmol/L (ref 22–32)
Calcium: 7.9 mg/dL — ABNORMAL LOW (ref 8.9–10.3)
Chloride: 105 mmol/L (ref 98–111)
Creatinine, Ser: 0.6 mg/dL (ref 0.44–1.00)
GFR, Estimated: >60 mL/min (ref >60)
Glucose, Bld: 86 mg/dL (ref 70–99)
Potassium: 3.6 mmol/L (ref 3.5–5.1)
Sodium: 134 mmol/L — ABNORMAL LOW (ref 135–145)

## 2021-04-19 LAB — MAGNESIUM: Magnesium: 1.9 mg/dL (ref 1.7–2.4)

## 2021-04-19 LAB — PHOSPHORUS: Phosphorus: 2.4 mg/dL — ABNORMAL LOW (ref 2.5–4.6)

## 2021-04-19 MED ORDER — LACTATED RINGERS IV BOLUS
500.0000 mL | Freq: Once | INTRAVENOUS | Status: AC
  Administered 2021-04-19: 04:00:00 500 mL via INTRAVENOUS

## 2021-04-19 NOTE — Progress Notes (Signed)
PROGRESS NOTE    Nicole Rush  FKC:[id number] DOB: [birth year] DOA: 04/17/2021 PCP: Lin Landsman, MD    Brief Narrative:  80 year old with history of hypertension, stroke, breast cancer status postlumpectomy and GERD, recent admission 12/1-12/17 for pericardial effusion felt to be secondary to post influenza complicated by tamponade status post VATS and pericardial window presents back to the hospital with dizziness and weakness. She was found with orthostatic hypotension, potassium 2.7, magnesium 1.6.  CTA of the chest was done because of elevated D-dimer and was negative for PE, did show right middle lobe pneumonia.  UA was infected.  COVID-19 was positive.  Repeat COVID-19 was positive.  Assessment & Plan:   Principal Problem:   Syncope Active Problems:   Protein calorie malnutrition (HCC)   AKI (acute kidney injury) ([place])   COVID-19 virus infection   History of pericarditis   Microcytic hypochromic anemia   Hyponatremia   Acute lower UTI   Dehydration  Dizziness and lightheadedness, presyncopal episode secondary to dehydration, orthostatic drop in systolic blood pressure.  Hypokalemia and hypomagnesemia due to poor intake. Treated with IV fluids, orthostatics improved.  Continue mobilizing.  Not on any antihypertensives.  PT OT. Aggressive replacement of potassium and magnesium with improvement.  COVID-19 infection: No evidence of hypoxemia.  Symptomatic treatment with albuterol, antitussive, vitamin C and zinc.  History of pericarditis and pericardial effusion: Status post VATS for cardiac tamponade.  Continue colchicine.  Echocardiogram without evidence of pericardial effusion.  Suspected UTI: Multiple flora.  Rocephin day 3 today.  Discontinue.  GERD: On PPI.  Continue.   DVT prophylaxis: enoxaparin (LOVENOX) injection 30 mg Start: 04/17/21 1000   Code Status: Full code Family Communication: Granddaughter on the phone Disposition Plan: Status is:  Inpatient  Remains inpatient appropriate because: IV antibiotics.  Electrolyte analysis.  Fever.         Consultants:  None  Procedures:  None  Antimicrobials:  Rocephin 12/28---   Subjective:  Patient seen and examined.  Temperature 103 last night and some mucoid sputum production.  Denies any other complaints.  Denies any chest pain or shortness of breath.  She started walking with a physical therapist and did very well with her walker.  Eager to go home.  Objective: Vitals:   04/19/21 0400 04/19/21 0618 04/19/21 1019 04/19/21 1131  BP:  (!) 95/59 118/64 117/71  Pulse: 74 77 86 84  Resp: (!) 23 20  (!) 21  Temp:  98.8 F (37.1 C)  98.7 F (37.1 C)  TempSrc:  Oral  Oral  SpO2: 94% 95% 99% 98%  Weight:  41.2 kg    Height:        Intake/Output Summary (Last 24 hours) at 04/19/2021 1256 Last data filed at 04/19/2021 0532 Gross per 24 hour  Intake 740.89 ml  Output 625 ml  Net 115.89 ml   Filed Weights   04/17/21 2325 04/19/21 0618  Weight: 42.6 kg 41.2 kg    Examination:  General: Looks fairly comfortable.  Not in distress.  On room air.  Walking around with a walker. Cardiovascular: S1-S2 normal.  Regular rate rhythm. Respiratory: Bilateral clear.  No added sounds.  Some conducted sounds. Gastrointestinal: Soft.  Nontender.  Bowel sound present. Ext: No cyanosis.  No edema. Neuro: No focal deficits.     Data Reviewed: I have personally reviewed following labs and imaging studies  CBC: Recent Labs  Lab 04/17/21 0103 04/18/21 0427  WBC 7.7 4.9  NEUTROABS 6.1  --   HGB  10.6* 8.4*  HCT 31.5* 24.9*  MCV 75.9* 77.1*  PLT PLATELET CLUMPS NOTED ON SMEAR, UNABLE TO ESTIMATE 170   Basic Metabolic Panel: Recent Labs  Lab 04/17/21 0103 04/17/21 1006 04/18/21 0427 04/19/21 0456  NA 133*  --  132* 134*  K 3.6  --  2.7* 3.6  CL 98  --  103 105  CO2 22  --  22 23  GLUCOSE 104*  --  87 86  BUN 23  --  13 8  CREATININE 1.07*  --  0.77 0.60   CALCIUM 8.7*  --  7.6* 7.9*  MG  --   --  1.6* 1.9  PHOS  --  3.0 2.5 2.4*   GFR: Estimated Creatinine Clearance: 23.3 mL/min (by C-G formula based on SCr of 0.6 mg/dL). Liver Function Tests: Recent Labs  Lab 04/17/21 0103  AST 41  ALT 23  ALKPHOS 71  BILITOT 0.7  PROT 6.7  ALBUMIN 2.5*   No results for input(s): LIPASE, AMYLASE in the last 168 hours. No results for input(s): AMMONIA in the last 168 hours. Coagulation Profile: No results for input(s): INR, PROTIME in the last 168 hours. Cardiac Enzymes: No results for input(s): CKTOTAL, CKMB, CKMBINDEX, TROPONINI in the last 168 hours. BNP (last 3 results) No results for input(s): PROBNP in the last 8760 hours. HbA1C: No results for input(s): HGBA1C in the last 72 hours. CBG: No results for input(s): GLUCAP in the last 168 hours. Lipid Profile: No results for input(s): CHOL, HDL, LDLCALC, TRIG, CHOLHDL, LDLDIRECT in the last 72 hours. Thyroid Function Tests: No results for input(s): TSH, T4TOTAL, FREET4, T3FREE, THYROIDAB in the last 72 hours. Anemia Panel: No results for input(s): VITAMINB12, FOLATE, FERRITIN, TIBC, IRON, RETICCTPCT in the last 72 hours. Sepsis Labs: Recent Labs  Lab 04/17/21 1006  PROCALCITON 2.07    Recent Results (from the past 240 hour(s))  Resp Panel by RT-PCR (Flu A&B, Covid) Nasopharyngeal Swab     Status: Abnormal   Collection Time: 04/17/21  6:20 AM   Specimen: Nasopharyngeal Swab; Nasopharyngeal(NP) swabs in vial transport medium  Result Value Ref Range Status   SARS Coronavirus 2 by RT PCR POSITIVE (A) NEGATIVE Final    Comment: (NOTE) SARS-CoV-2 target nucleic acids are DETECTED.  The SARS-CoV-2 RNA is generally detectable in upper respiratory specimens during the acute phase of infection. Positive results are indicative of the presence of the identified virus, but do not rule out bacterial infection or co-infection with other pathogens not detected by the test. Clinical  correlation with patient history and other diagnostic information is necessary to determine patient infection status. The expected result is Negative.  Fact Sheet for Patients: EntrepreneurPulse.com.au  Fact Sheet for Healthcare Providers: IncredibleEmployment.be  This test is not yet approved or cleared by the [place] FDA and  has been authorized for detection and/or diagnosis of SARS-CoV-2 by FDA under an Emergency Use Authorization (EUA).  This EUA will remain in effect (meaning this test can be used) for the duration of  the COVID-19 declaration under Section 564(b)(1) of the A ct, 21 U.S.C. section 360bbb-3(b)(1), unless the authorization is terminated or revoked sooner.     Influenza A by PCR NEGATIVE NEGATIVE Final   Influenza B by PCR NEGATIVE NEGATIVE Final    Comment: (NOTE) The Xpert Xpress SARS-CoV-2/FLU/RSV plus assay is intended as an aid in the diagnosis of influenza from Nasopharyngeal swab specimens and should not be used as a sole basis for treatment. Nasal washings and  aspirates are unacceptable for Xpert Xpress SARS-CoV-2/FLU/RSV testing.  Fact Sheet for Patients: EntrepreneurPulse.com.au  Fact Sheet for Healthcare Providers: IncredibleEmployment.be  This test is not yet approved or cleared by the [place] FDA and has been authorized for detection and/or diagnosis of SARS-CoV-2 by FDA under an Emergency Use Authorization (EUA). This EUA will remain in effect (meaning this test can be used) for the duration of the COVID-19 declaration under Section 564(b)(1) of the Act, 21 U.S.C. section 360bbb-3(b)(1), unless the authorization is terminated or revoked.  Performed at [facility], [place] [address], [place], [place] [address]   Urine Culture     Status: Abnormal   Collection Time: 04/17/21  8:13 AM   Specimen: Urine, Clean Catch  Result Value Ref Range Status    Specimen Description URINE, CLEAN CATCH  Final   Special Requests   Final    Normal Performed at [facility], [place] [address] [place], [place] [address]    Culture MULTIPLE SPECIES PRESENT, SUGGEST RECOLLECTION (A)  Final   Report Status 04/18/2021 FINAL  Final  Resp Panel by RT-PCR (Flu A&B, Covid) Nasopharyngeal Swab     Status: Abnormal   Collection Time: 04/17/21 10:06 AM   Specimen: Nasopharyngeal Swab; Nasopharyngeal(NP) swabs in vial transport medium  Result Value Ref Range Status   SARS Coronavirus 2 by RT PCR POSITIVE (A) NEGATIVE Final    Comment: (NOTE) SARS-CoV-2 target nucleic acids are DETECTED.  The SARS-CoV-2 RNA is generally detectable in upper respiratory specimens during the acute phase of infection. Positive results are indicative of the presence of the identified virus, but do not rule out bacterial infection or co-infection with other pathogens not detected by the test. Clinical correlation with patient history and other diagnostic information is necessary to determine patient infection status. The expected result is Negative.  Fact Sheet for Patients: EntrepreneurPulse.com.au  Fact Sheet for Healthcare Providers: IncredibleEmployment.be  This test is not yet approved or cleared by the [place] FDA and  has been authorized for detection and/or diagnosis of SARS-CoV-2 by FDA under an Emergency Use Authorization (EUA).  This EUA will remain in effect (meaning this test can be used) for the duration of  the COVID-19 declaration under Section 564(b)(1) of the A ct, 21 U.S.C. section 360bbb-3(b)(1), unless the authorization is terminated or revoked sooner.     Influenza A by PCR NEGATIVE NEGATIVE Final   Influenza B by PCR NEGATIVE NEGATIVE Final    Comment: (NOTE) The Xpert Xpress SARS-CoV-2/FLU/RSV plus assay is intended as an aid in the diagnosis of influenza from Nasopharyngeal swab specimens and should not  be used as a sole basis for treatment. Nasal washings and aspirates are unacceptable for Xpert Xpress SARS-CoV-2/FLU/RSV testing.  Fact Sheet for Patients: EntrepreneurPulse.com.au  Fact Sheet for Healthcare Providers: IncredibleEmployment.be  This test is not yet approved or cleared by the [place] FDA and has been authorized for detection and/or diagnosis of SARS-CoV-2 by FDA under an Emergency Use Authorization (EUA). This EUA will remain in effect (meaning this test can be used) for the duration of the COVID-19 declaration under Section 564(b)(1) of the Act, 21 U.S.C. section 360bbb-3(b)(1), unless the authorization is terminated or revoked.  Performed at [facility], [place] [address], [place], [place] [address]          Radiology Studies: CT Angio Chest Pulmonary Embolism (PE) W or WO Contrast  Result Date: 04/17/2021 CLINICAL DATA:  Hypotension and elevated D-dimer levels. EXAM: CT  ANGIOGRAPHY CHEST WITH CONTRAST TECHNIQUE: Multidetector CT imaging of the chest was performed using the standard protocol during bolus administration of intravenous contrast. Multiplanar CT image reconstructions and MIPs were obtained to evaluate the vascular anatomy. CONTRAST:  73mL OMNIPAQUE IOHEXOL 350 MG/ML SOLN COMPARISON:  Chest radiograph from 04/17/2021 and CT chest 03/21/2021 FINDINGS: Cardiovascular: No filling defect is identified in the pulmonary arterial tree to suggest pulmonary embolus. Atherosclerotic calcification of the aortic arch. Trace pericardial effusion, reduced from prior. Stably prominent main pulmonary artery favoring pulmonary arterial hypertension. Mediastinum/Nodes: AP window lymph node 1.0 cm in short axis on image 47 series 5. Lower right paratracheal node 1.1 cm in short axis, image 52 series 5. Left paratracheal node 1.0 cm in short axis, image 46 series 5. Right hilar node 1.5 cm in short axis on image 60 series 5,  previously similar. Right infrahilar node 1.0 cm in short axis, image 77 series 5. There is also left infrahilar adenopathy. Lungs/Pleura: Centrilobular emphysema. Increased bandlike and nodular atelectasis in the right middle lobe medially. Indistinct airspace opacity inferiorly in the right middle lobe for example on image 96 series 6. Increased right lower lobe atelectasis along the diaphragm. Mild left lower lobe atelectasis along the diaphragm. Upper Abdomen: Unremarkable Musculoskeletal: Unremarkable Review of the MIP images confirms the above findings. IMPRESSION: 1. No filling defect is identified in the pulmonary arterial tree to suggest pulmonary embolus. 2. Mild mediastinal, hilar, and infrahilar adenopathy is nonspecific and could be reactive or neoplastic. 3. Aortic Atherosclerosis (ICD10-I70.0) and Emphysema (ICD10-J43.9). 4. Increased atelectasis medially in the right middle lobe with some indistinct airspace opacity inferiorly in the right middle lobe which could reflect early pneumonia. 5. Trace pericardial effusion although improved from prior. 6. Stable prominence of the main pulmonary artery favoring pulmonary arterial hypertension. Electronically Signed   By: Van Clines M.D.   On: 04/17/2021 16:02   ECHOCARDIOGRAM COMPLETE  Result Date: 04/17/2021    ECHOCARDIOGRAM REPORT   Patient Name:   Nicole Rush Date of Exam: 04/17/2021 Medical Rec #:  [id number]          Height:       61.0 in Accession #:    [id number]         Weight:       89.9 lb Date of Birth:  05-20-1940           BSA:          1.345 m Patient Age:    60 years           BP:           114/73 mmHg Patient Gender: F                  HR:           88 bpm. Exam Location:  Inpatient Procedure: 2D Echo, 3D Echo, Cardiac Doppler, Color Doppler and Strain Analysis Indications:    Syncope R55  History:        Patient has prior history of Echocardiogram examinations, most                 recent 04/03/2021. Stroke; Risk  Factors:Hypertension. COVID.  Sonographer:    Darlina Sicilian RDCS Referring Phys: [id number] [place]  1. Left ventricular ejection fraction, by estimation, is 65 to 70%. The left ventricle has normal function. The left ventricle has no regional wall motion abnormalities. Left ventricular diastolic parameters are consistent with Grade I diastolic dysfunction (impaired  relaxation). The average left ventricular global longitudinal strain is -16.5 %.  2. Right ventricular systolic function is normal. The right ventricular size is normal. There is moderately elevated pulmonary artery systolic pressure.  3. The mitral valve is normal in structure. No evidence of mitral valve regurgitation. No evidence of mitral stenosis.  4. The aortic valve is tricuspid. There is mild calcification of the aortic valve. There is mild thickening of the aortic valve. Aortic valve regurgitation is not visualized. No aortic stenosis is present.  5. The inferior vena cava is dilated in size with <50% respiratory variability, suggesting right atrial pressure of 15 mmHg. FINDINGS  Left Ventricle: Left ventricular ejection fraction, by estimation, is 65 to 70%. The left ventricle has normal function. The left ventricle has no regional wall motion abnormalities. The average left ventricular global longitudinal strain is -16.5 %. The left ventricular internal cavity size was normal in size. There is no left ventricular hypertrophy. Left ventricular diastolic parameters are consistent with Grade I diastolic dysfunction (impaired relaxation). Normal left ventricular filling pressure. Right Ventricle: The right ventricular size is normal. No increase in right ventricular wall thickness. Right ventricular systolic function is normal. There is moderately elevated pulmonary artery systolic pressure. The tricuspid regurgitant velocity is 2.99 m/s, and with an assumed right atrial pressure of 15 mmHg, the estimated right ventricular  systolic pressure is 39.7 mmHg. Left Atrium: Left atrial size was normal in size. Right Atrium: Right atrial size was normal in size. Pericardium: There is no evidence of pericardial effusion. Mitral Valve: The mitral valve is normal in structure. No evidence of mitral valve regurgitation. No evidence of mitral valve stenosis. Tricuspid Valve: The tricuspid valve is normal in structure. Tricuspid valve regurgitation is mild . No evidence of tricuspid stenosis. Aortic Valve: The aortic valve is tricuspid. There is mild calcification of the aortic valve. There is mild thickening of the aortic valve. Aortic valve regurgitation is not visualized. No aortic stenosis is present. Pulmonic Valve: The pulmonic valve was normal in structure. Pulmonic valve regurgitation is not visualized. No evidence of pulmonic stenosis. Aorta: The aortic root is normal in size and structure. Venous: The inferior vena cava is dilated in size with less than 50% respiratory variability, suggesting right atrial pressure of 15 mmHg. IAS/Shunts: No atrial level shunt detected by color flow Doppler.  LEFT VENTRICLE PLAX 2D LVIDd:         3.60 cm   Diastology LVIDs:         2.30 cm   LV e' medial:    8.59 cm/s LV PW:         0.90 cm   LV E/e' medial:  8.6 LV IVS:        1.10 cm   LV e' lateral:   7.29 cm/s LVOT diam:     2.10 cm   LV E/e' lateral: 10.2 LV SV:         70 LV SV Index:   52        2D Longitudinal Strain LVOT Area:     3.46 cm  2D Strain GLS (A2C):   -16.6 %                          2D Strain GLS (A3C):   -15.3 %                          2D Strain GLS (A4C):   -  17.6 %                          2D Strain GLS Avg:     -16.5 %                           3D Volume EF:                          3D EF:        70 %                          LV EDV:       70 ml                          LV ESV:       21 ml                          LV SV:        49 ml RIGHT VENTRICLE RV S prime:     17.80 cm/s TAPSE (M-mode): 1.6 cm LEFT ATRIUM             Index         RIGHT ATRIUM           Index LA diam:        2.70 cm 2.01 cm/m   RA Area:     11.60 cm LA Vol (A2C):   37.9 ml 28.18 ml/m  RA Volume:   20.00 ml  14.87 ml/m LA Vol (A4C):   27.2 ml 20.22 ml/m LA Biplane Vol: 32.8 ml 24.39 ml/m  AORTIC VALVE LVOT Vmax:   110.00 cm/s LVOT Vmean:  66.400 cm/s LVOT VTI:    0.202 m  AORTA Ao Root diam: 2.90 cm Ao Asc diam:  2.90 cm MITRAL VALVE               TRICUSPID VALVE MV Area (PHT): 3.58 cm    TR Peak grad:   35.8 mmHg MV Decel Time: 212 msec    TR Vmax:        299.00 cm/s MV E velocity: 74.10 cm/s MV A velocity: 80.10 cm/s  SHUNTS MV E/A ratio:  0.93        Systemic VTI:  0.20 m                            Systemic Diam: 2.10 cm Skeet Latch MD Electronically signed by Skeet Latch MD Signature Date/Time: 04/17/2021/4:05:38 PM    Final         Scheduled Meds:  vitamin C  500 mg Oral Daily   colchicine  0.6 mg Oral Daily   enoxaparin (LOVENOX) injection  30 mg Subcutaneous Q24H   feeding supplement  237 mL Oral BID BM   loratadine  10 mg Oral Daily   mirabegron ER  25 mg Oral Daily   multivitamin with minerals  1 tablet Oral Daily   pantoprazole  40 mg Oral Daily   saccharomyces boulardii  250 mg Oral BID   sodium chloride flush  3 mL Intravenous Q12H   zinc sulfate  220 mg Oral Daily   Continuous Infusions:     LOS: 2 days  Time spent: 30 minutes    Barb Merino, MD [facility] Pager [phone]

## 2021-04-19 NOTE — Progress Notes (Signed)
Physical Therapy Treatment Patient Details Name: Nicole Rush MRN: [id number] DOB: [birth year] Today's Date: 04/19/2021   History of Present Illness Pt is an 80 y/o female admitted 12/28 secondary to syncope. Also found to be covid+. PMH includes breast cancer, HTN, and CVA.    PT Comments    Pt with much improved mobility. Feel she can return home with [facility] and granddaughter's intermittent assist.    Recommendations for follow up therapy are one component of a multi-disciplinary discharge planning process, led by the attending physician.  Recommendations may be updated based on patient status, additional functional criteria and insurance authorization.  Follow Up Recommendations  Home health PT     Assistance Recommended at Discharge Intermittent Supervision/Assistance  Equipment Recommendations  None recommended by PT    Recommendations for Other Services       Precautions / Restrictions Precautions Precautions: Fall Restrictions Weight Bearing Restrictions: No     Mobility  Bed Mobility Overal bed mobility: Modified Independent Bed Mobility: Supine to Sit     Supine to sit: Modified independent (Device/Increase time)          Transfers Overall transfer level: Needs assistance Equipment used: Rolling walker (2 wheels) Transfers: Sit to/from Stand Sit to Stand: Supervision           General transfer comment: supervision for safety/lines. Verbal cues for hand placement    Ambulation/Gait Ambulation/Gait assistance: Supervision Gait Distance (Feet): 30 Feet (x 2) Assistive device: Rolling walker (2 wheels) Gait Pattern/deviations: Step-through pattern;Decreased stride length Gait velocity: Decreased Gait velocity interpretation: <1.31 ft/sec, indicative of household ambulator   General Gait Details: Assist for Barrister's clerk    Modified Rankin (Stroke Patients Only)       Balance Overall balance  assessment: Needs assistance Sitting-balance support: No upper extremity supported;Feet supported Sitting balance-Leahy Scale: Good     Standing balance support: Bilateral upper extremity supported;Reliant on assistive device for balance Standing balance-Leahy Scale: Poor Standing balance comment: walker and supervision with walker                            Cognition Arousal/Alertness: Awake/alert Behavior During Therapy: WFL for tasks assessed/performed Overall Cognitive Status: Within Functional Limits for tasks assessed                                          Exercises      General Comments General comments (skin integrity, edema, etc.): VSS. SpO2 99% on RA. BP 110's/60's      Pertinent Vitals/Pain Pain Assessment: No/denies pain    Home Living                          Prior Function            PT Goals (current goals can now be found in the care plan section) Acute Rehab PT Goals Patient Stated Goal: to go home Progress towards PT goals: Progressing toward goals    Frequency    Min 3X/week      PT Plan Discharge plan needs to be updated;Frequency needs to be updated    Co-evaluation              AM-PAC PT "6 Clicks" Mobility  Outcome Measure  Help needed turning from your back to your side while in a flat bed without using bedrails?: None Help needed moving from lying on your back to sitting on the side of a flat bed without using bedrails?: None Help needed moving to and from a bed to a chair (including a wheelchair)?: A Little Help needed standing up from a chair using your arms (e.g., wheelchair or bedside chair)?: A Little Help needed to walk in hospital room?: A Little Help needed climbing 3-5 steps with a railing? : A Lot 6 Click Score: 19    End of Session   Activity Tolerance: Patient tolerated treatment well Patient left: in chair;with call bell/phone within reach;with chair alarm set   PT  Visit Diagnosis: Unsteadiness on feet (R26.81);Muscle weakness (generalized) (M62.81);Difficulty in walking, not elsewhere classified (R26.2)     Time: 0071-2197 PT Time Calculation (min) (ACUTE ONLY): 19 min  Charges:  $Gait Training: 8-22 mins                     [place] Pager [phone] Office [place] 04/19/2021, 10:55 AM

## 2021-04-19 NOTE — Progress Notes (Signed)
Mobility Specialist Progress Note:   04/19/21 1603  Mobility  Activity Ambulated in hall  Level of Assistance Standby assist, set-up cues, supervision of patient - no hands on  Assistive Device BSC  Distance Ambulated (ft) 4 ft  Mobility Out of bed for toileting  Mobility Response Tolerated well  Mobility performed by Mobility specialist  $Mobility charge 1 Mobility   Pt received in bed. Pt only wanting to use BSC. Left in bed with call bell in reach and all needs met.   [facility] Public librarian Phone [phone] Secondary Phone [phone]

## 2021-04-19 NOTE — Progress Notes (Signed)
HOSPITAL MEDICINE OVERNIGHT EVENT NOTE    Nursing reporting that patient's blood pressures have begun to trend lower with systolic blood pressures between the 80s and 90s.  Chart reviewed, patient currently being managed for COVID-19 infection with possible concurrent urinary tract infection.  Nursing reports that lungs are clear with no peripheral edema.  Patient not requiring supplemental oxygen.  We will administer a 500 cc lactated ringer solution bolus.  Vernelle Emerald  MD [facility]

## 2021-04-19 NOTE — TOC Progression Note (Signed)
Transition of Care [facility]) - Progression Note    Patient Details  Name: Nicole Rush MRN: [id number] Date of Birth: November 23, 1940  Transition of Care [facility]) CM/SW [place], RN Phone [phone]  04/19/2021, 2:07 PM  Clinical Narrative:    [facility] consulted for DME/ [facility] needs. Patient is to live with Granddaughter Nicole Rush upon d/c . CM spoke with Nicole Rush who confirms that patient has DME (hospital bed) at home and that she was told that walker was on back order. CM has placed call to [employer] to order walker. Per [employer] records patient received wheelchair and rolling walker on 04/08/21 and will not be eligible to receive this DME again. No other needs noted at this time.         Expected Discharge Plan and Services                                                 Social Determinants of Health (SDOH) Interventions    Readmission Risk Interventions No flowsheet data found.

## 2021-04-20 MED ORDER — CEPHALEXIN 500 MG PO CAPS: 500.0000 mg | ORAL_CAPSULE | Freq: Three times a day (TID) | ORAL | 0 refills | Status: AC

## 2021-04-20 NOTE — Discharge Summary (Signed)
Physician Discharge Summary  Nicole Rush XNT:[id number] DOB: 1940-05-28 DOA: 04/17/2021  PCP: Lin Landsman, MD  Admit date: 04/17/2021 Discharge date: 04/20/2021  Admitted From: Home Disposition: Home with home health  Recommendations for Outpatient Follow-up:  Follow up with PCP in 1-2 weeks Please obtain BMP/CBC/magnesium/phosphorus  Home Health: PT Equipment/Devices: None.  Already present at home.  Discharge Condition: Stable CODE STATUS: Full code Diet recommendation: Regular diet.  Discharge summary: 80 year old with history of hypertension, stroke, breast cancer status postlumpectomy and GERD, recent admission 12/1-12/17 for pericardial effusion felt to be secondary to post influenza complicated by tamponade status post VATS and pericardial window presents back to the hospital with dizziness and weakness. She was found with orthostatic hypotension, potassium 2.7, magnesium 1.6.  CTA of the chest was done because of elevated D-dimer and was negative for PE, did show right middle lobe pneumonia.  UA was infected.  COVID-19 was positive. Repeat COVID-19 was positive.   Dizziness and lightheadedness, presyncopal episode secondary to dehydration, orthostatic drop in systolic blood pressure.  Hypokalemia and hypomagnesemia due to poor intake. Treated with IV fluids, orthostatics improved.  Currently asymptomatic.  Mobilizing around without any orthostatic symptoms.  Appetite is fair.  Electrolytes were aggressively replaced with improvement.    COVID-19 infection: No evidence of hypoxemia.  Symptomatic treatment with albuterol, antitussive, vitamin C and zinc. She does have evidence of right middle lobe airway disease, possible superadded bacterial infection.  Received 3 days of Rocephin.  We will continue 2 more days of Keflex.  She does have some dry cough but no other symptoms. Low-grade fever persist but improving than before.   History of pericarditis and pericardial  effusion: Status post VATS for cardiac tamponade.  Continue colchicine.  Echocardiogram without evidence of pericardial effusion.  Discontinue ibuprofen.   Suspected UTI: Multiple flora.  Rocephin day 3 today.  2 more days of Keflex.   GERD: On PPI.  Continue.  Patient adequately improved.  Anticipate her fever curve will gradually improve.  Stable for discharge home with home health PT.  She is going to her granddaughter's house.     Discharge Diagnoses:  Principal Problem:   Syncope Active Problems:   Protein calorie malnutrition (HCC)   AKI (acute kidney injury) ([place])   COVID-19 virus infection   History of pericarditis   Microcytic hypochromic anemia   Hyponatremia   Acute lower UTI   Dehydration    Discharge Instructions  Discharge Instructions     Call MD for:  difficulty breathing, headache or visual disturbances   Complete by: As directed    Diet general   Complete by: As directed    Discharge instructions   Complete by: As directed    Can use over the counter cough medications , continue using breathing exercises at home . Isolation precautions for one week.   Increase activity slowly   Complete by: As directed    No wound care   Complete by: As directed       Allergies as of 04/20/2021   No Known Allergies      Medication List     STOP taking these medications    ibuprofen 800 MG tablet Commonly known as: ADVIL   oxyCODONE 5 MG immediate release tablet Commonly known as: Oxy IR/ROXICODONE       TAKE these medications    anastrozole 1 MG tablet Commonly known as: ARIMIDEX Take 1 tablet (1 mg total) by mouth daily.   cephALEXin 500 MG capsule Commonly known  as: KEFLEX Take 1 capsule (500 mg total) by mouth 3 (three) times daily for 2 days.   loratadine 10 MG tablet Commonly known as: CLARITIN Take 1 tablet (10 mg total) by mouth daily.   Mitigare 0.6 MG Caps Generic drug: Colchicine Take 0.6 mg by mouth daily at 6 (six) AM.    multivitamin with minerals Tabs tablet Take 1 tablet by mouth daily.   Myrbetriq 25 MG Tb24 tablet Generic drug: mirabegron ER Take 25 mg by mouth daily.   pantoprazole 40 MG tablet Commonly known as: PROTONIX Take 1 tablet (40 mg total) by mouth daily.   polyethylene glycol 17 g packet Commonly known as: MIRALAX / GLYCOLAX Take 17 g by mouth daily as needed. What changed: reasons to take this   senna-docusate 8.6-50 MG tablet Commonly known as: Senokot-S Take 1 tablet by mouth 2 (two) times daily.   VICKS DAYQUIL COLD & FLU PO Take 1 Dose by mouth 3 (three) times daily as needed (cough/fever/cold symptoms).               Durable Medical Equipment  (From admission, onward)           Start     Ordered   04/19/21 1402  For home use only DME Walker rolling  Once       Question Answer Comment  Walker: With 5 Inch Wheels   Patient needs a walker to treat with the following condition Weakness      04/19/21 1405            Follow-up Information     Lin Landsman, MD Follow up.   Specialty: Family Medicine Why: Please follow up in a week. Contact information: [place] [address] [phone]                No Known Allergies  Consultations: None   Procedures/Studies: DG Chest 1 View  Result Date: 04/05/2021 CLINICAL DATA:  Follow-up chest tube removal EXAM: PORTABLE CHEST 1 VIEW COMPARISON:  04/04/2021 FINDINGS: Right jugular central line remains in place. Cardiac shadow is stable. Right chest tube and mediastinal catheter have been removed in the interval. No recurrent pneumothorax is noted. Lungs are well aerated without focal infiltrate. Postsurgical changes in the left axilla are noted. No acute bony abnormality is seen. IMPRESSION: No recurrent pneumothorax following chest tube removal. Electronically Signed   By: Inez Catalina M.D.   On: 04/05/2021 17:48   DG Chest 2 View  Result Date: 04/06/2021 CLINICAL DATA:   80 year old female status post chest tube removal. EXAM: CHEST - 2 VIEW COMPARISON:  Chest x-ray 04/05/2021. FINDINGS: There is a right-sided internal jugular central venous catheter with tip terminating in the distal superior vena cava. Lung volumes are low. Skin fold artifact projecting over the lower right hemithorax. No acute consolidative airspace disease. Trace left pleural effusion. No definite right pleural effusion. No pneumothorax. No evidence of pulmonary edema. Heart size is normal. Upper mediastinal contours are within normal limits. Atherosclerotic calcifications in the thoracic aorta. Surgical clips project over the left breast and left axillary region, likely from prior left lumpectomy and axillary lymph node dissection. IMPRESSION: 1. Support apparatus, as above. 2. Trace left pleural effusion. 3. Aortic atherosclerosis. Electronically Signed   By: Vinnie Langton M.D.   On: 04/06/2021 08:16   DG Chest 2 View  Result Date: 03/29/2021 CLINICAL DATA:  Chest pain EXAM: CHEST - 2 VIEW COMPARISON:  03/27/2021 FINDINGS: Transverse diameter of heart is  increased. There are no signs of alveolar pulmonary edema. There is blunting of both lateral CP angles. There is no pneumothorax. Skin folds are overlying the lateral aspect of both lower lung fields. Surgical clips are seen in the left axilla. IMPRESSION: Cardiomegaly. There are no signs of pulmonary edema or focal pulmonary consolidation. There is blunting of both lateral CP angles suggesting possible small effusions. Electronically Signed   By: Elmer Picker M.D.   On: 03/29/2021 12:50   DG Chest 2 View  Result Date: 03/21/2021 CLINICAL DATA:  Chest pain beginning last night EXAM: CHEST - 2 VIEW COMPARISON:  08/10/2012 FINDINGS: Mild cardiomegaly. Tortuous aorta. The patient has taken a poor inspiration. Allowing for that, the lungs are clear. The vascularity is normal. No effusions. Surgical clips in the left axilla. IMPRESSION: Poor  inspiration. Allowing for that, no active disease is suspected. Electronically Signed   By: Nelson Chimes M.D.   On: 03/21/2021 09:59   CT HEAD WO CONTRAST (5MM)  Result Date: 04/17/2021 CLINICAL DATA:  Multiple syncopal episodes. EXAM: CT HEAD WITHOUT CONTRAST TECHNIQUE: Contiguous axial images were obtained from the base of the skull through the vertex without intravenous contrast. COMPARISON:  April 04, 2011 FINDINGS: Brain: There is mild cerebral atrophy with widening of the extra-axial spaces and ventricular dilatation. There are areas of decreased attenuation within the white matter tracts of the supratentorial brain, consistent with microvascular disease changes. Vascular: No hyperdense vessel or unexpected calcification. Skull: Normal. Negative for fracture or focal lesion. Sinuses/Orbits: There is moderate to marked severity bilateral ethmoid sinus mucosal thickening. Other: None. IMPRESSION: 1. Generalized cerebral atrophy. 2. No acute intracranial abnormality. 3. Moderate to marked severity bilateral ethmoid sinus disease. Electronically Signed   By: Virgina Norfolk M.D.   On: 04/17/2021 01:37   CT Angio Chest Pulmonary Embolism (PE) W or WO Contrast  Result Date: 04/17/2021 CLINICAL DATA:  Hypotension and elevated D-dimer levels. EXAM: CT ANGIOGRAPHY CHEST WITH CONTRAST TECHNIQUE: Multidetector CT imaging of the chest was performed using the standard protocol during bolus administration of intravenous contrast. Multiplanar CT image reconstructions and MIPs were obtained to evaluate the vascular anatomy. CONTRAST:  42mL OMNIPAQUE IOHEXOL 350 MG/ML SOLN COMPARISON:  Chest radiograph from 04/17/2021 and CT chest 03/21/2021 FINDINGS: Cardiovascular: No filling defect is identified in the pulmonary arterial tree to suggest pulmonary embolus. Atherosclerotic calcification of the aortic arch. Trace pericardial effusion, reduced from prior. Stably prominent main pulmonary artery favoring pulmonary  arterial hypertension. Mediastinum/Nodes: AP window lymph node 1.0 cm in short axis on image 47 series 5. Lower right paratracheal node 1.1 cm in short axis, image 52 series 5. Left paratracheal node 1.0 cm in short axis, image 46 series 5. Right hilar node 1.5 cm in short axis on image 60 series 5, previously similar. Right infrahilar node 1.0 cm in short axis, image 77 series 5. There is also left infrahilar adenopathy. Lungs/Pleura: Centrilobular emphysema. Increased bandlike and nodular atelectasis in the right middle lobe medially. Indistinct airspace opacity inferiorly in the right middle lobe for example on image 96 series 6. Increased right lower lobe atelectasis along the diaphragm. Mild left lower lobe atelectasis along the diaphragm. Upper Abdomen: Unremarkable Musculoskeletal: Unremarkable Review of the MIP images confirms the above findings. IMPRESSION: 1. No filling defect is identified in the pulmonary arterial tree to suggest pulmonary embolus. 2. Mild mediastinal, hilar, and infrahilar adenopathy is nonspecific and could be reactive or neoplastic. 3. Aortic Atherosclerosis (ICD10-I70.0) and Emphysema (ICD10-J43.9). 4. Increased atelectasis medially in  the right middle lobe with some indistinct airspace opacity inferiorly in the right middle lobe which could reflect early pneumonia. 5. Trace pericardial effusion although improved from prior. 6. Stable prominence of the main pulmonary artery favoring pulmonary arterial hypertension. Electronically Signed   By: Van Clines M.D.   On: 04/17/2021 16:02   CT CHEST ABDOMEN PELVIS W CONTRAST  Result Date: 03/21/2021 CLINICAL DATA:  Chest pain and shortness of breath. Suspect infection. EXAM: CT CHEST, ABDOMEN, AND PELVIS WITH CONTRAST TECHNIQUE: Multidetector CT imaging of the chest, abdomen and pelvis was performed following the standard protocol during bolus administration of intravenous contrast. CONTRAST:  60mL OMNIPAQUE IOHEXOL 350 MG/ML  SOLN COMPARISON:  None. FINDINGS: CT CHEST FINDINGS Cardiovascular: Heart is mildly enlarged. Aorta is ectatic without focal dilatation. There is a small pericardial effusion with some mild pericardial enhancement seen best on image 3/39. The main pulmonary artery is enlarged compatible with pulmonary artery hypertension. Mediastinum/Nodes: There is a 1 cm hypodense right thyroid nodule and a 5 mm hypodense left thyroid nodule. There are nonenlarged mediastinal lymph nodes diffusely. Esophagus is within normal limits. Lungs/Pleura: There are mild atelectatic changes in the lower lungs bilaterally. There is no focal lung infiltrate, pleural effusion or pneumothorax. There is a calcified granuloma in the left lung. Musculoskeletal: No chest wall mass or suspicious bone lesions identified. CT ABDOMEN PELVIS FINDINGS Hepatobiliary: No focal liver abnormality is seen. No gallstones, gallbladder wall thickening, or biliary dilatation. Pancreas: Unremarkable. No pancreatic ductal dilatation or surrounding inflammatory changes. Spleen: Normal in size without focal abnormality. Adrenals/Urinary Tract: There is a rounded hypodensity in the left kidney which is too small to characterize, most likely a cyst. The kidneys, adrenal glands, and bladder are otherwise within normal limits. Stomach/Bowel: Stomach is within normal limits. Appendix is not seen. No evidence of bowel wall thickening, distention, or inflammatory changes. There is sigmoid colon diverticulosis without evidence for acute diverticulitis. Vascular/Lymphatic: Aortic atherosclerosis. No enlarged abdominal or pelvic lymph nodes. Reproductive: Uterus and bilateral adnexa are unremarkable. Other: No abdominal wall hernia or abnormality. No abdominopelvic ascites. Musculoskeletal: Multilevel degenerative changes affect the spine IMPRESSION: 1. Small pericardial effusion with enhancement of the pericardium worrisome for pericarditis. 2. Mild cardiomegaly. Enlargement  of the main pulmonary artery compatible with pulmonary artery hypertension. 3. No acute localizing process in the abdomen or pelvis. 4. Sigmoid colon diverticulosis. 5. 1 cm incidental thyroid nodule. No follow-up imaging is recommended. Reference: J Am Coll Radiol. 2015 Feb;12(2): 143-50 6.  Aortic Atherosclerosis (ICD10-I70.0). Electronically Signed   By: Ronney Asters M.D.   On: 03/21/2021 21:54   DG Chest Port 1 View  Result Date: 04/17/2021 CLINICAL DATA:  Syncope. EXAM: PORTABLE CHEST 1 VIEW COMPARISON:  April 06, 2021 FINDINGS: The right internal jugular venous catheter seen on the prior study has been removed. A trace amount of linear atelectasis is noted within the right lung base. There is no evidence of acute infiltrate, pleural effusion or pneumothorax. The heart size and mediastinal contours are within normal limits. Radiopaque surgical clips are seen along the lateral aspect of the mid left chest wall. The visualized skeletal structures are unremarkable. IMPRESSION: No acute or active cardiopulmonary disease. Electronically Signed   By: Virgina Norfolk M.D.   On: 04/17/2021 01:23   DG CHEST PORT 1 VIEW  Result Date: 04/04/2021 CLINICAL DATA:  Chest tube present, pericardial effusion EXAM: PORTABLE CHEST 1 VIEW COMPARISON:  04/03/2021 FINDINGS: Unchanged AP portable examination. Cardiomegaly. Right neck vascular catheter. Right-sided chest and mediastinal  drainage tubes. No pneumothorax. Trace bilateral pleural effusions and mild, diffuse interstitial pulmonary opacity. No new or focal airspace opacity. IMPRESSION: 1.  Unchanged AP portable examination. Cardiomegaly. 2. Right-sided chest and mediastinal drainage tubes. No pneumothorax. 3. Trace bilateral pleural effusions and mild, diffuse interstitial pulmonary opacity, consistent with edema. No new or focal airspace opacity. Electronically Signed   By: Delanna Ahmadi M.D.   On: 04/04/2021 09:02   DG Chest Port 1 View  Result Date:  04/03/2021 CLINICAL DATA:  Chest tube.  Pericardial effusion. EXAM: PORTABLE CHEST 1 VIEW COMPARISON:  One-view chest x-ray 04/02/2021. FINDINGS: Marked is enlarged. Mediastinal drain and right-sided chest tube are stable. No pneumothorax is present. Small left pleural effusion and left basilar airspace disease remains. The lungs are otherwise clear. Surgical clips are present in the left axilla. IMPRESSION: 1. Stable cardiomegaly without failure. 2. Stable small left pleural effusion and left basilar airspace disease. 3. Stable right-sided chest tube without pneumothorax. Electronically Signed   By: San Morelle M.D.   On: 04/03/2021 09:28   DG Chest Port 1 View  Result Date: 04/02/2021 CLINICAL DATA:  Status post pericardial window with thoracotomy EXAM: PORTABLE CHEST 1 VIEW COMPARISON:  04/01/2021 FINDINGS: Unchanged mild cardiomegaly. Minimal left basilar atelectasis. Lungs otherwise clear. No pulmonary vascular congestion. Interval placement of right IJ central venous catheter which terminates at the cavoatrial junction. Mediastinal drain is seen. There is a right chest tube also in place. No pneumothorax. IMPRESSION: Unchanged mild cardiomegaly and mild left basilar atelectasis. Electronically Signed   By: Miachel Roux M.D.   On: 04/02/2021 12:47   DG Chest Port 1 View  Result Date: 04/01/2021 CLINICAL DATA:  Chest pain EXAM: PORTABLE CHEST 1 VIEW COMPARISON:  Chest radiograph dated March 29, 2021 FINDINGS: The heart is enlarged. No focal consolidation or pleural effusion. Surgical clips in the left chest wall. No acute osseous abnormality. IMPRESSION: 1.  Stable cardiomegaly. 2.  No focal consolidation or pleural effusion. Electronically Signed   By: Keane Police D.O.   On: 04/01/2021 10:06   DG CHEST PORT 1 VIEW  Result Date: 03/27/2021 CLINICAL DATA:  Shortness of breath EXAM: PORTABLE CHEST 1 VIEW COMPARISON:  Previous studies including the examination of 03/26/2021 FINDINGS:  Transverse diameter of heart is increased. There are no signs of alveolar pulmonary edema or new focal infiltrates. Small linear densities in the right parahilar region may suggest scarring or subsegmental atelectasis. There is minimal blunting left lateral CP angle. There is no pneumothorax. Surgical clips are seen in the left axilla. IMPRESSION: Cardiomegaly. There are no signs of pulmonary edema or new focal infiltrates. Electronically Signed   By: Elmer Picker M.D.   On: 03/27/2021 09:07   DG CHEST PORT 1 VIEW  Result Date: 03/26/2021 CLINICAL DATA:  Shortness of breath.  History of pericarditis. EXAM: PORTABLE CHEST 1 VIEW COMPARISON:  03/25/2021 FINDINGS: Enlarged cardiac silhouette as seen previously. Tortuous aorta. Lungs are clear. The vascularity is normal. No effusions. 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 IMPRESSION: No change. Enlarged cardiac silhouette. Tortuous aorta. Lungs clear. Electronically Signed   By: Nelson Chimes M.D.   On: 03/26/2021 08:40   DG CHEST PORT 1 VIEW  Result Date: 03/25/2021 CLINICAL DATA:  Shortness of breath EXAM: PORTABLE CHEST 1 VIEW COMPARISON:  Previous studies including the examination of 03/24/2021 FINDINGS: Transverse diameter of heart is increased. Thoracic aorta is ectatic. Increased markings are seen in the lower lung fields. There are no signs of alveolar pulmonary edema. There  is no new focal pulmonary consolidation. There is no significant pleural effusion or pneumothorax. IMPRESSION: Cardiomegaly. Linear densities in the lower lung fields may suggest scarring or subsegmental atelectasis. There are no signs of alveolar pulmonary edema or new focal pulmonary consolidation. Electronically Signed   By: Elmer Picker M.D.   On: 03/25/2021 08:30   DG CHEST PORT 1 VIEW  Result Date: 03/24/2021 CLINICAL DATA:  Shortness of breath. EXAM: PORTABLE CHEST 1 VIEW COMPARISON:  03/23/2021 FINDINGS: 0545 hours. The cardio pericardial silhouette is enlarged.  Streaky opacity at the lung bases suggest atelectasis. No overt edema or dense focal airspace consolidation. The visualized bony structures of the thorax show no acute abnormality. Gaseous bowel distention noted in the visualized upper abdomen. Telemetry leads overlie the chest. [place] IMPRESSION: Enlarged cardiopericardial silhouette with streaky bibasilar atelectasis. Electronically Signed   By: Misty Stanley M.D.   On: 03/24/2021 08:18   DG CHEST PORT 1 VIEW  Result Date: 03/23/2021 CLINICAL DATA:  Fever. EXAM: PORTABLE CHEST 1 VIEW COMPARISON:  03/21/2021 FINDINGS: 0519 hours. The cardio pericardial silhouette is enlarged. There is pulmonary vascular congestion without overt pulmonary edema. Interstitial markings are diffusely coarsened with chronic features. The visualized bony structures of the thorax show no acute abnormality. Telemetry leads overlie the chest. IMPRESSION: Chronic interstitial lung disease. No acute cardiopulmonary findings. Electronically Signed   By: Misty Stanley M.D.   On: 03/23/2021 06:58   ECHOCARDIOGRAM COMPLETE  Result Date: 04/17/2021    ECHOCARDIOGRAM REPORT   Patient Name:   TREAZURE NERY Date of Exam: 04/17/2021 Medical Rec #:  [id number]          Height:       61.0 in Accession #:    [id number]         Weight:       89.9 lb Date of Birth:  June 04, 1940           BSA:          1.345 m Patient Age:    82 years           BP:           114/73 mmHg Patient Gender: F                  HR:           88 bpm. Exam Location:  Inpatient Procedure: 2D Echo, 3D Echo, Cardiac Doppler, Color Doppler and Strain Analysis Indications:    Syncope R55  History:        Patient has prior history of Echocardiogram examinations, most                 recent 04/03/2021. Stroke; Risk Factors:Hypertension. COVID.  Sonographer:    Darlina Sicilian RDCS Referring Phys: [id number] [place]  1. Left ventricular ejection fraction, by estimation, is 65 to 70%. The left ventricle has normal  function. The left ventricle has no regional wall motion abnormalities. Left ventricular diastolic parameters are consistent with Grade I diastolic dysfunction (impaired relaxation). The average left ventricular global longitudinal strain is -16.5 %.  2. Right ventricular systolic function is normal. The right ventricular size is normal. There is moderately elevated pulmonary artery systolic pressure.  3. The mitral valve is normal in structure. No evidence of mitral valve regurgitation. No evidence of mitral stenosis.  4. The aortic valve is tricuspid. There is mild calcification of the aortic valve. There is mild thickening of the aortic valve. Aortic valve regurgitation  is not visualized. No aortic stenosis is present.  5. The inferior vena cava is dilated in size with <50% respiratory variability, suggesting right atrial pressure of 15 mmHg. FINDINGS  Left Ventricle: Left ventricular ejection fraction, by estimation, is 65 to 70%. The left ventricle has normal function. The left ventricle has no regional wall motion abnormalities. The average left ventricular global longitudinal strain is -16.5 %. The left ventricular internal cavity size was normal in size. There is no left ventricular hypertrophy. Left ventricular diastolic parameters are consistent with Grade I diastolic dysfunction (impaired relaxation). Normal left ventricular filling pressure. Right Ventricle: The right ventricular size is normal. No increase in right ventricular wall thickness. Right ventricular systolic function is normal. There is moderately elevated pulmonary artery systolic pressure. The tricuspid regurgitant velocity is 2.99 m/s, and with an assumed right atrial pressure of 15 mmHg, the estimated right ventricular systolic pressure is 86.7 mmHg. Left Atrium: Left atrial size was normal in size. Right Atrium: Right atrial size was normal in size. Pericardium: There is no evidence of pericardial effusion. Mitral Valve: The mitral valve  is normal in structure. No evidence of mitral valve regurgitation. No evidence of mitral valve stenosis. Tricuspid Valve: The tricuspid valve is normal in structure. Tricuspid valve regurgitation is mild . No evidence of tricuspid stenosis. Aortic Valve: The aortic valve is tricuspid. There is mild calcification of the aortic valve. There is mild thickening of the aortic valve. Aortic valve regurgitation is not visualized. No aortic stenosis is present. Pulmonic Valve: The pulmonic valve was normal in structure. Pulmonic valve regurgitation is not visualized. No evidence of pulmonic stenosis. Aorta: The aortic root is normal in size and structure. Venous: The inferior vena cava is dilated in size with less than 50% respiratory variability, suggesting right atrial pressure of 15 mmHg. IAS/Shunts: No atrial level shunt detected by color flow Doppler.  LEFT VENTRICLE PLAX 2D LVIDd:         3.60 cm   Diastology LVIDs:         2.30 cm   LV e' medial:    8.59 cm/s LV PW:         0.90 cm   LV E/e' medial:  8.6 LV IVS:        1.10 cm   LV e' lateral:   7.29 cm/s LVOT diam:     2.10 cm   LV E/e' lateral: 10.2 LV SV:         70 LV SV Index:   52        2D Longitudinal Strain LVOT Area:     3.46 cm  2D Strain GLS (A2C):   -16.6 %                          2D Strain GLS (A3C):   -15.3 %                          2D Strain GLS (A4C):   -17.6 %                          2D Strain GLS Avg:     -16.5 %                           3D Volume EF:  3D EF:        70 %                          LV EDV:       70 ml                          LV ESV:       21 ml                          LV SV:        49 ml RIGHT VENTRICLE RV S prime:     17.80 cm/s TAPSE (M-mode): 1.6 cm LEFT ATRIUM             Index        RIGHT ATRIUM           Index LA diam:        2.70 cm 2.01 cm/m   RA Area:     11.60 cm LA Vol (A2C):   37.9 ml 28.18 ml/m  RA Volume:   20.00 ml  14.87 ml/m LA Vol (A4C):   27.2 ml 20.22 ml/m LA Biplane Vol: 32.8  ml 24.39 ml/m  AORTIC VALVE LVOT Vmax:   110.00 cm/s LVOT Vmean:  66.400 cm/s LVOT VTI:    0.202 m  AORTA Ao Root diam: 2.90 cm Ao Asc diam:  2.90 cm MITRAL VALVE               TRICUSPID VALVE MV Area (PHT): 3.58 cm    TR Peak grad:   35.8 mmHg MV Decel Time: 212 msec    TR Vmax:        299.00 cm/s MV E velocity: 74.10 cm/s MV A velocity: 80.10 cm/s  SHUNTS MV E/A ratio:  0.93        Systemic VTI:  0.20 m                            Systemic Diam: 2.10 cm Skeet Latch MD Electronically signed by Skeet Latch MD Signature Date/Time: 04/17/2021/4:05:38 PM    Final    ECHOCARDIOGRAM COMPLETE  Result Date: 03/24/2021    ECHOCARDIOGRAM REPORT   Patient Name:   JACLENE BARTELT Date of Exam: 03/24/2021 Medical Rec #:  [id number]          Height:       61.0 in Accession #:    [id number]         Weight:       95.5 lb Date of Birth:  [birth year]           BSA:          1.379 m Patient Age:    53 years           BP:           119/75 mmHg Patient Gender: F                  HR:           91 bpm. Exam Location:  Inpatient Procedure: 2D Echo, Cardiac Doppler and Color Doppler Indications:    Pericardial effusion I31.3  History:        Patient has no prior history of Echocardiogram examinations.  Stroke; Risk Factors:Hypertension.  Sonographer:    Bernadene Person RDCS Referring Phys: [id number] [place]  1. Left ventricular ejection fraction, by estimation, is 65 to 70%. The left ventricle has normal function. The left ventricle has no regional wall motion abnormalities. Left ventricular diastolic parameters are indeterminate. Elevated left ventricular end-diastolic pressure.  2. Right ventricular systolic function is normal. The right ventricular size is normal. There is normal pulmonary artery systolic pressure.  3. The mitral valve is normal in structure. Trivial mitral valve regurgitation.  4. Tricuspid valve regurgitation is moderate.  5. The aortic valve is tricuspid. Aortic valve  regurgitation is not visualized. No aortic stenosis is present. FINDINGS  Left Ventricle: Left ventricular ejection fraction, by estimation, is 65 to 70%. The left ventricle has normal function. The left ventricle has no regional wall motion abnormalities. The left ventricular internal cavity size was normal in size. There is  no left ventricular hypertrophy. Left ventricular diastolic parameters are indeterminate. Elevated left ventricular end-diastolic pressure. Right Ventricle: The right ventricular size is normal. Right vetricular wall thickness was not well visualized. Right ventricular systolic function is normal. There is normal pulmonary artery systolic pressure. The tricuspid regurgitant velocity is 2.70 m/s, and with an assumed right atrial pressure of 3 mmHg, the estimated right ventricular systolic pressure is 34.7 mmHg. Left Atrium: Left atrial size was normal in size. Right Atrium: Right atrial size was normal in size. Pericardium: Trivial pericardial effusion is present. Mitral Valve: The mitral valve is normal in structure. Trivial mitral valve regurgitation. Tricuspid Valve: The tricuspid valve is grossly normal. Tricuspid valve regurgitation is moderate. Aortic Valve: The aortic valve is tricuspid. Aortic valve regurgitation is not visualized. No aortic stenosis is present. Pulmonic Valve: The pulmonic valve was grossly normal. Pulmonic valve regurgitation is not visualized. Aorta: The aortic root and ascending aorta are structurally normal, with no evidence of dilitation. IAS/Shunts: The interatrial septum was not well visualized.  LEFT VENTRICLE PLAX 2D LVIDd:         3.60 cm   Diastology LVIDs:         2.30 cm   LV e' medial:    4.58 cm/s LV PW:         1.00 cm   LV E/e' medial:  15.6 LV IVS:        0.90 cm   LV e' lateral:   5.18 cm/s LVOT diam:     2.10 cm   LV E/e' lateral: 13.8 LV SV:         59 LV SV Index:   43 LVOT Area:     3.46 cm  RIGHT VENTRICLE RV S prime:     7.58 cm/s TAPSE  (M-mode): 1.4 cm LEFT ATRIUM             Index        RIGHT ATRIUM           Index LA diam:        3.40 cm 2.47 cm/m   RA Area:     12.80 cm LA Vol (A2C):   45.9 ml 33.28 ml/m  RA Volume:   23.80 ml  17.26 ml/m LA Vol (A4C):   42.8 ml 31.03 ml/m LA Biplane Vol: 45.0 ml 32.63 ml/m  AORTIC VALVE LVOT Vmax:   103.00 cm/s LVOT Vmean:  67.300 cm/s LVOT VTI:    0.170 m  AORTA Ao Root diam: 3.00 cm Ao Asc diam:  3.40 cm MITRAL VALVE  TRICUSPID VALVE MV Area (PHT): 4.71 cm    TR Peak grad:   29.2 mmHg MV Decel Time: 161 msec    TR Vmax:        270.00 cm/s MV E velocity: 71.60 cm/s MV A velocity: 60.40 cm/s  SHUNTS MV E/A ratio:  1.19        Systemic VTI:  0.17 m                            Systemic Diam: 2.10 cm Mertie Moores MD Electronically signed by Mertie Moores MD Signature Date/Time: 03/24/2021/12:29:49 PM    Final    ECHOCARDIOGRAM LIMITED  Result Date: 04/03/2021    ECHOCARDIOGRAM LIMITED REPORT   Patient Name:   TAEYA THEALL Date of Exam: 04/03/2021 Medical Rec #:  [id number]          Height:       61.0 in Accession #:    [id number]         Weight:       95.5 lb Date of Birth:  1940-05-01           BSA:          1.379 m Patient Age:    78 years           BP:           91/58 mmHg Patient Gender: F                  HR:           92 bpm. Exam Location:  Inpatient Procedure: Cardiac Doppler, Color Doppler and Limited Echo Indications:    f/u pericardial effusion  History:        Patient has prior history of Echocardiogram examinations, most                 recent 04/01/2021. Pericarditis, Signs/Symptoms:Chest Pain; Risk                 Factors:Hypertension.  Sonographer:    Glo Herring Referring Phys: [id number] [place]  1. There is a small pericaridal effusion, greatest anterior to the RV. Respirophasic variation of the mitral inflow is < 30%. IVC is not dilated. There is not evidence of RV or RA collapse.  2. Left ventricular ejection fraction, by estimation, is  70 to 75%. The left ventricle has hyperdynamic function. The left ventricle has no regional wall motion abnormalities.  3. Right ventricular systolic function is normal. The right ventricular size is normal.  4. A small pericardial effusion is present. The pericardial effusion is circumferential.  5. The mitral valve is normal in structure.  6. The aortic valve is tricuspid.  7. The inferior vena cava is normal in size with greater than 50% respiratory variability, suggesting right atrial pressure of 3 mmHg. Comparison(s): A prior study was performed on 04/01/21. Prior images reviewed side by side. Pericardial effusion has decreased from 04/01/21. There is no evidence of increased pericardial pressure in this study. FINDINGS  Left Ventricle: Left ventricular ejection fraction, by estimation, is 70 to 75%. The left ventricle has hyperdynamic function. The left ventricle has no regional wall motion abnormalities. Right Ventricle: The right ventricular size is normal. Right ventricular systolic function is normal. Pericardium: A small pericardial effusion is present. The pericardial effusion is circumferential. Mitral Valve: The mitral valve is normal in structure. Tricuspid Valve: The tricuspid valve is normal in structure. Tricuspid  valve regurgitation is mild. Aortic Valve: The aortic valve is tricuspid. Venous: The inferior vena cava is normal in size with greater than 50% respiratory variability, suggesting right atrial pressure of 3 mmHg. IVC IVC diam: 1.60 cm Rudean Haskell MD Electronically signed by Rudean Haskell MD Signature Date/Time: 04/03/2021/5:13:06 PM    Final    ECHOCARDIOGRAM LIMITED  Result Date: 04/01/2021    ECHOCARDIOGRAM LIMITED REPORT   Patient Name:   AVYONNA WAGONER Date of Exam: 04/01/2021 Medical Rec #:  [id number]          Height:       61.0 in Accession #:    [id number]         Weight:       95.5 lb Date of Birth:  09-12-1940           BSA:          1.379 m Patient Age:     79 years           BP:           118/82 mmHg Patient Gender: F                  HR:           106 bpm. Exam Location:  Inpatient Procedure: Limited Echo and Limited Color Doppler Indications:    Chest Pain  History:        Patient has prior history of Echocardiogram examinations, most                 recent 03/24/2021. Risk Factors:Dyslipidemia. Breast CA.  Sonographer:    Wenda Low Referring Phys: [id number] TAYE T GONFA IMPRESSIONS  1. There is a large pericardial effusion locately predominately over the anterior aspect of the RV (up to 1.6 cm). It measures posterior to the LV (0.9 cm). This is new from the prior echo. There is subtle RV collapse in diastole. Poor PW tracing of MV inflow. The IVC is dilated ~2.1 cm with >50 collapse. Hepatic vein PW is suggestive of late diastolic reversal with inspiration. The findings could represent early tamponade physiology especially given the change in size of the effusion. Clinical correlation is recommended. Large pericardial effusion. The pericardial effusion is anterior to the right ventricle.  2. Left ventricular ejection fraction, by estimation, is 70 to 75%. The left ventricle has hyperdynamic function. The left ventricle has no regional wall motion abnormalities. There is mild concentric left ventricular hypertrophy.  3. Right ventricular systolic function is normal. The right ventricular size is normal.  4. The aortic valve is tricuspid. Comparison(s): Changes from prior study are noted. Effusion is larger compared with prior. FINDINGS  Left Ventricle: Left ventricular ejection fraction, by estimation, is 70 to 75%. The left ventricle has hyperdynamic function. The left ventricle has no regional wall motion abnormalities. The left ventricular internal cavity size was small. There is mild concentric left ventricular hypertrophy. Right Ventricle: The right ventricular size is normal. No increase in right ventricular wall thickness. Right ventricular systolic  function is normal. Left Atrium: Left atrial size was normal in size. Right Atrium: Right atrial size was normal in size. Pericardium: There is a large pericardial effusion locately predominately over the anterior aspect of the RV (up to 1.6 cm). It measures posterior to the LV (0.9 cm). This is new from the prior echo. There is subtle RV collapse in diastole. Poor PW tracing of MV inflow. The IVC is dilated ~2.1 cm with >50 collapse.  Hepatic vein PW is suggestive of late diastolic reversal with inspiration. The findings could represent early tamponade physiology especially given the change in size of the effusion. Clinical correlation is recommended. A large pericardial effusion is present. The pericardial effusion is anterior to the right ventricle. Aortic Valve: The aortic valve is tricuspid. Pulmonic Valve: The pulmonic valve was grossly normal. Aorta: The aortic root is normal in size and structure. IAS/Shunts: The atrial septum is grossly normal. LEFT VENTRICLE PLAX 2D LVIDd:         2.60 cm LVIDs:         1.40 cm LV PW:         1.20 cm LV IVS:        1.20 cm LVOT diam:     1.90 cm LVOT Area:     2.84 cm  LEFT ATRIUM             Index LA diam:        2.50 cm 1.81 cm/m LA Vol (A2C):   55.3 ml 40.09 ml/m LA Vol (A4C):   23.3 ml 16.89 ml/m LA Biplane Vol: 38.5 ml 27.91 ml/m   AORTA Ao Root diam: 2.70 cm  SHUNTS Systemic Diam: 1.90 cm Eleonore Chiquito MD Electronically signed by Eleonore Chiquito MD Signature Date/Time: 04/01/2021/4:06:42 PM    Final    (Echo, Carotid, EGD, Colonoscopy, ERCP)    Subjective: Patient seen and examined.  Some dry cough present.  She is eager to get out of the hospital.  Denies any complaints.  Denies any needs.   Discharge Exam: Vitals:   04/20/21 0314 04/20/21 0741  BP: 100/60 107/68  Pulse: 74 79  Resp: 18 18  Temp: (!) 95.6 F (35.3 C) 97.8 F (36.6 C)  SpO2: 97% 100%   Vitals:   04/20/21 0004 04/20/21 0129 04/20/21 0314 04/20/21 0741  BP:  99/61 100/60 107/68   Pulse:  74 74 79  Resp:  16 18 18   Temp: 99.4 F (37.4 C) 98.4 F (36.9 C) (!) 95.6 F (35.3 C) 97.8 F (36.6 C)  TempSrc: Oral Oral Oral Oral  SpO2: 95%  97% 100%  Weight:   41.8 kg   Height:        General: Pt is alert, awake, not in acute distress Appropriate for age.  Looks very comfortable.  On room air. Cardiovascular: RRR, S1/S2 +, no rubs, no gallops Respiratory: CTA bilaterally, no wheezing, no rhonchi Abdominal: Soft, NT, ND, bowel sounds + Extremities: no edema, no cyanosis    The results of significant diagnostics from this hospitalization (including imaging, microbiology, ancillary and laboratory) are listed below for reference.     Microbiology: Recent Results (from the past 240 hour(s))  Resp Panel by RT-PCR (Flu A&B, Covid) Nasopharyngeal Swab     Status: Abnormal   Collection Time: 04/17/21  6:20 AM   Specimen: Nasopharyngeal Swab; Nasopharyngeal(NP) swabs in vial transport medium  Result Value Ref Range Status   SARS Coronavirus 2 by RT PCR POSITIVE (A) NEGATIVE Final    Comment: (NOTE) SARS-CoV-2 target nucleic acids are DETECTED.  The SARS-CoV-2 RNA is generally detectable in upper respiratory specimens during the acute phase of infection. Positive results are indicative of the presence of the identified virus, but do not rule out bacterial infection or co-infection with other pathogens not detected by the test. Clinical correlation with patient history and other diagnostic information is necessary to determine patient infection status. The expected result is Negative.  Fact Sheet for Patients:  EntrepreneurPulse.com.au  Fact Sheet for Healthcare Providers: IncredibleEmployment.be  This test is not yet approved or cleared by the [place] FDA and  has been authorized for detection and/or diagnosis of SARS-CoV-2 by FDA under an Emergency Use Authorization (EUA).  This EUA will remain in effect (meaning this  test can be used) for the duration of  the COVID-19 declaration under Section 564(b)(1) of the A ct, 21 U.S.C. section 360bbb-3(b)(1), unless the authorization is terminated or revoked sooner.     Influenza A by PCR NEGATIVE NEGATIVE Final   Influenza B by PCR NEGATIVE NEGATIVE Final    Comment: (NOTE) The Xpert Xpress SARS-CoV-2/FLU/RSV plus assay is intended as an aid in the diagnosis of influenza from Nasopharyngeal swab specimens and should not be used as a sole basis for treatment. Nasal washings and aspirates are unacceptable for Xpert Xpress SARS-CoV-2/FLU/RSV testing.  Fact Sheet for Patients: EntrepreneurPulse.com.au  Fact Sheet for Healthcare Providers: IncredibleEmployment.be  This test is not yet approved or cleared by the [place] FDA and has been authorized for detection and/or diagnosis of SARS-CoV-2 by FDA under an Emergency Use Authorization (EUA). This EUA will remain in effect (meaning this test can be used) for the duration of the COVID-19 declaration under Section 564(b)(1) of the Act, 21 U.S.C. section 360bbb-3(b)(1), unless the authorization is terminated or revoked.  Performed at [facility], [place] [address], [place], [place] [address]   Urine Culture     Status: Abnormal   Collection Time: 04/17/21  8:13 AM   Specimen: Urine, Clean Catch  Result Value Ref Range Status   Specimen Description URINE, CLEAN CATCH  Final   Special Requests   Final    Normal Performed at [facility], [place] [address] [place], [place] [address]    Culture MULTIPLE SPECIES PRESENT, SUGGEST RECOLLECTION (A)  Final   Report Status 04/18/2021 FINAL  Final  Resp Panel by RT-PCR (Flu A&B, Covid) Nasopharyngeal Swab     Status: Abnormal   Collection Time: 04/17/21 10:06 AM   Specimen: Nasopharyngeal Swab; Nasopharyngeal(NP) swabs in vial transport medium  Result Value Ref Range Status   SARS Coronavirus 2 by RT PCR POSITIVE  (A) NEGATIVE Final    Comment: (NOTE) SARS-CoV-2 target nucleic acids are DETECTED.  The SARS-CoV-2 RNA is generally detectable in upper respiratory specimens during the acute phase of infection. Positive results are indicative of the presence of the identified virus, but do not rule out bacterial infection or co-infection with other pathogens not detected by the test. Clinical correlation with patient history and other diagnostic information is necessary to determine patient infection status. The expected result is Negative.  Fact Sheet for Patients: EntrepreneurPulse.com.au  Fact Sheet for Healthcare Providers: IncredibleEmployment.be  This test is not yet approved or cleared by the [place] FDA and  has been authorized for detection and/or diagnosis of SARS-CoV-2 by FDA under an Emergency Use Authorization (EUA).  This EUA will remain in effect (meaning this test can be used) for the duration of  the COVID-19 declaration under Section 564(b)(1) of the A ct, 21 U.S.C. section 360bbb-3(b)(1), unless the authorization is terminated or revoked sooner.     Influenza A by PCR NEGATIVE NEGATIVE Final   Influenza B by PCR NEGATIVE NEGATIVE Final    Comment: (NOTE) The Xpert Xpress SARS-CoV-2/FLU/RSV plus assay is intended as an aid in the diagnosis of influenza from Nasopharyngeal swab specimens and should not be used as a sole basis for treatment. Nasal  washings and aspirates are unacceptable for Xpert Xpress SARS-CoV-2/FLU/RSV testing.  Fact Sheet for Patients: EntrepreneurPulse.com.au  Fact Sheet for Healthcare Providers: IncredibleEmployment.be  This test is not yet approved or cleared by the [place] FDA and has been authorized for detection and/or diagnosis of SARS-CoV-2 by FDA under an Emergency Use Authorization (EUA). This EUA will remain in effect (meaning this test can be used) for the  duration of the COVID-19 declaration under Section 564(b)(1) of the Act, 21 U.S.C. section 360bbb-3(b)(1), unless the authorization is terminated or revoked.  Performed at [facility], [place] [address] [place], [place] [address]      Labs: BNP (last 3 results) Recent Labs    04/01/21 0909 04/17/21 0103  BNP 101.0* 88.4   Basic Metabolic Panel: Recent Labs  Lab 04/17/21 0103 04/17/21 1006 04/18/21 0427 04/19/21 0456  NA 133*  --  132* 134*  K 3.6  --  2.7* 3.6  CL 98  --  103 105  CO2 22  --  22 23  GLUCOSE 104*  --  87 86  BUN 23  --  13 8  CREATININE 1.07*  --  0.77 0.60  CALCIUM 8.7*  --  7.6* 7.9*  MG  --   --  1.6* 1.9  PHOS  --  3.0 2.5 2.4*   Liver Function Tests: Recent Labs  Lab 04/17/21 0103  AST 41  ALT 23  ALKPHOS 71  BILITOT 0.7  PROT 6.7  ALBUMIN 2.5*   No results for input(s): LIPASE, AMYLASE in the last 168 hours. No results for input(s): AMMONIA in the last 168 hours. CBC: Recent Labs  Lab 04/17/21 0103 04/18/21 0427  WBC 7.7 4.9  NEUTROABS 6.1  --   HGB 10.6* 8.4*  HCT 31.5* 24.9*  MCV 75.9* 77.1*  PLT PLATELET CLUMPS NOTED ON SMEAR, UNABLE TO ESTIMATE 374   Cardiac Enzymes: No results for input(s): CKTOTAL, CKMB, CKMBINDEX, TROPONINI in the last 168 hours. BNP: Invalid input(s): POCBNP CBG: No results for input(s): GLUCAP in the last 168 hours. D-Dimer Recent Labs    04/17/21 1006  DDIMER 5.36*   Hgb A1c No results for input(s): HGBA1C in the last 72 hours. Lipid Profile No results for input(s): CHOL, HDL, LDLCALC, TRIG, CHOLHDL, LDLDIRECT in the last 72 hours. Thyroid function studies No results for input(s): TSH, T4TOTAL, T3FREE, THYROIDAB in the last 72 hours.  Invalid input(s): FREET3 Anemia work up No results for input(s): VITAMINB12, FOLATE, FERRITIN, TIBC, IRON, RETICCTPCT in the last 72 hours. Urinalysis    Component Value Date/Time   COLORURINE YELLOW 04/17/2021 0500   APPEARANCEUR HAZY (A)  04/17/2021 0500   LABSPEC 1.012 04/17/2021 0500   PHURINE 5.0 04/17/2021 0500   GLUCOSEU NEGATIVE 04/17/2021 0500   HGBUR SMALL (A) 04/17/2021 0500   BILIRUBINUR NEGATIVE 04/17/2021 0500   KETONESUR 5 (A) 04/17/2021 0500   PROTEINUR 30 (A) 04/17/2021 0500   NITRITE NEGATIVE 04/17/2021 0500   LEUKOCYTESUR TRACE (A) 04/17/2021 0500   Sepsis Labs Invalid input(s): PROCALCITONIN,  WBC,  LACTICIDVEN Microbiology Recent Results (from the past 240 hour(s))  Resp Panel by RT-PCR (Flu A&B, Covid) Nasopharyngeal Swab     Status: Abnormal   Collection Time: 04/17/21  6:20 AM   Specimen: Nasopharyngeal Swab; Nasopharyngeal(NP) swabs in vial transport medium  Result Value Ref Range Status   SARS Coronavirus 2 by RT PCR POSITIVE (A) NEGATIVE Final    Comment: (NOTE) SARS-CoV-2 target nucleic acids are DETECTED.  The SARS-CoV-2 RNA  is generally detectable in upper respiratory specimens during the acute phase of infection. Positive results are indicative of the presence of the identified virus, but do not rule out bacterial infection or co-infection with other pathogens not detected by the test. Clinical correlation with patient history and other diagnostic information is necessary to determine patient infection status. The expected result is Negative.  Fact Sheet for Patients: EntrepreneurPulse.com.au  Fact Sheet for Healthcare Providers: IncredibleEmployment.be  This test is not yet approved or cleared by the [place] FDA and  has been authorized for detection and/or diagnosis of SARS-CoV-2 by FDA under an Emergency Use Authorization (EUA).  This EUA will remain in effect (meaning this test can be used) for the duration of  the COVID-19 declaration under Section 564(b)(1) of the A ct, 21 U.S.C. section 360bbb-3(b)(1), unless the authorization is terminated or revoked sooner.     Influenza A by PCR NEGATIVE NEGATIVE Final   Influenza B by PCR  NEGATIVE NEGATIVE Final    Comment: (NOTE) The Xpert Xpress SARS-CoV-2/FLU/RSV plus assay is intended as an aid in the diagnosis of influenza from Nasopharyngeal swab specimens and should not be used as a sole basis for treatment. Nasal washings and aspirates are unacceptable for Xpert Xpress SARS-CoV-2/FLU/RSV testing.  Fact Sheet for Patients: EntrepreneurPulse.com.au  Fact Sheet for Healthcare Providers: IncredibleEmployment.be  This test is not yet approved or cleared by the [place] FDA and has been authorized for detection and/or diagnosis of SARS-CoV-2 by FDA under an Emergency Use Authorization (EUA). This EUA will remain in effect (meaning this test can be used) for the duration of the COVID-19 declaration under Section 564(b)(1) of the Act, 21 U.S.C. section 360bbb-3(b)(1), unless the authorization is terminated or revoked.  Performed at [facility], [place] [address] [place], [place] [address]   Urine Culture     Status: Abnormal   Collection Time: 04/17/21  8:13 AM   Specimen: Urine, Clean Catch  Result Value Ref Range Status   Specimen Description URINE, CLEAN CATCH  Final   Special Requests   Final    Normal Performed at [facility], [place] [address], [place], [place] [address]    Culture MULTIPLE SPECIES PRESENT, SUGGEST RECOLLECTION (A)  Final   Report Status 04/18/2021 FINAL  Final  Resp Panel by RT-PCR (Flu A&B, Covid) Nasopharyngeal Swab     Status: Abnormal   Collection Time: 04/17/21 10:06 AM   Specimen: Nasopharyngeal Swab; Nasopharyngeal(NP) swabs in vial transport medium  Result Value Ref Range Status   SARS Coronavirus 2 by RT PCR POSITIVE (A) NEGATIVE Final    Comment: (NOTE) SARS-CoV-2 target nucleic acids are DETECTED.  The SARS-CoV-2 RNA is generally detectable in upper respiratory specimens during the acute phase of infection. Positive results are indicative of the presence of the identified  virus, but do not rule out bacterial infection or co-infection with other pathogens not detected by the test. Clinical correlation with patient history and other diagnostic information is necessary to determine patient infection status. The expected result is Negative.  Fact Sheet for Patients: EntrepreneurPulse.com.au  Fact Sheet for Healthcare Providers: IncredibleEmployment.be  This test is not yet approved or cleared by the [place] FDA and  has been authorized for detection and/or diagnosis of SARS-CoV-2 by FDA under an Emergency Use Authorization (EUA).  This EUA will remain in effect (meaning this test can be used) for the duration of  the COVID-19 declaration under Section 564(b)(1) of the A ct, 21 U.S.C.  section 360bbb-3(b)(1), unless the authorization is terminated or revoked sooner.     Influenza A by PCR NEGATIVE NEGATIVE Final   Influenza B by PCR NEGATIVE NEGATIVE Final    Comment: (NOTE) The Xpert Xpress SARS-CoV-2/FLU/RSV plus assay is intended as an aid in the diagnosis of influenza from Nasopharyngeal swab specimens and should not be used as a sole basis for treatment. Nasal washings and aspirates are unacceptable for Xpert Xpress SARS-CoV-2/FLU/RSV testing.  Fact Sheet for Patients: EntrepreneurPulse.com.au  Fact Sheet for Healthcare Providers: IncredibleEmployment.be  This test is not yet approved or cleared by the [place] FDA and has been authorized for detection and/or diagnosis of SARS-CoV-2 by FDA under an Emergency Use Authorization (EUA). This EUA will remain in effect (meaning this test can be used) for the duration of the COVID-19 declaration under Section 564(b)(1) of the Act, 21 U.S.C. section 360bbb-3(b)(1), unless the authorization is terminated or revoked.  Performed at [facility], [place] [address], [place], [place] [address]      Time coordinating  discharge:  35 minutes  SIGNED:   Barb Merino, MD  [facility] 04/20/2021, 9:47 AM

## 2021-04-20 NOTE — TOC Transition Note (Signed)
Transition of Care [facility]) - CM/SW Discharge Note   Patient Details  Name: Karey Suthers MRN: [id number] Date of Birth: 1940-06-06  Transition of Care [facility]) CM/SW Contact:  Carles Collet, RN Phone Number: 04/20/2021, 10:28 AM   Clinical Narrative:   Damaris Schooner w granddaughter over the phone. Verified plan to dc  to home w [facility] services. Verified w Alvis Lemmings that patient is active. Cyatte states that patient will be staying with her in [facility], could not provide address, kept referencing that it is the same address as before, was placed on hold and she provide the street name [address]. Asked her to provide house/ apartment number and she said again it was the same as before. Explained that we need to verify with each discharge the address the patient will DC to and she hung up the phone, would not answer call back, no there family members answering phone either.   HH is arranged, resuming care w Alvis Lemmings.  TOC signing off    [facility] Daughter [phone]    Reffitt,Cyatta Granddaughter   [phone]  Dariella, Gillihan Daughter   [phone]      Final next level of care: Home w Home Health Services Barriers to Discharge: No Barriers Identified   Patient Goals and CMS Choice Patient states their goals for this hospitalization and ongoing recovery are:: return home      Discharge Placement                       Discharge Plan and Services                                     Social Determinants of Health (SDOH) Interventions     Readmission Risk Interventions No flowsheet data found.

## 2021-04-20 NOTE — Progress Notes (Signed)
Tried to call granddaughter to ensure she knew patient has discharge orders and a medication that needs to be picked up at [facility]. Could not leave message.  Called and spoke to patient's daughter Santiago Glad) and she stated that she would try to contact the granddaughter or other daughter about discharge and medication needs.

## 2021-05-02 ENCOUNTER — Inpatient Hospital Stay: Payer: Medicare HMO | Admitting: Pulmonary Disease

## 2021-05-07 ENCOUNTER — Ambulatory Visit (INDEPENDENT_AMBULATORY_CARE_PROVIDER_SITE_OTHER): Payer: Self-pay

## 2021-05-07 ENCOUNTER — Other Ambulatory Visit: Payer: Self-pay

## 2021-05-07 DIAGNOSIS — Z4802 Encounter for removal of sutures: Secondary | ICD-10-CM

## 2021-05-07 NOTE — Progress Notes (Signed)
Patient arrived for nurse visit to remove one suture post- procedure pericardial window with Dr. Kipp Brood 04/02/21.  Suture removed with no signs/ symptoms of infection noted.  Patient tolerated procedure well.  Patient/ family instructed to keep the incision sites clean and dry.  Patient/ family acknowledged instructions given.

## 2021-05-16 ENCOUNTER — Inpatient Hospital Stay: Payer: Medicare HMO | Admitting: Pulmonary Disease

## 2021-05-16 LAB — ACID FAST CULTURE WITH REFLEXED SENSITIVITIES (MYCOBACTERIA): Acid Fast Culture: NEGATIVE

## 2021-05-22 ENCOUNTER — Other Ambulatory Visit: Payer: Self-pay

## 2021-05-22 ENCOUNTER — Encounter: Payer: Self-pay | Admitting: Pulmonary Disease

## 2021-05-22 ENCOUNTER — Ambulatory Visit (INDEPENDENT_AMBULATORY_CARE_PROVIDER_SITE_OTHER): Payer: Medicare HMO | Admitting: Pulmonary Disease

## 2021-05-22 VITALS — BP 110/60 | HR 69 | Temp 97.6°F | Ht 60.0 in | Wt 92.6 lb

## 2021-05-22 DIAGNOSIS — R0602 Shortness of breath: Secondary | ICD-10-CM | POA: Diagnosis not present

## 2021-05-22 NOTE — Patient Instructions (Signed)
I am glad you are doing well after your recent hospitalization We will get a high-res CT and PFTs for better evaluation of the lung Follow-up in clinic in 1 to 2 months after these tests

## 2021-05-22 NOTE — Progress Notes (Signed)
[place]    [id number]    09/10/1940  Primary Care Physician:Reese, Zadie Cleverly, MD  Referring Physician: Lin Landsman, [place],  [place] [address]  Chief complaint: Consult for abnormal CT, emphysema  HPI: 81 year old with history of asthma, hypertension, stroke She had recent admission in 12/1 to 04/06/22 with flu infection, noted to have pericarditis with trivial pericardial effusion.  Treated with prednisone, colchicine for postviral pericarditis.  However repeat echocardiogram in 04/01/2021 showed Arash effusion with tamponade and underwent VATS and pericardial window on 04/02/2021.  She was readmitted later in December with dizziness and found to be COVID-positive.  Treated with IV fluids and supportive care.  She received 3 days of antibiotics for possible infiltrate on CT scan  She has been referred to [facility] for evaluation of possible ILD on CT scan and follow-up for calcified granuloma States that breathing is doing well with no issues.  She has made good recovery  Pets: No pets Occupation: Used to work as a Engineer, building services Exposures: No significant ongoing exposure history.  She was exposed to mold 4 years ago at her old house Smoking history: 30-pack-year smoker.  Quit in 1980s Travel history: No significant travel history Relevant family history: No family history of lung disease  Outpatient Encounter Medications as of 05/22/2021  Medication Sig   anastrozole (ARIMIDEX) 1 MG tablet Take 1 tablet (1 mg total) by mouth daily.   Colchicine (MITIGARE) 0.6 MG CAPS Take 0.6 mg by mouth daily at 6 (six) AM.   DM-Phenylephrine-Acetaminophen (VICKS DAYQUIL COLD & FLU PO) Take 1 Dose by mouth 3 (three) times daily as needed (cough/fever/cold symptoms).   loratadine (CLARITIN) 10 MG tablet Take 1 tablet (10 mg total) by mouth daily.   Multiple Vitamin (MULTIVITAMIN WITH MINERALS) TABS tablet Take 1 tablet by mouth daily.   MYRBETRIQ 25 MG TB24 tablet  Take 25 mg by mouth daily.   pantoprazole (PROTONIX) 40 MG tablet Take 1 tablet (40 mg total) by mouth daily.   polyethylene glycol (MIRALAX / GLYCOLAX) 17 g packet Take 17 g by mouth daily as needed. (Patient taking differently: Take 17 g by mouth daily as needed for moderate constipation.)   senna-docusate (SENOKOT-S) 8.6-50 MG tablet Take 1 tablet by mouth 2 (two) times daily.   No facility-administered encounter medications on file as of 05/22/2021.    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   Hypertension    Stroke ([place])    per pt no defecits    Past Surgical History:  Procedure Laterality Date   BREAST LUMPECTOMY WITH AXILLARY LYMPH NODE BIOPSY Left 04/06/2019   Procedure: LEFT BREAST LUMPECTOMY WITH LEFT SENTINEL LYMPH NODE BIOPSY;  Surgeon: [name], MD;  Location: [facility];  Service: General;  Laterality: Left;   BREAST SURGERY     RE-EXCISION OF BREAST LUMPECTOMY Left 05/04/2019   Procedure: RE-EXCISION OF LEFT BREAST LUMPECTOMY;  Surgeon: [name], MD;  Location: [facility];  Service: General;  Laterality: Left;    Family History  Problem Relation Age of Onset   Breast cancer Mother    Lung cancer Father     Social History   Socioeconomic History   Marital status: Widowed    Spouse name: Not on file  Number of children: Not on file   Years of education: Not on file   Highest education level: Not on file  Occupational History   Not on file  Tobacco Use   Smoking status: Former   Smokeless tobacco: Never  Vaping Use   Vaping Use: Never used  Substance and Sexual Activity   Alcohol use: No   Drug use: No   Sexual activity: Never  Other Topics Concern   Not on file  Social History Narrative   Not on file   Social Determinants of Health   Financial Resource Strain: Not on file  Food Insecurity: Not on file   Transportation Needs: Not on file  Physical Activity: Not on file  Stress: Not on file  Social Connections: Not on file  Intimate Partner Violence: Not on file    Review of systems: Review of Systems  Constitutional: Negative for fever and chills.  HENT: Negative.   Eyes: Negative for blurred vision.  Respiratory: as per HPI  Cardiovascular: Negative for chest pain and palpitations.  Gastrointestinal: Negative for vomiting, diarrhea, blood per rectum. Genitourinary: Negative for dysuria, urgency, frequency and hematuria.  Musculoskeletal: Negative for myalgias, back pain and joint pain.  Skin: Negative for itching and rash.  Neurological: Negative for dizziness, tremors, focal weakness, seizures and loss of consciousness.  Endo/Heme/Allergies: Negative for environmental allergies.  Psychiatric/Behavioral: Negative for depression, suicidal ideas and hallucinations.  All other systems reviewed and are negative.  Physical Exam: Blood pressure 110/60, pulse 69, temperature 97.6 F (36.4 C), temperature source Oral, height 5' (1.524 m), weight 92 lb 9.6 oz (42 kg), SpO2 97 %. Gen:      No acute distress, frail, malnourished HEENT:  EOMI, sclera anicteric Neck:     No masses; no thyromegaly Lungs:    Clear to auscultation bilaterally; normal respiratory effort CV:         Regular rate and rhythm; no murmurs Abd:      + bowel sounds; soft, non-tender; no palpable masses, no distension Ext:    No edema; adequate peripheral perfusion Skin:      Warm and dry; no rash Neuro: alert and oriented x 3 Psych: normal mood and affect  Data Reviewed: Imaging: CT chest 03/21/2021-small pericardial effusion with cardiomegaly, calcified granuloma in the left lung  CTA 04/17/2021-no pulmonary embolism, mild mediastinal and infrahilar adenopathy, atelectasis in the right middle lobe with airspace disease  I have reviewed the images personally  PFTs:  Labs:  Assessment:  Consult for  abnormal CT I have reviewed her 2 CT scans of the chest from December.  There is no evidence of interstitial lung disease.  She did have pneumonia in later December in the setting of flu and COVID infection.    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  S/p VATS and drainage Continue monitoring  Plan/Recommendations: High-resolution CT, PFTs  Marshell Garfinkel MD [facility] 05/22/2021, 3:04 PM  CC: Lin Landsman, MD

## 2021-05-30 NOTE — Progress Notes (Signed)
Cardiology Office Note:   Date:  05/31/2021  NAME:  Nicole Rush    MRN: [id number] DOB:  [birth year]   PCP:  Lin Landsman, MD  Cardiologist:  None  Electrophysiologist:  None   Referring MD: Lin Landsman, MD   Chief Complaint  Patient presents with   Follow-up        History of Present Illness:   Nicole Rush is a 81 y.o. female with a hx of acute pericarditis in December 4503 complicated by pericardial effusion with cardiac tamponade status post pericardial window, hypertension, stroke who presents for follow-up.  Admitted in December with influenza.  Developed pericardial tamponade.  Status post pericardial window.  Completed a course of NSAIDs.  Remains on colchicine. She reports she is doing well.  She was rehospitalized in late December with COVID.  CRP was still elevated but I suspect this was COVID-related.  She denies any further chest pain symptoms.  She has recovered from her pericardial window.  Blood pressure is well controlled.  She reports she is exercising doing weight training as well as walking.  She has no issues.  She is still on colchicine.  We did discuss rechecking her blood work today.  We need to make sure her inflammatory markers returned to negligible values.  She is in agreement with this plan.  CRP 04/17/2021 4.3  03/28/2021 CRP 10.6 ESR 70  Problem List Acute pericarditis 03/2021 -2/2 influenza c/b pericardial tamponade  -s/p pericardial window 04/02/2021 -AFB -, fungal -, baterial - 2. HTN 3. CVA 4. 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   Hypertension    Stroke ([place])    per pt no defecits    Past Surgical History: Past Surgical History:  Procedure Laterality Date   BREAST LUMPECTOMY WITH AXILLARY LYMPH NODE BIOPSY Left 04/06/2019   Procedure: LEFT BREAST LUMPECTOMY WITH LEFT SENTINEL LYMPH NODE BIOPSY;  Surgeon: [name], MD;  Location:  [facility];  Service: General;  Laterality: Left;   BREAST SURGERY     RE-EXCISION OF BREAST LUMPECTOMY Left 05/04/2019   Procedure: RE-EXCISION OF LEFT BREAST LUMPECTOMY;  Surgeon: [name], MD;  Location: [facility];  Service: General;  Laterality: Left;    Current Medications: Current Meds  Medication Sig   anastrozole (ARIMIDEX) 1 MG tablet Take 1 tablet (1 mg total) by mouth daily.   DM-Phenylephrine-Acetaminophen (VICKS DAYQUIL COLD & FLU PO) Take 1 Dose by mouth 3 (three) times daily as needed (cough/fever/cold symptoms).   loratadine (CLARITIN) 10 MG tablet Take 1 tablet (10 mg total) by mouth daily.   Multiple Vitamin (MULTIVITAMIN WITH MINERALS) TABS tablet Take 1 tablet by mouth daily.   MYRBETRIQ 25 MG TB24 tablet Take 25 mg by mouth daily.   pantoprazole (PROTONIX) 40 MG tablet Take 1 tablet (40 mg total) by mouth daily.   polyethylene glycol (MIRALAX / GLYCOLAX) 17 g packet Take 17 g by mouth daily as needed. (Patient taking differently: Take 17 g by mouth daily as needed for moderate constipation.)   senna-docusate (SENOKOT-S) 8.6-50 MG tablet Take 1 tablet by mouth 2 (two) times daily.   [DISCONTINUED] Colchicine (MITIGARE) 0.6 MG CAPS Take 0.6 mg by mouth daily at 6 (six) AM.     Allergies:    Patient has no known allergies.   Social History: Social History   Socioeconomic History   Marital  status: Widowed    Spouse name: Not on file   Number of children: Not on file   Years of education: Not on file   Highest education level: Not on file  Occupational History   Not on file  Tobacco Use   Smoking status: Former   Smokeless tobacco: Never  Vaping Use   Vaping Use: Never used  Substance and Sexual Activity   Alcohol use: No   Drug use: No   Sexual activity: Never  Other Topics Concern   Not on file  Social History Narrative   Not on file   Social Determinants of Health   Financial Resource Strain: Not on file  Food  Insecurity: Not on file  Transportation Needs: Not on file  Physical Activity: Not on file  Stress: Not on file  Social Connections: Not on file     Family History: The patient's family history includes Breast cancer in her mother; Lung cancer in her father.  ROS:   All other ROS reviewed and negative. Pertinent positives noted in the HPI.     EKGs/Labs/Other Studies Reviewed:   The following studies were personally reviewed by me today:  TTE 04/17/2021  1. Left ventricular ejection fraction, by estimation, is 65 to 70%. The  left ventricle has normal function. The left ventricle has no regional  wall motion abnormalities. Left ventricular diastolic parameters are  consistent with Grade I diastolic  dysfunction (impaired relaxation). The average left ventricular global  longitudinal strain is -16.5 %.   2. Right ventricular systolic function is normal. The right ventricular  size is normal. There is moderately elevated pulmonary artery systolic  pressure.   3. The mitral valve is normal in structure. No evidence of mitral valve  regurgitation. No evidence of mitral stenosis.   4. The aortic valve is tricuspid. There is mild calcification of the  aortic valve. There is mild thickening of the aortic valve. Aortic valve  regurgitation is not visualized. No aortic stenosis is present.   5. The inferior vena cava is dilated in size with <50% respiratory  variability, suggesting right atrial pressure of 15 mmHg.   Recent Labs: 04/04/2021: TSH 4.732 04/17/2021: ALT 23; B Natriuretic Peptide 88.5 04/18/2021: Hemoglobin 8.4; Platelets 374 04/19/2021: BUN 8; Creatinine, Ser 0.60; Magnesium 1.9; Potassium 3.6; Sodium 134   Recent Lipid Panel    Component Value Date/Time   CHOL 201 (H) 08/11/2012 0307   TRIG 129 08/11/2012 0307   HDL 43 08/11/2012 0307   CHOLHDL 4.7 08/11/2012 0307   VLDL 26 08/11/2012 0307   LDLCALC 132 (H) 08/11/2012 0307    Physical Exam:   VS:  BP 120/72     Pulse 84    Ht 5' (1.524 m)    Wt 92 lb 12.8 oz (42.1 kg)    SpO2 100%    BMI 18.12 kg/m    Wt Readings from Last 3 Encounters:  05/31/21 92 lb 12.8 oz (42.1 kg)  05/22/21 92 lb 9.6 oz (42 kg)  04/20/21 92 lb 2.4 oz (41.8 kg)    General: Well nourished, well developed, in no acute distress ead: Atraumatic, normal size  Eyes: PEERLA, EOMI  Neck: Supple, no JVD Endocrine: No thryomegaly Cardiac: Normal S1, S2; RRR; no murmurs, rubs, or gallops Lungs: Clear to auscultation bilaterally, no wheezing, rhonchi or rales  Abd: Soft, nontender, no hepatomegaly  Ext: No edema, pulses 2+ Musculoskeletal: No deformities, BUE and BLE strength normal and equal Skin: Warm and dry, no rashes  Neuro: Alert and oriented to person, place, time, and situation, CNII-XII grossly intact, no focal deficits  Psych: Normal mood and affect   ASSESSMENT:   Nicole Rush is a 81 y.o. female who presents for the following: 1. Viral pericarditis, unspecified chronicity   2. S/P pericardial window creation     PLAN:   1. Viral pericarditis, unspecified chronicity 2. S/P pericardial window creation -Admitted to the hospital in early December for influenza.  Her course was complicated by acute pericarditis with pericardial tamponade.  She is status post pericardial window.  Her inflammatory markers were elevated.  She completed a course of ibuprofen and has remained on colchicine.  She was actually readmitted for COVID in late December.  CRP was still elevated.  She reports no chest pain symptoms.  Her cardiovascular examination is normal.  We will continue colchicine for another 3 months.  I will recheck her ESR and CRP today.  Suspect these will be normal.  She will see me back in 6 months.   Disposition: Return in about 6 months (around 11/28/2021).  Medication Adjustments/Labs and Tests Ordered: Current medicines are reviewed at length with the patient today.  Concerns regarding medicines are outlined  above.  Orders Placed This Encounter  Procedures   Sedimentation rate   C-reactive protein   Meds ordered this encounter  Medications   Colchicine (MITIGARE) 0.6 MG CAPS    Sig: Take 0.6 mg by mouth daily at 6 (six) AM.    Dispense:  90 capsule    Refill:  0    Patient Instructions  Medication Instructions:  Continue colchicine for the next 3 months, then stop.  *If you need a refill on your cardiac medications before your next appointment, please call your pharmacy*   Lab Work: CRP, ESR today   If you have labs (blood work) drawn today and your tests are completely normal, you will receive your results only by: [place] (if you have MyChart) OR A paper copy in the mail If you have any lab test that is abnormal or we need to change your treatment, we will call you to review the results.   Follow-Up: At [facility], you and your health needs are our priority.  As part of our continuing mission to provide you with exceptional heart care, we have created designated Provider Care Teams.  These Care Teams include your primary Cardiologist (physician) and Advanced Practice Providers (APPs -  Physician Assistants and Nurse Practitioners) who all work together to provide you with the care you need, when you need it.  We recommend signing up for the patient portal called "MyChart".  Sign up information is provided on this After Visit Summary.  MyChart is used to connect with patients for Virtual Visits (Telemedicine).  Patients are able to view lab/test results, encounter notes, upcoming appointments, etc.  Non-urgent messages can be sent to your provider as well.   To learn more about what you can do with MyChart, go to [email or web address].    Your next appointment:   6 month(s)  The format for your next appointment:   In Person  Provider:   Eleonore Chiquito, MD      Time Spent with Patient: I have spent a total of 35 minutes with patient reviewing hospital notes,  telemetry, EKGs, labs and examining the patient as well as establishing an assessment and plan that was discussed with the patient.  > 50% of time was spent in direct patient care.  Signed, [place]  Doreatha Lew, MD, [place]  [address], [place], [place] [address] [phone]  05/31/2021 2:42 PM

## 2021-05-31 ENCOUNTER — Other Ambulatory Visit: Payer: Self-pay

## 2021-05-31 ENCOUNTER — Encounter: Payer: Self-pay | Admitting: Cardiovascular Disease

## 2021-05-31 ENCOUNTER — Ambulatory Visit: Payer: Medicare HMO | Admitting: Cardiovascular Disease

## 2021-05-31 VITALS — BP 120/72 | HR 84 | Ht 60.0 in | Wt 92.8 lb

## 2021-05-31 DIAGNOSIS — B3323 Viral pericarditis: Secondary | ICD-10-CM

## 2021-05-31 DIAGNOSIS — Z9889 Other specified postprocedural states: Secondary | ICD-10-CM

## 2021-05-31 MED ORDER — COLCHICINE 0.6 MG PO CAPS: 0.6000 mg | ORAL_CAPSULE | Freq: Every day | ORAL | 0 refills | Status: DC

## 2021-05-31 NOTE — Patient Instructions (Signed)
Medication Instructions:  Continue colchicine for the next 3 months, then stop.  *If you need a refill on your cardiac medications before your next appointment, please call your pharmacy*   Lab Work: CRP, ESR today   If you have labs (blood work) drawn today and your tests are completely normal, you will receive your results only by: [place] (if you have MyChart) OR A paper copy in the mail If you have any lab test that is abnormal or we need to change your treatment, we will call you to review the results.   Follow-Up: At [facility], you and your health needs are our priority.  As part of our continuing mission to provide you with exceptional heart care, we have created designated Provider Care Teams.  These Care Teams include your primary Cardiologist (physician) and Advanced Practice Providers (APPs -  Physician Assistants and Nurse Practitioners) who all work together to provide you with the care you need, when you need it.  We recommend signing up for the patient portal called "MyChart".  Sign up information is provided on this After Visit Summary.  MyChart is used to connect with patients for Virtual Visits (Telemedicine).  Patients are able to view lab/test results, encounter notes, upcoming appointments, etc.  Non-urgent messages can be sent to your provider as well.   To learn more about what you can do with MyChart, go to [email or web address].    Your next appointment:   6 month(s)  The format for your next appointment:   In Person  Provider:   Eleonore Chiquito, MD

## 2021-06-01 LAB — C-REACTIVE PROTEIN: CRP: 2 mg/L (ref 0–10)

## 2021-06-01 LAB — SEDIMENTATION RATE: Sed Rate: 72 mm/hr — ABNORMAL HIGH (ref 0–40)

## 2021-06-17 ENCOUNTER — Ambulatory Visit
Admission: RE | Admit: 2021-06-17 | Discharge: 2021-06-17 | Disposition: A | Discharge status: Home / Self Care | Payer: Medicare HMO | Source: Ambulatory Visit | Attending: Pulmonary Disease | Admitting: Pulmonary Disease

## 2021-06-17 DIAGNOSIS — R0602 Shortness of breath: Secondary | ICD-10-CM

## 2021-07-11 ENCOUNTER — Ambulatory Visit: Payer: Medicare HMO | Admitting: Pulmonary Disease

## 2021-07-11 ENCOUNTER — Telehealth: Payer: Self-pay | Admitting: Pulmonary Disease

## 2021-07-12 NOTE — Telephone Encounter (Signed)
ATC LVMTCB x 1  

## 2021-08-24 IMAGING — US US BREAST*L* LIMITED INC AXILLA
1 series · 10 of 10 positions shown · non-contrast
Comparison: Previous exam(s).

CLINICAL DATA: Patient presents with a new palpable mass in the
left breast.

EXAM:
DIGITAL DIAGNOSTIC BILATERAL MAMMOGRAM WITH CAD AND TOMO
ULTRASOUND BILATERAL BREAST

[Series 1: us breast*left* limited inc axilla · 0.06mm/px · 10 of 10 slices shown]
[im 1/10]
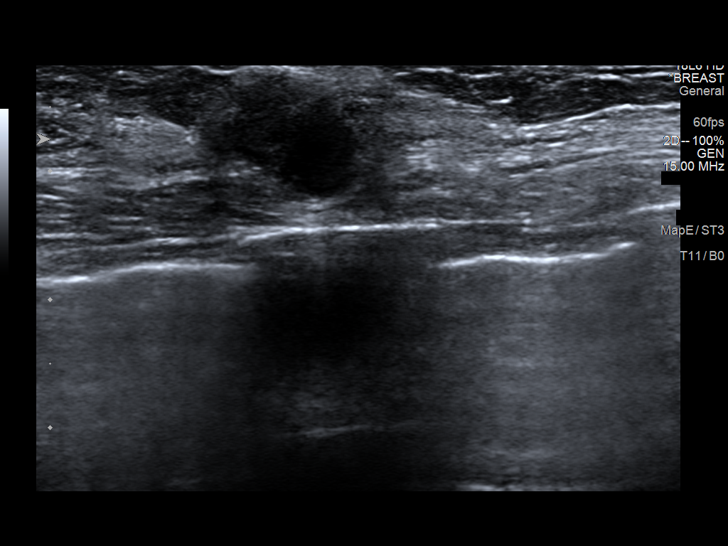
[im 2/10]
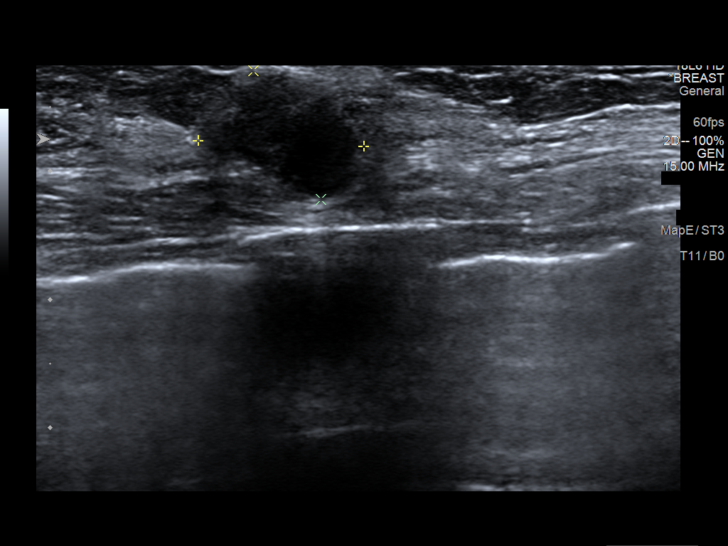
[im 3/10]
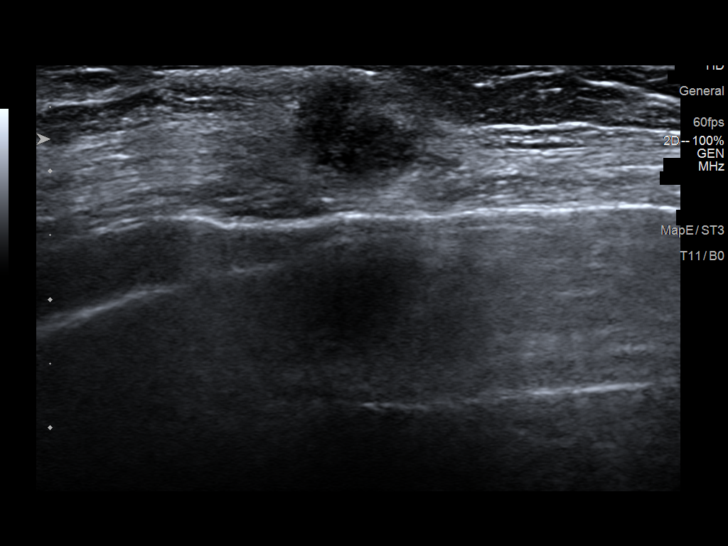
[im 4/10]
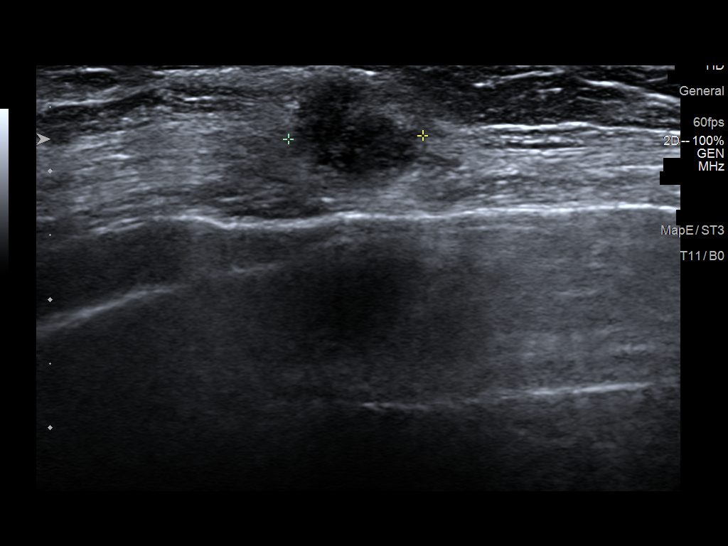
[im 5/10]
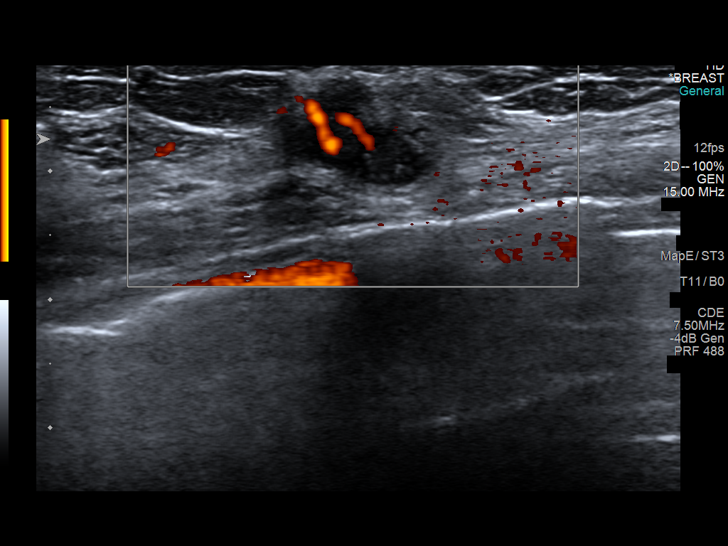
[im 6/10]
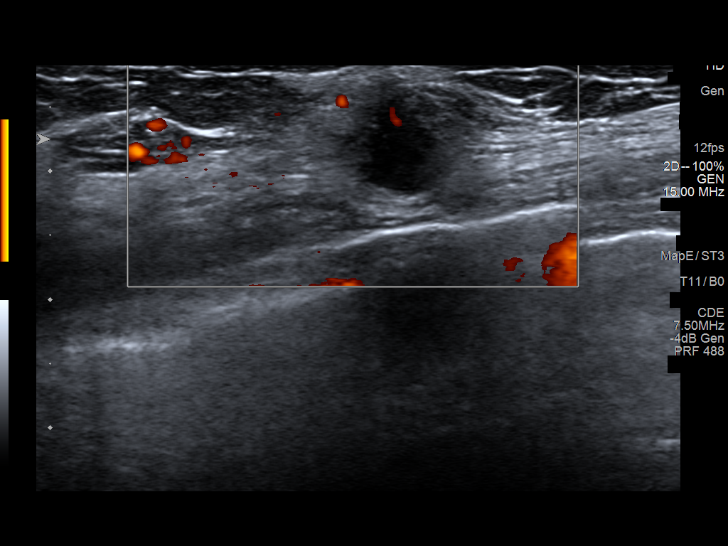
[im 7/10]
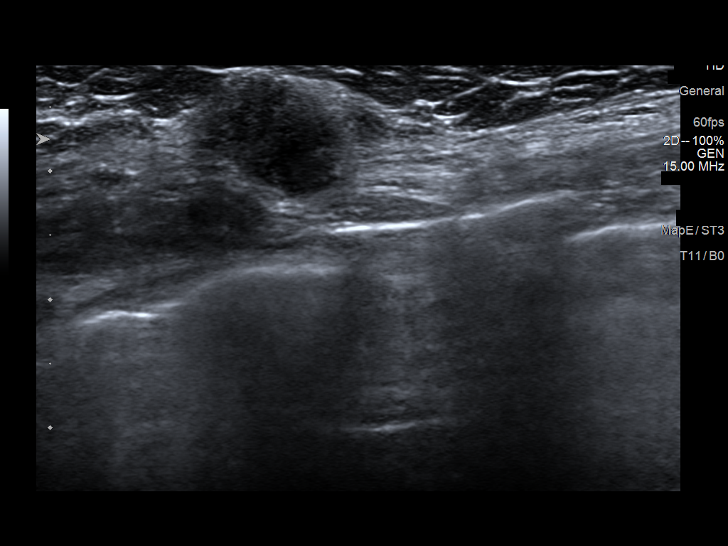
[im 8/10]
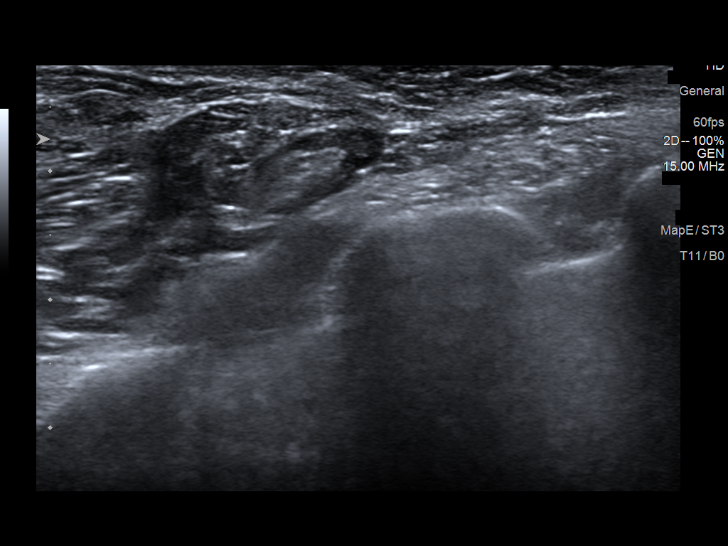
[im 9/10]
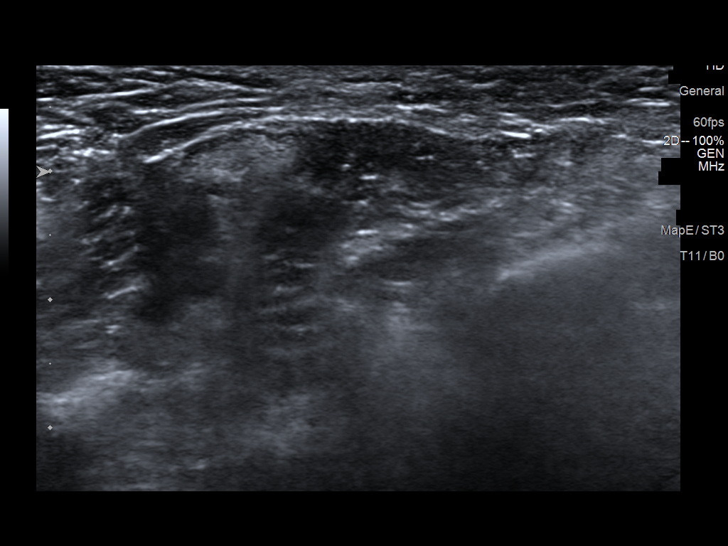
[im 10/10]
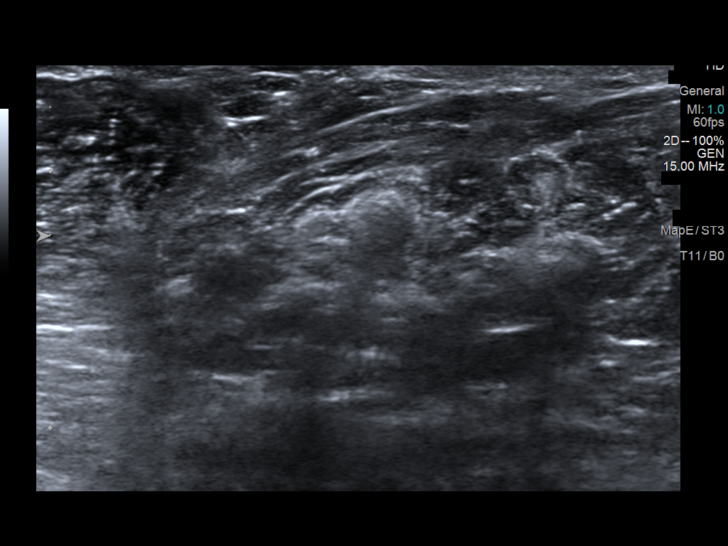

[10 of 10 positions shown; findings below may reference images not displayed]

ACR Breast Density Category b: There are scattered areas of
fibroglandular density.
FINDINGS: Mammogram:

Right breast: In the outer aspect of the right breast there is an
oval mass measuring 0.7 cm. Additionally in the central inferior
aspect of the right breast there is an oval mass measuring
approximately 0.9 cm. The calcifications associated with the mass
span approximately 2.0 cm. No additional findings in the right
breast.

Left breast: The palpable site of concern in the upper outer left
breast there is an irregular spiculated mass with calcifications
measuring approximately 1.4 cm.

Mammographic images were processed with CAD.

On physical exam, I palpate a fixed discrete mass in the upper outer
quadrant at the palpable site of concern.

Ultrasound:

Targeted ultrasound is performed in the right breast at 6 o'clock 1
cm from the nipple demonstrating an oval circumscribed anechoic mass
measuring 0.9 x 0.5 x 1.0 cm, consistent with a simple cyst, which
corresponds to the mammographic finding. At 9 o'clock 4 cm from the
nipple there is an oval circumscribed anechoic mass with some
internal debris overall measuring 0.7 x 0.1 x 0.6 cm, consistent
with a cyst, which corresponds to the mammographic finding.

Targeted ultrasound of the left breast at 2 o'clock 7 cm from nipple
at the palpable site of concern demonstrates an irregular spiculated
mass with internal blood flow measuring 1.3 x 1.1 x 1.1 cm, which
corresponds to the mammographic finding. Targeted ultrasound of the
left axilla demonstrates no suspicious appearing lymph nodes.
IMPRESSION: 1. Left breast mass at 2 o'clock measuring 1.3 cm is highly
suspicious. Associated calcifications with the mass span 2.0 cm.

2. No mammographic or sonographic evidence of malignancy in the
right breast.

RECOMMENDATION:
Ultrasound-guided core needle biopsy of the left breast mass at 2
o'clock.

I have discussed the findings and recommendations with the patient.
If applicable, a reminder letter will be sent to the patient
regarding the next appointment.

BI-RADS CATEGORY  5: Highly suggestive of malignancy.

## 2021-08-25 IMAGING — US US BREAST BX W LOC DEV 1ST LESION IMG BX SPEC US GUIDE*L*
1 series · 10 of 10 positions shown · non-contrast
Comparison: Previous exam(s).
COMPARISON: Previous exam(s).

Addendum:
CLINICAL DATA: 78-year-old female with a suspicious mass in the
left breast at the 2 o'clock position.

EXAM:
ULTRASOUND GUIDED LEFT BREAST CORE NEEDLE BIOPSY

[Series 1: us breast bx w loc dev 1st lesion img bx spec us g · 0.06mm/px · 10 of 10 slices shown]
[im 1/10]
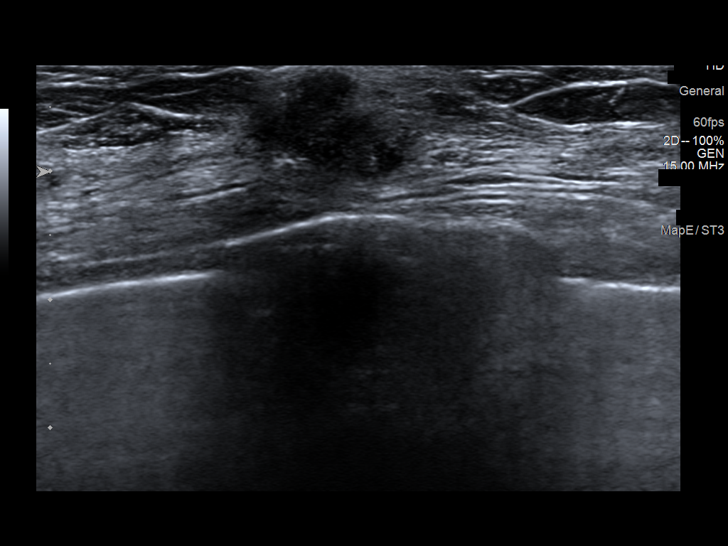
[im 2/10]
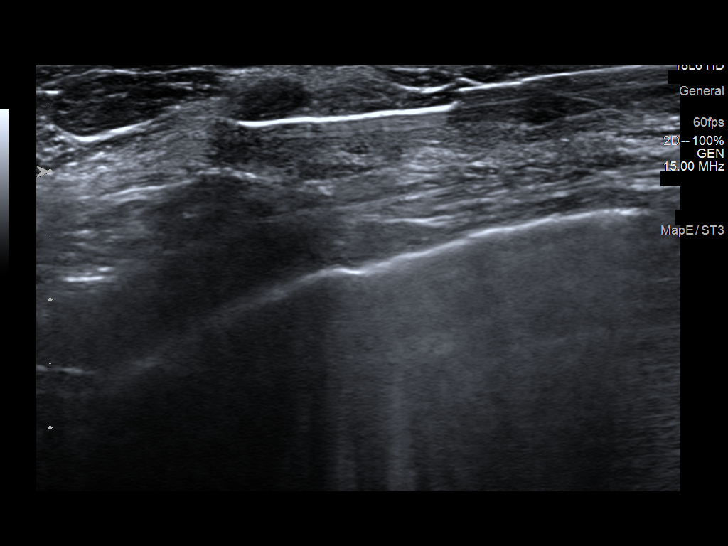
[im 3/10]
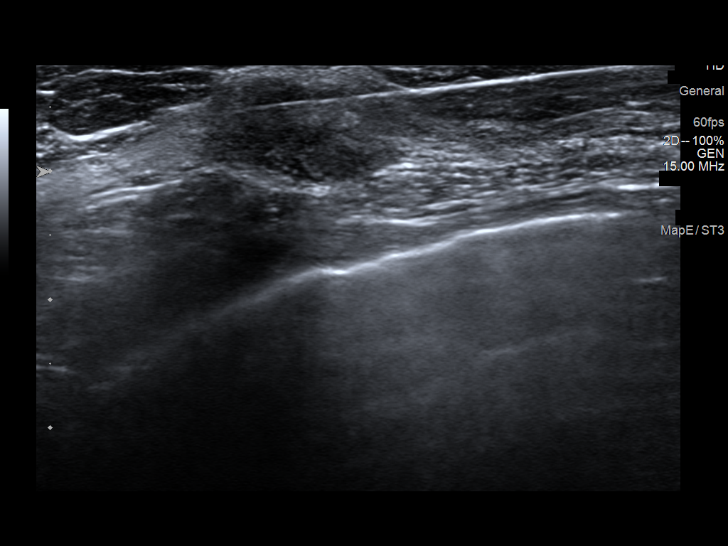
[im 4/10]
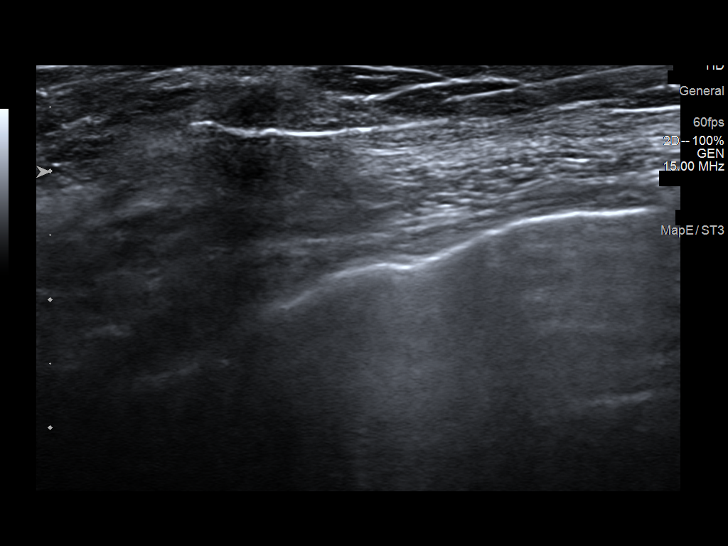
[im 5/10]
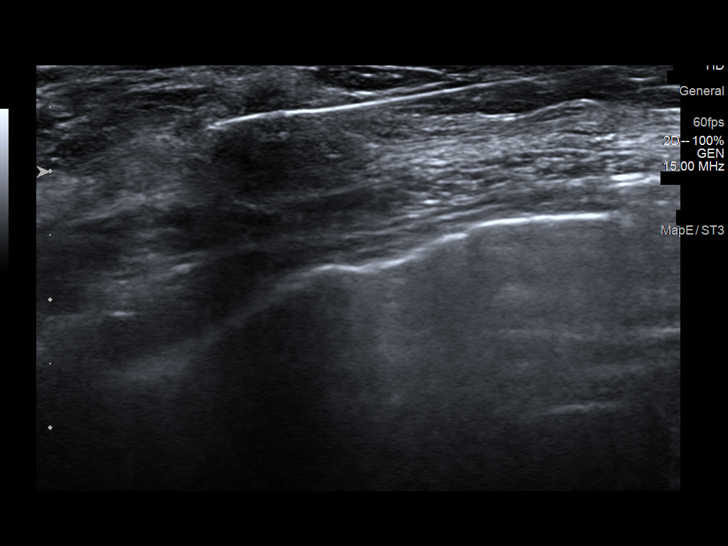
[im 6/10]
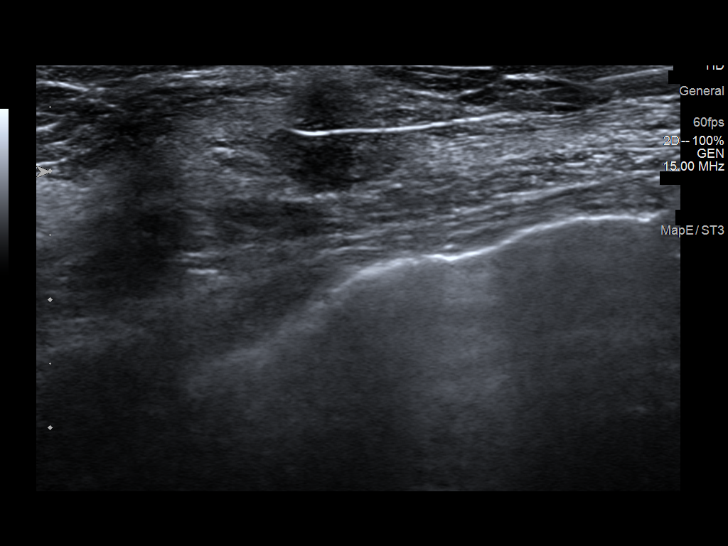
[im 7/10]
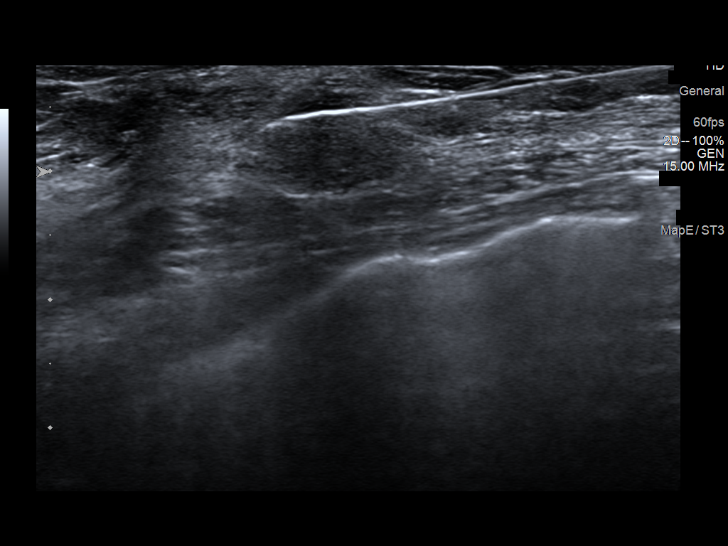
[im 8/10]
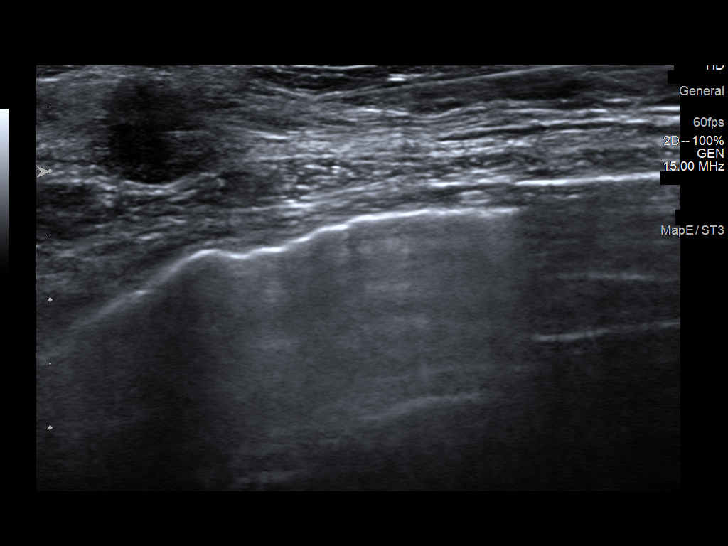
[im 9/10]
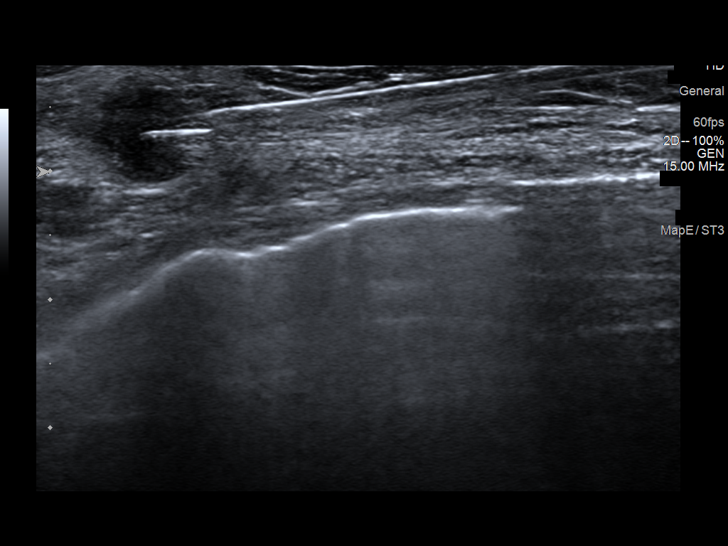
[im 10/10]
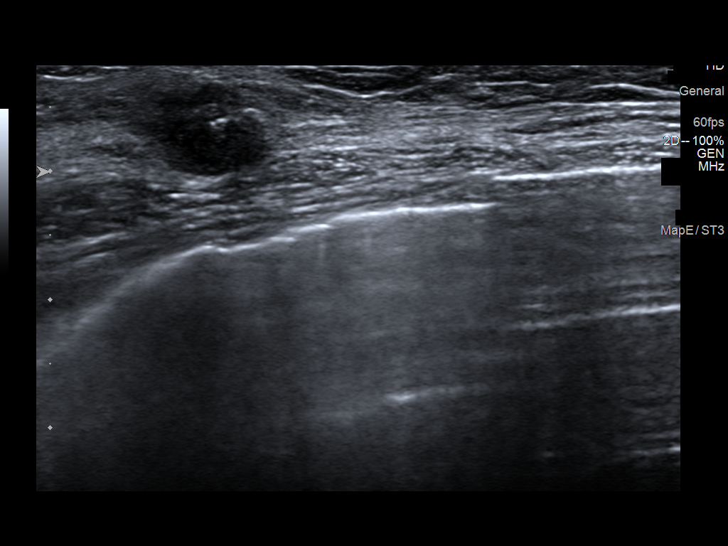

[10 of 10 positions shown; findings below may reference images not displayed]



Lesion quadrant: Upper-outer

Using sterile technique and 1% Lidocaine as local anesthetic, under
direct ultrasound visualization, a 14 gauge Fenton device was
used to perform biopsy of mass in the left breast at the 2 o'clock
position using a lateral to medial approach. At the conclusion of
the procedure ribbon shaped tissue marker clip was deployed into the
biopsy cavity. Follow up 2 view mammogram was performed and dictated
separately.
IMPRESSION: Ultrasound guided biopsy of the mass in the left breast at the 2
o'clock position. No apparent complications.

ADDENDUM:
Pathology revealed GRADE II INVASIVE MAMMARY CARCINOMA of the Left
breast, 2 o'clock. This was found to be concordant by Dr. Thiel
Alla.

Pathology results were discussed with the patient by telephone. The
patient reported doing well after the biopsy with tenderness at the
site. Post biopsy instructions and care were reviewed and questions
were answered. The patient was encouraged to call The [REDACTED]

The patient was referred to [REDACTED]
[REDACTED] at [REDACTED] on
March 23, 2019.

Pathology results reported by Deeqa Rayaan Adlaho, RN on 03/16/2019.



Lesion quadrant: Upper-outer

Using sterile technique and 1% Lidocaine as local anesthetic, under
direct ultrasound visualization, a 14 gauge Fenton device was
used to perform biopsy of mass in the left breast at the 2 o'clock
position using a lateral to medial approach. At the conclusion of
the procedure ribbon shaped tissue marker clip was deployed into the
biopsy cavity. Follow up 2 view mammogram was performed and dictated
separately.
IMPRESSION: Ultrasound guided biopsy of the mass in the left breast at the 2
o'clock position. No apparent complications.

## 2021-09-06 ENCOUNTER — Ambulatory Visit (INDEPENDENT_AMBULATORY_CARE_PROVIDER_SITE_OTHER): Payer: Medicare HMO | Admitting: Pulmonary Disease

## 2021-09-06 ENCOUNTER — Encounter: Payer: Self-pay | Admitting: Pulmonary Disease

## 2021-09-06 VITALS — BP 120/62 | HR 83 | Temp 97.8°F | Ht 60.0 in | Wt 96.0 lb

## 2021-09-06 DIAGNOSIS — R0602 Shortness of breath: Secondary | ICD-10-CM | POA: Diagnosis not present

## 2021-09-06 DIAGNOSIS — J449 Chronic obstructive pulmonary disease, unspecified: Secondary | ICD-10-CM

## 2021-09-06 LAB — PULMONARY FUNCTION TEST
DL/VA % pred: 66 %
DL/VA: 2.77 ml/min/mmHg/L
DLCO cor % pred: 55 %
DLCO cor: 9.09 ml/min/mmHg
DLCO unc % pred: 55 %
DLCO unc: 9.09 ml/min/mmHg
FEF 25-75 Post: 0.61 L/sec
FEF 25-75 Pre: 0.63 L/sec
FEF2575-%Change-Post: -3 %
FEF2575-%Pred-Post: 63 %
FEF2575-%Pred-Pre: 65 %
FEV1-%Change-Post: -1 %
FEV1-%Pred-Post: 104 %
FEV1-%Pred-Pre: 105 %
FEV1-Post: 1.21 L
FEV1-Pre: 1.22 L
FEV1FVC-%Change-Post: 0 %
FEV1FVC-%Pred-Pre: 83 %
FEV6-%Change-Post: -1 %
FEV6-%Pred-Post: 134 %
FEV6-%Pred-Pre: 136 %
FEV6-Post: 1.92 L
FEV6-Pre: 1.95 L
FEV6FVC-%Pred-Post: 105 %
FEV6FVC-%Pred-Pre: 105 %
FVC-%Change-Post: -1 %
FVC-%Pred-Post: 126 %
FVC-%Pred-Pre: 129 %
FVC-Post: 1.92 L
FVC-Pre: 1.95 L
Post FEV1/FVC ratio: 63 %
Post FEV6/FVC ratio: 100 %
Pre FEV1/FVC ratio: 62 %
Pre FEV6/FVC Ratio: 100 %

## 2021-09-06 NOTE — Patient Instructions (Signed)
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  I do not believe we need to start inhalers at this point  Continue to eat healthy and exercise Follow-up in 6 months

## 2021-09-06 NOTE — Patient Instructions (Signed)
Full PFT attempted patient was not able to perform lung volumes. Spirometry pre/post and dlco done.

## 2021-09-06 NOTE — Progress Notes (Signed)
[place]    [id number]    December 03, 1940  Primary Care Physician:Reese, Zadie Cleverly, MD  Referring Physician: Lin Landsman, [address],  [place] [address]  Chief complaint: Follow up for abnormal CT, emphysema  HPI: 81 year old with history of asthma, hypertension, stroke She had recent admission in 12/1 to 04/06/22 with flu infection, noted to have pericarditis with trivial pericardial effusion.  Treated with prednisone, colchicine for postviral pericarditis.  However repeat echocardiogram in 04/01/2021 showed Arash effusion with tamponade and underwent VATS and pericardial window on 04/02/2021.  She was readmitted later in December with dizziness and found to be COVID-positive.  Treated with IV fluids and supportive care.  She received 3 days of antibiotics for possible infiltrate on CT scan  She has been referred to [facility] for evaluation of possible ILD on CT scan and follow-up for calcified granuloma States that breathing is doing well with no issues.  She has made good recovery  Pets: No pets Occupation: Used to work as a Engineer, building services Exposures: No significant ongoing exposure history.  She was exposed to mold 4 years ago at her old house Smoking history: 30-pack-year smoker.  Quit in 1980s Travel history: No significant travel history Relevant family history: No family history of lung disease  Interim history: She is doing better in terms of her breathing.  She is exercising at home and staying active Here for review of CT and PFTs.  Outpatient Encounter Medications as of 09/06/2021  Medication Sig   anastrozole (ARIMIDEX) 1 MG tablet Take 1 tablet (1 mg total) by mouth daily.   Colchicine (MITIGARE) 0.6 MG CAPS Take 0.6 mg by mouth daily at 6 (six) AM.   DM-Phenylephrine-Acetaminophen (VICKS DAYQUIL COLD & FLU PO) Take 1 Dose by mouth 3 (three) times daily as needed (cough/fever/cold symptoms).   loratadine (CLARITIN) 10 MG tablet Take 1 tablet (10  mg total) by mouth daily.   Multiple Vitamin (MULTIVITAMIN WITH MINERALS) TABS tablet Take 1 tablet by mouth daily.   MYRBETRIQ 25 MG TB24 tablet Take 25 mg by mouth daily.   pantoprazole (PROTONIX) 40 MG tablet Take 1 tablet (40 mg total) by mouth daily.   polyethylene glycol (MIRALAX / GLYCOLAX) 17 g packet Take 17 g by mouth daily as needed. (Patient taking differently: Take 17 g by mouth daily as needed for moderate constipation.)   senna-docusate (SENOKOT-S) 8.6-50 MG tablet Take 1 tablet by mouth 2 (two) times daily.   No facility-administered encounter medications on file as of 09/06/2021.    Physical Exam: Blood pressure 120/62, pulse 83, temperature 97.8 F (36.6 C), temperature source Oral, height 5' (1.524 m), weight 96 lb (43.5 kg), SpO2 93 %. Gen:      No acute distress HEENT:  EOMI, sclera anicteric Neck:     No masses; no thyromegaly Lungs:    Clear to auscultation bilaterally; normal respiratory effort CV:         Regular rate and rhythm; no murmurs Abd:      + bowel sounds; soft, non-tender; no palpable masses, no distension Ext:    No edema; adequate peripheral perfusion Skin:      Warm and dry; no rash Neuro: alert and oriented x 3 Psych: normal mood and affect   Data Reviewed: Imaging: CT chest 03/21/2021-small pericardial effusion with cardiomegaly, calcified granuloma in the left lung  CTA 04/17/2021-no pulmonary embolism, mild mediastinal and infrahilar adenopathy, atelectasis  in the right middle lobe with airspace disease  CT high-resolution 06/17/2021-mild postinfectious inflammatory scarring.  Stable pulmonary nodule, emphysema I have reviewed the images personally  PFTs: 09/06/2021 FVC 1.92 [126%], FEV1 1.21 [104%], F/F 63, DLCO 9.09 [55%] Mild obstruction, moderate diffusion defect.  Labs:  Assessment:  Consult for abnormal CT CT reviewed with no evidence of interstitial lung disease.  There is mild postinfectious scarring and stable lung  nodule  COPD She has mild obstruction on PFTs but she is symptomatic and does not need inhalers at present Advised exercise at home  Pericardial effusion Secondary to viral pericarditis.  S/p VATS and drainage Monitor.  Plan/Recommendations: Exercise, stay active Follow-up in 6 months  Marshell Garfinkel MD [facility] 09/06/2021, 2:03 PM  CC: Lin Landsman, MD

## 2021-09-06 NOTE — Progress Notes (Signed)
Full PFT attempted patient was not able to perform lung volumes. Spirometry pre/post and dlco done.

## 2021-10-30 ENCOUNTER — Telehealth: Payer: Self-pay | Admitting: Hematology and Oncology

## 2021-10-30 NOTE — Telephone Encounter (Signed)
Rescheduled appointment per provider covering [facility]. Left voicemail with the patients daughter [name] at [phone].

## 2021-11-06 ENCOUNTER — Ambulatory Visit: Payer: Medicare HMO | Admitting: Hematology and Oncology

## 2021-11-15 ENCOUNTER — Inpatient Hospital Stay: Payer: Medicare HMO | Attending: Hematology and Oncology | Admitting: Hematology and Oncology

## 2021-11-15 NOTE — Assessment & Plan Note (Deleted)
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  Current treatment: Anastrozole 1 mg daily started 04/13/2019  Anastrozole toxicities: Denies any adverse effects of anastrozole therapy. Denies any hot flashes or arthralgias or myalgias.  Breast cancer surveillance: 1.Breast exam 11/15/2021: Benign 2.mammogram: Patient does not want to do any further mammograms.  This is by her choice.  Breast MRI was attempted 05/23/2019 with motion artifact and patient declined a repeat MRI. Hospitalization 03/21/2021-04/06/2021: Pericardial effusion (post influenza) complicated by tamponade status post VATS and pericardial window Hospitalization 04/09/2021-04/20/2021 right lung pneumonia  Return to clinic in 1 year for follow-up

## 2021-12-04 NOTE — Telephone Encounter (Signed)
Pt has had an OV with office since call. Closing encounter.

## 2022-03-03 ENCOUNTER — Ambulatory Visit: Payer: Medicare HMO | Admitting: Pulmonary Disease

## 2023-03-05 ENCOUNTER — Encounter (HOSPITAL_COMMUNITY): Payer: Self-pay | Admitting: Emergency Medicine

## 2023-03-05 ENCOUNTER — Emergency Department (HOSPITAL_COMMUNITY)
Admission: EM | Admit: 2023-03-05 | Discharge: 2023-03-05 | Disposition: A | Discharge status: Home / Self Care | Payer: Medicare HMO

## 2023-03-05 ENCOUNTER — Other Ambulatory Visit: Payer: Self-pay

## 2023-03-05 ENCOUNTER — Emergency Department (HOSPITAL_COMMUNITY): Payer: Medicare HMO

## 2023-03-05 DIAGNOSIS — S060X0A Concussion without loss of consciousness, initial encounter: Secondary | ICD-10-CM

## 2023-03-05 DIAGNOSIS — S0990XA Unspecified injury of head, initial encounter: Secondary | ICD-10-CM | POA: Diagnosis present

## 2023-03-05 DIAGNOSIS — S0083XA Contusion of other part of head, initial encounter: Secondary | ICD-10-CM | POA: Insufficient documentation

## 2023-03-05 MED ORDER — IBUPROFEN 200 MG PO TABS
400.0000 mg | ORAL_TABLET | Freq: Once | ORAL | Status: AC
  Administered 2023-03-05: 400 mg via ORAL
  Filled 2023-03-05: qty 2

## 2023-03-05 MED ORDER — ACETAMINOPHEN 325 MG PO TABS
650.0000 mg | ORAL_TABLET | Freq: Once | ORAL | Status: AC
Start: 2023-03-05 — End: 2023-03-05
  Administered 2023-03-05: 650 mg via ORAL
  Filled 2023-03-05: qty 2
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
## 2023-03-05 NOTE — Discharge Instructions (Addendum)
Abnormal CT head was negative for acute traumatic injury.  As such, we feel that you are stable for discharge.  You likely have a concussion.  May take over-the-counter Tylenol alternating with Motrin.  As discussed please allow your brain to rest for the next several days.  Return immediately if develop fevers, chills, sudden change/onset of severe headache, you develop facial droop, unilateral weakness, chest pain, shortness of breath, abdominal pain or any new or worsening symptoms that are concerning to you.

## 2023-03-05 NOTE — ED Notes (Signed)
Provider at bedside

## 2023-03-18 ENCOUNTER — Other Ambulatory Visit: Payer: Self-pay

## 2023-03-18 ENCOUNTER — Emergency Department (HOSPITAL_COMMUNITY)
Admission: EM | Admit: 2023-03-18 | Discharge: 2023-03-19 | Disposition: A | Discharge status: Other Acute Inpatient Hospital | Payer: Medicare HMO | Attending: Emergency Medicine | Admitting: Emergency Medicine

## 2023-03-18 ENCOUNTER — Emergency Department (HOSPITAL_COMMUNITY): Payer: Medicare HMO

## 2023-03-18 ENCOUNTER — Encounter (HOSPITAL_COMMUNITY): Payer: Self-pay

## 2023-03-18 DIAGNOSIS — D509 Iron deficiency anemia, unspecified: Secondary | ICD-10-CM | POA: Diagnosis not present

## 2023-03-18 DIAGNOSIS — J45909 Unspecified asthma, uncomplicated: Secondary | ICD-10-CM | POA: Diagnosis not present

## 2023-03-18 DIAGNOSIS — H409 Unspecified glaucoma: Secondary | ICD-10-CM | POA: Insufficient documentation

## 2023-03-18 DIAGNOSIS — I10 Essential (primary) hypertension: Secondary | ICD-10-CM | POA: Diagnosis not present

## 2023-03-18 DIAGNOSIS — D72829 Elevated white blood cell count, unspecified: Secondary | ICD-10-CM | POA: Insufficient documentation

## 2023-03-18 DIAGNOSIS — Z853 Personal history of malignant neoplasm of breast: Secondary | ICD-10-CM | POA: Insufficient documentation

## 2023-03-18 DIAGNOSIS — Z8673 Personal history of transient ischemic attack (TIA), and cerebral infarction without residual deficits: Secondary | ICD-10-CM | POA: Insufficient documentation

## 2023-03-18 DIAGNOSIS — R519 Headache, unspecified: Secondary | ICD-10-CM | POA: Diagnosis present

## 2023-03-18 LAB — CBC WITH DIFFERENTIAL/PLATELET
Abs Immature Granulocytes: 0.02 10*3/uL (ref 0.00–0.07)
Basophils Absolute: 0.1 10*3/uL (ref 0.0–0.1)
Basophils Relative: 1 %
Eosinophils Absolute: 0.3 10*3/uL (ref 0.0–0.5)
Eosinophils Relative: 3 %
HCT: 34.5 % — ABNORMAL LOW (ref 36.0–46.0)
Hemoglobin: 11.2 g/dL — ABNORMAL LOW (ref 12.0–15.0)
Immature Granulocytes: 0 %
Lymphocytes Relative: 16 %
Lymphs Abs: 1.7 10*3/uL (ref 0.7–4.0)
MCH: 25.1 pg — ABNORMAL LOW (ref 26.0–34.0)
MCHC: 32.5 g/dL (ref 30.0–36.0)
MCV: 77.4 fL — ABNORMAL LOW (ref 80.0–100.0)
Monocytes Absolute: 0.4 10*3/uL (ref 0.1–1.0)
Monocytes Relative: 3 %
Neutro Abs: 8.4 10*3/uL — ABNORMAL HIGH (ref 1.7–7.7)
Neutrophils Relative %: 77 %
Platelets: 315 10*3/uL (ref 150–400)
RBC: 4.46 MIL/uL (ref 3.87–5.11)
RDW: 17.4 % — ABNORMAL HIGH (ref 11.5–15.5)
WBC: 10.9 10*3/uL — ABNORMAL HIGH (ref 4.0–10.5)
nRBC: 0 % (ref 0.0–0.2)

## 2023-03-18 LAB — BASIC METABOLIC PANEL
Anion gap: 8 (ref 5–15)
BUN: 25 mg/dL — ABNORMAL HIGH (ref 8–23)
CO2: 25 mmol/L (ref 22–32)
Calcium: 9.1 mg/dL (ref 8.9–10.3)
Chloride: 105 mmol/L (ref 98–111)
Creatinine, Ser: 0.79 mg/dL (ref 0.44–1.00)
GFR, Estimated: >60 mL/min (ref >60)
Glucose, Bld: 124 mg/dL — ABNORMAL HIGH (ref 70–99)
Potassium: 3.6 mmol/L (ref 3.5–5.1)
Sodium: 138 mmol/L (ref 135–145)

## 2023-03-18 MED ORDER — FENTANYL CITRATE PF 50 MCG/ML IJ SOSY
25.0000 ug | PREFILLED_SYRINGE | Freq: Once | INTRAMUSCULAR | Status: AC
  Administered 2023-03-18: 25 ug via INTRAVENOUS
  Filled 2023-03-18: qty 1

## 2023-03-18 MED ORDER — ACETAZOLAMIDE SODIUM 500 MG IJ SOLR
500.0000 mg | Freq: Once | INTRAMUSCULAR | Status: AC
  Administered 2023-03-18: 500 mg via INTRAVENOUS
  Filled 2023-03-18: qty 500

## 2023-03-18 MED ORDER — STERILE WATER FOR INJECTION IJ SOLN
INTRAMUSCULAR | Status: AC
  Filled 2023-03-18: qty 10

## 2023-03-18 MED ORDER — TETRACAINE HCL 0.5 % OP SOLN
2.0000 [drp] | Freq: Once | OPHTHALMIC | Status: AC
  Administered 2023-03-18: 2 [drp] via OPHTHALMIC
  Filled 2023-03-18: qty 4

## 2023-03-18 MED ORDER — FLUORESCEIN SODIUM 1 MG OP STRP
1.0000 | ORAL_STRIP | Freq: Once | OPHTHALMIC | Status: AC
Start: 2023-03-18 — End: 2023-03-18
  Administered 2023-03-18: 1 via OPHTHALMIC
  Filled 2023-03-18: qty 1
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
## 2023-03-18 NOTE — ED Provider Notes (Signed)
Care assumed from Dr. Jacqulyn Bath, patient with acute glaucoma of right eye, currently being managed by ophthalmology.  Ocular pressures have decreased slightly, but not sufficiently.  I have ordered a dose of mannitol.  She will need to be admitted.  This is a complicated case, will need to be transferred to [facility].  Patient has been accepted by Dr. Larene Beach of ophthalmology service.  CRITICAL CARE Performed by: Dione Booze Total critical care time: 35 minutes Critical care time was exclusive of separately billable procedures and treating other patients. Critical care was necessary to treat or prevent imminent or life-threatening deterioration. Critical care was time spent personally by me on the following activities: development of treatment plan with patient and/or surrogate as well as nursing, discussions with consultants, evaluation of patient's response to treatment, examination of patient, obtaining history from patient or surrogate, ordering and performing treatments and interventions, ordering and review of laboratory studies, ordering and review of radiographic studies, pulse oximetry and re-evaluation of patient's condition.   Dione Booze, MD 03/19/23 [phone]
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
## 2023-03-18 NOTE — ED Triage Notes (Signed)
Pt arrives via [facility] from home for c/o pain in both eyes x3 days. Pt had obvious redness and drainage to both eyes. Pt also reports sensitivity to light and requesting lights be turned off at this time. Pt attempted otc drops at home today given to her by her granddaughter. Pt observed wiping her nose and then her eyes with the same tissue so could be possible source of infection to eyes. Pt also reports that the pain in her eyes has been causing her to have a headache. Pt denies any fever, chills at this time.

## 2023-03-19 DIAGNOSIS — H409 Unspecified glaucoma: Secondary | ICD-10-CM | POA: Diagnosis not present

## 2023-03-19 DIAGNOSIS — R519 Headache, unspecified: Secondary | ICD-10-CM | POA: Diagnosis present

## 2023-03-19 DIAGNOSIS — J45909 Unspecified asthma, uncomplicated: Secondary | ICD-10-CM | POA: Diagnosis not present

## 2023-03-19 DIAGNOSIS — I10 Essential (primary) hypertension: Secondary | ICD-10-CM | POA: Diagnosis not present

## 2023-03-19 DIAGNOSIS — Z8673 Personal history of transient ischemic attack (TIA), and cerebral infarction without residual deficits: Secondary | ICD-10-CM | POA: Diagnosis not present

## 2023-03-19 DIAGNOSIS — Z853 Personal history of malignant neoplasm of breast: Secondary | ICD-10-CM | POA: Diagnosis not present

## 2023-03-19 DIAGNOSIS — D72829 Elevated white blood cell count, unspecified: Secondary | ICD-10-CM | POA: Diagnosis not present

## 2023-03-19 DIAGNOSIS — D509 Iron deficiency anemia, unspecified: Secondary | ICD-10-CM | POA: Diagnosis not present

## 2023-03-19 LAB — URINALYSIS, ROUTINE W REFLEX MICROSCOPIC
Bacteria, UA: NONE SEEN
Bilirubin Urine: NEGATIVE
Glucose, UA: NEGATIVE mg/dL
Ketones, ur: NEGATIVE mg/dL
Leukocytes,Ua: NEGATIVE
Nitrite: NEGATIVE
Protein, ur: 100 mg/dL — AB
RBC / HPF: >50 RBC/hpf (ref 0–5)
Specific Gravity, Urine: 1.017 (ref 1.005–1.030)
pH: 8 (ref 5.0–8.0)

## 2023-03-19 MED ORDER — MANNITOL 25 % IV SOLN
50.0000 g | Freq: Once | INTRAVENOUS | Status: AC
  Administered 2023-03-19: 50 g via INTRAVENOUS
  Filled 2023-03-19: qty 200

## 2023-03-19 MED ORDER — ACETAMINOPHEN 325 MG PO TABS
650.0000 mg | ORAL_TABLET | Freq: Once | ORAL | Status: AC
  Administered 2023-03-19: 650 mg via ORAL
  Filled 2023-03-19: qty 2

## 2023-03-19 NOTE — ED Notes (Signed)
Pt reports immediate intense pain in her urethra causing pt to be severely hypertensive, severely anxious, and pt is unable to be verbally redirected to lay in the bed and attempt deep breathing exercises. Dr. Preston Fleeting notified and VOR for bladder scanner received and informed that since pt has made numerous trips to the rest room tonight prior to IV diuretics if value > to place catheter in pt. Pt voided prior to this nurse placing catheter after first bladder scan and reveals . Catheter placed and pink turbid urine returned at this time. Dr. Preston Fleeting notified and specimen for urinalysis sent to lab. Pt pain subsided immediately after catheter placed. TJ Rivers, Press photographer present during placement of catheter.

## 2023-03-19 NOTE — ED Notes (Signed)
Dr. Preston Fleeting notified that pt reporting dyspnea at rest at this time, and states "its been going on for a little while tonight" pt also reports tightness/heaviness in her chest while ambulating. Pt informed that these are important changes nursing staff needs to be made aware of when they start so physician can be updated for further assessment. Pt in NAD at this time, resting comfortably with even and unlabored respirations at rest. EKG given to provider.

## 2023-03-24 ENCOUNTER — Other Ambulatory Visit: Payer: Self-pay

## 2023-03-24 ENCOUNTER — Emergency Department (HOSPITAL_COMMUNITY)
Admission: EM | Admit: 2023-03-24 | Discharge: 2023-03-25 | Disposition: A | Discharge status: Home / Self Care | Payer: Medicare HMO | Attending: Emergency Medicine | Admitting: Emergency Medicine

## 2023-03-24 ENCOUNTER — Encounter (HOSPITAL_COMMUNITY): Payer: Self-pay

## 2023-03-24 DIAGNOSIS — N761 Subacute and chronic vaginitis: Secondary | ICD-10-CM | POA: Diagnosis not present

## 2023-03-24 DIAGNOSIS — R300 Dysuria: Secondary | ICD-10-CM

## 2023-03-24 DIAGNOSIS — R102 Pelvic and perineal pain: Secondary | ICD-10-CM | POA: Diagnosis present

## 2023-03-24 NOTE — ED Triage Notes (Signed)
Pt reports numbness and discoloration of fingers x 6 hours. Pt's finger tips are bluish in color and very cold to touch. Pt also reports numbness in fingers. Denies cold exposure. Radial pulses +strong bilaterally. Pt given hot packs for hands

## 2023-03-25 LAB — URINALYSIS, ROUTINE W REFLEX MICROSCOPIC
Bacteria, UA: NONE SEEN
Bilirubin Urine: NEGATIVE
Glucose, UA: NEGATIVE mg/dL
Ketones, ur: NEGATIVE mg/dL
Nitrite: NEGATIVE
Protein, ur: 100 mg/dL — AB
RBC / HPF: >50 RBC/hpf (ref 0–5)
Specific Gravity, Urine: 1.021 (ref 1.005–1.030)
pH: 5 (ref 5.0–8.0)

## 2023-03-25 LAB — BLOOD GAS, VENOUS
Acid-base deficit: 7.7 mmol/L — ABNORMAL HIGH (ref 0.0–2.0)
Bicarbonate: 18.8 mmol/L — ABNORMAL LOW (ref 20.0–28.0)
Drawn by: 7975
O2 Saturation: 45.2 %
Patient temperature: 37
pCO2, Ven: 41 mm[Hg] — ABNORMAL LOW (ref 44–60)
pH, Ven: 7.27 (ref 7.25–7.43)
pO2, Ven: 31 mm[Hg] — CL (ref 32–45)

## 2023-03-25 LAB — CBC WITH DIFFERENTIAL/PLATELET
Abs Immature Granulocytes: 0.04 10*3/uL (ref 0.00–0.07)
Basophils Absolute: 0.1 10*3/uL (ref 0.0–0.1)
Basophils Relative: 1 %
Eosinophils Absolute: 0.6 10*3/uL — ABNORMAL HIGH (ref 0.0–0.5)
Eosinophils Relative: 5 %
HCT: 34.7 % — ABNORMAL LOW (ref 36.0–46.0)
Hemoglobin: 11 g/dL — ABNORMAL LOW (ref 12.0–15.0)
Immature Granulocytes: 0 %
Lymphocytes Relative: 20 %
Lymphs Abs: 2.4 10*3/uL (ref 0.7–4.0)
MCH: 24.8 pg — ABNORMAL LOW (ref 26.0–34.0)
MCHC: 31.7 g/dL (ref 30.0–36.0)
MCV: 78.3 fL — ABNORMAL LOW (ref 80.0–100.0)
Monocytes Absolute: 0.7 10*3/uL (ref 0.1–1.0)
Monocytes Relative: 6 %
Neutro Abs: 8.2 10*3/uL — ABNORMAL HIGH (ref 1.7–7.7)
Neutrophils Relative %: 68 %
Platelets: 331 10*3/uL (ref 150–400)
RBC: 4.43 MIL/uL (ref 3.87–5.11)
RDW: 18.4 % — ABNORMAL HIGH (ref 11.5–15.5)
WBC: 12 10*3/uL — ABNORMAL HIGH (ref 4.0–10.5)
nRBC: 0 % (ref 0.0–0.2)

## 2023-03-25 LAB — COMPREHENSIVE METABOLIC PANEL
ALT: 15 U/L (ref 0–44)
AST: 17 U/L (ref 15–41)
Albumin: 3.7 g/dL (ref 3.5–5.0)
Alkaline Phosphatase: 76 U/L (ref 38–126)
Anion gap: 8 (ref 5–15)
BUN: 39 mg/dL — ABNORMAL HIGH (ref 8–23)
CO2: 17 mmol/L — ABNORMAL LOW (ref 22–32)
Calcium: 9.3 mg/dL (ref 8.9–10.3)
Chloride: 114 mmol/L — ABNORMAL HIGH (ref 98–111)
Creatinine, Ser: 0.91 mg/dL (ref 0.44–1.00)
GFR, Estimated: >60 mL/min (ref >60)
Glucose, Bld: 108 mg/dL — ABNORMAL HIGH (ref 70–99)
Potassium: 3.7 mmol/L (ref 3.5–5.1)
Sodium: 139 mmol/L (ref 135–145)
Total Bilirubin: 0.4 mg/dL (ref <1.2)
Total Protein: 7.9 g/dL (ref 6.5–8.1)

## 2023-03-25 LAB — LIPASE, BLOOD: Lipase: 48 U/L (ref 11–51)

## 2023-03-25 MED ORDER — LIDOCAINE HCL URETHRAL/MUCOSAL 2 % EX GEL
1.0000 | Freq: Once | CUTANEOUS | Status: AC
  Administered 2023-03-25: 1 via URETHRAL
  Filled 2023-03-25: qty 11

## 2023-03-25 MED ORDER — KETOROLAC TROMETHAMINE 30 MG/ML IJ SOLN
30.0000 mg | Freq: Once | INTRAMUSCULAR | Status: AC
  Administered 2023-03-25: 30 mg via INTRAVENOUS
  Filled 2023-03-25: qty 1

## 2023-03-25 MED ORDER — CEPHALEXIN 500 MG PO CAPS
500.0000 mg | ORAL_CAPSULE | Freq: Once | ORAL | Status: AC
Start: 2023-03-25 — End: 2023-03-25
  Administered 2023-03-25: 500 mg via ORAL
  Filled 2023-03-25: qty 1

## 2023-03-25 MED ORDER — PREDNISOLONE ACETATE 1 % OP SUSP
1.0000 [drp] | Freq: Four times a day (QID) | OPHTHALMIC | Status: DC
  Administered 2023-03-25: 1 [drp] via OPHTHALMIC
  Filled 2023-03-25: qty 5

## 2023-03-25 MED ORDER — CEPHALEXIN 500 MG PO CAPS: 500.0000 mg | ORAL_CAPSULE | Freq: Two times a day (BID) | ORAL | 0 refills | Status: AC

## 2023-03-25 MED ORDER — ONDANSETRON HCL 4 MG/2ML IJ SOLN
4.0000 mg | Freq: Once | INTRAMUSCULAR | Status: AC
  Administered 2023-03-25: 4 mg via INTRAVENOUS
  Filled 2023-03-25: qty 2

## 2023-03-25 MED ORDER — IBUPROFEN 600 MG PO TABS: 600.0000 mg | ORAL_TABLET | Freq: Three times a day (TID) | ORAL | 0 refills | Status: AC | PRN

## 2023-03-25 MED ORDER — ONDANSETRON 4 MG PO TBDP: 4.0000 mg | ORAL_TABLET | Freq: Three times a day (TID) | ORAL | 0 refills | Status: DC | PRN

## 2023-03-25 NOTE — ED Provider Notes (Signed)
[place] EMERGENCY DEPARTMENT AT [facility] Provider Note   CSN: [id number] Arrival date & time: 03/24/23  2041     History  Chief Complaint  Patient presents with   Hand Problem    Nicole Rush is a 82 y.o. female presented to ED complaining of multiple issues.  The patient is here primarily because she has been having pain rating down both of her legs from her lower back for the past week.  This began when she was in the hospital 1 week ago, diagnosed acute angle-closure glaucoma, was emergently transferred to [facility] where she was managed at the hospital.  She says she had a catheter placed then and the Foley produced blood from her urine, and ever since she had a Foley catheter placed she has been having pain around her vagina since then.  Her daughter at bedside reports the patient had a biopsy of the vaginal tumor about 2 weeks ago, and feels that when they put the Foley and it probably irritated the site.    She reports he did continue to bloody urine and told the past day and it is since cleared up.  She denies prior issues with her lower back.  She also reports she has had nausea and vomiting for the past 1 to 2 days.  She reports concern that she is having bluish discoloration in her fingertips for an unclear amount of time, although she says she has similar episode several months ago.  Denies any known history of Raynaud's.  She is not on anticoagulation.  She has been taking Tylenol for her pain.     HPI     Home Medications Prior to Admission medications   Medication Sig Start Date End Date Taking? Authorizing Provider  cephALEXin (KEFLEX) 500 MG capsule Take 1 capsule (500 mg total) by mouth 2 (two) times daily for 5 days. 03/25/23 03/30/23 Yes Larrissa Stivers, Kermit Balo, MD  ibuprofen (ADVIL) 600 MG tablet Take 1 tablet (600 mg total) by mouth every 8 (eight) hours as needed for mild pain (pain score 1-3) or moderate pain (pain score 4-6). 03/25/23  04/24/23 Yes Nicky Milhouse, Kermit Balo, MD  ondansetron (ZOFRAN-ODT) 4 MG disintegrating tablet Take 1 tablet (4 mg total) by mouth every 8 (eight) hours as needed for up to 12 doses. 03/25/23  Yes Kentley Blyden, Kermit Balo, MD  anastrozole (ARIMIDEX) 1 MG tablet Take 1 tablet (1 mg total) by mouth daily. Patient not taking: Reported on 03/19/2023 11/06/20   Serena Croissant, MD  Colchicine (MITIGARE) 0.6 MG CAPS Take 0.6 mg by mouth daily at 6 (six) AM. Patient not taking: Reported on 03/19/2023 05/31/21   Sande Rives, MD  DM-Phenylephrine-Acetaminophen [facility] DAYQUIL COLD & FLU PO) Take 1 Dose by mouth 3 (three) times daily as needed (cough/fever/cold symptoms). Patient not taking: Reported on 03/19/2023    [provider]  gabapentin (NEURONTIN) 400 MG capsule Take 400 mg by mouth 3 (three) times daily. 03/04/23   [provider]  ibuprofen (ADVIL) 600 MG tablet Take 600 mg by mouth every 6 (six) hours as needed. 02/17/23   [provider]  loratadine (CLARITIN) 10 MG tablet Take 1 tablet (10 mg total) by mouth daily. Patient not taking: Reported on 03/19/2023 04/06/21   Glade Lloyd, MD  magnesium oxide (MAG-OX) 400 MG tablet Take 1 tablet by mouth at bedtime. Patient not taking: Reported on 03/19/2023 02/17/23   [provider]  Multiple Vitamin (MULTIVITAMIN WITH MINERALS) TABS tablet  Take 1 tablet by mouth daily. Patient not taking: Reported on 03/19/2023    [provider]  MYRBETRIQ 25 MG TB24 tablet Take 25 mg by mouth daily. Patient not taking: Reported on 03/19/2023 03/12/21   [provider]  pantoprazole (PROTONIX) 40 MG tablet Take 1 tablet (40 mg total) by mouth daily. Patient not taking: Reported on 03/19/2023 04/07/21   Glade Lloyd, MD  polyethylene glycol (MIRALAX / GLYCOLAX) 17 g packet Take 17 g by mouth daily as needed. Patient taking differently: Take 17 g by mouth daily as needed for moderate constipation. 04/06/21   Glade Lloyd, MD  senna-docusate (SENOKOT-S) 8.6-50 MG tablet Take 1 tablet by mouth 2 (two) times daily. Patient not taking: Reported on 03/19/2023 04/06/21   Glade Lloyd, MD      Allergies    Patient has no known allergies.    Review of Systems   Review of Systems  Physical Exam Updated Vital Signs BP (!) 153/93 (BP Location: Right Arm)   Pulse 80   Temp 97.7 F (36.5 C) (Oral)   Resp 16   Ht 4\' 11"  (1.499 m)   Wt 43.5 kg   SpO2 100%   BMI 19.39 kg/m  Physical Exam Constitutional:      General: She is not in acute distress. HENT:     Head: Normocephalic and atraumatic.  Eyes:     Conjunctiva/sclera: Conjunctivae normal.     Pupils: Pupils are equal, round, and reactive to light.  Cardiovascular:     Rate and Rhythm: Normal rate and regular rhythm.     Pulses: Normal pulses.  Pulmonary:     Effort: Pulmonary effort is normal. No respiratory distress.  Abdominal:     General: There is no distension.     Tenderness: There is no abdominal tenderness.  Genitourinary:    Comments: P.o. exam was performed with nurse chaperone present.  No active bleeding or evidence of external vaginitis.  The patient does have a tender mass within the vagina consistent with her prior history Musculoskeletal:     Comments: Back pain is worse reproducible with sitting upright, no spinal midline tenderness  Skin:    General: Skin is warm and dry.     Comments: Bluish mildly purple discoloration and hue of the fingertips bilaterally  Neurological:     General: No focal deficit present.     Mental Status: She is alert and oriented to person, place, and time. Mental status is at baseline.  Psychiatric:        Mood and Affect: Mood normal.        Behavior: Behavior normal.     ED Results / Procedures / Treatments   Labs (all labs ordered are listed, but only abnormal results are displayed) Labs Reviewed  CBC WITH DIFFERENTIAL/PLATELET - Abnormal; Notable for the following components:       Result Value   WBC 12.0 (*)    Hemoglobin 11.0 (*)    HCT 34.7 (*)    MCV 78.3 (*)    MCH 24.8 (*)    RDW 18.4 (*)    Neutro Abs 8.2 (*)    Eosinophils Absolute 0.6 (*)    All other components within normal limits  URINALYSIS, ROUTINE W REFLEX MICROSCOPIC - Abnormal; Notable for the following components:   APPearance HAZY (*)    Hgb urine dipstick LARGE (*)    Protein, ur 100 (*)    Leukocytes,Ua SMALL (*)    Non Squamous Epithelial 0-5 (*)  All other components within normal limits  COMPREHENSIVE METABOLIC PANEL - Abnormal; Notable for the following components:   Chloride 114 (*)    CO2 17 (*)    Glucose, Bld 108 (*)    BUN 39 (*)    All other components within normal limits  BLOOD GAS, VENOUS - Abnormal; Notable for the following components:   pCO2, Ven 41 (*)    pO2, Ven 31 (*)    Bicarbonate 18.8 (*)    Acid-base deficit 7.7 (*)    All other components within normal limits  URINE CULTURE  LIPASE, BLOOD    EKG None  Radiology No results found.  Procedures Procedures    Medications Ordered in ED Medications  lidocaine (XYLOCAINE) 2 % jelly 1 Application (has no administration in time range)  prednisoLONE acetate (PRED FORTE) 1 % ophthalmic suspension 1 drop (has no administration in time range)  ondansetron (ZOFRAN) injection 4 mg (4 mg Intravenous Given 03/25/23 0154)  ketorolac (TORADOL) 30 MG/ML injection 30 mg (30 mg Intravenous Given 03/25/23 0258)  cephALEXin (KEFLEX) capsule 500 mg (500 mg Oral Given 03/25/23 0258)    ED Course/ Medical Decision Making/ A&P Clinical Course as of 03/25/23 0344  Wed Mar 25, 2023  0200 SPO2 50% is NOT an accurate reading, pleth is poor and not getting accurate finger ox reading [MT]    Clinical Course User Index [MT] Abrina Petz, Kermit Balo, MD                                 Medical Decision Making Amount and/or Complexity of Data Reviewed Labs: ordered.  Risk Prescription drug management.   This patient  presents to the ED with concern for multiple concerns, including nausea, vomiting, dysuria,disloration of the fingertips. This involves an extensive number of treatment options, and is a complaint that carries with it a high risk of complications and morbidity.    These may be separate issues.  Nausea and vomiting may be multifactorial related to medication side effect versus gastritis versus IBS versus constipation versus other.  Patient was easily tolerating fluids in the ED I do not see an indication for CT imaging at this time  We will check a urine again for potential urinary tract infection.   Finger discoloration mitigating multifactorial including Raynaud's syndrome.  Her pulses are intact radial, brisk.    She does not demonstrate evidence of recurring acute angle-closure glaucoma at this time.  Vision appears to be improved per the report.  She has completed her week course of PF QID except for today because she lost her bottle today.    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 From reviewing the chart, I did not see any emergent indication for foley catheter, unless this was an operative procedure for her glaucoma.  No UA at [facility] but UA here had large hgb, neg nitrites and leuks.  She has chronic hgb in her Ua's.  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  I ordered and personally interpreted labs.  The pertinent results include: Overall there are no emergent findings.  She does have a suggestion of potential infection to UA, although  it is difficult to tell.  Will send a urine culture   I ordered medication including IV Toradol for pain with  some improvement in pain afterwards.  Keflex for potential UTI.  Uro-Jet jelly for pelvic exam  I have reviewed the patients home medicines and have made adjustments as needed  Test Considered: no Indication for emergent CT imaging or pelvic ultrasound at this time   Dispostion:  After consideration of the diagnostic results and the patients response to treatment, I feel that the patent would benefit from outpatient follow-up with her PCP as well as her current OB/GYN.  I strongly suspect that her pelvic pain is related to irritation of this vaginal mass with Foley insertion during the hospital visit a week ago.         Final Clinical Impression(s) / ED Diagnoses Final diagnoses:  Dysuria  Subacute vaginitis    Rx / DC Orders ED Discharge Orders          Ordered    ibuprofen (ADVIL) 600 MG tablet  Every 8 hours PRN        03/25/23 0338    cephALEXin (KEFLEX) 500 MG capsule  2 times daily        03/25/23 0338    ondansetron (ZOFRAN-ODT) 4 MG disintegrating tablet  Every 8 hours PRN        03/25/23 0338              Terald Sleeper, MD 03/25/23 [phone]

## 2023-03-25 NOTE — Discharge Instructions (Addendum)
Please call your OBGYN provider tomorrow to discuss your pelvic pain.

## 2023-03-26 ENCOUNTER — Other Ambulatory Visit: Payer: Self-pay | Admitting: Student

## 2023-03-26 DIAGNOSIS — K625 Hemorrhage of anus and rectum: Secondary | ICD-10-CM

## 2023-03-26 DIAGNOSIS — R1310 Dysphagia, unspecified: Secondary | ICD-10-CM

## 2023-03-26 LAB — URINE CULTURE: Culture: 10000 — AB

## 2023-04-23 ENCOUNTER — Other Ambulatory Visit: Payer: Medicare HMO

## 2023-04-30 ENCOUNTER — Other Ambulatory Visit: Payer: Medicare HMO

## 2023-05-13 ENCOUNTER — Other Ambulatory Visit: Payer: Medicare HMO

## 2023-05-14 ENCOUNTER — Other Ambulatory Visit: Payer: Self-pay

## 2023-05-14 ENCOUNTER — Emergency Department (HOSPITAL_COMMUNITY): Payer: Medicare HMO

## 2023-05-14 ENCOUNTER — Other Ambulatory Visit: Payer: Self-pay | Admitting: Urology

## 2023-05-14 ENCOUNTER — Emergency Department (HOSPITAL_COMMUNITY)
Admission: EM | Admit: 2023-05-14 | Discharge: 2023-05-14 | Disposition: A | Discharge status: Home / Self Care | Payer: Medicare HMO | Attending: Emergency Medicine | Admitting: Emergency Medicine

## 2023-05-14 DIAGNOSIS — D72829 Elevated white blood cell count, unspecified: Secondary | ICD-10-CM | POA: Insufficient documentation

## 2023-05-14 DIAGNOSIS — J449 Chronic obstructive pulmonary disease, unspecified: Secondary | ICD-10-CM | POA: Insufficient documentation

## 2023-05-14 DIAGNOSIS — D649 Anemia, unspecified: Secondary | ICD-10-CM | POA: Insufficient documentation

## 2023-05-14 DIAGNOSIS — I10 Essential (primary) hypertension: Secondary | ICD-10-CM | POA: Insufficient documentation

## 2023-05-14 DIAGNOSIS — Y92009 Unspecified place in unspecified non-institutional (private) residence as the place of occurrence of the external cause: Secondary | ICD-10-CM | POA: Diagnosis not present

## 2023-05-14 DIAGNOSIS — Z853 Personal history of malignant neoplasm of breast: Secondary | ICD-10-CM | POA: Insufficient documentation

## 2023-05-14 DIAGNOSIS — R300 Dysuria: Secondary | ICD-10-CM | POA: Diagnosis present

## 2023-05-14 DIAGNOSIS — R55 Syncope and collapse: Secondary | ICD-10-CM | POA: Insufficient documentation

## 2023-05-14 DIAGNOSIS — N3090 Cystitis, unspecified without hematuria: Secondary | ICD-10-CM | POA: Diagnosis not present

## 2023-05-14 DIAGNOSIS — W19XXXA Unspecified fall, initial encounter: Secondary | ICD-10-CM

## 2023-05-14 DIAGNOSIS — W1830XA Fall on same level, unspecified, initial encounter: Secondary | ICD-10-CM | POA: Insufficient documentation

## 2023-05-14 LAB — URINALYSIS, W/ REFLEX TO CULTURE (INFECTION SUSPECTED)
Bacteria, UA: NONE SEEN
Bilirubin Urine: NEGATIVE
Glucose, UA: NEGATIVE mg/dL
Ketones, ur: NEGATIVE mg/dL
Nitrite: NEGATIVE
Protein, ur: 30 mg/dL — AB
RBC / HPF: >50 RBC/hpf (ref 0–5)
Specific Gravity, Urine: 1.016 (ref 1.005–1.030)
pH: 6 (ref 5.0–8.0)

## 2023-05-14 LAB — CBC WITH DIFFERENTIAL/PLATELET
Abs Immature Granulocytes: 0.05 10*3/uL (ref 0.00–0.07)
Basophils Absolute: 0.1 10*3/uL (ref 0.0–0.1)
Basophils Relative: 0 %
Eosinophils Absolute: 0.5 10*3/uL (ref 0.0–0.5)
Eosinophils Relative: 4 %
HCT: 31 % — ABNORMAL LOW (ref 36.0–46.0)
Hemoglobin: 9.9 g/dL — ABNORMAL LOW (ref 12.0–15.0)
Immature Granulocytes: 0 %
Lymphocytes Relative: 10 %
Lymphs Abs: 1.5 10*3/uL (ref 0.7–4.0)
MCH: 24.3 pg — ABNORMAL LOW (ref 26.0–34.0)
MCHC: 31.9 g/dL (ref 30.0–36.0)
MCV: 76.2 fL — ABNORMAL LOW (ref 80.0–100.0)
Monocytes Absolute: 0.7 10*3/uL (ref 0.1–1.0)
Monocytes Relative: 5 %
Neutro Abs: 11.8 10*3/uL — ABNORMAL HIGH (ref 1.7–7.7)
Neutrophils Relative %: 81 %
Platelets: 289 10*3/uL (ref 150–400)
RBC: 4.07 MIL/uL (ref 3.87–5.11)
RDW: 18.4 % — ABNORMAL HIGH (ref 11.5–15.5)
WBC: 14.6 10*3/uL — ABNORMAL HIGH (ref 4.0–10.5)
nRBC: 0 % (ref 0.0–0.2)

## 2023-05-14 LAB — COMPREHENSIVE METABOLIC PANEL
ALT: 12 U/L (ref 0–44)
AST: 16 U/L (ref 15–41)
Albumin: 3.2 g/dL — ABNORMAL LOW (ref 3.5–5.0)
Alkaline Phosphatase: 71 U/L (ref 38–126)
Anion gap: 10 (ref 5–15)
BUN: 34 mg/dL — ABNORMAL HIGH (ref 8–23)
CO2: 19 mmol/L — ABNORMAL LOW (ref 22–32)
Calcium: 9.3 mg/dL (ref 8.9–10.3)
Chloride: 113 mmol/L — ABNORMAL HIGH (ref 98–111)
Creatinine, Ser: 0.87 mg/dL (ref 0.44–1.00)
GFR, Estimated: >60 mL/min (ref >60)
Glucose, Bld: 101 mg/dL — ABNORMAL HIGH (ref 70–99)
Potassium: 3.2 mmol/L — ABNORMAL LOW (ref 3.5–5.1)
Sodium: 142 mmol/L (ref 135–145)
Total Bilirubin: 0.4 mg/dL (ref 0.0–1.2)
Total Protein: 7.2 g/dL (ref 6.5–8.1)

## 2023-05-14 MED ORDER — SODIUM CHLORIDE 0.9 % IV BOLUS
500.0000 mL | Freq: Once | INTRAVENOUS | Status: AC
  Administered 2023-05-14: 500 mL via INTRAVENOUS

## 2023-05-14 MED ORDER — ONDANSETRON 4 MG PO TBDP: 4.0000 mg | ORAL_TABLET | Freq: Three times a day (TID) | ORAL | 0 refills | Status: DC | PRN

## 2023-05-14 MED ORDER — CEFTRIAXONE SODIUM 1 G IJ SOLR
1.0000 g | Freq: Once | INTRAMUSCULAR | Status: AC
  Administered 2023-05-14: 1 g via INTRAVENOUS
  Filled 2023-05-14: qty 10

## 2023-05-14 MED ORDER — POTASSIUM CHLORIDE CRYS ER 20 MEQ PO TBCR
20.0000 meq | EXTENDED_RELEASE_TABLET | Freq: Once | ORAL | Status: AC
  Administered 2023-05-14: 20 meq via ORAL
  Filled 2023-05-14: qty 1

## 2023-05-14 MED ORDER — CEPHALEXIN 500 MG PO CAPS
500.0000 mg | ORAL_CAPSULE | Freq: Two times a day (BID) | ORAL | 0 refills | Status: DC
Start: 2023-05-14 — End: 2023-06-06

## 2023-05-14 NOTE — ED Provider Notes (Signed)
[place] EMERGENCY DEPARTMENT AT [facility] Provider Note   CSN: [id number] Arrival date & time: 05/14/23  0010     History  Chief Complaint  Patient presents with   [facility] Nicole Rush is a 83 y.o. female.  The history is provided by the patient, the EMS personnel and medical records.  Fall  [address] Nicole Rush is a 83 y.o. female who presents to the Emergency Department complaining of fall.  She presents to the emergency department by EMS following a fall episode that occurred earlier today.  She has been feeling unwell over the last few days and very anxious regarding an upcoming appointment for cataract surgery Thursday morning.  She states that she has been feeling dysuria and nausea for the last several days as well.  She was getting up to use the bathroom because she thought that she might vomit and when she was walking to the bathroom she became dizzy and fell to the ground.  She did strike her head.  She is unclear if she lost consciousness.  No associated chest pain, difficulty breathing, abdominal pain.  EMS was called to the residence twice today.  She is currently staying with her granddaughter.  EMS reports that she had a nosebleed earlier - resolved on their arrival. She does not have vision in her right eye currently secondary to cataracts.     Home Medications Prior to Admission medications   Medication Sig Start Date End Date Taking? Authorizing Provider  cephALEXin (KEFLEX) 500 MG capsule Take 1 capsule (500 mg total) by mouth 2 (two) times daily. 05/14/23  Yes Tilden Fossa, MD  ondansetron (ZOFRAN-ODT) 4 MG disintegrating tablet Take 1 tablet (4 mg total) by mouth every 8 (eight) hours as needed. 05/14/23  Yes Tilden Fossa, MD  anastrozole (ARIMIDEX) 1 MG tablet Take 1 tablet (1 mg total) by mouth daily. Patient not taking: Reported on 03/19/2023 11/06/20   Serena Croissant, MD  Colchicine (MITIGARE) 0.6 MG CAPS Take 0.6 mg by mouth daily at 6  (six) AM. Patient not taking: Reported on 03/19/2023 05/31/21   Sande Rives, MD  DM-Phenylephrine-Acetaminophen [facility] DAYQUIL COLD & FLU PO) Take 1 Dose by mouth 3 (three) times daily as needed (cough/fever/cold symptoms). Patient not taking: Reported on 03/19/2023    [provider]  gabapentin (NEURONTIN) 400 MG capsule Take 400 mg by mouth 3 (three) times daily. 03/04/23   [provider]  ibuprofen (ADVIL) 600 MG tablet Take 600 mg by mouth every 6 (six) hours as needed. 02/17/23   [provider]  loratadine (CLARITIN) 10 MG tablet Take 1 tablet (10 mg total) by mouth daily. Patient not taking: Reported on 03/19/2023 04/06/21   Glade Lloyd, MD  magnesium oxide (MAG-OX) 400 MG tablet Take 1 tablet by mouth at bedtime. Patient not taking: Reported on 03/19/2023 02/17/23   [provider]  Multiple Vitamin (MULTIVITAMIN WITH MINERALS) TABS tablet Take 1 tablet by mouth daily. Patient not taking: Reported on 03/19/2023    [provider]  MYRBETRIQ 25 MG TB24 tablet Take 25 mg by mouth daily. Patient not taking: Reported on 03/19/2023 03/12/21   [provider]  pantoprazole (PROTONIX) 40 MG tablet Take 1 tablet (40 mg total) by mouth daily. Patient not taking: Reported on 03/19/2023 04/07/21   Glade Lloyd, MD  polyethylene glycol (MIRALAX / GLYCOLAX) 17 g packet Take 17 g by mouth daily as needed. Patient taking differently: Take 17 g by mouth daily  as needed for moderate constipation. 04/06/21   Glade Lloyd, MD  senna-docusate (SENOKOT-S) 8.6-50 MG tablet Take 1 tablet by mouth 2 (two) times daily. Patient not taking: Reported on 03/19/2023 04/06/21   Glade Lloyd, MD      Allergies    Patient has no known allergies.    Review of Systems   Review of Systems  All other systems reviewed and are negative.   Physical Exam Updated Vital Signs BP (!) 153/92   Pulse (!) 29   Temp 98.4 F (36.9 C) (Oral)   Resp  (!) 24   Ht 5' (1.524 m)   Wt 44.5 kg   SpO2 98%   BMI 19.14 kg/m  Physical Exam Vitals and nursing note reviewed.  Constitutional:      Appearance: She is well-developed.  HENT:     Head: Normocephalic and atraumatic.     Comments: Cataract of the right eye Cardiovascular:     Rate and Rhythm: Normal rate and regular rhythm.     Heart sounds: No murmur heard. Pulmonary:     Effort: Pulmonary effort is normal. No respiratory distress.     Breath sounds: Normal breath sounds.  Abdominal:     Palpations: Abdomen is soft.     Tenderness: There is no abdominal tenderness. There is no guarding or rebound.  Genitourinary:    Comments: Brown stool in rectal vault Musculoskeletal:        General: No swelling or tenderness.     Comments: 2+ DP pulses bilaterally  Skin:    General: Skin is warm and dry.  Neurological:     Mental Status: She is alert and oriented to person, place, and time.     Comments: No asymmetry of facial movements.  Decreased vision in the right visual fields.  5 out of 5 strength in all 4 extremities with sensation to light touch intact in all 4 extremities.  Psychiatric:        Behavior: Behavior normal.     ED Results / Procedures / Treatments   Labs (all labs ordered are listed, but only abnormal results are displayed) Labs Reviewed  COMPREHENSIVE METABOLIC PANEL - Abnormal; Notable for the following components:      Result Value   Potassium 3.2 (*)    Chloride 113 (*)    CO2 19 (*)    Glucose, Bld 101 (*)    BUN 34 (*)    Albumin 3.2 (*)    All other components within normal limits  CBC WITH DIFFERENTIAL/PLATELET - Abnormal; Notable for the following components:   WBC 14.6 (*)    Hemoglobin 9.9 (*)    HCT 31.0 (*)    MCV 76.2 (*)    MCH 24.3 (*)    RDW 18.4 (*)    Neutro Abs 11.8 (*)    All other components within normal limits  URINALYSIS, W/ REFLEX TO CULTURE (INFECTION SUSPECTED) - Abnormal; Notable for the following components:    APPearance HAZY (*)    Hgb urine dipstick LARGE (*)    Protein, ur 30 (*)    Leukocytes,Ua LARGE (*)    All other components within normal limits  URINE CULTURE    EKG None  Radiology CT Head Wo Contrast Result Date: 05/14/2023 CLINICAL DATA:  Head trauma, minor (Age >= 65y) syncope/struck head. Fall EXAM: CT HEAD WITHOUT CONTRAST TECHNIQUE: Contiguous axial images were obtained from the base of the skull through the vertex without intravenous contrast. RADIATION DOSE REDUCTION: This exam  was performed according to the departmental dose-optimization program which includes automated exposure control, adjustment of the mA and/or kV according to patient size and/or use of iterative reconstruction technique. COMPARISON:  03/05/2023 FINDINGS: Brain: Diffuse cerebral atrophy. No acute intracranial abnormality. Specifically, no hemorrhage, hydrocephalus, mass lesion, acute infarction, or significant intracranial injury. Vascular: No hyperdense vessel or unexpected calcification. Skull: No acute calvarial abnormality. Sinuses/Orbits: No acute findings Other: None IMPRESSION: No acute intracranial abnormality. Electronically Signed   By: Charlett Nose M.D.   On: 05/14/2023 01:37   DG Chest Port 1 View Result Date: 05/14/2023 CLINICAL DATA:  Syncopal episode, fell at home. EXAM: PORTABLE CHEST 1 VIEW COMPARISON:  HRCT 06/17/2021 FINDINGS: The cardiac size normal. Stable mediastinum with aortic tortuosity and atherosclerosis. Both lungs are slightly emphysematous but clear. The visualized skeletal structures are unremarkable. Surgical clips in the left axilla and chest wall are again shown. No new osseous findings. IMPRESSION: No evidence of acute chest disease. Emphysema. Aortic atherosclerosis. Electronically Signed   By: Almira Bar M.D.   On: 05/14/2023 00:35    Procedures Procedures    Medications Ordered in ED Medications  sodium chloride 0.9 % bolus 500 mL (500 mLs Intravenous New Bag/Given  05/14/23 0154)  potassium chloride SA (KLOR-CON M) CR tablet 20 mEq (20 mEq Oral Given 05/14/23 0154)  cefTRIAXone (ROCEPHIN) 1 g in sodium chloride 0.9 % 100 mL IVPB (0 g Intravenous Stopped 05/14/23 0231)    ED Course/ Medical Decision Making/ A&P                                 Medical Decision Making Amount and/or Complexity of Data Reviewed Labs: ordered. Radiology: ordered.  Risk Prescription drug management.   Patient with history of pericarditis, hypertension, COPD, breast cancer in 2020 status post lumpectomy treated with antiestrogen oral therapy with end date in 2026 here for evaluation of near syncopal event in setting of having nausea, dysuria.  She also does have associated anxiety related to upcoming cataract surgery.  She did strike her head, unclear if she truly lost consciousness.  She is at her baseline at time of ED arrival.  UA does have leukocytes present, RBCs but no bacteria.  In setting of her symptoms would treat for possible cystitis.  Presentation is not consistent with sepsis.  No flank pain, abdominal pain doubt kidney stone.  CBC with mild leukocytosis as well as anemia.  No reported bleeding at home, brown stool in rectal vault.  Chest x-ray is clear.  She does not have any current respiratory symptoms.  Current clinical picture is not consistent with PE, cardiogenic syncope, CVA.  She was treated with fluids, antibiotics.  She is able to ambulate without difficulty.  Plan to discharge with treatment for cystitis with outpatient follow-up as well as return precautions.        Final Clinical Impression(s) / ED Diagnoses Final diagnoses:  Near syncope  Fall, initial encounter  Leukocytosis, unspecified type  Cystitis    Rx / DC Orders ED Discharge Orders          Ordered    cephALEXin (KEFLEX) 500 MG capsule  2 times daily        05/14/23 0244    ondansetron (ZOFRAN-ODT) 4 MG disintegrating tablet  Every 8 hours PRN        05/14/23 0244               Madilyn Hook,  Lanora Manis, MD 05/14/23 [phone]

## 2023-05-14 NOTE — ED Triage Notes (Signed)
BIB EMS - Fall at home, denies blood thinners.  Family reports patient's nose was bleeding and she was vomiting; EMS states neither were occurring upon arrival. Patient has been weak all day and reports UTI symptoms.

## 2023-05-14 NOTE — ED Notes (Signed)
Patient ambulated to bathroom. Denies dizziness.

## 2023-05-15 LAB — URINE CULTURE

## 2023-05-15 NOTE — Patient Instructions (Signed)
SURGICAL WAITING ROOM VISITATION  Patients having surgery or a procedure may have no more than 2 support people in the waiting area - these visitors may rotate.    Children under the age of 42 must have an adult with them who is not the patient.  Due to an increase in RSV and influenza rates and associated hospitalizations, children ages 29 and under may not visit patients in [facility] hospitals.  Visitors with respiratory illnesses are discouraged from visiting and should remain at home.  If the patient needs to stay at the hospital during part of their recovery, the visitor guidelines for inpatient rooms apply. Pre-op nurse will coordinate an appropriate time for 1 support person to accompany patient in pre-op.  This support person may not rotate.    Please refer to the [facility] website for the visitor guidelines for Inpatients (after your surgery is over and you are in a regular room).       Your procedure is scheduled on: 05/20/23   Report to [facility] Main Entrance    Report to admitting at 12 noon   Call this number if you have problems the morning of surgery [phone]   Do not eat food or drink liquids :After Midnight.      Oral Hygiene is also important to reduce your risk of infection.                                    Remember - BRUSH YOUR TEETH THE MORNING OF SURGERY WITH YOUR REGULAR TOOTHPASTE  DENTURES WILL BE REMOVED PRIOR TO SURGERY PLEASE DO NOT APPLY "Poly grip" OR ADHESIVES!!!   Stop all vitamins and herbal supplements 7 days before surgery.   Take these medicines the morning of surgery with A SIP OF WATER: Keflex, gabapentin  DO NOT TAKE ANY ORAL DIABETIC MEDICATIONS DAY OF YOUR SURGERY             You may not have any metal on your body including hair pins, jewelry, and body piercing             Do not wear make-up, lotions, powders, perfumes/cologne, or deodorant  Do not wear nail polish including gel and S&S, artificial/acrylic  nails, or any other type of covering on natural nails including finger and toenails. If you have artificial nails, gel coating, etc. that needs to be removed by a nail salon please have this removed prior to surgery or surgery may need to be canceled/ delayed if the surgeon/ anesthesia feels like they are unable to be safely monitored.   Do not shave  48 hours prior to surgery.    Do not bring valuables to the hospital. [facility] IS NOT             RESPONSIBLE   FOR VALUABLES.   Contacts, glasses, dentures or bridgework may not be worn into surgery.  DO NOT BRING YOUR HOME MEDICATIONS TO THE HOSPITAL. PHARMACY WILL DISPENSE MEDICATIONS LISTED ON YOUR MEDICATION LIST TO YOU DURING YOUR ADMISSION IN THE HOSPITAL!    Patients discharged on the day of surgery will not be allowed to drive home.  Someone NEEDS to stay with you for the first 24 hours after anesthesia.   Special Instructions: Bring a copy of your healthcare power of attorney and living will documents the day of surgery if you haven't scanned them before.  Please read over the following fact sheets you were given: IF YOU HAVE QUESTIONS ABOUT YOUR PRE-OP INSTRUCTIONS PLEASE CALL [phone] Rosey Bath   If you received a COVID test during your pre-op visit  it is requested that you wear a mask when out in public, stay away from anyone that may not be feeling well and notify your surgeon if you develop symptoms. If you test positive for Covid or have been in contact with anyone that has tested positive in the last 10 days please notify you surgeon.    [facility] - Preparing for Surgery Before surgery, you can play an important role.  Because skin is not sterile, your skin needs to be as free of germs as possible.  You can reduce the number of germs on your skin by washing with CHG (chlorahexidine gluconate) soap before surgery.  CHG is an antiseptic cleaner which kills germs and bonds with the skin to continue killing germs  even after washing. Please DO NOT use if you have an allergy to CHG or antibacterial soaps.  If your skin becomes reddened/irritated stop using the CHG and inform your nurse when you arrive at Short Stay. Do not shave (including legs and underarms) for at least 48 hours prior to the first CHG shower.  You may shave your face/neck.  Please follow these instructions carefully:  1.  Shower with CHG Soap the night before surgery and the  morning of surgery.  2.  If you choose to wash your hair, wash your hair first as usual with your normal  shampoo.  3.  After you shampoo, rinse your hair and body thoroughly to remove the shampoo.                             4.  Use CHG as you would any other liquid soap.  You can apply chg directly to the skin and wash.  Gently with a scrungie or clean washcloth.  5.  Apply the CHG Soap to your body ONLY FROM THE NECK DOWN.   Do   not use on face/ open                           Wound or open sores. Avoid contact with eyes, ears mouth and   genitals (private parts).                       Wash face,  Genitals (private parts) with your normal soap.             6.  Wash thoroughly, paying special attention to the area where your    surgery  will be performed.  7.  Thoroughly rinse your body with warm water from the neck down.  8.  DO NOT shower/wash with your normal soap after using and rinsing off the CHG Soap.                9.  Pat yourself dry with a clean towel.            10.  Wear clean pajamas.            11.  Place clean sheets on your bed the night of your first shower and do not  sleep with pets. Day of Surgery : Do not apply any lotions/deodorants the morning of surgery.  Please wear clean clothes to the hospital/surgery center.  FAILURE TO FOLLOW THESE INSTRUCTIONS MAY RESULT IN THE CANCELLATION OF YOUR SURGERY  PATIENT SIGNATURE_________________________________  NURSE  SIGNATURE__________________________________  ________________________________________________________________________

## 2023-05-15 NOTE — Progress Notes (Signed)
COVID Vaccine received:  []  No [x]  Yes Date of any COVID positive Test in last 69 days:no  PCP - Leilani Able MD Cardiologist -   Chest x-ray - 05/14/23 Epic EKG -  05/14/23 Epic Stress Test -  ECHO - 04/17/21 Epic Cardiac Cath -   Bowel Prep - [x]  No  []   Yes ______  Pacemaker / ICD device [x]  No []  Yes   Spinal Cord Stimulator:[x]  No []  Yes       History of Sleep Apnea? [x]  No []  Yes   CPAP used?- [x]  No []  Yes    Does the patient monitor blood sugar?          [x]  No []  Yes  []  N/A  Patient has: [x]  NO Hx DM   []  Pre-DM                 []  DM1  []   DM2 Does patient have a [employer] or Dexacom? []  No []  Yes   Fasting Blood Sugar Ranges-  Checks Blood Sugar _____ times a day  GLP1 agonist / usual dose - no GLP1 instructions:  SGLT-2 inhibitors / usual dose - no SGLT-2 instructions:   Blood Thinner / Instructions:no Aspirin Instructions:no  Comments:   Activity level: Patient is able  to climb a flight of stairs without difficulty; [x]  No CP  [x]  No SOB, but would leg weakness  Patient can not perform ADLs without assistance.   Anesthesia review: HTN, COPD, Stroke, Aortic atherosclerosis.  Patient denies shortness of breath, fever, cough and chest pain at PAT appointment.  Patient verbalized understanding and agreement to the Pre-Surgical Instructions that were given to them at this PAT appointment. Patient was also educated of the need to review these PAT instructions again prior to his/her surgery.I reviewed the appropriate phone numbers to call if they have any and questions or concerns.

## 2023-05-18 ENCOUNTER — Encounter (HOSPITAL_COMMUNITY)
Admission: RE | Admit: 2023-05-18 | Discharge: 2023-05-18 | Disposition: A | Discharge status: Home / Self Care | Payer: Medicare HMO | Source: Ambulatory Visit | Attending: Urology | Admitting: Urology

## 2023-05-18 ENCOUNTER — Encounter (HOSPITAL_COMMUNITY): Payer: Self-pay

## 2023-05-18 ENCOUNTER — Other Ambulatory Visit: Payer: Self-pay

## 2023-05-18 DIAGNOSIS — Z01812 Encounter for preprocedural laboratory examination: Secondary | ICD-10-CM | POA: Diagnosis present

## 2023-05-18 DIAGNOSIS — J439 Emphysema, unspecified: Secondary | ICD-10-CM | POA: Diagnosis not present

## 2023-05-18 DIAGNOSIS — Z87891 Personal history of nicotine dependence: Secondary | ICD-10-CM | POA: Diagnosis not present

## 2023-05-18 DIAGNOSIS — C679 Malignant neoplasm of bladder, unspecified: Secondary | ICD-10-CM | POA: Diagnosis not present

## 2023-05-18 DIAGNOSIS — Z8673 Personal history of transient ischemic attack (TIA), and cerebral infarction without residual deficits: Secondary | ICD-10-CM | POA: Diagnosis not present

## 2023-05-18 DIAGNOSIS — I10 Essential (primary) hypertension: Secondary | ICD-10-CM | POA: Diagnosis not present

## 2023-05-18 HISTORY — DX: Dyspnea, unspecified: R06.00

## 2023-05-18 HISTORY — DX: Other complications of anesthesia, initial encounter: T88.59XA

## 2023-05-18 NOTE — Progress Notes (Signed)
Unable to do pt's med rec because no Pharmacy tech on duty to do it. Pt. Unable to give urine specimen today for U/A that was ordered. Can collect day of surgery.

## 2023-05-19 NOTE — Anesthesia Preprocedure Evaluation (Signed)
Anesthesia Evaluation  Patient identified by MRN, date of birth, ID band Patient awake    Reviewed: Allergy & Precautions, NPO status , Patient's Chart, lab work & pertinent test results  Airway Mallampati: II  TM Distance: >3 FB Neck ROM: Full    Dental  (+) Dental Advisory Given   Pulmonary asthma , COPD, former smoker   breath sounds clear to auscultation       Cardiovascular hypertension, Pt. on medications  Rhythm:Regular Rate:Normal     Neuro/Psych CVA    GI/Hepatic Neg liver ROS,GERD  ,,  Endo/Other  negative endocrine ROS    Renal/GU Renal disease     Musculoskeletal   Abdominal   Peds  Hematology  (+) Blood dyscrasia, anemia   Anesthesia Other Findings   Reproductive/Obstetrics                             Anesthesia Physical Anesthesia Plan  ASA: 3  Anesthesia Plan: General   Post-op Pain Management: Tylenol PO (pre-op)*   Induction: Intravenous  PONV Risk Score and Plan: 3 and Dexamethasone, Ondansetron and Treatment may vary due to age or medical condition  Airway Management Planned: LMA and Oral ETT  Additional Equipment: None  Intra-op Plan:   Post-operative Plan: Extubation in OR  Informed Consent: I have reviewed the patients History and Physical, chart, labs and discussed the procedure including the risks, benefits and alternatives for the proposed anesthesia with the patient or authorized representative who has indicated his/her understanding and acceptance.     Dental advisory given  Plan Discussed with: CRNA  Anesthesia Plan Comments: ( )        Anesthesia Quick Evaluation

## 2023-05-19 NOTE — Progress Notes (Addendum)
Case: [id number] Date/Time: 05/20/23 1331   Procedure: TRANSURETHRAL RESECTION OF BLADDER TUMOR (TURBT) with GEMCITABINE - 60 MINUTE CASE   Anesthesia type: General   Pre-op diagnosis: BLADDER CANCER   Location: [facility] PROCEDURE ROOM / [facility]   Surgeons: Rene Paci, MD       DISCUSSION: Nicole Rush is an 83 yo female who presents to PAT prior to surgery above. 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  Of note, patient is also scheduled for a posterior vitrectomy with Dr. Rich Reining at [facility] on 1/29. She was diagnosed with acute angle closure glaucoma and underwent LPI of the right eye on 03/19/23 at [facility]. She subsequently underwent right eye cataract extraction on 05/06/23. Case complicated by approximately 10 clock hours of zonulopathy with posterior loss of approximately 50% of the lens. Anterior vitrectomy performed. Now scheduled for posterior vitrectomy. [facility] scheduler notified. Addendum: eye surgery has been canceled  Patient previously followed with Pulmonology for asthma and COPD as well as abnormal lung CT. Last seen in 2023. It was noted her COPD is mild and didn't need inhalers. Advised to f/u in 6 months but has been lost to follow up.  Patient also previously saw Cardiology in 2023 after she developed acute pericarditis which was complicated by a pericardial effusion and tamponade. She underwent a pericardial window. She completed course of colchicine. She was advised to f/u in 6 months but has been lost to follow up.  VS: BP 132/69   Pulse 100   Temp 37.2 C (Oral)   Resp 18   Ht 5' (1.524 m)   Wt 42.6 kg   SpO2 97%   BMI 18.36 kg/m   PROVIDERS: Leilani Able, MD   LABS: Labs reviewed: Acceptable for surgery. (all labs ordered are listed, but only abnormal  results are displayed)  Labs Reviewed  URINE CULTURE     IMAGES:  CXR 05/14/2023:  IMPRESSION: No evidence of acute chest disease. Emphysema. Aortic atherosclerosis.  EKG 05/14/23  NSR, rate 87  CV:  Echo 04/17/2021:  IMPRESSIONS    1. Left ventricular ejection fraction, by estimation, is 65 to 70%. The left ventricle has normal function. The left ventricle has no regional wall motion abnormalities. Left ventricular diastolic parameters are consistent with Grade I diastolic dysfunction (impaired relaxation). The average left ventricular global longitudinal strain is -16.5 %.  2. Right ventricular systolic function is normal. The right ventricular size is normal. There is moderately elevated pulmonary artery systolic pressure.  3. The mitral valve is normal in structure. No evidence of mitral valve regurgitation. No evidence of mitral stenosis.  4. The aortic valve is tricuspid. There is mild calcification of the aortic valve. There is mild thickening of the aortic valve. Aortic valve regurgitation is not visualized. No aortic stenosis is present.  5. The inferior vena cava is dilated in size with <50% respiratory variability, suggesting right atrial pressure of 15 mmHg.  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   Complication of anesthesia    Dyspnea    Hypertension    Stroke [facility])    per pt no defecits    Past Surgical History:  Procedure Laterality Date   BREAST LUMPECTOMY WITH AXILLARY LYMPH NODE BIOPSY Left 04/06/2019  Procedure: LEFT BREAST LUMPECTOMY WITH LEFT SENTINEL LYMPH NODE BIOPSY;  Surgeon: Almond Lint, MD;  Location: [facility];  Service: General;  Laterality: Left;   BREAST SURGERY     RE-EXCISION OF BREAST LUMPECTOMY Left 05/04/2019   Procedure: RE-EXCISION OF LEFT BREAST LUMPECTOMY;  Surgeon: Almond Lint, MD;  Location: [facility];  Service: General;   Laterality: Left;    MEDICATIONS:  anastrozole (ARIMIDEX) 1 MG tablet   cephALEXin (KEFLEX) 500 MG capsule   Colchicine (MITIGARE) 0.6 MG CAPS   DM-Phenylephrine-Acetaminophen (VICKS DAYQUIL COLD & FLU PO)   gabapentin (NEURONTIN) 400 MG capsule   ibuprofen (ADVIL) 600 MG tablet   loratadine (CLARITIN) 10 MG tablet   magnesium oxide (MAG-OX) 400 MG tablet   Multiple Vitamin (MULTIVITAMIN WITH MINERALS) TABS tablet   MYRBETRIQ 25 MG TB24 tablet   ondansetron (ZOFRAN-ODT) 4 MG disintegrating tablet   pantoprazole (PROTONIX) 40 MG tablet   polyethylene glycol (MIRALAX / GLYCOLAX) 17 g packet   senna-docusate (SENOKOT-S) 8.6-50 MG tablet   No current facility-administered medications for this encounter.   [name] [facility] Short Stay/Anesthesiology [facility] Phone [phone] 05/19/2023 9:52 AM

## 2023-05-20 ENCOUNTER — Ambulatory Visit (HOSPITAL_BASED_OUTPATIENT_CLINIC_OR_DEPARTMENT_OTHER): Payer: Self-pay

## 2023-05-20 ENCOUNTER — Encounter (HOSPITAL_COMMUNITY): Payer: Self-pay | Admitting: Urology

## 2023-05-20 ENCOUNTER — Other Ambulatory Visit: Payer: Self-pay

## 2023-05-20 ENCOUNTER — Ambulatory Visit (HOSPITAL_COMMUNITY): Payer: Self-pay | Admitting: Medical

## 2023-05-20 ENCOUNTER — Ambulatory Visit (HOSPITAL_COMMUNITY)
Admission: RE | Admit: 2023-05-20 | Discharge: 2023-05-20 | Disposition: A | Discharge status: Home / Self Care | Payer: Medicare HMO | Source: Ambulatory Visit | Attending: Urology | Admitting: Urology

## 2023-05-20 ENCOUNTER — Encounter (HOSPITAL_COMMUNITY)
Admission: RE | Disposition: A | Discharge status: Home / Self Care | Payer: Self-pay | Source: Ambulatory Visit | Attending: Urology

## 2023-05-20 DIAGNOSIS — I10 Essential (primary) hypertension: Secondary | ICD-10-CM | POA: Insufficient documentation

## 2023-05-20 DIAGNOSIS — Z8673 Personal history of transient ischemic attack (TIA), and cerebral infarction without residual deficits: Secondary | ICD-10-CM | POA: Insufficient documentation

## 2023-05-20 DIAGNOSIS — C671 Malignant neoplasm of dome of bladder: Secondary | ICD-10-CM | POA: Insufficient documentation

## 2023-05-20 DIAGNOSIS — Z79899 Other long term (current) drug therapy: Secondary | ICD-10-CM | POA: Insufficient documentation

## 2023-05-20 DIAGNOSIS — J449 Chronic obstructive pulmonary disease, unspecified: Secondary | ICD-10-CM | POA: Diagnosis not present

## 2023-05-20 DIAGNOSIS — Z87891 Personal history of nicotine dependence: Secondary | ICD-10-CM | POA: Insufficient documentation

## 2023-05-20 DIAGNOSIS — D494 Neoplasm of unspecified behavior of bladder: Secondary | ICD-10-CM | POA: Diagnosis present

## 2023-05-20 DIAGNOSIS — C679 Malignant neoplasm of bladder, unspecified: Secondary | ICD-10-CM

## 2023-05-20 DIAGNOSIS — J4489 Other specified chronic obstructive pulmonary disease: Secondary | ICD-10-CM | POA: Insufficient documentation

## 2023-05-20 DIAGNOSIS — I639 Cerebral infarction, unspecified: Secondary | ICD-10-CM | POA: Diagnosis not present

## 2023-05-20 SURGERY — TURBT, WITH CHEMOTHERAPEUTIC AGENT INSTILLATION INTO BLADDER
Anesthesia: General

## 2023-05-20 MED ORDER — ACETAMINOPHEN 325 MG PO TABS
650.0000 mg | ORAL_TABLET | Freq: Once | ORAL | Status: AC
  Administered 2023-05-20: 650 mg via ORAL
  Filled 2023-05-20: qty 2

## 2023-05-20 MED ORDER — ORAL CARE MOUTH RINSE: 15.0000 mL | Freq: Once | OROMUCOSAL | Status: AC

## 2023-05-20 MED ORDER — ESMOLOL HCL 100 MG/10ML IV SOLN
INTRAVENOUS | Status: AC
  Filled 2023-05-20: qty 10

## 2023-05-20 MED ORDER — PROPOFOL 10 MG/ML IV BOLUS
INTRAVENOUS | Status: AC
  Filled 2023-05-20: qty 20

## 2023-05-20 MED ORDER — CEFAZOLIN SODIUM-DEXTROSE 2-4 GM/100ML-% IV SOLN
2.0000 g | INTRAVENOUS | Status: AC
  Administered 2023-05-20: 2 g via INTRAVENOUS
  Filled 2023-05-20: qty 100

## 2023-05-20 MED ORDER — FENTANYL CITRATE PF 50 MCG/ML IJ SOSY
PREFILLED_SYRINGE | INTRAMUSCULAR | Status: AC
  Administered 2023-05-20: 50 ug via INTRAVENOUS
  Filled 2023-05-20: qty 3

## 2023-05-20 MED ORDER — OXYBUTYNIN CHLORIDE 5 MG PO TABS
10.0000 mg | ORAL_TABLET | Freq: Once | ORAL | Status: AC
  Administered 2023-05-20: 10 mg via ORAL

## 2023-05-20 MED ORDER — ESMOLOL HCL 100 MG/10ML IV SOLN
INTRAVENOUS | Status: DC | PRN
  Administered 2023-05-20: 10 mg via INTRAVENOUS

## 2023-05-20 MED ORDER — FENTANYL CITRATE PF 50 MCG/ML IJ SOSY
25.0000 ug | PREFILLED_SYRINGE | INTRAMUSCULAR | Status: DC | PRN
  Administered 2023-05-20 (×2): 50 ug via INTRAVENOUS

## 2023-05-20 MED ORDER — DEXAMETHASONE SODIUM PHOSPHATE 10 MG/ML IJ SOLN
INTRAMUSCULAR | Status: DC | PRN
  Administered 2023-05-20: 5 mg via INTRAVENOUS

## 2023-05-20 MED ORDER — LIDOCAINE HCL (CARDIAC) PF 100 MG/5ML IV SOSY
PREFILLED_SYRINGE | INTRAVENOUS | Status: DC | PRN
  Administered 2023-05-20: 40 mg via INTRAVENOUS

## 2023-05-20 MED ORDER — SODIUM CHLORIDE 0.9 % IR SOLN
Status: DC | PRN
  Administered 2023-05-20 (×4): 1000 mL

## 2023-05-20 MED ORDER — AMISULPRIDE (ANTIEMETIC) 5 MG/2ML IV SOLN: 5.0000 mg | Freq: Once | INTRAVENOUS | Status: AC | PRN

## 2023-05-20 MED ORDER — DEXAMETHASONE SODIUM PHOSPHATE 10 MG/ML IJ SOLN
INTRAMUSCULAR | Status: AC
  Filled 2023-05-20: qty 1

## 2023-05-20 MED ORDER — LACTATED RINGERS IV SOLN: INTRAVENOUS | Status: DC

## 2023-05-20 MED ORDER — ONDANSETRON HCL 4 MG/2ML IJ SOLN
INTRAMUSCULAR | Status: DC | PRN
  Administered 2023-05-20: 4 mg via INTRAVENOUS

## 2023-05-20 MED ORDER — TRAMADOL HCL 50 MG PO TABS: 50.0000 mg | ORAL_TABLET | Freq: Four times a day (QID) | ORAL | 0 refills | Status: AC | PRN

## 2023-05-20 MED ORDER — AMISULPRIDE (ANTIEMETIC) 5 MG/2ML IV SOLN
INTRAVENOUS | Status: AC
  Administered 2023-05-20: 5 mg via INTRAVENOUS
  Filled 2023-05-20: qty 4

## 2023-05-20 MED ORDER — SUGAMMADEX SODIUM 200 MG/2ML IV SOLN
INTRAVENOUS | Status: DC | PRN
  Administered 2023-05-20: 180 mg via INTRAVENOUS

## 2023-05-20 MED ORDER — GEMCITABINE CHEMO FOR BLADDER INSTILLATION 2000 MG
2000.0000 mg | Freq: Once | INTRAVENOUS | Status: AC
  Administered 2023-05-20: 2000 mg via INTRAVESICAL
  Filled 2023-05-20: qty 2000

## 2023-05-20 MED ORDER — ONDANSETRON HCL 4 MG/2ML IJ SOLN
INTRAMUSCULAR | Status: AC
  Filled 2023-05-20: qty 2

## 2023-05-20 MED ORDER — CHLORHEXIDINE GLUCONATE 0.12 % MT SOLN
15.0000 mL | Freq: Once | OROMUCOSAL | Status: AC
Start: 2023-05-20 — End: 2023-05-20
  Administered 2023-05-20: 15 mL via OROMUCOSAL

## 2023-05-20 MED ORDER — PROPOFOL 10 MG/ML IV BOLUS
INTRAVENOUS | Status: DC | PRN
  Administered 2023-05-20: 80 mg via INTRAVENOUS
  Administered 2023-05-20: 30 mg via INTRAVENOUS

## 2023-05-20 MED ORDER — ROCURONIUM BROMIDE 100 MG/10ML IV SOLN
INTRAVENOUS | Status: DC | PRN
  Administered 2023-05-20: 30 mg via INTRAVENOUS
  Administered 2023-05-20: 25 mg via INTRAVENOUS

## 2023-05-20 MED ORDER — FENTANYL CITRATE (PF) 100 MCG/2ML IJ SOLN
INTRAMUSCULAR | Status: DC | PRN
  Administered 2023-05-20 (×3): 25 ug via INTRAVENOUS
  Administered 2023-05-20 (×2): 50 ug via INTRAVENOUS
  Administered 2023-05-20: 25 ug via INTRAVENOUS

## 2023-05-20 MED ORDER — OXYBUTYNIN CHLORIDE 5 MG PO TABS
ORAL_TABLET | ORAL | Status: AC
  Filled 2023-05-20: qty 2

## 2023-05-20 MED ORDER — OXYBUTYNIN CHLORIDE 5 MG PO TABS: 5.0000 mg | ORAL_TABLET | Freq: Three times a day (TID) | ORAL | 1 refills | Status: DC | PRN

## 2023-05-20 MED ORDER — PHENYLEPHRINE 80 MCG/ML (10ML) SYRINGE FOR IV PUSH (FOR BLOOD PRESSURE SUPPORT)
PREFILLED_SYRINGE | INTRAVENOUS | Status: AC
  Filled 2023-05-20: qty 10

## 2023-05-20 MED ORDER — FENTANYL CITRATE PF 50 MCG/ML IJ SOSY
25.0000 ug | PREFILLED_SYRINGE | INTRAMUSCULAR | Status: DC | PRN
Start: 2023-05-20 — End: 2023-05-20
  Administered 2023-05-20: 50 ug via INTRAVENOUS

## 2023-05-20 MED ORDER — FENTANYL CITRATE (PF) 100 MCG/2ML IJ SOLN
INTRAMUSCULAR | Status: AC
  Filled 2023-05-20: qty 2

## 2023-05-20 MED ORDER — ROCURONIUM BROMIDE 10 MG/ML (PF) SYRINGE
PREFILLED_SYRINGE | INTRAVENOUS | Status: AC
  Filled 2023-05-20: qty 10

## 2023-05-20 MED ORDER — LIDOCAINE HCL (PF) 2 % IJ SOLN
INTRAMUSCULAR | Status: AC
  Filled 2023-05-20: qty 5

## 2023-05-20 MED ORDER — PHENYLEPHRINE 80 MCG/ML (10ML) SYRINGE FOR IV PUSH (FOR BLOOD PRESSURE SUPPORT)
PREFILLED_SYRINGE | INTRAVENOUS | Status: DC | PRN
  Administered 2023-05-20: 80 ug via INTRAVENOUS

## 2023-05-20 MED ORDER — PHENAZOPYRIDINE HCL 200 MG PO TABS: 200.0000 mg | ORAL_TABLET | Freq: Three times a day (TID) | ORAL | 0 refills | Status: DC | PRN

## 2023-05-20 SURGICAL SUPPLY — 15 items
BAG URINE DRAIN 2000ML AR STRL (UROLOGICAL SUPPLIES) IMPLANT
BAG URO CATCHER STRL LF (MISCELLANEOUS) ×1 IMPLANT
CATH FOLEY 2WAY SLVR 5CC 18FR (CATHETERS) IMPLANT
DRAPE FOOT SWITCH (DRAPES) ×1 IMPLANT
ELECT REM PT RETURN 15FT ADLT (MISCELLANEOUS) ×1 IMPLANT
GLOVE SURG LX STRL 7.5 STRW (GLOVE) ×1 IMPLANT
GOWN STRL SURGICAL XL XLNG (GOWN DISPOSABLE) ×1 IMPLANT
KIT TURNOVER KIT A (KITS) IMPLANT
LOOP CUT BIPOLAR 24F LRG (ELECTROSURGICAL) IMPLANT
MANIFOLD NEPTUNE II (INSTRUMENTS) ×1 IMPLANT
PACK CYSTO (CUSTOM PROCEDURE TRAY) ×1 IMPLANT
SYR TOOMEY IRRIG 70ML (MISCELLANEOUS) ×1
SYRINGE TOOMEY IRRIG 70ML (MISCELLANEOUS) IMPLANT
TUBING CONNECTING 10 (TUBING) ×1 IMPLANT
TUBING UROLOGY SET (TUBING) ×1 IMPLANT

## 2023-05-20 NOTE — Transfer of Care (Signed)
Immediate Anesthesia Transfer of Care Note  Patient: Nicole Rush  Procedure(s) Performed: TRANSURETHRAL RESECTION OF BLADDER TUMOR (TURBT) with GEMCITABINE  Patient Location: PACU  Anesthesia Type:General  Level of Consciousness: awake  Airway & Oxygen Therapy: Patient Spontanous Breathing  Post-op Assessment: Report given to RN and Post -op Vital signs reviewed and stable  Post vital signs: Reviewed and stable  Last Vitals:  Vitals Value Taken Time  BP 171/118 05/20/23 1412  Temp    Pulse 96 05/20/23 1408  Resp 15 05/20/23 1415  SpO2 96 % 05/20/23 1408  Vitals shown include unfiled device data.  Last Pain:  Vitals:   05/20/23 1408  TempSrc:   PainSc: 10-Worst pain ever         Complications: No notable events documented.

## 2023-05-20 NOTE — Op Note (Signed)
Operative Note  Preoperative diagnosis:  1.  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  Cystoscopy with TURBT medium 2.  Intravesical instillation of gemcitabine  Surgeon: Rhoderick Moody, MD  Assistants:  None  Anesthesia:  General  Complications:  None  EBL: 10 mL  Specimens: 1.  Bladder dome tumor  Drains/Catheters: 1.  18 French Foley catheter with 10 mL of sterile water in the balloon (to be removed 1 hour after gemcitabine instillation)  Intraoperative findings:   Superficial appearing papillary bladder tumor measuring approximately 2.5 cm involving the bladder dome No evidence of bladder perforation following bladder tumor resection No other intravesical abnormalities were seen  Indication:  Nicole Rush is a 83 y.o. 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  She has been consented for the above procedures, voices understanding and wishes to proceed  Description of procedure:  The patient was taken to the operating room and administered general anesthesia. They were then placed on the table and moved to the dorsal lithotomy position after which the genitalia was sterilely prepped and draped. An official timeout was then performed.  The 35 French resectoscope with the 30 lens and visual obturator were then passed into the bladder under direct visualization. Urethra appeared normal. The visual obturator was then removed and the Gyrus resectoscope element with 30  lens was then inserted and the bladder was fully and systematically inspected. Ureteral orifices were noted to be in the normal anatomic positions.   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  My initial resection was already down to the detrusor musculature, therefore, I did not proceed with a deeper resection margin out of concern for bladder  perforation.  Reinspection of the bladder revealed all obvious tumor had been fully resected and there was no evidence of perforation. The Microvasive evacuator was then used to irrigate the bladder and remove all of the portions of bladder tumor which were sent to pathology. I then removed the resectoscope.  An 85 French Foley catheter was then inserted in the bladder and irrigated. The irrigant returned slightly pink with no clots. The patient was awakened and taken to the recovery room in stable condition.  While in the recovery room 2000 mg of gemcitabine in 50 mL of water was instilled in the bladder through the catheter and the catheter was plugged. This will remain indwelling for approximately one hour. It will then be drained from the bladder and the catheter will be removed and the patient discharged home.  Plan: Drain bladder and remove Foley catheter 1 hour after gemcitabine instillation.

## 2023-05-20 NOTE — Anesthesia Postprocedure Evaluation (Signed)
Anesthesia Post Note  Patient: Tyson Babinski Tench  Procedure(s) Performed: TRANSURETHRAL RESECTION OF BLADDER TUMOR (TURBT) with GEMCITABINE     Patient location during evaluation: PACU Anesthesia Type: General Level of consciousness: awake and alert Pain management: pain level controlled Vital Signs Assessment: post-procedure vital signs reviewed and stable Respiratory status: spontaneous breathing, nonlabored ventilation, respiratory function stable and patient connected to nasal cannula oxygen Cardiovascular status: blood pressure returned to baseline and stable Postop Assessment: no apparent nausea or vomiting Anesthetic complications: no  No notable events documented.  Last Vitals:  Vitals:   05/20/23 1621 05/20/23 1630  BP: (!) 180/95 (!) 164/99  Pulse: (!) 104 (!) 102  Resp: 18 20  Temp: 37.1 C   SpO2: 96% 97%    Last Pain:  Vitals:   05/20/23 1630  TempSrc:   PainSc: 3                  Kennieth Rad

## 2023-05-20 NOTE — Anesthesia Procedure Notes (Signed)
Procedure Name: Intubation Date/Time: 05/20/2023 1:10 PM  Performed by: Sampson Goon, CRNAPre-anesthesia Checklist: Patient identified, Emergency Drugs available, Suction available and Patient being monitored Patient Re-evaluated:Patient Re-evaluated prior to induction Oxygen Delivery Method: Circle System Utilized Preoxygenation: Pre-oxygenation with 100% oxygen Induction Type: IV induction Ventilation: Mask ventilation without difficulty and Oral airway inserted - appropriate to patient size Laryngoscope Size: Mac and 3 Grade View: Grade II Tube type: Oral Tube size: 7.0 mm Number of attempts: 1 Airway Equipment and Method: Stylet and Oral airway Placement Confirmation: ETT inserted through vocal cords under direct vision, positive ETCO2 and breath sounds checked- equal and bilateral Secured at: 20 cm Tube secured with: Tape Dental Injury: Teeth and Oropharynx as per pre-operative assessment

## 2023-05-20 NOTE — H&P (Signed)
Urology Preoperative H&P   Chief Complaint: Bladder tumor  History of Present Illness: Nicole Rush is a 83 y.o. female who was found to have a solid bladder lesion on recent pelvic US. She also reports an episode of gross hematuria last week. She previously smoked 1 ppd for 20 years, but quit when she was 48. No personal/family history of GU surgery/trauma or GU malignancies.   She underwent cystoscopy at her visit on 03/27/23 which revealed 2 papillary bladder tumors each measuring approximately 2.5 cm each involving the posterior bladder wall and bladder dome.   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   Complication of anesthesia    Dyspnea    Hypertension    Stroke (HCC)    per pt no defecits    Past Surgical History:  Procedure Laterality Date   BREAST LUMPECTOMY WITH AXILLARY LYMPH NODE BIOPSY Left 04/06/2019   Procedure: LEFT BREAST LUMPECTOMY WITH LEFT SENTINEL LYMPH NODE BIOPSY;  Surgeon: Almond Lint, MD;  Location: [facility];  Service: General;  Laterality: Left;   BREAST SURGERY     RE-EXCISION OF BREAST LUMPECTOMY Left 05/04/2019   Procedure: RE-EXCISION OF LEFT BREAST LUMPECTOMY;  Surgeon: Almond Lint, MD;  Location: [facility];  Service: General;  Laterality: Left;    Allergies: No Known Allergies  Family History  Problem Relation Age of Onset   Breast cancer Mother    Lung cancer Father     Social History:  reports that she has quit smoking. She has been exposed to tobacco smoke. She has never used smokeless tobacco. She reports that she does not drink alcohol and does not use drugs.  ROS: A complete review of systems was performed.  All systems are negative except for pertinent findings as noted.  Physical Exam:  Vital signs in last 24 hours:   Constitutional:  Alert and oriented, No acute distress Cardiovascular: Regular rate and rhythm, No JVD Respiratory: Normal  respiratory effort, Lungs clear bilaterally GI: Abdomen is soft, nontender, nondistended, no abdominal masses GU: No CVA tenderness Lymphatic: No lymphadenopathy Neurologic: Grossly intact, no focal deficits Psychiatric: Normal mood and affect  Laboratory Data:  No results for input(s): "WBC", "HGB", "HCT", "PLT" in the last 72 hours.  No results for input(s): "NA", "K", "CL", "GLUCOSE", "BUN", "CALCIUM", "CREATININE" in the last 72 hours.  Invalid input(s): "CO3"   No results found for this or any previous visit (from the past 24 hours). Recent Results (from the past 240 hours)  Urine Culture     Status: Abnormal   Collection Time: 05/14/23  1:12 AM   Specimen: Urine, Random  Result Value Ref Range Status   Specimen Description URINE, RANDOM  Final   Special Requests   Final    NONE Reflexed from [id number] Performed at [facility], [address], [place], [place] [address]    Culture MULTIPLE SPECIES PRESENT, SUGGEST RECOLLECTION (A)  Final   Report Status 05/15/2023 FINAL  Final    Renal Function: Recent Labs    05/14/23 0049  CREATININE 0.87   Estimated Creatinine Clearance: 32.4 mL/min (by C-G formula based on SCr of 0.87 mg/dL).  Radiologic Imaging: No results found.  I independently reviewed the above imaging studies.  Assessment and Plan Nicole Rush is a 83 y.o. 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 The risks include, but are not limited to, bleeding, urinary tract infection, bladder perforation requiring prolonged catheterization and/or open bladder repair, ureteral obstruction, voiding dysfunction and the inherent risks of general anesthesia. The patient voices understanding and wishes to proceed.    Rhoderick Moody, MD 05/20/2023, 7:14 AM  [facility] Pager: [phone]

## 2023-05-21 LAB — URINE CULTURE: Culture: NO GROWTH

## 2023-05-21 LAB — SURGICAL PATHOLOGY

## 2023-05-28 ENCOUNTER — Inpatient Hospital Stay: Admission: RE | Admit: 2023-05-28 | Payer: Medicare HMO | Source: Ambulatory Visit

## 2023-06-02 ENCOUNTER — Encounter: Payer: Self-pay | Admitting: Student

## 2023-06-06 ENCOUNTER — Inpatient Hospital Stay (HOSPITAL_COMMUNITY): Payer: Medicare HMO

## 2023-06-06 ENCOUNTER — Other Ambulatory Visit: Payer: Self-pay

## 2023-06-06 ENCOUNTER — Encounter (HOSPITAL_COMMUNITY): Payer: Self-pay | Admitting: Emergency Medicine

## 2023-06-06 ENCOUNTER — Emergency Department (HOSPITAL_COMMUNITY): Payer: Medicare HMO

## 2023-06-06 ENCOUNTER — Inpatient Hospital Stay (HOSPITAL_COMMUNITY)
Admission: EM | Admit: 2023-06-06 | Discharge: 2023-06-08 | DRG: 175 | Disposition: A | Discharge status: Hospice | Payer: Medicare HMO | Attending: Internal Medicine | Admitting: Internal Medicine

## 2023-06-06 DIAGNOSIS — Z86711 Personal history of pulmonary embolism: Secondary | ICD-10-CM | POA: Diagnosis not present

## 2023-06-06 DIAGNOSIS — J4489 Other specified chronic obstructive pulmonary disease: Secondary | ICD-10-CM | POA: Diagnosis present

## 2023-06-06 DIAGNOSIS — R0602 Shortness of breath: Secondary | ICD-10-CM | POA: Diagnosis present

## 2023-06-06 DIAGNOSIS — I2693 Single subsegmental pulmonary embolism without acute cor pulmonale: Secondary | ICD-10-CM

## 2023-06-06 DIAGNOSIS — D509 Iron deficiency anemia, unspecified: Secondary | ICD-10-CM | POA: Diagnosis present

## 2023-06-06 DIAGNOSIS — Z9981 Dependence on supplemental oxygen: Secondary | ICD-10-CM

## 2023-06-06 DIAGNOSIS — I10 Essential (primary) hypertension: Secondary | ICD-10-CM | POA: Diagnosis present

## 2023-06-06 DIAGNOSIS — K219 Gastro-esophageal reflux disease without esophagitis: Secondary | ICD-10-CM | POA: Diagnosis present

## 2023-06-06 DIAGNOSIS — Z803 Family history of malignant neoplasm of breast: Secondary | ICD-10-CM | POA: Diagnosis not present

## 2023-06-06 DIAGNOSIS — N3000 Acute cystitis without hematuria: Secondary | ICD-10-CM | POA: Diagnosis present

## 2023-06-06 DIAGNOSIS — J439 Emphysema, unspecified: Secondary | ICD-10-CM | POA: Diagnosis present

## 2023-06-06 DIAGNOSIS — J9601 Acute respiratory failure with hypoxia: Secondary | ICD-10-CM | POA: Diagnosis present

## 2023-06-06 DIAGNOSIS — Z8673 Personal history of transient ischemic attack (TIA), and cerebral infarction without residual deficits: Secondary | ICD-10-CM | POA: Diagnosis not present

## 2023-06-06 DIAGNOSIS — R54 Age-related physical debility: Secondary | ICD-10-CM | POA: Diagnosis present

## 2023-06-06 DIAGNOSIS — Z79899 Other long term (current) drug therapy: Secondary | ICD-10-CM

## 2023-06-06 DIAGNOSIS — H40211 Acute angle-closure glaucoma, right eye: Secondary | ICD-10-CM | POA: Diagnosis present

## 2023-06-06 DIAGNOSIS — Z801 Family history of malignant neoplasm of trachea, bronchus and lung: Secondary | ICD-10-CM

## 2023-06-06 DIAGNOSIS — Z86000 Personal history of in-situ neoplasm of breast: Secondary | ICD-10-CM | POA: Diagnosis not present

## 2023-06-06 DIAGNOSIS — Z87891 Personal history of nicotine dependence: Secondary | ICD-10-CM | POA: Diagnosis not present

## 2023-06-06 DIAGNOSIS — Z1152 Encounter for screening for COVID-19: Secondary | ICD-10-CM

## 2023-06-06 DIAGNOSIS — Z556 Problems related to health literacy: Secondary | ICD-10-CM | POA: Diagnosis not present

## 2023-06-06 DIAGNOSIS — C50412 Malignant neoplasm of upper-outer quadrant of left female breast: Secondary | ICD-10-CM | POA: Diagnosis not present

## 2023-06-06 DIAGNOSIS — D494 Neoplasm of unspecified behavior of bladder: Secondary | ICD-10-CM | POA: Diagnosis present

## 2023-06-06 DIAGNOSIS — Z170 Estrogen receptor positive status [ER+]: Secondary | ICD-10-CM | POA: Diagnosis not present

## 2023-06-06 DIAGNOSIS — I2699 Other pulmonary embolism without acute cor pulmonale: Secondary | ICD-10-CM | POA: Diagnosis present

## 2023-06-06 LAB — CBC
HCT: 25.4 % — ABNORMAL LOW (ref 36.0–46.0)
Hemoglobin: 8.1 g/dL — ABNORMAL LOW (ref 12.0–15.0)
MCH: 24.2 pg — ABNORMAL LOW (ref 26.0–34.0)
MCHC: 31.9 g/dL (ref 30.0–36.0)
MCV: 75.8 fL — ABNORMAL LOW (ref 80.0–100.0)
Platelets: 343 10*3/uL (ref 150–400)
RBC: 3.35 MIL/uL — ABNORMAL LOW (ref 3.87–5.11)
RDW: 18.9 % — ABNORMAL HIGH (ref 11.5–15.5)
WBC: 9.4 10*3/uL (ref 4.0–10.5)
nRBC: 0 % (ref 0.0–0.2)

## 2023-06-06 LAB — RESP PANEL BY RT-PCR (RSV, FLU A&B, COVID)  RVPGX2
Influenza A by PCR: NEGATIVE
Influenza B by PCR: NEGATIVE
Resp Syncytial Virus by PCR: NEGATIVE
SARS Coronavirus 2 by RT PCR: NEGATIVE

## 2023-06-06 LAB — BASIC METABOLIC PANEL
Anion gap: 10 (ref 5–15)
BUN: 16 mg/dL (ref 8–23)
CO2: 24 mmol/L (ref 22–32)
Calcium: 8.5 mg/dL — ABNORMAL LOW (ref 8.9–10.3)
Chloride: 104 mmol/L (ref 98–111)
Creatinine, Ser: 0.68 mg/dL (ref 0.44–1.00)
GFR, Estimated: >60 mL/min (ref >60)
Glucose, Bld: 91 mg/dL (ref 70–99)
Potassium: 3.1 mmol/L — ABNORMAL LOW (ref 3.5–5.1)
Sodium: 138 mmol/L (ref 135–145)

## 2023-06-06 LAB — ECHOCARDIOGRAM COMPLETE
AR max vel: 2.61 cm2
AV Area VTI: 2.75 cm2
AV Area mean vel: 2.37 cm2
AV Mean grad: 4 mm[Hg]
AV Peak grad: 8.8 mm[Hg]
Ao pk vel: 1.48 m/s
Area-P 1/2: 3.5 cm2
Calc EF: 67.2 %
Est EF: >75
Height: 60 in
MV VTI: 2.87 cm2
S' Lateral: 2.2 cm
Single Plane A2C EF: 67.2 %
Single Plane A4C EF: 69.8 %
Weight: 1520 [oz_av]

## 2023-06-06 LAB — TROPONIN I (HIGH SENSITIVITY)
Troponin I (High Sensitivity): 3 ng/L (ref <18)
Troponin I (High Sensitivity): 4 ng/L (ref <18)

## 2023-06-06 LAB — BRAIN NATRIURETIC PEPTIDE: B Natriuretic Peptide: 111.4 pg/mL — ABNORMAL HIGH (ref 0.0–100.0)

## 2023-06-06 LAB — HEPARIN LEVEL (UNFRACTIONATED): Heparin Unfractionated: <0.1 [IU]/mL — ABNORMAL LOW (ref 0.30–0.70)

## 2023-06-06 LAB — MAGNESIUM: Magnesium: 1.8 mg/dL (ref 1.7–2.4)

## 2023-06-06 MED ORDER — TRAZODONE HCL 50 MG PO TABS
25.0000 mg | ORAL_TABLET | Freq: Every evening | ORAL | Status: DC | PRN
  Administered 2023-06-07: 25 mg via ORAL
  Filled 2023-06-06: qty 1

## 2023-06-06 MED ORDER — TETRACAINE HCL 0.5 % OP SOLN
1.0000 [drp] | Freq: Once | OPHTHALMIC | Status: DC
  Filled 2023-06-06: qty 4

## 2023-06-06 MED ORDER — ONDANSETRON HCL 4 MG PO TABS: 4.0000 mg | ORAL_TABLET | Freq: Four times a day (QID) | ORAL | Status: DC | PRN

## 2023-06-06 MED ORDER — IOHEXOL 350 MG/ML SOLN
75.0000 mL | Freq: Once | INTRAVENOUS | Status: AC | PRN
  Administered 2023-06-06: 75 mL via INTRAVENOUS

## 2023-06-06 MED ORDER — ACETAMINOPHEN 650 MG RE SUPP: 650.0000 mg | Freq: Four times a day (QID) | RECTAL | Status: DC | PRN

## 2023-06-06 MED ORDER — HEPARIN BOLUS VIA INFUSION
1500.0000 [IU] | Freq: Once | INTRAVENOUS | Status: AC
  Administered 2023-06-06: 1500 [IU] via INTRAVENOUS
  Filled 2023-06-06: qty 1500

## 2023-06-06 MED ORDER — ONDANSETRON HCL 4 MG/2ML IJ SOLN: 4.0000 mg | Freq: Four times a day (QID) | INTRAMUSCULAR | Status: DC | PRN

## 2023-06-06 MED ORDER — HEPARIN (PORCINE) 25000 UT/250ML-% IV SOLN
950.0000 [IU]/h | INTRAVENOUS | Status: DC
  Administered 2023-06-06: 650 [IU]/h via INTRAVENOUS
  Filled 2023-06-06: qty 250

## 2023-06-06 MED ORDER — ACETAMINOPHEN 325 MG PO TABS
650.0000 mg | ORAL_TABLET | Freq: Four times a day (QID) | ORAL | Status: DC | PRN
  Administered 2023-06-07 (×2): 650 mg via ORAL
  Filled 2023-06-06 (×2): qty 2

## 2023-06-06 MED ORDER — DIPHENHYDRAMINE HCL 50 MG/ML IJ SOLN
12.5000 mg | Freq: Once | INTRAMUSCULAR | Status: AC
  Administered 2023-06-06: 12.5 mg via INTRAVENOUS
  Filled 2023-06-06: qty 1

## 2023-06-06 MED ORDER — POTASSIUM CHLORIDE CRYS ER 20 MEQ PO TBCR
40.0000 meq | EXTENDED_RELEASE_TABLET | ORAL | Status: AC
  Administered 2023-06-06: 40 meq via ORAL
  Filled 2023-06-06: qty 2

## 2023-06-06 MED ORDER — ALBUTEROL SULFATE (2.5 MG/3ML) 0.083% IN NEBU: 2.5000 mg | INHALATION_SOLUTION | RESPIRATORY_TRACT | Status: DC | PRN

## 2023-06-06 MED ORDER — METOCLOPRAMIDE HCL 5 MG/ML IJ SOLN
5.0000 mg | Freq: Once | INTRAMUSCULAR | Status: AC
  Administered 2023-06-06: 5 mg via INTRAVENOUS
  Filled 2023-06-06: qty 2

## 2023-06-06 MED ORDER — IPRATROPIUM-ALBUTEROL 0.5-2.5 (3) MG/3ML IN SOLN
3.0000 mL | Freq: Once | RESPIRATORY_TRACT | Status: AC
  Administered 2023-06-06: 3 mL via RESPIRATORY_TRACT
  Filled 2023-06-06: qty 3

## 2023-06-06 MED ORDER — POTASSIUM CHLORIDE CRYS ER 20 MEQ PO TBCR
40.0000 meq | EXTENDED_RELEASE_TABLET | Freq: Once | ORAL | Status: AC
Start: 2023-06-06 — End: 2023-06-06
  Administered 2023-06-06: 40 meq via ORAL
  Filled 2023-06-06: qty 2

## 2023-06-06 NOTE — Progress Notes (Signed)
PHARMACY - ANTICOAGULATION CONSULT NOTE  Pharmacy Consult for heparin Indication: pulmonary embolus  No Known Allergies  Patient Measurements: Height: 5' (152.4 cm) Weight: 43.1 kg (95 lb) IBW/kg (Calculated) : 45.5 Heparin Dosing Weight: 43.1 kg  Vital Signs: Temp: 98.4 F (36.9 C) (02/15 0628) Temp Source: Oral (02/15 0628) BP: 112/91 (02/15 0830) Pulse Rate: 83 (02/15 0845)  Labs: Recent Labs    06/06/23 0658 06/06/23 0831  HGB 8.1*  --   HCT 25.4*  --   PLT 343  --   CREATININE 0.68  --   TROPONINIHS  --  3    Estimated Creatinine Clearance: 35.6 mL/min (by C-G formula based on SCr of 0.68 mg/dL).   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  Pharmacy consulted to manage heparin for PE 1/29:  Cystoscopy with TURBT medium 2.  Intravesical instillation of gemcitabine for bladder tumor by Dr Liliane Shi 2/15 CT: Small volume acute occlusive and nonocclusive pulmonary embolism, No right heart strain or lung infarction. No anticoagulants PTA Hg 8.1 - low PLT 343 WNL SCr WNL Hx hematuria d/t bladder tumor, none mentioned in chart notes today  Goal of Therapy:  Heparin level 0.3-0.7 units/ml Monitor platelets by anticoagulation protocol: Yes   Plan:  Give 1500 units bolus x 1 Start heparin infusion at 650 units/hr Check anti-Xa level in 8 hours and daily while on heparin Continue to monitor H&H and platelets  Herby Abraham, Pharm.D Use secure chat for questions 06/06/2023 10:04 AM

## 2023-06-06 NOTE — ED Provider Notes (Signed)
Care of patient assumed from Dr. Bebe Shaggy.  This patient presents with shortness of breath, onset overnight.  She has had a recent cough for the past several weeks.  Currently maintaining normal SpO2 on room air.  Vital signs are normal.  Workup results pending. Physical Exam  BP 126/68 (BP Location: Left Arm)   Pulse 90   Temp 98.4 F (36.9 C) (Oral)   Resp 20   Ht 5' (1.524 m)   Wt 43.1 kg   SpO2 99%   BMI 18.55 kg/m   Physical Exam Vitals and nursing note reviewed.  Constitutional:      General: She is not in acute distress.    Appearance: She is well-developed. She is not ill-appearing, toxic-appearing or diaphoretic.  HENT:     Head: Normocephalic and atraumatic.     Mouth/Throat:     Mouth: Mucous membranes are moist.  Eyes:     Conjunctiva/sclera:     Right eye: Right conjunctiva is injected.     Left eye: Left conjunctiva is not injected.  Cardiovascular:     Rate and Rhythm: Normal rate and regular rhythm.     Heart sounds: No murmur heard. Pulmonary:     Effort: Pulmonary effort is normal. No respiratory distress.     Breath sounds: Normal breath sounds. No wheezing or rhonchi.  Chest:     Chest wall: No tenderness.  Abdominal:     Palpations: Abdomen is soft.     Tenderness: There is no abdominal tenderness.  Musculoskeletal:        General: No swelling. Normal range of motion.     Cervical back: Normal range of motion and neck supple.  Skin:    General: Skin is warm and dry.     Capillary Refill: Capillary refill takes less than 2 seconds.  Neurological:     General: No focal deficit present.     Mental Status: She is alert and oriented to person, place, and time.  Psychiatric:        Mood and Affect: Mood normal.        Behavior: Behavior normal.     Procedures  Procedures  ED Course / MDM   Clinical Course as of 06/06/23 0710  Sat Jun 06, 2023  0646 Patient with extensive history including breast cancer, recent bladder tumor with recent  cystoscopy, previous history of pericardial effusion requiring pericardial window and VATS presents with recent cough for the past several weeks now feeling more short of breath.  Patient is in no acute distress at this time.  Imaging/labs pending at this time [DW]  0703 Discussed the case with Dr. Durwin Nora, follow-up on labs and imaging and reassess [DW]    Clinical Course User Index [DW] Zadie Rhine, MD   Medical Decision Making Amount and/or Complexity of Data Reviewed Labs: ordered. Radiology: ordered.  Risk Prescription drug management. Decision regarding hospitalization.   Initial lab work notable for hypokalemia.  Replacement potassium was ordered.  On assessment, patient's breathing is unlabored.  She is on 1.5 L of supplemental oxygen.  No wheezing is appreciated on lung auscultation.  Right eye does show conjunctival injection.  She did recently undergo patient procedure for acute angle-closure glaucoma.  She states that the pain in her right eye has improved, although she does have a generalized headache currently.  Reglan and Benadryl were ordered.  IOP of right eye is 19.  Underwent CTA of chest which does not fact show PEs.  Heparin was ordered.  Patient was admitted for further management.       Gloris Manchester, MD 06/06/23 1053

## 2023-06-06 NOTE — Progress Notes (Signed)
  Echocardiogram 2D Echocardiogram has been performed.  Ocie Doyne RDCS 06/06/2023, 3:33 PM

## 2023-06-06 NOTE — Progress Notes (Signed)
PHARMACY - ANTICOAGULATION CONSULT NOTE  Pharmacy Consult for heparin Indication: pulmonary embolus  No Known Allergies  Patient Measurements: Height: 5' (152.4 cm) Weight: 43.1 kg (95 lb) IBW/kg (Calculated) : 45.5 Heparin Dosing Weight: 43.1 kg  Vital Signs: Temp: 100.1 F (37.8 C) (02/15 1626) Temp Source: Oral (02/15 1626) BP: 142/70 (02/15 1626) Pulse Rate: 94 (02/15 1626)  Labs: Recent Labs    06/06/23 0658 06/06/23 0831 06/06/23 1055 06/06/23 1955  HGB 8.1*  --   --   --   HCT 25.4*  --   --   --   PLT 343  --   --   --   HEPARINUNFRC  --   --   --  <0.10*  CREATININE 0.68  --   --   --   TROPONINIHS  --  3 4  --     Estimated Creatinine Clearance: 35.6 mL/min (by C-G formula based on SCr of 0.68 mg/dL).   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  Pharmacy consulted to manage heparin for PE 1/29:  Cystoscopy with TURBT medium 2.  Intravesical instillation of gemcitabine for bladder tumor by Dr Liliane Shi 2/15 CT: Small volume acute occlusive and nonocclusive pulmonary embolism, No right heart strain or lung infarction. No anticoagulants PTA Hg 8.1 - low PLT 343 WNL SCr WNL Hx hematuria d/t bladder tumor, none mentioned in chart notes today  06/06/2023  First heparin level undetectable 8 hours after 1500 unit bolus & drip at 650 units/hr Per RN heparin is infusing with no issues & no bleeding complication  Goal of Therapy:  Heparin level 0.3-0.7 units/ml Monitor platelets by anticoagulation protocol: Yes   Plan:  Repeat Heparin bolus 1500 units IV x 1 Increase heparin drip to 800 units/hr Check heparin level 8 hrs after bolus & rate increase Daily CBC & heparin level  Herby Abraham, Pharm.D Use secure chat for  questions 06/06/2023 8:30 PM

## 2023-06-06 NOTE — H&P (Signed)
History and Physical  Nicole Rush ZOX:[id number] DOB: 1941-02-05 DOA: 06/06/2023  PCP: Leilani Able, MD   Chief Complaint: Shortness of breath  HPI: Nicole Rush is a 83 y.o. female with medical history significant for hypertension, CVA without deficit, history of left breast cancer being admitted to the hospital with several days of shortness of breath pleuritic chest pain found to have small volume subsegmental acute PE.  History is provided by the patient, though she is not a very good historian.  States that she has not been feeling well for the last several days, has a wet sounding cough.  Denies any fever, states that she had some swelling in her feet bilaterally.  She denies any pain or swelling in her calf.  Denies any recent weight loss.  Has been feeling short of breath the last few days, worse with lying flat.  States that she has also been having a little bit of chest pain, seems that it is worse with deep breaths.  Currently denies any chest pain but says that if he tries to take a deep breath, it makes her feel like she cannot breathe very well.  Evaluation as noted in the ER below, consistent with acute PE.  She has been started on IV heparin drip.  Review of Systems: Please see HPI for pertinent positives and negatives. A complete 10 system review of systems are otherwise negative.  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   Complication of anesthesia    Dyspnea    Hypertension    Stroke (HCC)    per pt no defecits   Past Surgical History:  Procedure Laterality Date   BREAST LUMPECTOMY WITH AXILLARY LYMPH NODE BIOPSY Left 04/06/2019   Procedure: LEFT BREAST LUMPECTOMY WITH LEFT SENTINEL LYMPH NODE BIOPSY;  Surgeon: Almond Lint, MD;  Location: [facility];  Service: General;  Laterality: Left;   BREAST SURGERY     RE-EXCISION OF BREAST LUMPECTOMY Left 05/04/2019   Procedure: RE-EXCISION OF LEFT  BREAST LUMPECTOMY;  Surgeon: Almond Lint, MD;  Location: [facility];  Service: General;  Laterality: Left;   Social History:  reports that she has quit smoking. She has been exposed to tobacco smoke. She has never used smokeless tobacco. She reports that she does not drink alcohol and does not use drugs.  No Known Allergies  Family History  Problem Relation Age of Onset   Breast cancer Mother    Lung cancer Father      Prior to Admission medications   Medication Sig Start Date End Date Taking? Authorizing Provider  anastrozole (ARIMIDEX) 1 MG tablet Take 1 tablet (1 mg total) by mouth daily. 11/06/20   Serena Croissant, MD  Colchicine (MITIGARE) 0.6 MG CAPS Take 0.6 mg by mouth daily at 6 (six) AM. Patient not taking: Reported on 03/19/2023 05/31/21   Sande Rives, MD  DM-Phenylephrine-Acetaminophen [place] DAYQUIL COLD & FLU PO) Take 1 Dose by mouth 3 (three) times daily as needed (cough/fever/cold symptoms). Patient not taking: Reported on 03/19/2023    [provider]  gabapentin (NEURONTIN) 400 MG capsule Take 400 mg by mouth 3 (three) times daily. 03/04/23   [provider]  ibuprofen (ADVIL) 600 MG tablet Take 600 mg by mouth every 6 (six) hours as needed. 02/17/23   [provider]  loratadine (CLARITIN) 10 MG tablet Take 1 tablet (10 mg total) by mouth daily.  Patient not taking: Reported on 03/19/2023 04/06/21   Glade Lloyd, MD  magnesium oxide (MAG-OX) 400 MG tablet Take 1 tablet by mouth at bedtime. Patient not taking: Reported on 03/19/2023 02/17/23   [provider]  Multiple Vitamin (MULTIVITAMIN WITH MINERALS) TABS tablet Take 1 tablet by mouth daily. Patient not taking: Reported on 03/19/2023    [provider]  MYRBETRIQ 25 MG TB24 tablet Take 25 mg by mouth daily. Patient not taking: Reported on 03/19/2023 03/12/21   [provider]  ondansetron (ZOFRAN-ODT) 4 MG disintegrating tablet Take 1  tablet (4 mg total) by mouth every 8 (eight) hours as needed. 05/14/23   Tilden Fossa, MD  oxybutynin (DITROPAN) 5 MG tablet Take 1 tablet (5 mg total) by mouth every 8 (eight) hours as needed for bladder spasms. 05/20/23   Rene Paci, MD  pantoprazole (PROTONIX) 40 MG tablet Take 1 tablet (40 mg total) by mouth daily. Patient not taking: Reported on 03/19/2023 04/07/21   Glade Lloyd, MD  phenazopyridine (PYRIDIUM) 200 MG tablet Take 1 tablet (200 mg total) by mouth 3 (three) times daily as needed (for pain with urination). 05/20/23 05/19/24  Rene Paci, MD  polyethylene glycol (MIRALAX / GLYCOLAX) 17 g packet Take 17 g by mouth daily as needed. Patient taking differently: Take 17 g by mouth daily as needed for moderate constipation. 04/06/21   Glade Lloyd, MD  senna-docusate (SENOKOT-S) 8.6-50 MG tablet Take 1 tablet by mouth 2 (two) times daily. Patient not taking: Reported on 03/19/2023 04/06/21   Glade Lloyd, MD    Physical Exam: BP (!) 112/91   Pulse 83   Temp 98.4 F (36.9 C) (Oral)   Resp (!) 23   Ht 5' (1.524 m)   Wt 43.1 kg   SpO2 98%   BMI 18.55 kg/m  General:  Alert, oriented, calm, in no acute distress, thin elderly female appearing her stated age.  Resting comfortably on room air. Eyes: EOMI, clear conjuctivae, white sclerea Neck: supple, no masses, trachea mildline  Cardiovascular: RRR, no murmurs or rubs, no peripheral edema  Respiratory: clear to auscultation bilaterally, no wheezes, no crackles  Abdomen: soft, nontender, nondistended, normal bowel tones heard  Skin: dry, no rashes  Musculoskeletal: no joint effusions, normal range of motion  Psychiatric: appropriate affect, normal speech  Neurologic: extraocular muscles intact, clear speech, moving all extremities with intact sensorium         Labs on Admission:  Basic Metabolic Panel: Recent Labs  Lab 06/06/23 0658 06/06/23 0831  NA 138  --   K 3.1*  --   CL 104  --    CO2 24  --   GLUCOSE 91  --   BUN 16  --   CREATININE 0.68  --   CALCIUM 8.5*  --   MG  --  1.8   Liver Function Tests: No results for input(s): "AST", "ALT", "ALKPHOS", "BILITOT", "PROT", "ALBUMIN" in the last 168 hours. No results for input(s): "LIPASE", "AMYLASE" in the last 168 hours. No results for input(s): "AMMONIA" in the last 168 hours. CBC: Recent Labs  Lab 06/06/23 0658  WBC 9.4  HGB 8.1*  HCT 25.4*  MCV 75.8*  PLT 343   Cardiac Enzymes: No results for input(s): "CKTOTAL", "CKMB", "CKMBINDEX", "TROPONINI" in the last 168 hours. BNP (last 3 results) Recent Labs    06/06/23 0658  BNP 111.4*    ProBNP (last 3 results) No results for input(s): "PROBNP" in the last 8760 hours.  CBG: No results for input(s): "GLUCAP" in the last 168 hours.  Radiological Exams on Admission: CT Angio Chest PE W and/or Wo Contrast Addendum Date: 06/06/2023 ADDENDUM REPORT: 06/06/2023 09:58 ADDENDUM: Critical Value/emergent results were called by telephone at the time of interpretation on 06/06/2023 at 9:52 am to provider Gloris Manchester , who verbally acknowledged these results. Electronically Signed   By: Jules Schick M.D.   On: 06/06/2023 09:58   Result Date: 06/06/2023 CLINICAL DATA:  Pulmonary embolism (PE) suspected, high prob. Shortness of breath. Cough and headache. EXAM: CT ANGIOGRAPHY CHEST WITH CONTRAST TECHNIQUE: Multidetector CT imaging of the chest was performed using the standard protocol during bolus administration of intravenous contrast. Multiplanar CT image reconstructions and MIPs were obtained to evaluate the vascular anatomy. RADIATION DOSE REDUCTION: This exam was performed according to the departmental dose-optimization program which includes automated exposure control, adjustment of the mA and/or kV according to patient size and/or use of iterative reconstruction technique. CONTRAST:  75mL OMNIPAQUE IOHEXOL 350 MG/ML SOLN COMPARISON:  CT scan chest from 06/17/2021.  FINDINGS: Cardiovascular: Small volume acute pulmonary embolism noted. There is nonocclusive pulmonary embolism in the left lung lower lobe posterior segmental pulmonary artery branch with extension into the subsegmental pulmonary artery branches. There is also small volume occlusive pulmonary embolism in the right lung upper lobe segmental pulmonary artery branch and very small volume nonocclusive embolism in the distal subsegmental pulmonary branch to the right lung lower lobe posterior segment. No right heart strain or lung infarction. There is dilation of the main pulmonary trunk measuring up to 3.2 cm, which is nonspecific but can be seen with pulmonary artery hypertension. Normal cardiac size. No pericardial effusion. No aortic aneurysm. There are coronary artery calcifications, in keeping with coronary artery disease. There are also mild peripheral atherosclerotic vascular calcifications of thoracic aorta and its major branches. Mediastinum/Nodes: Visualized thyroid gland appears grossly unremarkable. No solid / cystic mediastinal masses. The esophagus is nondistended precluding optimal assessment. There is soft tissue fullness in the mediastinum and bilateral hila, favored to represent enlarged lymph nodes which appears more pronounced than the prior exam however, favored benign/reactive. Correlate clinically to determine the need for follow-up imaging in 3-6 months to document resolution. No axillary lymphadenopathy by size criteria. Lungs/Pleura: The central tracheo-bronchial tree is patent. Upper lobe predominant moderate emphysematous changes noted. There are patchy areas of linear, plate-like atelectasis and/or scarring throughout bilateral lungs, with asymmetric more involvement of right lung lower lobe. No mass or consolidation. No pleural effusion or pneumothorax. No suspicious lung nodules. Upper Abdomen: Visualized upper abdominal viscera within normal limits. Musculoskeletal: The visualized soft  tissues of the chest wall are grossly unremarkable. No suspicious osseous lesions. There are mild multilevel degenerative changes in the visualized spine. Review of the MIP images confirms the above findings. IMPRESSION: 1. Small volume acute occlusive and nonocclusive pulmonary embolism, as described in detail above. No right heart strain or lung infarction. 2. Mild interval increase in the size of pre-existing mediastinal and hilar lymphadenopathy, favored benign/reactive. However, correlate clinically to determine the need for follow-up imaging in 3-6 months to document resolution. 3. There are patchy areas of scarring/atelectasis throughout bilateral lungs with asymmetric more involvement of right lung lower lobe. However, no lung mass, consolidation, pleural effusion or pneumothorax. 4. Multiple other nonacute observations, as described above. Aortic Atherosclerosis (ICD10-I70.0) and Emphysema (ICD10-J43.9). Electronically Signed: By: Jules Schick M.D. On: 06/06/2023 09:46   DG Chest Port 1 View Result Date: 06/06/2023 CLINICAL DATA:  Shortness of  breath EXAM: PORTABLE CHEST 1 VIEW COMPARISON:  05/14/2023 FINDINGS: Normal heart size and mediastinal contours.  Aortic tortuosity. Streaky density at the right lung base compatible with scarring seen on chest CT 06/17/2021. There is no edema, consolidation, effusion, or pneumothorax. Postoperative left breast and axilla. IMPRESSION: Cardiomegaly without edema or focal pneumonia. Electronically Signed   By: Tiburcio Pea M.D.   On: 06/06/2023 07:31   Assessment/Plan Nicole Rush is a 83 y.o. female with medical history significant for hypertension, CVA without deficit, history of left breast cancer being admitted to the hospital with several days of shortness of breath pleuritic chest pain found to have small volume subsegmental acute PE.   Acute PE-small volume, without evidence of right heart strain.  Without tachycardia, significant hypotension,  or hypoxia.  Denies any recent injury, or travel.  Suspect this is from DVT, and concerning for cancer recurrence given her history of breast DCIS. -Inpatient admission -Monitor on telemetry -IV heparin drip -Check 2D echo -Check bilateral lower extremity Dopplers  History of breast DCIS-treated with lumpectomy in 2020, and anastrozole.  Patient states she is no longer on anastrozole but unable to tell me why.  Would recommend outpatient imaging and follow-up with oncology.  GERD-Protonix daily  Glaucoma-will continue home eyedrops once reconciled     Code Status: Full Code  Consults called: None  Admission status: The appropriate patient status for this patient is INPATIENT. Inpatient status is judged to be reasonable and necessary in order to provide the required intensity of service to ensure the patient's safety. The patient's presenting symptoms, physical exam findings, and initial radiographic and laboratory data in the context of their chronic comorbidities is felt to place them at high risk for further clinical deterioration. Furthermore, it is not anticipated that the patient will be medically stable for discharge from the hospital within 2 midnights of admission.    I certify that at the point of admission it is my clinical judgment that the patient will require inpatient hospital care spanning beyond 2 midnights from the point of admission due to high intensity of service, high risk for further deterioration and high frequency of surveillance required  Time spent: 55 minutes  Shalana Jardin Sharlette Dense MD [facility] Pager [phone]  If 7PM-7AM, please contact night-coverage [email or web address] Password [facility]  06/06/2023, 10:38 AM

## 2023-06-06 NOTE — ED Provider Notes (Signed)
[place] EMERGENCY DEPARTMENT AT [facility] Provider Note   CSN: [id number] Arrival date & time: 06/06/23  1191     History  Chief Complaint  Patient presents with   Shortness of Breath    Nicole Rush is a 83 y.o. female.  The history is provided by the patient.  Shortness of Breath Severity:  Moderate Onset quality:  Gradual Progression:  Worsening Chronicity:  New Associated symptoms: cough   Associated symptoms: no chest pain and no fever   Patient with history of breast cancer, presents with cough and shortness of breath.  It is reported patient had increasing cough for the past several weeks.  Tonight patient felt more short of breath particular lying flat.  No chest pain.  No hemoptysis.  No fevers or vomiting.  Patient is a former smoker. 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 Authorizing Provider  anastrozole (ARIMIDEX) 1 MG tablet Take 1 tablet (1 mg total) by mouth daily. 11/06/20   Serena Croissant, MD  Colchicine (MITIGARE) 0.6 MG CAPS Take 0.6 mg by mouth daily at 6 (six) AM. Patient not taking: Reported on 03/19/2023 05/31/21   Sande Rives, MD  DM-Phenylephrine-Acetaminophen [facility] DAYQUIL COLD & FLU PO) Take 1 Dose by mouth 3 (three) times daily as needed (cough/fever/cold symptoms). Patient not taking: Reported on 03/19/2023    [provider]  gabapentin (NEURONTIN) 400 MG capsule Take 400 mg by mouth 3 (three) times daily. 03/04/23   [provider]  ibuprofen (ADVIL) 600 MG tablet Take 600 mg by mouth every 6 (six) hours as needed. 02/17/23   [provider]  loratadine (CLARITIN) 10 MG tablet Take 1  tablet (10 mg total) by mouth daily. Patient not taking: Reported on 03/19/2023 04/06/21   Glade Lloyd, MD  magnesium oxide (MAG-OX) 400 MG tablet Take 1 tablet by mouth at bedtime. Patient not taking: Reported on 03/19/2023 02/17/23   [provider]  Multiple Vitamin (MULTIVITAMIN WITH MINERALS) TABS tablet Take 1 tablet by mouth daily. Patient not taking: Reported on 03/19/2023    [provider]  MYRBETRIQ 25 MG TB24 tablet Take 25 mg by mouth daily. Patient not taking: Reported on 03/19/2023 03/12/21   [provider]  ondansetron (ZOFRAN-ODT) 4 MG disintegrating tablet Take 1 tablet (4 mg total) by mouth every 8 (eight) hours as needed. 05/14/23   Tilden Fossa, MD  oxybutynin (DITROPAN) 5 MG tablet Take 1 tablet (5 mg total) by mouth every 8 (eight) hours as needed for bladder spasms. 05/20/23   Rene Paci, MD  pantoprazole (PROTONIX) 40 MG tablet Take 1 tablet (40 mg total) by mouth daily. Patient not taking: Reported on 03/19/2023 04/07/21   Glade Lloyd, MD  phenazopyridine (PYRIDIUM) 200 MG tablet Take 1 tablet (200 mg total) by mouth 3 (three) times daily as needed (for pain with urination). 05/20/23 05/19/24  Rene Paci, MD  polyethylene glycol (MIRALAX / GLYCOLAX) 17 g packet Take 17 g by mouth daily as needed. Patient taking differently: Take 17 g by mouth daily as needed for moderate constipation. 04/06/21  Glade Lloyd, MD  senna-docusate (SENOKOT-S) 8.6-50 MG tablet Take 1 tablet by mouth 2 (two) times daily. Patient not taking: Reported on 03/19/2023 04/06/21   Glade Lloyd, MD      Allergies    Patient has no known allergies.    Review of Systems   Review of Systems  Constitutional:  Negative for fever.  Respiratory:  Positive for cough and shortness of breath.   Cardiovascular:  Negative for chest pain.    Physical Exam Updated Vital Signs BP 126/68 (BP Location: Left Arm)   Pulse 90   Temp 98.4 F  (36.9 C) (Oral)   Resp 20   Ht 1.524 m (5')   Wt 43.1 kg   SpO2 99%   BMI 18.55 kg/m  Physical Exam CONSTITUTIONAL: Elderly and frail HEAD: Normocephalic/atraumatic ENMT: Mucous membranes moist, poor dentition, no drooling, no stridor NECK: supple no meningeal signs, + JVD CV: S1/S2 noted, no murmurs/rubs/gallops noted LUNGS: Scattered wheezing bilaterally, mild tachypnea, no hypoxia on room air ABDOMEN: soft, nontender NEURO: Pt is awake/alert/appropriate, moves all extremitiesx4.  No facial droop.   EXTREMITIES: pulses normal/equal, full ROM, no lower extremity edema SKIN: warm, color normal  ED Results / Procedures / Treatments   Labs (all labs ordered are listed, but only abnormal results are displayed) Labs Reviewed  RESP PANEL BY RT-PCR (RSV, FLU A&B, COVID)  [id number]  CBC  BASIC METABOLIC PANEL  BRAIN NATRIURETIC PEPTIDE    EKG EKG Interpretation Date/Time:  Saturday June 06 2023 06:44:41 EST Ventricular Rate:  86 PR Interval:  150 QRS Duration:  75 QT Interval:  359 QTC Calculation: 430 R Axis:   79  Text Interpretation: Sinus rhythm Probable anteroseptal infarct, old No significant change since last tracing Confirmed by Zadie Rhine ([id number]) on 06/06/2023 6:51:44 AM  Radiology No results found.  Procedures Procedures    Medications Ordered in ED Medications  ipratropium-albuterol (DUONEB) 0.5-2.5 (3) MG/3ML nebulizer solution 3 mL (3 mLs Nebulization Given 06/06/23 0656)    ED Course/ Medical Decision Making/ A&P Clinical Course as of 06/06/23 0703  Sat Jun 06, 2023  0646 Patient with extensive history including breast cancer, recent bladder tumor with recent cystoscopy, previous history of pericardial effusion requiring pericardial window and VATS presents with recent cough for the past several weeks now feeling more short of breath.  Patient is in no acute distress at this time.  Imaging/labs pending at this time [DW]  0703 Discussed the case  with Dr. Durwin Nora, follow-up on labs and imaging and reassess [DW]    Clinical Course User Index [DW] Zadie Rhine, MD                                 Medical Decision Making Amount and/or Complexity of Data Reviewed Labs: ordered. Radiology: ordered.  Risk Prescription drug management.   This patient presents to the ED for concern of shortness of breath, this involves an extensive number of treatment options, and is a complaint that carries with it a high risk of complications and morbidity.  The differential diagnosis includes but is not limited to Acute coronary syndrome, pneumonia, acute pulmonary edema, pneumothorax, acute anemia, pulmonary embolism   Comorbidities that complicate the patient evaluation: Patient's presentation is complicated by their history of breast cancer  Social Determinants of Health: Patient's  poor health literacy   increases the complexity of managing their presentation  Additional history obtained: Records reviewed previous admission documents  Complexity of problems addressed: Patient's presentation is most consistent with  acute presentation with potential threat to life or bodily function          Final Clinical Impression(s) / ED Diagnoses Final diagnoses:  SOB (shortness of breath)    Rx / DC Orders ED Discharge Orders     None         Zadie Rhine, MD 06/06/23 [phone]

## 2023-06-06 NOTE — ED Triage Notes (Signed)
PT BIB EMS coming from home c/o of shob. States having a cough and headache for a couple of weeks now. Woke up today and felt worse. Pt's states being expose to the flu.   EMS: 136/78, HR 84

## 2023-06-06 NOTE — Progress Notes (Signed)
BLE venous duplex has been completed.   Results can be found under chart review under CV PROC. 06/06/2023 4:45 PM Rumaisa Schnetzer RVT, RDMS

## 2023-06-07 DIAGNOSIS — I2699 Other pulmonary embolism without acute cor pulmonale: Secondary | ICD-10-CM

## 2023-06-07 DIAGNOSIS — J9601 Acute respiratory failure with hypoxia: Secondary | ICD-10-CM | POA: Diagnosis not present

## 2023-06-07 LAB — URINALYSIS, W/ REFLEX TO CULTURE (INFECTION SUSPECTED)
Bacteria, UA: NONE SEEN
Bilirubin Urine: NEGATIVE
Glucose, UA: NEGATIVE mg/dL
Ketones, ur: NEGATIVE mg/dL
Nitrite: NEGATIVE
Protein, ur: 30 mg/dL — AB
RBC / HPF: >50 RBC/hpf (ref 0–5)
Specific Gravity, Urine: 1.02 (ref 1.005–1.030)
WBC, UA: >50 WBC/hpf (ref 0–5)
pH: 5 (ref 5.0–8.0)

## 2023-06-07 LAB — CBC
HCT: 27.3 % — ABNORMAL LOW (ref 36.0–46.0)
Hemoglobin: 8.6 g/dL — ABNORMAL LOW (ref 12.0–15.0)
MCH: 24.2 pg — ABNORMAL LOW (ref 26.0–34.0)
MCHC: 31.5 g/dL (ref 30.0–36.0)
MCV: 76.9 fL — ABNORMAL LOW (ref 80.0–100.0)
Platelets: 367 10*3/uL (ref 150–400)
RBC: 3.55 MIL/uL — ABNORMAL LOW (ref 3.87–5.11)
RDW: 19.1 % — ABNORMAL HIGH (ref 11.5–15.5)
WBC: 9.7 10*3/uL (ref 4.0–10.5)
nRBC: 0 % (ref 0.0–0.2)

## 2023-06-07 LAB — BASIC METABOLIC PANEL
Anion gap: 10 (ref 5–15)
BUN: 14 mg/dL (ref 8–23)
CO2: 24 mmol/L (ref 22–32)
Calcium: 8.5 mg/dL — ABNORMAL LOW (ref 8.9–10.3)
Chloride: 104 mmol/L (ref 98–111)
Creatinine, Ser: 0.73 mg/dL (ref 0.44–1.00)
GFR, Estimated: >60 mL/min (ref >60)
Glucose, Bld: 90 mg/dL (ref 70–99)
Potassium: 4.1 mmol/L (ref 3.5–5.1)
Sodium: 138 mmol/L (ref 135–145)

## 2023-06-07 LAB — IRON AND TIBC
Iron: 18 ug/dL — ABNORMAL LOW (ref 28–170)
Saturation Ratios: 7 % — ABNORMAL LOW (ref 10.4–31.8)
TIBC: 248 ug/dL — ABNORMAL LOW (ref 250–450)
UIBC: 230 ug/dL

## 2023-06-07 LAB — FERRITIN: Ferritin: 143 ng/mL (ref 11–307)

## 2023-06-07 LAB — HEPARIN LEVEL (UNFRACTIONATED): Heparin Unfractionated: 0.12 [IU]/mL — ABNORMAL LOW (ref 0.30–0.70)

## 2023-06-07 MED ORDER — RIVAROXABAN 20 MG PO TABS: 20.0000 mg | ORAL_TABLET | Freq: Every day | ORAL | Status: DC

## 2023-06-07 MED ORDER — BRIMONIDINE TARTRATE 0.2 % OP SOLN
1.0000 [drp] | Freq: Three times a day (TID) | OPHTHALMIC | Status: DC
  Administered 2023-06-07 – 2023-06-08 (×3): 1 [drp] via OPHTHALMIC
  Filled 2023-06-07: qty 5

## 2023-06-07 MED ORDER — LABETALOL HCL 5 MG/ML IV SOLN: 10.0000 mg | INTRAVENOUS | Status: DC | PRN

## 2023-06-07 MED ORDER — HYDRALAZINE HCL 25 MG PO TABS: 25.0000 mg | ORAL_TABLET | ORAL | Status: DC | PRN

## 2023-06-07 MED ORDER — RIVAROXABAN 15 MG PO TABS
15.0000 mg | ORAL_TABLET | Freq: Two times a day (BID) | ORAL | Status: DC
  Administered 2023-06-07 – 2023-06-08 (×3): 15 mg via ORAL
  Filled 2023-06-07 (×4): qty 1

## 2023-06-07 MED ORDER — IPRATROPIUM-ALBUTEROL 0.5-2.5 (3) MG/3ML IN SOLN
3.0000 mL | Freq: Four times a day (QID) | RESPIRATORY_TRACT | Status: DC | PRN
  Administered 2023-06-08: 3 mL via RESPIRATORY_TRACT
  Filled 2023-06-07: qty 3

## 2023-06-07 MED ORDER — HEPARIN BOLUS VIA INFUSION
1200.0000 [IU] | Freq: Once | INTRAVENOUS | Status: AC
  Administered 2023-06-07: 1200 [IU] via INTRAVENOUS
  Filled 2023-06-07: qty 1200

## 2023-06-07 MED ORDER — LATANOPROST 0.005 % OP SOLN
1.0000 [drp] | Freq: Every day | OPHTHALMIC | Status: DC
  Administered 2023-06-07: 1 [drp] via OPHTHALMIC
  Filled 2023-06-07: qty 2.5

## 2023-06-07 MED ORDER — DORZOLAMIDE HCL-TIMOLOL MAL 2-0.5 % OP SOLN
1.0000 [drp] | Freq: Two times a day (BID) | OPHTHALMIC | Status: DC
  Administered 2023-06-07 – 2023-06-08 (×3): 1 [drp] via OPHTHALMIC
  Filled 2023-06-07: qty 10

## 2023-06-07 MED ORDER — DOCUSATE SODIUM 100 MG PO CAPS
100.0000 mg | ORAL_CAPSULE | Freq: Once | ORAL | Status: AC
  Administered 2023-06-07: 100 mg via ORAL
  Filled 2023-06-07: qty 1

## 2023-06-07 NOTE — Progress Notes (Addendum)
Patient Saturations on Room Air at Rest = spO2 97%  Patient Saturations on Room Air while Ambulating = sp02 84% .  Rested and performed pursed lip breathing for 1 minute with sp02 at 85%.  Patient Saturations on 3 Liters of oxygen while Ambulating = sp02 94%  At end of testing pt left in room on 3  Liters of oxygen.  Notified provider

## 2023-06-07 NOTE — Progress Notes (Signed)
Progress Note    Nicole Rush   ZOX:[id number]  DOB: Feb 11, 1941  DOA: 06/06/2023     1 PCP: Leilani Able, MD  Initial CC: dyspnea   Hospital Course: [name] is an 83 yo female with PMH HTN, CVA, left breast cancer who presented with shortness of breath and pleuritic chest pain. Patient was a poor historian on admission. CT angio chest was performed and positive for PE noted in the left lower lobe and right upper lobe.  Lower extremity duplex negative for DVT.  Echo negative for right heart strain.  She was started on a heparin drip and admitted for further evaluation.  Interval History:  Sitting in recliner when seen this morning.  Breathing is more comfortable.  Had no concerns otherwise.  Called and updated daughter, especially to discuss anticoagulation.  Xarelto preferred for now.  Will try to obtain price checks tomorrow as well.  Had some desaturations with ambulating and we are arranging for home oxygen as well.  Tentative plan for discharge Monday.  Assessment and Plan: * Acute pulmonary embolism (HCC) - Very immobile per patient and suspect provoked PE in setting of decreased mobility -Lower extremity duplex negative for DVT - Echo with preserved EF, greater than 75%, grade 1 diastolic dysfunction, no right heart strain - Has been on heparin -Discussed with her daughter who prefers Xarelto if possible; will start for now and still needs to have price checks performed on Monday  Acute respiratory failure with hypoxia (HCC) - Patient with adequate SpO2 on room air at rest but while ambulating dropped to 84% on room air.  She required 3 L of oxygen to maintain adequate saturations -Discussed with daughter and we will arrange for home O2  Malignant neoplasm of upper-outer quadrant of left breast in female, estrogen receptor positive (HCC) - treated with lumpectomy in 2020, and anastrozole. Patient states she is no longer on anastrozole; CTA chest notes mild interval  increase in size of pre-existing mediastinal and hilar lymphadenopathy.  Given history of underlying cancer, may need repeat imaging in 3 to 6 months  COPD with chronic bronchitis and emphysema (HCC) - no s/s exac - duoneb PRN  Microcytic hypochromic anemia - Check iron studies  Essential hypertension - No home meds noted - Using PRN meds for now   Old records reviewed in assessment of this patient  Antimicrobials:   DVT prophylaxis:   Rivaroxaban (XARELTO) tablet 15 mg  rivaroxaban (XARELTO) tablet 20 mg   Code Status:   Code Status: Full Code  Mobility Assessment (Last 72 Hours)     Mobility Assessment     Row Name 06/07/23 0835 06/06/23 2100 06/06/23 1220       Does patient have an order for bedrest or is patient medically unstable No - Continue assessment No - Continue assessment No - Continue assessment     What is the highest level of mobility based on the progressive mobility assessment? Level 5 (Walks with assist in room/hall) - Balance while stepping forward/back and can walk in room with assist - Complete Level 3 (Stands with assist) - Balance while standing  and cannot march in place Level 5 (Walks with assist in room/hall) - Balance while stepping forward/back and can walk in room with assist - Complete     Is the above level different from baseline mobility prior to current illness? Yes - Recommend PT order Yes - Recommend PT order --  Barriers to discharge: none Disposition Plan:  Home HH orders placed: n/a Status is: Inpt  Objective: Blood pressure 137/72, pulse 80, temperature 98.2 F (36.8 C), temperature source Oral, resp. rate (!) 22, height 5' (1.524 m), weight 43.1 kg, SpO2 97%.  Examination:  Physical Exam Constitutional:      Appearance: Normal appearance.  HENT:     Head: Normocephalic and atraumatic.     Mouth/Throat:     Mouth: Mucous membranes are moist.  Eyes:     Extraocular Movements: Extraocular movements intact.   Cardiovascular:     Rate and Rhythm: Normal rate and regular rhythm.  Pulmonary:     Effort: Pulmonary effort is normal. No respiratory distress.     Breath sounds: No wheezing.     Comments: Coarse breath sounds bilaterally Abdominal:     General: Bowel sounds are normal. There is no distension.     Palpations: Abdomen is soft.     Tenderness: There is no abdominal tenderness.  Musculoskeletal:        General: Normal range of motion.     Cervical back: Normal range of motion and neck supple.  Skin:    General: Skin is warm and dry.  Neurological:     General: No focal deficit present.     Mental Status: She is alert.  Psychiatric:        Mood and Affect: Mood normal.      Consultants:    Procedures:    Data Reviewed: Results for orders placed or performed during the hospital encounter of 06/06/23 (from the past 24 hours)  Heparin level (unfractionated)     Status: Abnormal   Collection Time: 06/06/23  7:55 PM  Result Value Ref Range   Heparin Unfractionated <0.10 (L) 0.30 - 0.70 IU/mL  Urinalysis, w/ Reflex to Culture (Infection Suspected) -Urine, Clean Catch     Status: Abnormal   Collection Time: 06/07/23  7:18 AM  Result Value Ref Range   Specimen Source URINE, CLEAN CATCH    Color, Urine YELLOW YELLOW   APPearance HAZY (A) CLEAR   Specific Gravity, Urine 1.020 1.005 - 1.030   pH 5.0 5.0 - 8.0   Glucose, UA NEGATIVE NEGATIVE mg/dL   Hgb urine dipstick LARGE (A) NEGATIVE   Bilirubin Urine NEGATIVE NEGATIVE   Ketones, ur NEGATIVE NEGATIVE mg/dL   Protein, ur 30 (A) NEGATIVE mg/dL   Nitrite NEGATIVE NEGATIVE   Leukocytes,Ua LARGE (A) NEGATIVE   RBC / HPF >50 0 - 5 RBC/hpf   WBC, UA >50 0 - 5 WBC/hpf   Bacteria, UA NONE SEEN NONE SEEN   Squamous Epithelial / HPF 6-10 0 - 5 /HPF   Mucus PRESENT    Budding Yeast PRESENT    Non Squamous Epithelial 0-5 (A) NONE SEEN  CBC     Status: Abnormal   Collection Time: 06/07/23  7:27 AM  Result Value Ref Range   WBC  9.7 4.0 - 10.5 K/uL   RBC 3.55 (L) 3.87 - 5.11 MIL/uL   Hemoglobin 8.6 (L) 12.0 - 15.0 g/dL   HCT 16.1 (L) 09.6 - 04.5 %   MCV 76.9 (L) 80.0 - 100.0 fL   MCH 24.2 (L) 26.0 - 34.0 pg   MCHC 31.5 30.0 - 36.0 g/dL   RDW 40.9 (H) 81.1 - 91.4 %   Platelets 367 150 - 400 K/uL   nRBC 0.0 0.0 - 0.2 %  Basic metabolic panel     Status: Abnormal   Collection Time:  06/07/23  7:27 AM  Result Value Ref Range   Sodium 138 135 - 145 mmol/L   Potassium 4.1 3.5 - 5.1 mmol/L   Chloride 104 98 - 111 mmol/L   CO2 24 22 - 32 mmol/L   Glucose, Bld 90 70 - 99 mg/dL   BUN 14 8 - 23 mg/dL   Creatinine, Ser 1.61 0.44 - 1.00 mg/dL   Calcium 8.5 (L) 8.9 - 10.3 mg/dL   GFR, Estimated >09 >60 mL/min   Anion gap 10 5 - 15  Heparin level (unfractionated)     Status: Abnormal   Collection Time: 06/07/23  7:27 AM  Result Value Ref Range   Heparin Unfractionated 0.12 (L) 0.30 - 0.70 IU/mL    I have reviewed pertinent nursing notes, vitals, labs, and images as necessary. I have ordered labwork to follow up on as indicated.  I have reviewed the last notes from staff over past 24 hours. I have discussed patient's care plan and test results with nursing staff, CM/SW, and other staff as appropriate.  Time spent: Greater than 50% of the 55 minute visit was spent in counseling/coordination of care for the patient as laid out in the A&P.   LOS: 1 day   Lewie Chamber, MD [facility] 06/07/2023, 12:09 PM

## 2023-06-07 NOTE — Progress Notes (Signed)
PHARMACY - ANTICOAGULATION CONSULT NOTE  Pharmacy Consult for heparin Indication: pulmonary embolus  No Known Allergies  Patient Measurements: Height: 5' (152.4 cm) Weight: 43.1 kg (95 lb) IBW/kg (Calculated) : 45.5 Heparin Dosing Weight: 43.1 kg  Vital Signs: Temp: 99.2 F (37.3 C) (02/16 0148) Temp Source: Oral (02/16 0148) BP: 159/85 (02/16 0148) Pulse Rate: 96 (02/16 0148)  Labs: Recent Labs    06/06/23 0658 06/06/23 0831 06/06/23 1055 06/06/23 1955 06/07/23 0727  HGB 8.1*  --   --   --  8.6*  HCT 25.4*  --   --   --  27.3*  PLT 343  --   --   --  367  HEPARINUNFRC  --   --   --  <0.10* 0.12*  CREATININE 0.68  --   --   --  0.73  TROPONINIHS  --  3 4  --   --     Estimated Creatinine Clearance: 35.6 mL/min (by C-G formula based on SCr of 0.73 mg/dL).   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  Pharmacy consulted to manage heparin for PE.  2/15 CT: Small volume acute occlusive and nonocclusive pulmonary embolism, no right heart strain or lung infarction. No anticoagulants PTA   06/07/2023  -Heparin level 0.12, subtherapeutic with heparin infusion at 800 units/hr -Hgb low but stable, plt WNL -No complications of therapy noted; RN reports no line issues  Goal of Therapy:  Heparin level 0.3-0.7 units/ml Monitor platelets by anticoagulation protocol: Yes   Plan:  -Heparin bolus 1200 units IV x 1 -Increase heparin drip to 950 units/hr -Check heparin level 8 hrs after rate increase -Daily CBC & heparin level  Pricilla Riffle, PharmD, BCPS Clinical Pharmacist 06/07/2023 8:17 AM

## 2023-06-07 NOTE — Assessment & Plan Note (Signed)
-   treated with lumpectomy in 2020, and anastrozole. Patient states she is no longer on anastrozole; CTA chest notes mild interval increase in size of pre-existing mediastinal and hilar lymphadenopathy.  Given history of underlying cancer, may need repeat imaging in 3 to 6 months

## 2023-06-07 NOTE — Hospital Course (Signed)
Nicole Rush is an 83 yo female with PMH HTN, CVA, left breast cancer who presented with shortness of breath and pleuritic chest pain. Patient was a poor historian on admission. CT angio chest was performed and positive for PE noted in the left lower lobe and right upper lobe.  Lower extremity duplex negative for DVT.  Echo negative for right heart strain.  She was started on a heparin drip and admitted for further evaluation.

## 2023-06-07 NOTE — Assessment & Plan Note (Signed)
-   No home meds noted - Using PRN meds for now

## 2023-06-07 NOTE — Assessment & Plan Note (Signed)
-   Very immobile per patient and suspect provoked PE in setting of decreased mobility -Lower extremity duplex negative for DVT - Echo with preserved EF, greater than 75%, grade 1 diastolic dysfunction, no right heart strain - Has been on heparin -Discussed with her daughter who prefers Xarelto; patient still has deductible to meet; discussed pricing with daughter prior to discharge.  Once deductible met, prescription will be $47 per month per pharmacy -Given bilateral PE and immobility with recent surgeries, recommend to continue for 6 months on treatment; discussed with daughter

## 2023-06-07 NOTE — Discharge Instructions (Signed)
Information on my medicine - XARELTO (rivaroxaban)  This medication education was reviewed with me or my healthcare representative as part of my discharge preparation.   WHY WAS XARELTO PRESCRIBED FOR YOU? Xarelto was prescribed to treat blood clots that may have been found in the veins of your legs (deep vein thrombosis) or in your lungs (pulmonary embolism) and to reduce the risk of them occurring again.  What do you need to know about Xarelto? The starting dose is one 15 mg tablet taken TWICE daily with food for the FIRST 21 DAYS then on Sunday 06/28/2023  the dose is changed to one 20 mg tablet taken ONCE A DAY with your evening meal.  DO NOT stop taking Xarelto without talking to the health care provider who prescribed the medication.  Refill your prescription for 20 mg tablets before you run out.  After discharge, you should have regular check-up appointments with your healthcare provider that is prescribing your Xarelto.  In the future your dose may need to be changed if your kidney function changes by a significant amount.  What do you do if you miss a dose? If you are taking Xarelto TWICE DAILY and you miss a dose, take it as soon as you remember. You may take two 15 mg tablets (total 30 mg) at the same time then resume your regularly scheduled 15 mg twice daily the next day.  If you are taking Xarelto ONCE DAILY and you miss a dose, take it as soon as you remember on the same day then continue your regularly scheduled once daily regimen the next day. Do not take two doses of Xarelto at the same time.   Important Safety Information Xarelto is a blood thinner medicine that can cause bleeding. You should call your healthcare provider right away if you experience any of the following: Bleeding from an injury or your nose that does not stop. Unusual colored urine (red or dark brown) or unusual colored stools (red or black). Unusual bruising for unknown reasons. A serious fall or  if you hit your head (even if there is no bleeding).  Some medicines may interact with Xarelto and might increase your risk of bleeding while on Xarelto. To help avoid this, consult your healthcare provider or pharmacist prior to using any new prescription or non-prescription medications, including herbals, vitamins, non-steroidal anti-inflammatory drugs (NSAIDs) and supplements.  This website has more information on Xarelto: VisitDestination.com.br.

## 2023-06-07 NOTE — Progress Notes (Signed)
PHARMACY - ANTICOAGULATION CONSULT NOTE  Pharmacy Consult for heparin>>Xarelto Indication: pulmonary embolus  No Known Allergies  Patient Measurements: Height: 5' (152.4 cm) Weight: 43.1 kg (95 lb) IBW/kg (Calculated) : 45.5 Heparin Dosing Weight: 43.1 kg  Vital Signs: Temp: 98.2 F (36.8 C) (02/16 0912) Temp Source: Oral (02/16 0912) BP: 137/72 (02/16 0912) Pulse Rate: 80 (02/16 0912)  Labs: Recent Labs    06/06/23 0658 06/06/23 0831 06/06/23 1055 06/06/23 1955 06/07/23 0727  HGB 8.1*  --   --   --  8.6*  HCT 25.4*  --   --   --  27.3*  PLT 343  --   --   --  367  HEPARINUNFRC  --   --   --  <0.10* 0.12*  CREATININE 0.68  --   --   --  0.73  TROPONINIHS  --  3 4  --   --     Estimated Creatinine Clearance: 35.6 mL/min (by C-G formula based on SCr of 0.73 mg/dL).   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  Pharmacy consulted to manage heparin for PE.  2/15 CT: Small volume acute occlusive and nonocclusive pulmonary embolism, no right heart strain or lung infarction. No anticoagulants PTA   06/07/2023  -Heparin level 0.12, subtherapeutic with heparin infusion at 800 units/hr -Hgb low but stable, plt WNL -No complications of therapy noted; RN reports no line issues To transition to Xarelto today  Goal of Therapy:  Heparin level 0.3-0.7 units/ml Monitor platelets by anticoagulation protocol: Yes   Plan:  DC heparin drip Xarelto 15 mg bid with meals x 21 days then Xarelto 20 mg with supper starting Sunday 06/28/23 at 1700 DC heparin levels & CBCs Will provide education & 30 day free card prior to discharge   Herby Abraham, Pharm.D Use secure chat for questions 06/07/2023 9:51 AM

## 2023-06-07 NOTE — Assessment & Plan Note (Signed)
-   Patient with adequate SpO2 on room air at rest but while ambulating dropped to 84% on room air.  She required 3 L of oxygen to maintain adequate saturations -Discussed with daughter and we will arrange for home O2

## 2023-06-07 NOTE — Assessment & Plan Note (Signed)
-   no s/s exac

## 2023-06-07 NOTE — Assessment & Plan Note (Signed)
-   Iron stores low, started on replacement at discharge

## 2023-06-08 ENCOUNTER — Other Ambulatory Visit (HOSPITAL_COMMUNITY): Payer: Self-pay

## 2023-06-08 ENCOUNTER — Telehealth (HOSPITAL_COMMUNITY): Payer: Self-pay | Admitting: Pharmacy Technician

## 2023-06-08 DIAGNOSIS — D509 Iron deficiency anemia, unspecified: Secondary | ICD-10-CM | POA: Insufficient documentation

## 2023-06-08 DIAGNOSIS — Z170 Estrogen receptor positive status [ER+]: Secondary | ICD-10-CM

## 2023-06-08 DIAGNOSIS — C50412 Malignant neoplasm of upper-outer quadrant of left female breast: Secondary | ICD-10-CM | POA: Diagnosis not present

## 2023-06-08 DIAGNOSIS — N3000 Acute cystitis without hematuria: Secondary | ICD-10-CM | POA: Insufficient documentation

## 2023-06-08 DIAGNOSIS — I2699 Other pulmonary embolism without acute cor pulmonale: Secondary | ICD-10-CM | POA: Diagnosis not present

## 2023-06-08 DIAGNOSIS — J9601 Acute respiratory failure with hypoxia: Secondary | ICD-10-CM | POA: Diagnosis not present

## 2023-06-08 MED ORDER — RIVAROXABAN (XARELTO) VTE STARTER PACK (15 & 20 MG)
ORAL_TABLET | ORAL | 0 refills | Status: DC
  Filled 2023-06-08: qty 51, 30d supply, fill #0

## 2023-06-08 MED ORDER — RIVAROXABAN 20 MG PO TABS: 20.0000 mg | ORAL_TABLET | Freq: Every day | ORAL | 1 refills | Status: DC

## 2023-06-08 MED ORDER — CEFADROXIL 500 MG PO CAPS
500.0000 mg | ORAL_CAPSULE | Freq: Two times a day (BID) | ORAL | 0 refills | Status: DC
  Filled 2023-06-08: qty 8, 4d supply, fill #0

## 2023-06-08 MED ORDER — FERROUS SULFATE 325 (65 FE) MG PO TABS: 325.0000 mg | ORAL_TABLET | Freq: Every day | ORAL | Status: DC

## 2023-06-08 MED ORDER — CEFADROXIL 500 MG PO CAPS
1000.0000 mg | ORAL_CAPSULE | Freq: Two times a day (BID) | ORAL | 0 refills | Status: DC
  Filled 2023-06-08: qty 16, 4d supply, fill #0

## 2023-06-08 MED ORDER — CEFADROXIL 500 MG PO CAPS
500.0000 mg | ORAL_CAPSULE | ORAL | Status: AC
  Administered 2023-06-08: 500 mg via ORAL
  Filled 2023-06-08: qty 1

## 2023-06-08 MED ORDER — SODIUM CHLORIDE 0.9 % IV SOLN: 1.0000 g | INTRAVENOUS | Status: DC

## 2023-06-08 MED ORDER — FERROUS SULFATE 325 (65 FE) MG PO TABS
325.0000 mg | ORAL_TABLET | Freq: Every day | ORAL | Status: DC
  Administered 2023-06-08: 325 mg via ORAL
  Filled 2023-06-08: qty 1

## 2023-06-08 NOTE — Assessment & Plan Note (Signed)
Continue oral iron daily

## 2023-06-08 NOTE — Telephone Encounter (Addendum)
Patient Product/process development scientist completed.    The patient is insured through [employer]. Patient has Medicare and is not eligible for a copay card, but may be able to apply for patient assistance or Medicare RX Payment Plan (Patient Must reach out to their plan, if eligible for payment plan), if available.    Ran test claim for Xarelto 20 mg and the current 30 day co-pay is $222.98 due to a $250.00 deductible.  Will be $47.00 once deductible is met.  Ran test claim for Eliquis 5 mg and the current 30 day co-pay is $222.98 due to a $250.00 deductible.  Will be $47.00 once deductible is met.  This test claim was processed through [facility]- copay amounts may vary at other pharmacies due to pharmacy/plan contracts, or as the patient moves through the different stages of their insurance plan.     Roland Earl, CPHT Pharmacy Technician III Certified Patient Advocate [facility] Patient Advocate Team Direct Number: [phone]  Fax: [phone]

## 2023-06-08 NOTE — Assessment & Plan Note (Signed)
-   Patient complaining of dysuria symptoms on admission -Recent urine cultures from 05/14/2023 and 05/18/2023 were unremarkable -UA on admission noted with large LE, negative nitrite, greater than 50 WBC - Patient continued on empiric course of cefadroxil at discharge

## 2023-06-08 NOTE — Discharge Summary (Signed)
Physician Discharge Summary   Nicole Rush ZOX:[id number] DOB: 1941/01/10 DOA: 06/06/2023  PCP: Leilani Able, MD  Admit date: 06/06/2023 Discharge date: 06/08/2023  Admitted From: Home Disposition:  Home Discharging physician: Lewie Chamber, MD Barriers to discharge: none  Recommendations at discharge: Mild increase in mediastinal and hilar lymphadenopathy, consider repeat CT chest in 3 to 6 months Wean from oxygen Continue Xarelto for 3 to 6 months, recommended for 6 months at discharge  Home Health: n/a Equipment/Devices: Home O2  Discharge Condition: stable CODE STATUS: Full  Diet recommendation:  Diet Orders (From admission, onward)     Start     Ordered   06/08/23 0000  Diet general        06/08/23 1033   06/06/23 1038  Diet regular Room service appropriate? Yes; Fluid consistency: Thin  Diet effective now       Question Answer Comment  Room service appropriate? Yes   Fluid consistency: Thin      06/06/23 1038            Hospital Course: Nicole Rush is an 83 yo female with PMH HTN, CVA, left breast cancer who presented with shortness of breath and pleuritic chest pain. Patient was a poor historian on admission. CT angio chest was performed and positive for PE noted in the left lower lobe and right upper lobe.  Lower extremity duplex negative for DVT.  Echo negative for right heart strain.  She was started on a heparin drip and admitted for further evaluation.  Assessment and Plan: * Acute pulmonary embolism (HCC) - Very immobile per patient and suspect provoked PE in setting of decreased mobility -Lower extremity duplex negative for DVT - Echo with preserved EF, greater than 75%, grade 1 diastolic dysfunction, no right heart strain - Has been on heparin -Discussed with her daughter who prefers Xarelto; patient still has deductible to meet; discussed pricing with daughter prior to discharge.  Once deductible met, prescription will be $47 per month per  pharmacy -Given bilateral PE and immobility with recent surgeries, recommend to continue for 6 months on treatment; discussed with daughter  Acute respiratory failure with hypoxia (HCC) - Patient with adequate SpO2 on room air at rest but while ambulating dropped to 84% on room air.  She required 3 L of oxygen to maintain adequate saturations -Discussed with daughter and we will arrange for home O2  Malignant neoplasm of upper-outer quadrant of left breast in female, estrogen receptor positive (HCC) - treated with lumpectomy in 2020, and anastrozole. Patient states she is no longer on anastrozole; CTA chest notes mild interval increase in size of pre-existing mediastinal and hilar lymphadenopathy.  Given history of underlying cancer, may need repeat imaging in 3 to 6 months  Acute cystitis - Patient complaining of dysuria symptoms on admission -Recent urine cultures from 05/14/2023 and 05/18/2023 were unremarkable -UA on admission noted with large LE, negative nitrite, greater than 50 WBC - Patient continued on empiric course of cefadroxil at discharge  IDA (iron deficiency anemia) - Continue oral iron daily  COPD with chronic bronchitis and emphysema (HCC) - no s/s exac  Microcytic hypochromic anemia - Iron stores low, started on replacement at discharge  Essential hypertension - No home meds noted   The patient's acute and chronic medical conditions were treated accordingly. On day of discharge, patient was felt deemed stable for discharge. Patient/family member advised to call PCP or come back to ER if needed.   Principal Diagnosis: Acute pulmonary embolism (HCC)  Discharge Diagnoses: Active Hospital Problems   Diagnosis Date Noted   Acute pulmonary embolism (HCC) 06/06/2023    Priority: 1.   Acute respiratory failure with hypoxia (HCC) 06/07/2023    Priority: 2.   Malignant neoplasm of upper-outer quadrant of left breast in female, estrogen receptor positive (HCC) 03/22/2019     Priority: 3.   IDA (iron deficiency anemia) 06/08/2023   Acute cystitis 06/08/2023   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   Microcytic hypochromic anemia 04/17/2021   Essential hypertension 03/22/2021    Resolved Hospital Problems  No resolved problems to display.     Discharge Instructions     Diet general   Complete by: As directed    Increase activity slowly   Complete by: As directed       Allergies as of 06/08/2023   No Known Allergies      Medication List     STOP taking these medications    cephALEXin 500 MG capsule Commonly known as: KEFLEX       TAKE these medications    acetaZOLAMIDE 250 MG tablet Commonly known as: DIAMOX Take 1 tablet by mouth 2 (two) times daily.   albuterol 108 (90 Base) MCG/ACT inhaler Commonly known as: VENTOLIN HFA SMARTSIG:1 Puff(s) By Mouth Every 4-6 Hours PRN   brimonidine 0.2 % ophthalmic solution Commonly known as: ALPHAGAN Apply to eye.   cefadroxil 500 MG capsule Commonly known as: DURICEF Take 1 capsule (500 mg total) by mouth 2 (two) times daily for 4 days. Start taking on: June 09, 2023   dorzolamide-timolol 2-0.5 % ophthalmic solution Commonly known as: COSOPT Apply to eye.   ferrous sulfate 325 (65 FE) MG tablet Take 1 tablet (325 mg total) by mouth daily with breakfast.   gabapentin 400 MG capsule Commonly known as: NEURONTIN Take 400 mg by mouth 3 (three) times daily.   latanoprost 0.005 % ophthalmic solution Commonly known as: XALATAN Apply to eye.   magnesium oxide 400 MG tablet Commonly known as: MAG-OX Take 1 tablet by mouth at bedtime.   prednisoLONE acetate 1 % ophthalmic suspension Commonly known as: PRED FORTE 1 drop 3 (three) times daily.   Xarelto Starter Pack Generic drug: Rivaroxaban Starter Pack (15 mg and 20 mg) Follow package directions: Take one 15mg  tablet by mouth twice a day for 21 days.  On day 22, switch to one 20mg  tablet once a day. Take  with food.   rivaroxaban 20 MG Tabs tablet Commonly known as: XARELTO Take 1 tablet (20 mg total) by mouth daily with supper. Start taking after completing the starter pack Start taking on: July 06, 2023               Durable Medical Equipment  (From admission, onward)           Start     Ordered   06/07/23 [phone]  For home use only DME oxygen  Once       Question Answer Comment  Length of Need Lifetime   Mode or (Route) Nasal cannula   Liters per Minute 3   Frequency Continuous (stationary and portable oxygen unit needed)   Oxygen conserving device Yes   Oxygen delivery system Gas      06/07/23 0941            Follow-up Information     [facility] Follow up.   Why: home Oxygen Contact information: [address] [place] [address] [phone]  No Known Allergies  Consultations:   Procedures:   Discharge Exam: BP 122/73 (BP Location: Left Arm)   Pulse 79   Temp 98.3 F (36.8 C) (Oral)   Resp (!) 23   Ht 5' (1.524 m)   Wt 43.1 kg   SpO2 97%   BMI 18.55 kg/m  Physical Exam Constitutional:      Appearance: Normal appearance.  HENT:     Head: Normocephalic and atraumatic.     Mouth/Throat:     Mouth: Mucous membranes are moist.  Eyes:     Extraocular Movements: Extraocular movements intact.  Cardiovascular:     Rate and Rhythm: Normal rate and regular rhythm.  Pulmonary:     Effort: Pulmonary effort is normal. No respiratory distress.     Breath sounds: No wheezing.     Comments: Coarse breath sounds bilaterally Abdominal:     General: Bowel sounds are normal. There is no distension.     Palpations: Abdomen is soft.     Tenderness: There is no abdominal tenderness.  Musculoskeletal:        General: Normal range of motion.     Cervical back: Normal range of motion and neck supple.  Skin:    General: Skin is warm and dry.  Neurological:     General: No focal deficit present.      Mental Status: She is alert.  Psychiatric:        Mood and Affect: Mood normal.      The results of significant diagnostics from this hospitalization (including imaging, microbiology, ancillary and laboratory) are listed below for reference.   Microbiology: Recent Results (from the past 240 hours)  Resp panel by RT-PCR (RSV, Flu A&B, Covid) Anterior Nasal Swab     Status: None   Collection Time: 06/06/23  7:33 AM   Specimen: Anterior Nasal Swab  Result Value Ref Range Status   SARS Coronavirus 2 by RT PCR NEGATIVE NEGATIVE Final    Comment: (NOTE) SARS-CoV-2 target nucleic acids are NOT DETECTED.  The SARS-CoV-2 RNA is generally detectable in upper respiratory specimens during the acute phase of infection. The lowest concentration of SARS-CoV-2 viral copies this assay can detect is 138 copies/mL. A negative result does not preclude SARS-Cov-2 infection and should not be used as the sole basis for treatment or other patient management decisions. A negative result may occur with  improper specimen collection/handling, submission of specimen other than nasopharyngeal swab, presence of viral mutation(s) within the areas targeted by this assay, and inadequate number of viral copies(<138 copies/mL). A negative result must be combined with clinical observations, patient history, and epidemiological information. The expected result is Negative.  Fact Sheet for Patients:  BloggerCourse.com  Fact Sheet for Healthcare Providers:  SeriousBroker.it  This test is no t yet approved or cleared by the [place] FDA and  has been authorized for detection and/or diagnosis of SARS-CoV-2 by FDA under an Emergency Use Authorization (EUA). This EUA will remain  in effect (meaning this test can be used) for the duration of the COVID-19 declaration under Section 564(b)(1) of the Act, 21 U.S.C.section 360bbb-3(b)(1), unless the authorization is  terminated  or revoked sooner.       Influenza A by PCR NEGATIVE NEGATIVE Final   Influenza B by PCR NEGATIVE NEGATIVE Final    Comment: (NOTE) The Xpert Xpress SARS-CoV-2/FLU/RSV plus assay is intended as an aid in the diagnosis of influenza from Nasopharyngeal swab specimens and should not be used as a sole basis  for treatment. Nasal washings and aspirates are unacceptable for Xpert Xpress SARS-CoV-2/FLU/RSV testing.  Fact Sheet for Patients: BloggerCourse.com  Fact Sheet for Healthcare Providers: SeriousBroker.it  This test is not yet approved or cleared by the [place] FDA and has been authorized for detection and/or diagnosis of SARS-CoV-2 by FDA under an Emergency Use Authorization (EUA). This EUA will remain in effect (meaning this test can be used) for the duration of the COVID-19 declaration under Section 564(b)(1) of the Act, 21 U.S.C. section 360bbb-3(b)(1), unless the authorization is terminated or revoked.     Resp Syncytial Virus by PCR NEGATIVE NEGATIVE Final    Comment: (NOTE) Fact Sheet for Patients: BloggerCourse.com  Fact Sheet for Healthcare Providers: SeriousBroker.it  This test is not yet approved or cleared by the [place] FDA and has been authorized for detection and/or diagnosis of SARS-CoV-2 by FDA under an Emergency Use Authorization (EUA). This EUA will remain in effect (meaning this test can be used) for the duration of the COVID-19 declaration under Section 564(b)(1) of the Act, 21 U.S.C. section 360bbb-3(b)(1), unless the authorization is terminated or revoked.  Performed at [facility], [address], [place], [place] [address]      Labs: BNP (last 3 results) Recent Labs    06/06/23 0658  BNP 111.4*   Basic Metabolic Panel: Recent Labs  Lab 06/06/23 0658 06/06/23 0831 06/07/23 0727  NA 138  --   138  K 3.1*  --  4.1  CL 104  --  104  CO2 24  --  24  GLUCOSE 91  --  90  BUN 16  --  14  CREATININE 0.68  --  0.73  CALCIUM 8.5*  --  8.5*  MG  --  1.8  --    Liver Function Tests: No results for input(s): "AST", "ALT", "ALKPHOS", "BILITOT", "PROT", "ALBUMIN" in the last 168 hours. No results for input(s): "LIPASE", "AMYLASE" in the last 168 hours. No results for input(s): "AMMONIA" in the last 168 hours. CBC: Recent Labs  Lab 06/06/23 0658 06/07/23 0727  WBC 9.4 9.7  HGB 8.1* 8.6*  HCT 25.4* 27.3*  MCV 75.8* 76.9*  PLT 343 367   Cardiac Enzymes: No results for input(s): "CKTOTAL", "CKMB", "CKMBINDEX", "TROPONINI" in the last 168 hours. BNP: Invalid input(s): "POCBNP" CBG: No results for input(s): "GLUCAP" in the last 168 hours. D-Dimer No results for input(s): "DDIMER" in the last 72 hours. Hgb A1c No results for input(s): "HGBA1C" in the last 72 hours. Lipid Profile No results for input(s): "CHOL", "HDL", "LDLCALC", "TRIG", "CHOLHDL", "LDLDIRECT" in the last 72 hours. Thyroid function studies No results for input(s): "TSH", "T4TOTAL", "T3FREE", "THYROIDAB" in the last 72 hours.  Invalid input(s): "FREET3" Anemia work up Recent Labs    06/07/23 0727  FERRITIN 143  TIBC 248*  IRON 18*   Urinalysis    Component Value Date/Time   COLORURINE YELLOW 06/07/2023 0718   APPEARANCEUR HAZY (A) 06/07/2023 0718   LABSPEC 1.020 06/07/2023 0718   PHURINE 5.0 06/07/2023 0718   GLUCOSEU NEGATIVE 06/07/2023 0718   HGBUR LARGE (A) 06/07/2023 0718   BILIRUBINUR NEGATIVE 06/07/2023 0718   KETONESUR NEGATIVE 06/07/2023 0718   PROTEINUR 30 (A) 06/07/2023 0718   NITRITE NEGATIVE 06/07/2023 0718   LEUKOCYTESUR LARGE (A) 06/07/2023 0718   Sepsis Labs Recent Labs  Lab 06/06/23 0658 06/07/23 0727  WBC 9.4 9.7   Microbiology Recent Results (from the past 240 hours)  Resp panel by RT-PCR (RSV, Flu A&B,  Covid) Anterior Nasal Swab     Status: None   Collection Time:  06/06/23  7:33 AM   Specimen: Anterior Nasal Swab  Result Value Ref Range Status   SARS Coronavirus 2 by RT PCR NEGATIVE NEGATIVE Final    Comment: (NOTE) SARS-CoV-2 target nucleic acids are NOT DETECTED.  The SARS-CoV-2 RNA is generally detectable in upper respiratory specimens during the acute phase of infection. The lowest concentration of SARS-CoV-2 viral copies this assay can detect is 138 copies/mL. A negative result does not preclude SARS-Cov-2 infection and should not be used as the sole basis for treatment or other patient management decisions. A negative result may occur with  improper specimen collection/handling, submission of specimen other than nasopharyngeal swab, presence of viral mutation(s) within the areas targeted by this assay, and inadequate number of viral copies(<138 copies/mL). A negative result must be combined with clinical observations, patient history, and epidemiological information. The expected result is Negative.  Fact Sheet for Patients:  BloggerCourse.com  Fact Sheet for Healthcare Providers:  SeriousBroker.it  This test is no t yet approved or cleared by the [place] FDA and  has been authorized for detection and/or diagnosis of SARS-CoV-2 by FDA under an Emergency Use Authorization (EUA). This EUA will remain  in effect (meaning this test can be used) for the duration of the COVID-19 declaration under Section 564(b)(1) of the Act, 21 U.S.C.section 360bbb-3(b)(1), unless the authorization is terminated  or revoked sooner.       Influenza A by PCR NEGATIVE NEGATIVE Final   Influenza B by PCR NEGATIVE NEGATIVE Final    Comment: (NOTE) The Xpert Xpress SARS-CoV-2/FLU/RSV plus assay is intended as an aid in the diagnosis of influenza from Nasopharyngeal swab specimens and should not be used as a sole basis for treatment. Nasal washings and aspirates are unacceptable for Xpert Xpress  SARS-CoV-2/FLU/RSV testing.  Fact Sheet for Patients: BloggerCourse.com  Fact Sheet for Healthcare Providers: SeriousBroker.it  This test is not yet approved or cleared by the [place] FDA and has been authorized for detection and/or diagnosis of SARS-CoV-2 by FDA under an Emergency Use Authorization (EUA). This EUA will remain in effect (meaning this test can be used) for the duration of the COVID-19 declaration under Section 564(b)(1) of the Act, 21 U.S.C. section 360bbb-3(b)(1), unless the authorization is terminated or revoked.     Resp Syncytial Virus by PCR NEGATIVE NEGATIVE Final    Comment: (NOTE) Fact Sheet for Patients: BloggerCourse.com  Fact Sheet for Healthcare Providers: SeriousBroker.it  This test is not yet approved or cleared by the [place] FDA and has been authorized for detection and/or diagnosis of SARS-CoV-2 by FDA under an Emergency Use Authorization (EUA). This EUA will remain in effect (meaning this test can be used) for the duration of the COVID-19 declaration under Section 564(b)(1) of the Act, 21 U.S.C. section 360bbb-3(b)(1), unless the authorization is terminated or revoked.  Performed at [facility], [address], [place], [place] [address]     Procedures/Studies: VAS [place] LOWER EXTREMITY VENOUS (DVT) Result Date: 06/06/2023  Lower Venous DVT Study Patient Name:  EAVAN GONTERMAN  Date of Exam:   06/06/2023 Medical Rec #: [id number]           Accession #:    [id number] Date of Birth: 03-Nov-1940            Patient Gender: F Patient Age:   33 years Exam Location:  [facility] Procedure:  VAS [place] LOWER EXTREMITY VENOUS (DVT) Referring Phys: MIR [facility] --------------------------------------------------------------------------------  Indications: Pulmonary embolism.  Risk Factors: Hx of breast cancer.  Comparison Study: Previous exam (RLEV) on 03/13/2013 was negative for DVT. Performing Technologist: Ernestene Mention RVT, RDMS  Examination Guidelines: A complete evaluation includes B-mode imaging, spectral Doppler, color Doppler, and power Doppler as needed of all accessible portions of each vessel. Bilateral testing is considered an integral part of a complete examination. Limited examinations for reoccurring indications may be performed as noted. The reflux portion of the exam is performed with the patient in reverse Trendelenburg.  +---------+---------------+---------+-----------+----------+--------------+ RIGHT    CompressibilityPhasicitySpontaneityPropertiesThrombus Aging +---------+---------------+---------+-----------+----------+--------------+ CFV      Full           Yes      Yes                                 +---------+---------------+---------+-----------+----------+--------------+ SFJ      Full                                                        +---------+---------------+---------+-----------+----------+--------------+ FV Prox  Full           Yes      Yes                                 +---------+---------------+---------+-----------+----------+--------------+ FV Mid   Full           Yes      Yes                                 +---------+---------------+---------+-----------+----------+--------------+ FV DistalFull           Yes      Yes                                 +---------+---------------+---------+-----------+----------+--------------+ PFV      Full                                                        +---------+---------------+---------+-----------+----------+--------------+ POP      Full           Yes      Yes                                 +---------+---------------+---------+-----------+----------+--------------+ PTV      Full                                                         +---------+---------------+---------+-----------+----------+--------------+ PERO     Full                                                        +---------+---------------+---------+-----------+----------+--------------+   +---------+---------------+---------+-----------+----------+--------------+  LEFT     CompressibilityPhasicitySpontaneityPropertiesThrombus Aging +---------+---------------+---------+-----------+----------+--------------+ CFV      Full           Yes      Yes                                 +---------+---------------+---------+-----------+----------+--------------+ SFJ      Full                                                        +---------+---------------+---------+-----------+----------+--------------+ FV Prox  Full           Yes      Yes                                 +---------+---------------+---------+-----------+----------+--------------+ FV Mid   Full           Yes      Yes                                 +---------+---------------+---------+-----------+----------+--------------+ FV DistalFull           Yes      Yes                                 +---------+---------------+---------+-----------+----------+--------------+ PFV      Full                                                        +---------+---------------+---------+-----------+----------+--------------+ POP      Full           Yes      Yes                                 +---------+---------------+---------+-----------+----------+--------------+ PTV      Full                                                        +---------+---------------+---------+-----------+----------+--------------+ PERO     Full                                                        +---------+---------------+---------+-----------+----------+--------------+     Summary: BILATERAL: - No evidence of deep vein thrombosis seen in the lower extremities, bilaterally. -No evidence of  popliteal cyst, bilaterally.   *See table(s) above for measurements and observations. Electronically signed by Carolynn Sayers on 06/06/2023 at 8:18:30 PM.    Final    ECHOCARDIOGRAM COMPLETE Result Date: 06/06/2023    ECHOCARDIOGRAM REPORT   Patient Name:  Aritza Elwin Mocha Date of Exam: 06/06/2023 Medical Rec #:  [id number]          Height:       60.0 in Accession #:    [id number]         Weight:       95.0 lb Date of Birth:  27-Oct-1940           BSA:          1.360 m Patient Age:    82 years           BP:           147/107 mmHg Patient Gender: F                  HR:           86 bpm. Exam Location:  Inpatient Procedure: 2D Echo, 3D Echo, Cardiac Doppler, Color Doppler and Strain Analysis            (Both Spectral and Color Flow Doppler were utilized during            procedure). Indications:    Pulmonary embolus  History:        Patient has prior history of Echocardiogram examinations, most                 recent 04/17/2021. COPD, Signs/Symptoms:Syncope and Chest Pain;                 Risk Factors:Hypertension and Dyslipidemia. Pericarditis,                 Cardiac tamponade.  Sonographer:    Vern Claude Referring Phys: [id number] MIR [facility] IMPRESSIONS  1. Left ventricular ejection fraction, by estimation, is >75%. The left ventricle has hyperdynamic function. The left ventricle has no regional wall motion abnormalities. Left ventricular diastolic parameters are consistent with Grade I diastolic dysfunction (impaired relaxation). The average left ventricular global longitudinal strain is -18.1 %. The global longitudinal strain is normal.  2. Right ventricular systolic function is normal. The right ventricular size is normal. There is moderately elevated pulmonary artery systolic pressure.  3. Left atrial size was mildly dilated.  4. The mitral valve is normal in structure. No evidence of mitral valve regurgitation. No evidence of mitral stenosis.  5. The tricuspid valve is abnormal.  6. The aortic valve is  tricuspid. There is mild calcification of the aortic valve. There is mild thickening of the aortic valve. Aortic valve regurgitation is not visualized. No aortic stenosis is present.  7. The inferior vena cava is normal in size with greater than 50% respiratory variability, suggesting right atrial pressure of 3 mmHg. FINDINGS  Left Ventricle: Left ventricular ejection fraction, by estimation, is >75%. The left ventricle has hyperdynamic function. The left ventricle has no regional wall motion abnormalities. The average left ventricular global longitudinal strain is -18.1 %. Strain was performed and the global longitudinal strain is normal. The left ventricular internal cavity size was normal in size. There is no left ventricular hypertrophy. Left ventricular diastolic parameters are consistent with Grade I diastolic dysfunction (impaired relaxation). Normal left ventricular filling pressure. Right Ventricle: The right ventricular size is normal. Right vetricular wall thickness was not well visualized. Right ventricular systolic function is normal. There is moderately elevated pulmonary artery systolic pressure. The tricuspid regurgitant velocity is 3.45 m/s, and with an assumed right atrial pressure of 3 mmHg, the estimated right ventricular systolic pressure is 50.6 mmHg. Left Atrium: Left atrial  size was mildly dilated. Right Atrium: Right atrial size was normal in size. Pericardium: There is no evidence of pericardial effusion. Mitral Valve: The mitral valve is normal in structure. No evidence of mitral valve regurgitation. No evidence of mitral valve stenosis. MV peak gradient, 3.1 mmHg. The mean mitral valve gradient is 2.0 mmHg. Tricuspid Valve: The tricuspid valve is abnormal. Tricuspid valve regurgitation is mild . No evidence of tricuspid stenosis. Aortic Valve: The aortic valve is tricuspid. There is mild calcification of the aortic valve. There is mild thickening of the aortic valve. There is mild aortic  valve annular calcification. Aortic valve regurgitation is not visualized. No aortic stenosis  is present. Aortic valve mean gradient measures 4.0 mmHg. Aortic valve peak gradient measures 8.8 mmHg. Aortic valve area, by VTI measures 2.75 cm. Pulmonic Valve: The pulmonic valve was not well visualized. Pulmonic valve regurgitation is not visualized. No evidence of pulmonic stenosis. Aorta: The aortic root and ascending aorta are structurally normal, with no evidence of dilitation. Venous: The inferior vena cava is normal in size with greater than 50% respiratory variability, suggesting right atrial pressure of 3 mmHg. IAS/Shunts: No atrial level shunt detected by color flow Doppler. Additional Comments: 3D was performed not requiring image post processing on an independent workstation and was normal.  LEFT VENTRICLE PLAX 2D LVIDd:         3.40 cm      Diastology LVIDs:         2.20 cm      LV e' medial:    6.64 cm/s LV PW:         0.80 cm      LV E/e' medial:  10.2 LV IVS:        1.00 cm      LV e' lateral:   7.29 cm/s LVOT diam:     1.90 cm      LV E/e' lateral: 9.3 LV SV:         62 LV SV Index:   45           2D Longitudinal Strain LVOT Area:     2.84 cm     2D Strain GLS Avg:     -18.1 %  LV Volumes (MOD) LV vol d, MOD A2C: 80.7 ml LV vol d, MOD A4C: 100.0 ml LV vol s, MOD A2C: 26.5 ml LV vol s, MOD A4C: 30.2 ml LV SV MOD A2C:     54.2 ml LV SV MOD A4C:     100.0 ml LV SV MOD BP:      61.0 ml RIGHT VENTRICLE             IVC RV Basal diam:  2.80 cm     IVC diam: 1.90 cm RV Mid diam:    1.70 cm RV S prime:     12.20 cm/s TAPSE (M-mode): 2.5 cm LEFT ATRIUM             Index        RIGHT ATRIUM           Index LA diam:        2.70 cm 1.98 cm/m   RA Area:     10.70 cm LA Vol (A2C):   44.0 ml 32.34 ml/m  RA Volume:   18.60 ml  13.67 ml/m LA Vol (A4C):   53.5 ml 39.33 ml/m LA Biplane Vol: 48.6 ml 35.73 ml/m  AORTIC VALVE  PULMONIC VALVE AV Area (Vmax):    2.61 cm     PV Vmax:       1.11 m/s AV  Area (Vmean):   2.37 cm     PV Peak grad:  4.9 mmHg AV Area (VTI):     2.75 cm AV Vmax:           148.00 cm/s AV Vmean:          92.700 cm/s AV VTI:            0.224 m AV Peak Grad:      8.8 mmHg AV Mean Grad:      4.0 mmHg LVOT Vmax:         136.00 cm/s LVOT Vmean:        77.400 cm/s LVOT VTI:          0.217 m LVOT/AV VTI ratio: 0.97  AORTA Ao Root diam: 2.90 cm Ao Asc diam:  3.00 cm MITRAL VALVE               TRICUSPID VALVE MV Area (PHT): 3.50 cm    TR Peak grad:   47.6 mmHg MV Area VTI:   2.87 cm    TR Vmax:        345.00 cm/s MV Peak grad:  3.1 mmHg MV Mean grad:  2.0 mmHg    SHUNTS MV Vmax:       0.88 m/s    Systemic VTI:  0.22 m MV Vmean:      62.5 cm/s   Systemic Diam: 1.90 cm MV Decel Time: 217 msec MV E velocity: 67.80 cm/s MV A velocity: 92.40 cm/s MV E/A ratio:  0.73 Dina Rich MD Electronically signed by Dina Rich MD Signature Date/Time: 06/06/2023/4:20:07 PM    Final    CT Angio Chest PE W and/or Wo Contrast Addendum Date: 06/06/2023 ADDENDUM REPORT: 06/06/2023 09:58 ADDENDUM: Critical Value/emergent results were called by telephone at the time of interpretation on 06/06/2023 at 9:52 am to provider Gloris Manchester , who verbally acknowledged these results. Electronically Signed   By: Jules Schick M.D.   On: 06/06/2023 09:58   Result Date: 06/06/2023 CLINICAL DATA:  Pulmonary embolism (PE) suspected, high prob. Shortness of breath. Cough and headache. EXAM: CT ANGIOGRAPHY CHEST WITH CONTRAST TECHNIQUE: Multidetector CT imaging of the chest was performed using the standard protocol during bolus administration of intravenous contrast. Multiplanar CT image reconstructions and MIPs were obtained to evaluate the vascular anatomy. RADIATION DOSE REDUCTION: This exam was performed according to the departmental dose-optimization program which includes automated exposure control, adjustment of the mA and/or kV according to patient size and/or use of iterative reconstruction technique. CONTRAST:   75mL OMNIPAQUE IOHEXOL 350 MG/ML SOLN COMPARISON:  CT scan chest from 06/17/2021. FINDINGS: Cardiovascular: Small volume acute pulmonary embolism noted. There is nonocclusive pulmonary embolism in the left lung lower lobe posterior segmental pulmonary artery branch with extension into the subsegmental pulmonary artery branches. There is also small volume occlusive pulmonary embolism in the right lung upper lobe segmental pulmonary artery branch and very small volume nonocclusive embolism in the distal subsegmental pulmonary branch to the right lung lower lobe posterior segment. No right heart strain or lung infarction. There is dilation of the main pulmonary trunk measuring up to 3.2 cm, which is nonspecific but can be seen with pulmonary artery hypertension. Normal cardiac size. No pericardial effusion. No aortic aneurysm. There are coronary artery calcifications, in keeping with coronary artery disease. There are also mild peripheral  atherosclerotic vascular calcifications of thoracic aorta and its major branches. Mediastinum/Nodes: Visualized thyroid gland appears grossly unremarkable. No solid / cystic mediastinal masses. The esophagus is nondistended precluding optimal assessment. There is soft tissue fullness in the mediastinum and bilateral hila, favored to represent enlarged lymph nodes which appears more pronounced than the prior exam however, favored benign/reactive. Correlate clinically to determine the need for follow-up imaging in 3-6 months to document resolution. No axillary lymphadenopathy by size criteria. Lungs/Pleura: The central tracheo-bronchial tree is patent. Upper lobe predominant moderate emphysematous changes noted. There are patchy areas of linear, plate-like atelectasis and/or scarring throughout bilateral lungs, with asymmetric more involvement of right lung lower lobe. No mass or consolidation. No pleural effusion or pneumothorax. No suspicious lung nodules. Upper Abdomen: Visualized  upper abdominal viscera within normal limits. Musculoskeletal: The visualized soft tissues of the chest wall are grossly unremarkable. No suspicious osseous lesions. There are mild multilevel degenerative changes in the visualized spine. Review of the MIP images confirms the above findings. IMPRESSION: 1. Small volume acute occlusive and nonocclusive pulmonary embolism, as described in detail above. No right heart strain or lung infarction. 2. Mild interval increase in the size of pre-existing mediastinal and hilar lymphadenopathy, favored benign/reactive. However, correlate clinically to determine the need for follow-up imaging in 3-6 months to document resolution. 3. There are patchy areas of scarring/atelectasis throughout bilateral lungs with asymmetric more involvement of right lung lower lobe. However, no lung mass, consolidation, pleural effusion or pneumothorax. 4. Multiple other nonacute observations, as described above. Aortic Atherosclerosis (ICD10-I70.0) and Emphysema (ICD10-J43.9). Electronically Signed: By: Jules Schick M.D. On: 06/06/2023 09:46   DG Chest Port 1 View Result Date: 06/06/2023 CLINICAL DATA:  Shortness of breath EXAM: PORTABLE CHEST 1 VIEW COMPARISON:  05/14/2023 FINDINGS: Normal heart size and mediastinal contours.  Aortic tortuosity. Streaky density at the right lung base compatible with scarring seen on chest CT 06/17/2021. There is no edema, consolidation, effusion, or pneumothorax. Postoperative left breast and axilla. IMPRESSION: Cardiomegaly without edema or focal pneumonia. Electronically Signed   By: Tiburcio Pea M.D.   On: 06/06/2023 07:31   CT Head Wo Contrast Result Date: 05/14/2023 CLINICAL DATA:  Head trauma, minor (Age >= 65y) syncope/struck head. Fall EXAM: CT HEAD WITHOUT CONTRAST TECHNIQUE: Contiguous axial images were obtained from the base of the skull through the vertex without intravenous contrast. RADIATION DOSE REDUCTION: This exam was performed  according to the departmental dose-optimization program which includes automated exposure control, adjustment of the mA and/or kV according to patient size and/or use of iterative reconstruction technique. COMPARISON:  03/05/2023 FINDINGS: Brain: Diffuse cerebral atrophy. No acute intracranial abnormality. Specifically, no hemorrhage, hydrocephalus, mass lesion, acute infarction, or significant intracranial injury. Vascular: No hyperdense vessel or unexpected calcification. Skull: No acute calvarial abnormality. Sinuses/Orbits: No acute findings Other: None IMPRESSION: No acute intracranial abnormality. Electronically Signed   By: Charlett Nose M.D.   On: 05/14/2023 01:37   DG Chest Port 1 View Result Date: 05/14/2023 CLINICAL DATA:  Syncopal episode, fell at home. EXAM: PORTABLE CHEST 1 VIEW COMPARISON:  HRCT 06/17/2021 FINDINGS: The cardiac size normal. Stable mediastinum with aortic tortuosity and atherosclerosis. Both lungs are slightly emphysematous but clear. The visualized skeletal structures are unremarkable. Surgical clips in the left axilla and chest wall are again shown. No new osseous findings. IMPRESSION: No evidence of acute chest disease. Emphysema. Aortic atherosclerosis. Electronically Signed   By: Almira Bar M.D.   On: 05/14/2023 00:35     Time coordinating discharge:  Over 30 minutes    Lewie Chamber, MD  [facility] 06/08/2023, 3:19 PM

## 2023-06-08 NOTE — TOC Transition Note (Signed)
Transition of Care [facility]) - Discharge Note   Patient Details  Name: Nicole Rush MRN: [id number] Date of Birth: 1940/07/14  [facility]) CM/SW Contact:  Larrie Kass, LCSW Phone Number: 06/08/2023, 12:14 PM   Clinical Narrative:     CSW met with the pt at bedside to discuss recommendations for home oxygen. Pt is agreeable and has no preference for a DME company. CSW sent a referral to [employer] for a portable tank to be delivered to the pt's room prior to discharge. The pt's daughter stated she will transport pt home at time of d/c . TOC sign off.   Final next level of care: Home/Self Care Barriers to Discharge: Barriers Resolved   Patient Goals and CMS Choice Patient states their goals for this hospitalization and ongoing recovery are:: rerun home CMS Medicare.gov Compare Post Acute Care list provided to:: Patient Choice offered to / list presented to : Patient      Discharge Placement                       Discharge Plan and Services Additional resources added to the After Visit Summary for                  DME Arranged: Oxygen DME Agency: [employer] Date DME Agency Contacted: 06/08/23 Time DME Agency Contacted: [id number]              Social Drivers of Health (SDOH) Interventions SDOH Screenings   Food Insecurity: No Food Insecurity (06/06/2023)  Housing: Low Risk  (06/06/2023)  Transportation Needs: No Transportation Needs (06/06/2023)  Utilities: Not At Risk (06/06/2023)  Social Connections: Unknown (06/06/2023)  Tobacco Use: Medium Risk (06/06/2023)     Readmission Risk Interventions     No data to display

## 2023-06-08 NOTE — Care Management Important Message (Signed)
Important Message  Patient Details IM Letter given. Name: Camiyah Friberg MRN: [id number] Date of Birth: [birth year]   Important Message Given:  Yes - Medicare IM     Caren Macadam 06/08/2023, 11:48 AM

## 2023-06-08 NOTE — Progress Notes (Signed)
Mobility Specialist - Progress Note  (3L [place]) Pre-mobility: 100% SpO2 During mobility: 97% SpO2 Post-mobility: 100% SPO2   06/08/23 0959  Mobility  Activity Ambulated with assistance in hallway  Level of Assistance Contact guard assist, steadying assist  Assistive Device Front wheel walker  Distance Ambulated (ft) 150 ft  Range of Motion/Exercises Active  Activity Response Tolerated well  Mobility Referral Yes  Mobility visit 1 Mobility  Mobility Specialist Start Time (ACUTE ONLY) 0930  Mobility Specialist Stop Time (ACUTE ONLY) 0959  Mobility Specialist Time Calculation (min) (ACUTE ONLY) 29 min   Pt was found in bed and agreeable to ambulate. No complaints with session. At EOS returned to recliner chair with all needs met. Call bell in reach and RN notified.  Billey Chang Mobility Specialist

## 2023-06-08 NOTE — Progress Notes (Signed)
AVS reviewed w/ pt's daughter, [name], via phone who verbalized an understanding. Pt's daughter will be here in 45 mins to one hr. She will call when she arrives & will pick pt up at front of hospital. PIV removed as noted. Pt dressed for d/c to home. - home w/ O2.

## 2023-06-11 ENCOUNTER — Emergency Department (HOSPITAL_COMMUNITY): Payer: Medicare HMO

## 2023-06-11 ENCOUNTER — Observation Stay (HOSPITAL_COMMUNITY)
Admission: EM | Admit: 2023-06-11 | Discharge: 2023-06-16 | Disposition: A | Discharge status: Home / Self Care | Payer: Medicare HMO | Attending: Internal Medicine | Admitting: Internal Medicine

## 2023-06-11 DIAGNOSIS — Z170 Estrogen receptor positive status [ER+]: Secondary | ICD-10-CM | POA: Diagnosis not present

## 2023-06-11 DIAGNOSIS — I10 Essential (primary) hypertension: Secondary | ICD-10-CM | POA: Diagnosis not present

## 2023-06-11 DIAGNOSIS — E785 Hyperlipidemia, unspecified: Secondary | ICD-10-CM | POA: Diagnosis not present

## 2023-06-11 DIAGNOSIS — C50412 Malignant neoplasm of upper-outer quadrant of left female breast: Secondary | ICD-10-CM | POA: Diagnosis not present

## 2023-06-11 DIAGNOSIS — Z87891 Personal history of nicotine dependence: Secondary | ICD-10-CM | POA: Insufficient documentation

## 2023-06-11 DIAGNOSIS — J9601 Acute respiratory failure with hypoxia: Principal | ICD-10-CM | POA: Diagnosis present

## 2023-06-11 DIAGNOSIS — Z8551 Personal history of malignant neoplasm of bladder: Secondary | ICD-10-CM | POA: Diagnosis not present

## 2023-06-11 DIAGNOSIS — F411 Generalized anxiety disorder: Secondary | ICD-10-CM | POA: Diagnosis not present

## 2023-06-11 DIAGNOSIS — D509 Iron deficiency anemia, unspecified: Secondary | ICD-10-CM | POA: Diagnosis not present

## 2023-06-11 DIAGNOSIS — K219 Gastro-esophageal reflux disease without esophagitis: Secondary | ICD-10-CM | POA: Diagnosis not present

## 2023-06-11 DIAGNOSIS — Z1152 Encounter for screening for COVID-19: Secondary | ICD-10-CM | POA: Insufficient documentation

## 2023-06-11 DIAGNOSIS — I2699 Other pulmonary embolism without acute cor pulmonale: Secondary | ICD-10-CM | POA: Diagnosis not present

## 2023-06-11 DIAGNOSIS — J4489 Other specified chronic obstructive pulmonary disease: Secondary | ICD-10-CM | POA: Diagnosis present

## 2023-06-11 DIAGNOSIS — J439 Emphysema, unspecified: Secondary | ICD-10-CM | POA: Diagnosis not present

## 2023-06-11 DIAGNOSIS — R599 Enlarged lymph nodes, unspecified: Secondary | ICD-10-CM

## 2023-06-11 DIAGNOSIS — E440 Moderate protein-calorie malnutrition: Secondary | ICD-10-CM | POA: Diagnosis not present

## 2023-06-11 DIAGNOSIS — Z8673 Personal history of transient ischemic attack (TIA), and cerebral infarction without residual deficits: Secondary | ICD-10-CM | POA: Insufficient documentation

## 2023-06-11 DIAGNOSIS — R0609 Other forms of dyspnea: Principal | ICD-10-CM

## 2023-06-11 DIAGNOSIS — Z7901 Long term (current) use of anticoagulants: Secondary | ICD-10-CM | POA: Insufficient documentation

## 2023-06-11 DIAGNOSIS — Z79899 Other long term (current) drug therapy: Secondary | ICD-10-CM | POA: Diagnosis not present

## 2023-06-11 DIAGNOSIS — R0602 Shortness of breath: Secondary | ICD-10-CM | POA: Diagnosis present

## 2023-06-11 LAB — URINALYSIS, ROUTINE W REFLEX MICROSCOPIC
Bacteria, UA: NONE SEEN
Bilirubin Urine: NEGATIVE
Glucose, UA: NEGATIVE mg/dL
Ketones, ur: NEGATIVE mg/dL
Nitrite: NEGATIVE
Protein, ur: 100 mg/dL — AB
RBC / HPF: >50 RBC/hpf (ref 0–5)
Specific Gravity, Urine: 1.02 (ref 1.005–1.030)
WBC, UA: >50 WBC/hpf (ref 0–5)
pH: 7 (ref 5.0–8.0)

## 2023-06-11 LAB — CBC
HCT: 25.1 % — ABNORMAL LOW (ref 36.0–46.0)
Hemoglobin: 7.9 g/dL — ABNORMAL LOW (ref 12.0–15.0)
MCH: 24.6 pg — ABNORMAL LOW (ref 26.0–34.0)
MCHC: 31.5 g/dL (ref 30.0–36.0)
MCV: 78.2 fL — ABNORMAL LOW (ref 80.0–100.0)
Platelets: 343 10*3/uL (ref 150–400)
RBC: 3.21 MIL/uL — ABNORMAL LOW (ref 3.87–5.11)
RDW: 19.8 % — ABNORMAL HIGH (ref 11.5–15.5)
WBC: 9.3 10*3/uL (ref 4.0–10.5)
nRBC: 0 % (ref 0.0–0.2)

## 2023-06-11 LAB — I-STAT CHEM 8, ED
BUN: 15 mg/dL (ref 8–23)
Calcium, Ion: 1.09 mmol/L — ABNORMAL LOW (ref 1.15–1.40)
Chloride: 104 mmol/L (ref 98–111)
Creatinine, Ser: 0.7 mg/dL (ref 0.44–1.00)
Glucose, Bld: 92 mg/dL (ref 70–99)
HCT: 23 % — ABNORMAL LOW (ref 36.0–46.0)
Hemoglobin: 7.8 g/dL — ABNORMAL LOW (ref 12.0–15.0)
Potassium: 3.6 mmol/L (ref 3.5–5.1)
Sodium: 139 mmol/L (ref 135–145)
TCO2: 25 mmol/L (ref 22–32)

## 2023-06-11 LAB — RESP PANEL BY RT-PCR (RSV, FLU A&B, COVID)  RVPGX2
Influenza A by PCR: NEGATIVE
Influenza B by PCR: NEGATIVE
Resp Syncytial Virus by PCR: NEGATIVE
SARS Coronavirus 2 by RT PCR: NEGATIVE

## 2023-06-11 LAB — TROPONIN I (HIGH SENSITIVITY): Troponin I (High Sensitivity): 4 ng/L (ref <18)

## 2023-06-11 LAB — PREPARE RBC (CROSSMATCH)

## 2023-06-11 LAB — POC OCCULT BLOOD, ED: Fecal Occult Bld: NEGATIVE

## 2023-06-11 LAB — BRAIN NATRIURETIC PEPTIDE: B Natriuretic Peptide: 90.9 pg/mL (ref 0.0–100.0)

## 2023-06-11 MED ORDER — IOHEXOL 350 MG/ML SOLN
75.0000 mL | Freq: Once | INTRAVENOUS | Status: AC | PRN
  Administered 2023-06-11: 75 mL via INTRAVENOUS

## 2023-06-11 MED ORDER — PREDNISOLONE ACETATE 1 % OP SUSP
1.0000 [drp] | Freq: Three times a day (TID) | OPHTHALMIC | Status: DC
  Administered 2023-06-12 – 2023-06-16 (×13): 1 [drp] via OPHTHALMIC
  Filled 2023-06-11: qty 5

## 2023-06-11 MED ORDER — SODIUM CHLORIDE 0.9% IV SOLUTION: Freq: Once | INTRAVENOUS | Status: AC

## 2023-06-11 NOTE — ED Triage Notes (Signed)
Per EMS from home. Pt recently hospitalized for PE. Sent home, started feeling SOB last night.   BP 156/98 HR 88 RR 30 O2 77 on RA

## 2023-06-11 NOTE — ED Provider Notes (Incomplete)
  Physical Exam  BP (!) 145/83   Pulse 87   Temp 98.2 F (36.8 C) (Oral)   Resp (!) 27   SpO2 96%   Physical Exam Vitals and nursing note reviewed.  HENT:     Head: Normocephalic and atraumatic.  Eyes:     Pupils: Pupils are equal, round, and reactive to light.  Cardiovascular:     Rate and Rhythm: Normal rate and regular rhythm.  Pulmonary:     Effort: Pulmonary effort is normal.     Breath sounds: Normal breath sounds.  Abdominal:     Palpations: Abdomen is soft.     Tenderness: There is no abdominal tenderness.  Skin:    General: Skin is warm and dry.  Neurological:     Mental Status: She is alert.  Psychiatric:        Mood and Affect: Mood normal.     Procedures  Procedures  ED Course / MDM    Medical Decision Making I, Estelle June DO, have assumed care of this patient from the previous provider pending remainder of labs workup, CTA chest and admission  Amount and/or Complexity of Data Reviewed Labs: ordered. Radiology: ordered.  Risk Prescription drug management. Decision regarding hospitalization.   ***

## 2023-06-11 NOTE — ED Provider Notes (Cosign Needed Addendum)
[place] EMERGENCY DEPARTMENT AT [facility] Provider Note   CSN: [id number] Arrival date & time: 06/11/23  1252     History  Chief Complaint  Patient presents with   Shortness of Breath    Nicole Rush is a 83 y.o. 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  They also report no chest pain but mild a headache.  They have been coughing since their hospitalization and have had diarrhea.  They are unsure if they have been coughing up blood, but they did have a significant nosebleed last night They also report a longstanding issue with their right eye, which has been causing pain and reduced vision for months. They have been receiving treatment for this issue through their eye doctor    Home Medications Prior to Admission medications   Medication Sig Start Date End Date Taking? Authorizing Provider  acetaZOLAMIDE (DIAMOX) 250 MG tablet Take 1 tablet by mouth 2 (two) times daily. 05/25/23   [provider]  albuterol (VENTOLIN HFA) 108 (90 Base) MCG/ACT inhaler SMARTSIG:1 Puff(s) By Mouth Every 4-6 Hours PRN 05/15/23   [provider]  brimonidine (ALPHAGAN) 0.2 % ophthalmic solution Apply to eye. 04/29/23   [provider]  cefadroxil (DURICEF) 500 MG capsule Take 1 capsule (500 mg total) by mouth 2 (two) times daily for 4 days. 06/09/23 06/13/23  Lewie Chamber, MD  dorzolamide-timolol (COSOPT) 2-0.5 % ophthalmic  solution Apply to eye. 04/29/23   [provider]  ferrous sulfate 325 (65 FE) MG tablet Take 1 tablet (325 mg total) by mouth daily with breakfast. 06/08/23   Lewie Chamber, MD  gabapentin (NEURONTIN) 400 MG capsule Take 400 mg by mouth 3 (three) times daily. 03/04/23   [provider]  latanoprost (XALATAN) 0.005 % ophthalmic solution Apply to eye. 04/29/23   [provider]  magnesium oxide (MAG-OX) 400 MG tablet Take 1 tablet by mouth at bedtime. 02/17/23   [provider]  prednisoLONE acetate (PRED FORTE) 1 % ophthalmic suspension 1 drop 3 (three) times daily.    [provider]  rivaroxaban (XARELTO) 20 MG TABS tablet Take 1 tablet (20 mg total) by mouth daily with supper. Start taking after completing the starter pack 07/06/23   Lewie Chamber, MD  RIVAROXABAN Carlena Hurl) VTE STARTER PACK (15 & 20 MG) Follow package directions: Take one 15mg  tablet by mouth twice a day for 21 days.  On day 22, switch to one 20mg  tablet once a day. Take with food. 06/08/23   Lewie Chamber, MD      Allergies    Patient has no known allergies.    Review of Systems   Review of Systems  Constitutional:  Negative for chills and fever.  HENT:  Negative for ear pain and sore throat.   Eyes:  Negative for pain and visual disturbance.  Respiratory:  Positive for cough and shortness of breath.   Cardiovascular:  Negative for chest pain and palpitations.  Gastrointestinal:  Negative for abdominal pain and vomiting.  Genitourinary:  Negative  for dysuria and hematuria.  Musculoskeletal:  Negative for arthralgias and back pain.  Skin:  Negative for color change and rash.  Neurological:  Negative for seizures and syncope.  All other systems reviewed and are negative.   Physical Exam Updated Vital Signs BP (!) 155/77   Pulse 88   Temp 98.4 F (36.9 C) (Oral)   Resp (!) 26   SpO2 95%  Physical Exam Vitals and nursing note reviewed.  Constitutional:      General: She  is not in acute distress.    Appearance: She is well-developed. She is ill-appearing.  HENT:     Head: Normocephalic and atraumatic.     Mouth/Throat:     Mouth: Mucous membranes are moist.  Eyes:     Conjunctiva/sclera: Conjunctivae normal.     Comments: Right eye conjunctival injection, tearing, mild tenderness to palpation, ROM intact, poor vision chronically in right eye per patient  Cardiovascular:     Rate and Rhythm: Normal rate and regular rhythm.     Heart sounds: No murmur heard. Pulmonary:     Effort: Pulmonary effort is normal. No respiratory distress.     Breath sounds: Normal breath sounds. No wheezing or rales.     Comments: Productive sounding cough, some poor air movement Abdominal:     Palpations: Abdomen is soft.     Tenderness: There is no abdominal tenderness.  Musculoskeletal:        General: No swelling.     Cervical back: Neck supple.  Skin:    General: Skin is warm and dry.     Capillary Refill: Capillary refill takes less than 2 seconds.  Neurological:     Mental Status: She is alert.  Psychiatric:        Mood and Affect: Mood normal.     ED Results / Procedures / Treatments   Labs (all labs ordered are listed, but only abnormal results are displayed) Labs Reviewed  CBC - Abnormal; Notable for the following components:      Result Value   RBC 3.21 (*)    Hemoglobin 7.9 (*)    HCT 25.1 (*)    MCV 78.2 (*)    MCH 24.6 (*)    RDW 19.8 (*)    All other components within normal limits  RESP PANEL BY RT-PCR (RSV, FLU A&B, COVID)  RVPGX2  BRAIN NATRIURETIC PEPTIDE  COMPREHENSIVE METABOLIC PANEL  OCCULT BLOOD X 1 CARD TO LAB, STOOL  POC OCCULT BLOOD, ED  TYPE AND SCREEN  PREPARE RBC (CROSSMATCH)  TYPE AND SCREEN  TROPONIN I (HIGH SENSITIVITY)  TROPONIN I (HIGH SENSITIVITY)    EKG EKG Interpretation Date/Time:  Thursday June 11 2023 13:17:22 EST Ventricular Rate:  90 PR Interval:  144 QRS Duration:  71 QT Interval:  355 QTC  Calculation: 435 R Axis:   75  Text Interpretation: Sinus rhythm Ventricular premature complex Confirmed by Lorre Nick ([id number]) on 06/11/2023 3:45:00 PM  Radiology DG Chest Port 1 View Result Date: 06/11/2023 CLINICAL DATA:  Shortness of breath.  Productive cough. EXAM: PORTABLE CHEST 1 VIEW COMPARISON:  Radiograph and CT 5 days ago 06/06/2023 FINDINGS: Stable heart size and mediastinal contours. Right hilar prominence corresponding to adenopathy on prior exam. Stable atelectasis/scarring in the periphery of the right lung base. There may be a trace right pleural effusion. No acute airspace disease. No pulmonary edema. No pneumothorax. Left axillary surgical clips. IMPRESSION: Stable atelectasis/scarring in the periphery of the right lung base. Possible trace right pleural  effusion. Electronically Signed   By: Narda Rutherford M.D.   On: 06/11/2023 15:05    Procedures Procedures   Medications Ordered in ED Medications  0.9 %  sodium chloride infusion (Manually program via Guardrails IV Fluids) (has no administration in time range)    ED Course/ Medical Decision Making/ A&P                                Medical Decision Making Amount and/or Complexity of Data Reviewed Labs: ordered. Radiology: ordered.  Risk Prescription drug management.   Medical Decision Making:   Nicole Rush is a 83 y.o. female who presented to the ED today with dyspnea detailed above.    External chart has been reviewed including recent hospitalization. Patient's presentation is complicated by their history of recent PE diagnosed 2/15.  Patient placed on continuous vitals and telemetry monitoring while in ED which was reviewed periodically.  Complete initial physical exam performed, notably the patient  was saturating well on 3 L [place] .    Reviewed and confirmed nursing documentation for past medical history, family history, social history.    Initial Assessment:   With the patient's presentation of  increased dyspnea, most likely diagnosis is known pulmonary embolism versus symptomatic anemia. Other diagnoses were considered including (but not limited to) MI, heart failure, viral infection. These are considered less likely due to history of present illness and physical exam findings.   This is most consistent with an acute life/limb threatening illness complicated by underlying chronic conditions.  Initial Plan:  Resp swab, BNP  Screening labs including CBC and Metabolic panel to evaluate for infectious or metabolic etiology of disease.  CXR to evaluate for structural/infectious intrathoracic pathology.  EKG and troponin to evaluate for cardiac pathology Objective evaluation as below reviewed   Initial Study Results:   Laboratory  All laboratory results reviewed without evidence of clinically relevant pathology.   Exceptions include: hgb 7.9 (previous of 8.6-8.1 4 days ago)  EKG EKG was reviewed independently. Rate, rhythm, axis, intervals all examined and without medically relevant abnormality. ST segments without concerns for elevations. Intermittent PVCs  Radiology:  All images reviewed independently. Agree with radiology report at this time.   DG Chest Port 1 View Result Date: 06/11/2023 CLINICAL DATA:  Shortness of breath.  Productive cough. EXAM: PORTABLE CHEST 1 VIEW COMPARISON:  Radiograph and CT 5 days ago 06/06/2023 FINDINGS: Stable heart size and mediastinal contours. Right hilar prominence corresponding to adenopathy on prior exam. Stable atelectasis/scarring in the periphery of the right lung base. There may be a trace right pleural effusion. No acute airspace disease. No pulmonary edema. No pneumothorax. Left axillary surgical clips. IMPRESSION: Stable atelectasis/scarring in the periphery of the right lung base. Possible trace right pleural effusion. Electronically Signed   By: Narda Rutherford M.D.   On: 06/11/2023 15:05   Final Assessment and Plan:   83 year old here  after recent admission for pulmonary embolism.  At that time had no right heart strain.  Troponin here pending.  Respiratory panel is negative, BNP low unlikely heart failure.  Does have drop in hemoglobin from 8.6-7.9.  Did have large nosebleed yesterday which could be contributing.  No active bleeding today and FOBT negative (RN chaperone present during exam).  Could be acute on chronic anemia which could be contributing-given 1 unit blood here due to history of CAD on imaging and dyspneic. Significantly dyspneic with movement and requiring  around 4 L with movement here (was discharged on 3 L on 2/17) and cannot move much at home.  Seems to have not had any missed doses of Xarelto. Will get repeat CTPE after discussion with signing out to oncoming provider Dr. Janine Ores. Anticipate admission.   Clinical Impression:  1. Dyspnea on exertion      Data Unavailable     Final Clinical Impression(s) / ED Diagnoses Final diagnoses:  Dyspnea on exertion    Rx / DC Orders ED Discharge Orders     None         Levin Erp, MD 06/11/23 1705    Levin Erp, MD 06/11/23 1706    Lorre Nick, MD 06/12/23 [phone]

## 2023-06-11 NOTE — ED Provider Notes (Signed)
  Physical Exam  BP (!) 145/83   Pulse 87   Temp 98.2 F (36.8 C) (Oral)   Resp (!) 27   SpO2 96%   Physical Exam Vitals and nursing note reviewed.  HENT:     Head: Normocephalic and atraumatic.  Eyes:     Pupils: Pupils are equal, round, and reactive to light.  Cardiovascular:     Rate and Rhythm: Normal rate and regular rhythm.  Pulmonary:     Effort: Pulmonary effort is normal.     Breath sounds: Normal breath sounds.  Abdominal:     Palpations: Abdomen is soft.     Tenderness: There is no abdominal tenderness.  Skin:    General: Skin is warm and dry.  Neurological:     Mental Status: She is alert.  Psychiatric:        Mood and Affect: Mood normal.     Procedures  Procedures  ED Course / MDM   Clinical Course as of 06/12/23 0011  Thu Jun 11, 2023  2355 There was some delay in getting the CTA chest as her initial metabolic panel had hemolyzed and we did not have renal function.  CTAShowed stable or slightly improved pulmonary emboli in the lower lobes bilaterally and a new nonocclusive embolus in the right middle lobe as well as concern for mediastinal and hilar adenopathy which is suspicious for metastatic adenopathy versus lymphoma.  Patient stable on O2 nasal cannula.  Discussed with admitting hospitalist accept patient for admission [MP]    Clinical Course User Index [MP] Royanne Foots, DO   Medical Decision Making I, Estelle June DO, have assumed care of this patient from the previous provider pending remainder of labs workup, CTA chest and admission  Amount and/or Complexity of Data Reviewed Labs: ordered. Radiology: ordered.  Risk Prescription drug management. Decision regarding hospitalization.          Royanne Foots, DO 06/12/23 0011

## 2023-06-11 NOTE — ED Provider Notes (Signed)
I saw and evaluated the patient, reviewed the resident's note and I agree with the findings and plan.   83 year old female with history of PE presents with more shortness of breath.  Patient is on Xarelto and was discharged on 3 L of oxygen.  States she has increased dyspnea exertion.  Will check labs and chest x-ray and patient will likely require admission   Lorre Nick, MD 06/11/23 1343

## 2023-06-12 DIAGNOSIS — J9601 Acute respiratory failure with hypoxia: Secondary | ICD-10-CM | POA: Diagnosis not present

## 2023-06-12 LAB — COMPREHENSIVE METABOLIC PANEL
ALT: 12 U/L (ref 0–44)
AST: 15 U/L (ref 15–41)
Albumin: 2.5 g/dL — ABNORMAL LOW (ref 3.5–5.0)
Alkaline Phosphatase: 58 U/L (ref 38–126)
Anion gap: 11 (ref 5–15)
BUN: 13 mg/dL (ref 8–23)
CO2: 21 mmol/L — ABNORMAL LOW (ref 22–32)
Calcium: 7.5 mg/dL — ABNORMAL LOW (ref 8.9–10.3)
Chloride: 105 mmol/L (ref 98–111)
Creatinine, Ser: 0.57 mg/dL (ref 0.44–1.00)
GFR, Estimated: >60 mL/min (ref >60)
Glucose, Bld: 69 mg/dL — ABNORMAL LOW (ref 70–99)
Potassium: 3.5 mmol/L (ref 3.5–5.1)
Sodium: 137 mmol/L (ref 135–145)
Total Bilirubin: 0.7 mg/dL (ref 0.0–1.2)
Total Protein: 6 g/dL — ABNORMAL LOW (ref 6.5–8.1)

## 2023-06-12 LAB — CBC
HCT: 30.7 % — ABNORMAL LOW (ref 36.0–46.0)
Hemoglobin: 9.6 g/dL — ABNORMAL LOW (ref 12.0–15.0)
MCH: 25.2 pg — ABNORMAL LOW (ref 26.0–34.0)
MCHC: 31.3 g/dL (ref 30.0–36.0)
MCV: 80.6 fL (ref 80.0–100.0)
Platelets: 325 10*3/uL (ref 150–400)
RBC: 3.81 MIL/uL — ABNORMAL LOW (ref 3.87–5.11)
RDW: 19.9 % — ABNORMAL HIGH (ref 11.5–15.5)
WBC: 9.9 10*3/uL (ref 4.0–10.5)
nRBC: 0 % (ref 0.0–0.2)

## 2023-06-12 LAB — TYPE AND SCREEN
ABO/RH(D): A POS
Antibody Screen: NEGATIVE
Unit division: 0

## 2023-06-12 LAB — BPAM RBC
Blood Product Expiration Date: 202503212359
ISSUE DATE / TIME: 202502202029
Unit Type and Rh: 6200

## 2023-06-12 LAB — GLUCOSE, CAPILLARY: Glucose-Capillary: 116 mg/dL — ABNORMAL HIGH (ref 70–99)

## 2023-06-12 MED ORDER — RIVAROXABAN 15 MG PO TABS
15.0000 mg | ORAL_TABLET | Freq: Two times a day (BID) | ORAL | Status: DC
Start: 2023-06-12 — End: 2023-06-12

## 2023-06-12 MED ORDER — ONDANSETRON HCL 4 MG/2ML IJ SOLN
4.0000 mg | Freq: Four times a day (QID) | INTRAMUSCULAR | Status: DC | PRN
  Administered 2023-06-14: 4 mg via INTRAVENOUS
  Filled 2023-06-12: qty 2

## 2023-06-12 MED ORDER — RIVAROXABAN 15 MG PO TABS
15.0000 mg | ORAL_TABLET | Freq: Two times a day (BID) | ORAL | Status: DC
  Administered 2023-06-12 – 2023-06-16 (×9): 15 mg via ORAL
  Filled 2023-06-12 (×9): qty 1

## 2023-06-12 MED ORDER — ALBUTEROL SULFATE (2.5 MG/3ML) 0.083% IN NEBU
2.5000 mg | INHALATION_SOLUTION | RESPIRATORY_TRACT | Status: DC | PRN
  Administered 2023-06-12: 2.5 mg via RESPIRATORY_TRACT
  Filled 2023-06-12: qty 3

## 2023-06-12 MED ORDER — IPRATROPIUM BROMIDE 0.02 % IN SOLN
0.5000 mg | Freq: Four times a day (QID) | RESPIRATORY_TRACT | Status: DC
  Administered 2023-06-12: 0.5 mg via RESPIRATORY_TRACT
  Filled 2023-06-12: qty 2.5

## 2023-06-12 MED ORDER — IPRATROPIUM-ALBUTEROL 0.5-2.5 (3) MG/3ML IN SOLN
3.0000 mL | Freq: Four times a day (QID) | RESPIRATORY_TRACT | Status: DC
  Administered 2023-06-12 (×2): 3 mL via RESPIRATORY_TRACT
  Filled 2023-06-12 (×2): qty 3

## 2023-06-12 MED ORDER — MORPHINE SULFATE (PF) 2 MG/ML IV SOLN
2.0000 mg | INTRAVENOUS | Status: DC | PRN
  Administered 2023-06-12 – 2023-06-16 (×4): 2 mg via INTRAVENOUS
  Filled 2023-06-12 (×4): qty 1

## 2023-06-12 MED ORDER — SODIUM CHLORIDE 0.9 % IV SOLN
100.0000 mg | Freq: Two times a day (BID) | INTRAVENOUS | Status: DC
  Administered 2023-06-12 – 2023-06-13 (×3): 100 mg via INTRAVENOUS
  Filled 2023-06-12 (×4): qty 100

## 2023-06-12 MED ORDER — ACETAZOLAMIDE 250 MG PO TABS
250.0000 mg | ORAL_TABLET | Freq: Two times a day (BID) | ORAL | Status: DC
  Administered 2023-06-12 – 2023-06-16 (×9): 250 mg via ORAL
  Filled 2023-06-12 (×11): qty 1

## 2023-06-12 MED ORDER — MELATONIN 5 MG PO TABS
5.0000 mg | ORAL_TABLET | Freq: Every evening | ORAL | Status: AC | PRN
  Administered 2023-06-12 – 2023-06-13 (×3): 5 mg via ORAL
  Filled 2023-06-12 (×3): qty 1

## 2023-06-12 MED ORDER — ONDANSETRON HCL 4 MG PO TABS: 4.0000 mg | ORAL_TABLET | Freq: Four times a day (QID) | ORAL | Status: DC | PRN

## 2023-06-12 MED ORDER — RIVAROXABAN 20 MG PO TABS: 20.0000 mg | ORAL_TABLET | Freq: Every day | ORAL | Status: DC

## 2023-06-12 MED ORDER — ALBUTEROL SULFATE (2.5 MG/3ML) 0.083% IN NEBU
2.5000 mg | INHALATION_SOLUTION | Freq: Four times a day (QID) | RESPIRATORY_TRACT | Status: DC
  Administered 2023-06-12: 2.5 mg via RESPIRATORY_TRACT
  Filled 2023-06-12: qty 3

## 2023-06-12 MED ORDER — IPRATROPIUM-ALBUTEROL 0.5-2.5 (3) MG/3ML IN SOLN
3.0000 mL | Freq: Three times a day (TID) | RESPIRATORY_TRACT | Status: DC
  Administered 2023-06-12: 3 mL via RESPIRATORY_TRACT
  Filled 2023-06-12: qty 3

## 2023-06-12 MED ORDER — IPRATROPIUM-ALBUTEROL 0.5-2.5 (3) MG/3ML IN SOLN
3.0000 mL | Freq: Every day | RESPIRATORY_TRACT | Status: AC
Start: 2023-06-14 — End: ?

## 2023-06-12 NOTE — Progress Notes (Addendum)
Brief same day note:  Patient is 54 female with history of PE on Xarelto, CVA, breast cancer in remission, hypertension who presented from home with complaint of shortness of breath.  She was just discharged from here on 2/17 after she was admitted for PE .patient compliant with Xarelto.  CT angiogram done here on admission showed small new PE and slight worsening of previous PE, lymphadenopathy.  Required 6 L of oxygen per minute on arrival, now on 2 L of oxygen which is her baseline.  Admitted for the management of acute hypoxic respiratory secondary to current PE.  Patient seen and examined at bedside today.  During my evaluation, she looked comfortable.  She was on 2 L of oxygen.  She did not appear dyspneic.  She knew that she is in the hospital.  Wants to eat some breakfast.  Speaking in full sentences.   Assessment and plan:  Recurrent acute PE: Patient was admitted on 2/15 and was found to have PE at the time.  Presented with dyspnea again.  Repeat CT shows stable or slightly improved nonocclusive pulmonary emboli in the lower lobes bilaterally.New small nonocclusive embolus in the right middle lobe.  Currently on 2 L of oxygen. Echo done few days ago showed EF of 75%, grade 1 diastolic dysfunction, no right heart strain.  PE was thought to be secondary to immobility.  Will resume Xarelto on discharge.  Chronic hypoxic respiratory failure: On last admission, she required oxygen on discharge and has been on 3 L of oxygen per minute at home.  Currently on the same.  Mediastinal/hilar lymphadenopathy/history of bladder cancer: Similar to her last admission, CT scan showed stable mediastinal and bilateral hilar adenopathy.  Possible metastatic adenopathy or lymphoma .  As per recommendation on last admission, we recommend to follow-up with her oncologist and repeat CT imaging in 3 to 6 months.  Oncology following here.  She also has history of bladder cancer.  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  Needs to follow-up with urology, oncology as an outpatient.  She follows with Dr. Liliane Shi, urology  History of breast cancer: Status post lumpectomy in 2020, no longer on anastrozole.  We recommend follow-up with her oncologist  Iron is anemia: On iron supplementation  COPD: Currently not on exacerbation.  GAD: Continue home regimen  Hyperlipidemia: On statin  Hypertension: Does not take any medication at home  I called her daughter [name] to update for 2/21 about the management plan

## 2023-06-12 NOTE — ED Notes (Signed)
Patient at bedside commode

## 2023-06-12 NOTE — H&P (Signed)
History and Physical    Patient: Nicole Rush KGM:[id number] DOB: 06/16/1940 DOA: 06/11/2023 DOS: the patient was seen and examined on 06/12/2023 PCP: Leilani Able, MD  Patient coming from: Home  Chief Complaint:  Chief Complaint  Patient presents with   Shortness of Breath   HPI: Nicole Rush is a 83 y.o. female with medical history significant of recent diagnosis of pulmonary embolism on Xarelto, history of CVA, history of left breast cancer in remission, asthma, essential hypertension who presents to the ER with worsening shortness of breath.  Patient has had previous CVA with no deficit.  She was previously admitted to the hospital with pleuritic chest pain.  She had small-volume subsegmental acute PE.  Since discharge she has been compliant with her Xarelto.  Denied any missed doses.  Denied any other complaint patient was evaluated in the ER.  Repeat CTs shows small new PE and slight worsening of previous PE for more important significant lymphadenopathy that does not appear to be just reactive.  There is suggestion from radiology that this could be lymphoma.  Patient is requiring up to 6 L of oxygen per minute at the moment.  She is being admitted with acute hypoxic respiratory failure.  Review of Systems: As mentioned in the history of present illness. All other systems reviewed and are negative. 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   Complication of anesthesia    Dyspnea    Hypertension    Stroke (HCC)    per pt no defecits   Past Surgical History:  Procedure Laterality Date   BREAST LUMPECTOMY WITH AXILLARY LYMPH NODE BIOPSY Left 04/06/2019   Procedure: LEFT BREAST LUMPECTOMY WITH LEFT SENTINEL LYMPH NODE BIOPSY;  Surgeon: Almond Lint, MD;  Location: [facility];  Service: General;  Laterality: Left;   BREAST SURGERY     RE-EXCISION OF BREAST LUMPECTOMY Left 05/04/2019   Procedure: RE-EXCISION OF  LEFT BREAST LUMPECTOMY;  Surgeon: Almond Lint, MD;  Location:  SURGERY CENTER;  Service: General;  Laterality: Left;   Social History:  reports that she has quit smoking. She has been exposed to tobacco smoke. She has never used smokeless tobacco. She reports that she does not drink alcohol and does not use drugs.  No Known Allergies  Family History  Problem Relation Age of Onset   Breast cancer Mother    Lung cancer Father     Prior to Admission medications   Medication Sig Start Date End Date Taking? Authorizing Provider  acetaZOLAMIDE (DIAMOX) 250 MG tablet Take 1 tablet by mouth 2 (two) times daily. 05/25/23   [provider]  albuterol (VENTOLIN HFA) 108 (90 Base) MCG/ACT inhaler SMARTSIG:1 Puff(s) By Mouth Every 4-6 Hours PRN 05/15/23   [provider]  brimonidine (ALPHAGAN) 0.2 % ophthalmic solution Apply to eye. 04/29/23   [provider]  cefadroxil (DURICEF) 500 MG capsule Take 1 capsule (500 mg total) by mouth 2 (two) times daily for 4 days. 06/09/23 06/13/23  Lewie Chamber, MD  dorzolamide-timolol (COSOPT) 2-0.5 % ophthalmic solution Apply to eye. 04/29/23   [provider]  ferrous sulfate 325 (65 FE) MG tablet Take 1 tablet (325 mg total) by mouth daily with breakfast. 06/08/23   Lewie Chamber, MD  gabapentin (NEURONTIN) 400 MG capsule Take 400 mg by mouth 3 (three) times daily. 03/04/23   [provider]  latanoprost (XALATAN) 0.005 % ophthalmic solution Apply  to eye. 04/29/23   [provider]  magnesium oxide (MAG-OX) 400 MG tablet Take 1 tablet by mouth at bedtime. 02/17/23   [provider]  prednisoLONE acetate (PRED FORTE) 1 % ophthalmic suspension 1 drop 3 (three) times daily.    [provider]  rivaroxaban (XARELTO) 20 MG TABS tablet Take 1 tablet (20 mg total) by mouth daily with supper. Start taking after completing the starter pack 07/06/23   Lewie Chamber, MD  RIVAROXABAN Carlena Hurl) VTE  STARTER PACK (15 & 20 MG) Follow package directions: Take one 15mg  tablet by mouth twice a day for 21 days.  On day 22, switch to one 20mg  tablet once a day. Take with food. 06/08/23   Lewie Chamber, MD    Physical Exam: Vitals:   06/11/23 2056 06/11/23 2130 06/11/23 2253 06/11/23 2300  BP: (!) 141/76 (!) 145/80  (!) 145/83  Pulse: 83 84  87  Resp: 19 (!) 21  (!) 27  Temp: 98.8 F (37.1 C)  98.2 F (36.8 C) 98.2 F (36.8 C)  TempSrc: Oral  Oral Oral  SpO2: 100% 100%  96%   Constitutional: Acutely ill looking NAD, calm, comfortable Eyes: PERRL, lids and conjunctivae normal ENMT: Mucous membranes are moist. Posterior pharynx clear of any exudate or lesions.Normal dentition.  Neck: normal, supple, no masses, no thyromegaly Respiratory: Coarse breath sounds bilaterally, no wheezing, no crackles.  Increased respiratory effort. No accessory muscle use.  Cardiovascular: Regular rate and rhythm, no murmurs / rubs / gallops. No extremity edema. 2+ pedal pulses. No carotid bruits.  Abdomen: no tenderness, no masses palpated. No hepatosplenomegaly. Bowel sounds positive.  Musculoskeletal: Good range of motion, no joint swelling or tenderness, Skin: no rashes, lesions, ulcers. No induration Neurologic: CN 2-12 grossly intact. Sensation intact, DTR normal. Strength 5/5 in all 4.  Psychiatric: Normal judgment and insight. Alert and oriented x 3. Normal mood  Data Reviewed:  Blood pressure 114/76, pulse 83, hemoglobin 7.8, urinalysis showed WBC more than 50 #2 chest x-ray showed stable atelectasis possible trace right pleural effusion, CT angio chest showed stable or slightly improved nonocclusive pulmonary emboli in the lower lobes bilaterally with new small nonocclusive embolus in the right middle lobe.  There is new trace right pleural effusion dependent bibasilar atelectasis.  Also patient has stable mediastinal and bilateral hilar adenopathy which appears to be greater than reactive lymph nodes  and metastatic adenopathy or lymphoma cannot be excluded.  Assessment and Plan:  #1 acute hypoxic respiratory failure: In the setting of recent pulmonary emboli.  Patient does not appear to have pneumonia.  Could be worsening PE but also worsening lymphadenopathy is concerning.  Patient will be admitted.  Will continue empiric antibiotics for pneumonia.  Continue Xarelto.  May consider switching to heparin drip in case patient will need lymph node biopsy.  Titrate oxygen.  #2 pulmonary embolism: On Xarelto.  Patient still on the starter pack.  Will consider heparin drip instead if procedure is entertained to evaluate the lymphadenopathy.  #3 COPD: Could be acute exacerbation as well.  Continue to monitor.  Continue breathing treatments.  No steroids at this point.  #4 anemia of chronic disease: Continue to monitor H&H  #5 hyperlipidemia: Continue statin  #6 protein calorie malnutrition moderate: Nutrition counseling  #7 generalized anxiety disorder: Will confirm on resume home regimen.  #8 history of breast cancer: In remission.    Advance Care Planning:   Code Status: Full Code   Consults: Hematology Dr. Truett Perna  Family Communication:  No family at bedside  Severity of Illness: The appropriate patient status for this patient is INPATIENT. Inpatient status is judged to be reasonable and necessary in order to provide the required intensity of service to ensure the patient's safety. The patient's presenting symptoms, physical exam findings, and initial radiographic and laboratory data in the context of their chronic comorbidities is felt to place them at high risk for further clinical deterioration. Furthermore, it is not anticipated that the patient will be medically stable for discharge from the hospital within 2 midnights of admission.   * I certify that at the point of admission it is my clinical judgment that the patient will require inpatient hospital care spanning beyond 2  midnights from the point of admission due to high intensity of service, high risk for further deterioration and high frequency of surveillance required.*  AuthorLonia Blood, MD 06/12/2023 12:07 AM  For on call review [email or web address].

## 2023-06-12 NOTE — Care Management CC44 (Signed)
Condition Code 44 Documentation Completed  Patient Details  Name: Frances Ambrosino MRN: [id number] Date of Birth: Jan 02, 1941   Condition Code 44 given:  Yes Patient signature on Condition Code 44 notice:  Yes Documentation of 2 MD's agreement:  Yes Code 44 added to claim:  Yes    Lanier Clam, RN 06/12/2023, 3:25 PM

## 2023-06-12 NOTE — TOC Initial Note (Signed)
Transition of Care [facility]) - Initial/Assessment Note    Patient Details  Name: Nicole Rush MRN: [id number] Date of Birth: 06/14/1940  Transition of Care [facility]) CM/SW Contact:    Lanier Clam, RN Phone Number: 06/12/2023, 3:31 PM  Clinical Narrative:   spoke to dtr [name] about d/c plans home. Monitor for d/c plans.                Expected Discharge Plan: Home w Home Health Services Barriers to Discharge: Continued Medical Work up   Patient Goals and CMS Choice Patient states their goals for this hospitalization and ongoing recovery are:: Home CMS Medicare.gov Compare Post Acute Care list provided to:: Patient Represenative (must comment) (Donna(dtr)) Choice offered to / list presented to : Adult Children [place] ownership interest in [facility].provided to:: Adult Children    Expected Discharge Plan and Services   Discharge Planning Services: CM Consult Post Acute Care Choice: Home Health Living arrangements for the past 2 months: Single Family Home                                      Prior Living Arrangements/Services Living arrangements for the past 2 months: Single Family Home Lives with:: Adult Children              Current home services: DME ([facility])    Activities of Daily Living   ADL Screening (condition at time of admission) Independently performs ADLs?: Yes (appropriate for developmental age) Is the patient deaf or have difficulty hearing?: No Does the patient have difficulty seeing, even when wearing glasses/contacts?: No Does the patient have difficulty concentrating, remembering, or making decisions?: No  Permission Sought/Granted                  Emotional Assessment              Admission diagnosis:  Adenopathy [R59.9] Dyspnea on exertion [R06.09] Acute hypoxemic respiratory failure (HCC) [J96.01] Other acute pulmonary embolism, unspecified whether acute cor pulmonale present (HCC)  [I26.99] Acute hypoxic respiratory failure (HCC) [J96.01] Patient Active Problem List   Diagnosis Date Noted   Acute hypoxemic respiratory failure (HCC) 06/12/2023   Acute hypoxic respiratory failure (HCC) 06/12/2023   IDA (iron deficiency anemia) 06/08/2023   Acute cystitis 06/08/2023   Acute respiratory failure with hypoxia (HCC) 06/07/2023   Acute pulmonary embolism (HCC) 06/06/2023   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   Syncope 04/17/2021   AKI (acute kidney injury) (HCC) 04/17/2021   COVID-19 virus infection 04/17/2021   History of pericarditis 04/17/2021   Microcytic hypochromic anemia 04/17/2021   Hyponatremia 04/17/2021   Acute lower UTI 04/17/2021   Dehydration 04/17/2021   Protein calorie malnutrition (HCC) 04/01/2021   Cardiac tamponade    Protein-calorie malnutrition, moderate (HCC) 03/22/2021   Hyperlipidemia 03/22/2021   Essential hypertension 03/22/2021   Leukocytosis 03/22/2021   Falls 03/22/2021   Pericarditis 03/21/2021   Malignant neoplasm of upper-outer quadrant of left breast in female, estrogen receptor positive (HCC) 03/22/2019   Chest pain 08/11/2012   GERD (gastroesophageal reflux disease) 08/11/2012   Generalized anxiety disorder 08/11/2012   PCP:  Leilani Able, MD Pharmacy:   [facility] [phone] Ginette Otto,  - [address] AT [facility] [address] [place] [address] Phone: [phone] Fax: [phone]  [facility] [address]  [place] [address] Phone: [phone] Fax: [phone]  [facility] [address] [address] Phone: [phone] Fax: [phone]     Social Drivers of Health (SDOH) Social History: SDOH Screenings   Food Insecurity: No Food Insecurity (06/12/2023)  Housing: Low Risk  (06/12/2023)  Transportation Needs: No Transportation Needs (06/12/2023)  Utilities: Not At Risk  (06/12/2023)  Social Connections: Unknown (06/12/2023)  Tobacco Use: Medium Risk (06/06/2023)   SDOH Interventions:     Readmission Risk Interventions     No data to display

## 2023-06-12 NOTE — Care Management Obs Status (Signed)
MEDICARE OBSERVATION STATUS NOTIFICATION   Patient Details  Name: Tuwanda Vokes MRN: [id number] Date of Birth: 09-07-1940   Medicare Observation Status Notification Given:  Yes    Lanier Clam, RN 06/12/2023, 3:25 PM

## 2023-06-12 NOTE — Consult Note (Addendum)
Nicole Rush   DOB:[birth year]   JX#:[id number]      ASSESSMENT & PLAN:  1.  Adenopathy Lymphoma? - Per CT angio chest done 06/11/2023 which shows stable mediastinal and bilateral hilar adenopathy.  Appears to be greater than reactive lymph nodes and metastatic adenopathy or lymphoma cannot be excluded. - Medical oncology/Dr. Truett Perna will be following patient  2.  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  Shortness of breath/acute hypoxic respiratory failure History of PE - Patient reports short of breath x 1 month.  States she is on supplemental O2 at home. - Patient discharged on 06/08/2023 on Xarelto - CT angio chest done 06/11/2023 shows stable pulmonary emboli with trace right pleural effusion.   - Continue supplemental O2 - Continue to monitor closely  4.  Anemia of chronic disease - Hemoglobin was 7.8 on 04/09/2024.  PRBC transfusion was given. - Hemoglobin 9.6 today - On PRBC transfusion for hemoglobin <7.5 - No transfusional intervention at this time. - Continue oral iron supplementation - Continue to monitor CBC with differential  5.  History of breast cancer-followed by Dr. 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  This reports shortness of breath over a month despite being on oxygen at home.  Does not appear SOB however reports she does not want to talk to me any longer because she becomes short  of breath when she is talking.  Denies pain, active bleeding.  No other acute distress is noted.  Objective:  Vitals:   06/12/23 0716 06/12/23 0730  BP: 117/85 132/80  Pulse: 80 73  Resp: 16 16  Temp: (!) 97.5 F (36.4 C)   SpO2: 92% 100%     Intake/Output Summary (Last 24 hours) at 06/12/2023 2130 Last data filed at 06/11/2023 2326 Gross per 24 hour  Intake 509.5 ml  Output --  Net 509.5 ml     REVIEW OF SYSTEMS:   Constitutional: Denies fevers, chills or abnormal night sweats Eyes: Denies blurriness of vision, double vision or watery eyes Ears, nose, mouth, throat, and face: Denies mucositis or sore throat Respiratory: + Shortness of breath Cardiovascular: Denies palpitation, chest discomfort or lower extremity swelling Gastrointestinal:  Denies nausea, heartburn or change in bowel habits Skin: Denies abnormal skin rashes Lymphatics: Denies new lymphadenopathy or easy bruising Neurological: Denies numbness, tingling or new weaknesses Behavioral/Psych: Mood is stable, no new changes  All other systems were reviewed with the patient and are negative.  PHYSICAL EXAMINATION: ECOG PERFORMANCE STATUS: 3 - Symptomatic, >50% confined to bed  Vitals:   06/12/23 0716 06/12/23 0730  BP: 117/85 132/80  Pulse: 80 73  Resp: 16 16  Temp: (!) 97.5 F (36.4 C)   SpO2: 92% 100%   There were no vitals filed for this visit.  GENERAL: alert, no distress and comfortable SKIN: skin color, texture, turgor are normal, no rashes or significant lesions EYES: normal, conjunctiva are pink and non-injected, sclera clear OROPHARYNX: no exudate, no erythema and lips, buccal mucosa, and tongue normal  NECK:  supple, thyroid normal size, non-tender, without nodularity LYMPH: no palpable lymphadenopathy in the cervical, axillary or inguinal LUNGS: clear to auscultation and percussion with normal breathing effort HEART: regular rate & rhythm and no murmurs and no lower extremity edema ABDOMEN:  abdomen soft, non-tender and normal bowel sounds MUSCULOSKELETAL: no cyanosis of digits and no clubbing  PSYCH: alert & oriented x 3 with fluent speech NEURO: no focal motor/sensory deficits   All questions were answered. The patient knows to call the clinic with any problems, questions or concerns.   The total time spent in the appointment was 60 minutes encounter with patient including review of chart and various tests results, discussions about plan of care and coordination of care plan  Dawson Bills, NP 06/12/2023 9:26 AM    Labs Reviewed:  Lab Results  Component Value Date   WBC 9.9 06/12/2023   HGB 9.6 (L) 06/12/2023   HCT 30.7 (L) 06/12/2023   MCV 80.6 06/12/2023   PLT 325 06/12/2023   Recent Labs    03/25/23 0137 05/14/23 0049 06/06/23 0658 06/07/23 0727 06/11/23 2111 06/12/23 0618  NA 139 142 138 138 139 137  K 3.7 3.2* 3.1* 4.1 3.6 3.5  CL 114* 113* 104 104 104 105  CO2 17* 19* 24 24  --  21*  GLUCOSE 108* 101* 91 90 92 69*  BUN 39* 34* 16 14 15 13   CREATININE 0.91 0.87 0.68 0.73 0.70 0.57  CALCIUM 9.3 9.3 8.5* 8.5*  --  7.5*  GFRNONAA >60 >60 >60 >60  --  >60  PROT 7.9 7.2  --   --   --  6.0*  ALBUMIN 3.7 3.2*  --   --   --  2.5*  AST 17 16  --   --   --  15  ALT 15 12  --   --   --  12  ALKPHOS 76 71  --   --   --  58  BILITOT 0.4 0.4  --   --   --  0.7    Studies Reviewed:  CT Angio Chest Pulmonary Embolism (PE) W or WO Contrast Result Date: 06/11/2023 CLINICAL DATA:  Pulmonary embolism (PE) suspected, high prob. Shortness of breath EXAM: CT ANGIOGRAPHY CHEST WITH CONTRAST TECHNIQUE: Multidetector CT imaging of the chest was performed using the standard protocol during bolus administration of intravenous contrast. Multiplanar CT image reconstructions and MIPs were obtained to evaluate the vascular anatomy. RADIATION DOSE REDUCTION: This exam was performed according to the departmental dose-optimization program which includes automated exposure control,  adjustment of the mA and/or kV according to patient size and/or use of iterative reconstruction technique. CONTRAST:  75mL OMNIPAQUE IOHEXOL 350 MG/ML SOLN COMPARISON:  06/06/2023 FINDINGS: Cardiovascular: Bilateral lower lobe pulmonary emboli are again noted. Overall clot burden similar or slightly decreased since prior study. Small nonocclusive pulmonary embolus within the right middle lobe, not definitively seen on prior study. Heart is mildly enlarged. Aorta normal caliber. No dissection. Scattered calcifications. Mediastinum/Nodes: Mediastinal and bilateral hilar adenopathy again noted, similar to prior study. Trachea and esophagus are unremarkable. Thyroid unremarkable. Lungs/Pleura: Trace right pleural effusion. Bibasilar dependent atelectasis. Upper Abdomen: No acute findings Musculoskeletal: Chest wall soft tissues are unremarkable. No acute bony abnormality. Review of the MIP images confirms the above findings. IMPRESSION: Stable or slightly improved nonocclusive pulmonary emboli in the lower lobes bilaterally. New small nonocclusive embolus in the right middle lobe. New trace right pleural effusion.  Dependent bibasilar atelectasis. Stable mediastinal and bilateral  hilar adenopathy. This appears to be greater than reactive lymph nodes and metastatic adenopathy or lymphoma cannot be excluded. Aortic Atherosclerosis (ICD10-I70.0). Electronically Signed   By: Charlett Nose M.D.   On: 06/11/2023 22:09   DG Chest Port 1 View Result Date: 06/11/2023 CLINICAL DATA:  Shortness of breath.  Productive cough. EXAM: PORTABLE CHEST 1 VIEW COMPARISON:  Radiograph and CT 5 days ago 06/06/2023 FINDINGS: Stable heart size and mediastinal contours. Right hilar prominence corresponding to adenopathy on prior exam. Stable atelectasis/scarring in the periphery of the right lung base. There may be a trace right pleural effusion. No acute airspace disease. No pulmonary edema. No pneumothorax. Left axillary surgical clips.  IMPRESSION: Stable atelectasis/scarring in the periphery of the right lung base. Possible trace right pleural effusion. Electronically Signed   By: Narda Rutherford M.D.   On: 06/11/2023 15:05   VAS [place] LOWER EXTREMITY VENOUS (DVT) Result Date: 06/06/2023  Lower Venous DVT Study Patient Name:  BODHI STENGLEIN [facility]  Date of Exam:   06/06/2023 Medical Rec #: [id number]           Accession #:    [id number] Date of Birth: Feb 11, 1941            Patient Gender: F Patient Age:   34 years Exam Location:  [facility] Procedure:      VAS [place] LOWER EXTREMITY VENOUS (DVT) Referring Phys: MIR [facility] --------------------------------------------------------------------------------  Indications: Pulmonary embolism.  Risk Factors: Hx of breast cancer. Comparison Study: Previous exam (RLEV) on 03/13/2013 was negative for DVT. Performing Technologist: Ernestene Mention RVT, RDMS  Examination Guidelines: A complete evaluation includes B-mode imaging, spectral Doppler, color Doppler, and power Doppler as needed of all accessible portions of each vessel. Bilateral testing is considered an integral part of a complete examination. Limited examinations for reoccurring indications may be performed as noted. The reflux portion of the exam is performed with the patient in reverse Trendelenburg.  +---------+---------------+---------+-----------+----------+--------------+ RIGHT    CompressibilityPhasicitySpontaneityPropertiesThrombus Aging +---------+---------------+---------+-----------+----------+--------------+ CFV      Full           Yes      Yes                                 +---------+---------------+---------+-----------+----------+--------------+ SFJ      Full                                                        +---------+---------------+---------+-----------+----------+--------------+ FV Prox  Full           Yes      Yes                                  +---------+---------------+---------+-----------+----------+--------------+ FV Mid   Full           Yes      Yes                                 +---------+---------------+---------+-----------+----------+--------------+ FV DistalFull           Yes      Yes                                 +---------+---------------+---------+-----------+----------+--------------+  PFV      Full                                                        +---------+---------------+---------+-----------+----------+--------------+ POP      Full           Yes      Yes                                 +---------+---------------+---------+-----------+----------+--------------+ PTV      Full                                                        +---------+---------------+---------+-----------+----------+--------------+ PERO     Full                                                        +---------+---------------+---------+-----------+----------+--------------+   +---------+---------------+---------+-----------+----------+--------------+ LEFT     CompressibilityPhasicitySpontaneityPropertiesThrombus Aging +---------+---------------+---------+-----------+----------+--------------+ CFV      Full           Yes      Yes                                 +---------+---------------+---------+-----------+----------+--------------+ SFJ      Full                                                        +---------+---------------+---------+-----------+----------+--------------+ FV Prox  Full           Yes      Yes                                 +---------+---------------+---------+-----------+----------+--------------+ FV Mid   Full           Yes      Yes                                 +---------+---------------+---------+-----------+----------+--------------+ FV DistalFull           Yes      Yes                                  +---------+---------------+---------+-----------+----------+--------------+ PFV      Full                                                        +---------+---------------+---------+-----------+----------+--------------+  POP      Full           Yes      Yes                                 +---------+---------------+---------+-----------+----------+--------------+ PTV      Full                                                        +---------+---------------+---------+-----------+----------+--------------+ PERO     Full                                                        +---------+---------------+---------+-----------+----------+--------------+     Summary: BILATERAL: - No evidence of deep vein thrombosis seen in the lower extremities, bilaterally. -No evidence of popliteal cyst, bilaterally.   *See table(s) above for measurements and observations. Electronically signed by Carolynn Sayers on 06/06/2023 at 8:18:30 PM.    Final    ECHOCARDIOGRAM COMPLETE Result Date: 06/06/2023    ECHOCARDIOGRAM REPORT   Patient Name:   AMIRACLE NEISES Date of Exam: 06/06/2023 Medical Rec #:  [id number]          Height:       60.0 in Accession #:    [id number]         Weight:       95.0 lb Date of Birth:  Jul 02, 1940           BSA:          1.360 m Patient Age:    82 years           BP:           147/107 mmHg Patient Gender: F                  HR:           86 bpm. Exam Location:  Inpatient Procedure: 2D Echo, 3D Echo, Cardiac Doppler, Color Doppler and Strain Analysis            (Both Spectral and Color Flow Doppler were utilized during            procedure). Indications:    Pulmonary embolus  History:        Patient has prior history of Echocardiogram examinations, most                 recent 04/17/2021. COPD, Signs/Symptoms:Syncope and Chest Pain;                 Risk Factors:Hypertension and Dyslipidemia. Pericarditis,                 Cardiac tamponade.  Sonographer:    Vern Claude Referring Phys:  [id number] MIR M [facility] IMPRESSIONS  1. Left ventricular ejection fraction, by estimation, is >75%. The left ventricle has hyperdynamic function. The left ventricle has no regional wall motion abnormalities. Left ventricular diastolic parameters are consistent with Grade I diastolic dysfunction (impaired relaxation). The average left ventricular global longitudinal strain is -18.1 %. The global longitudinal strain is normal.  2. Right ventricular systolic function  is normal. The right ventricular size is normal. There is moderately elevated pulmonary artery systolic pressure.  3. Left atrial size was mildly dilated.  4. The mitral valve is normal in structure. No evidence of mitral valve regurgitation. No evidence of mitral stenosis.  5. The tricuspid valve is abnormal.  6. The aortic valve is tricuspid. There is mild calcification of the aortic valve. There is mild thickening of the aortic valve. Aortic valve regurgitation is not visualized. No aortic stenosis is present.  7. The inferior vena cava is normal in size with greater than 50% respiratory variability, suggesting right atrial pressure of 3 mmHg. FINDINGS  Left Ventricle: Left ventricular ejection fraction, by estimation, is >75%. The left ventricle has hyperdynamic function. The left ventricle has no regional wall motion abnormalities. The average left ventricular global longitudinal strain is -18.1 %. Strain was performed and the global longitudinal strain is normal. The left ventricular internal cavity size was normal in size. There is no left ventricular hypertrophy. Left ventricular diastolic parameters are consistent with Grade I diastolic dysfunction (impaired relaxation). Normal left ventricular filling pressure. Right Ventricle: The right ventricular size is normal. Right vetricular wall thickness was not well visualized. Right ventricular systolic function is normal. There is moderately elevated pulmonary artery systolic pressure. The tricuspid  regurgitant velocity is 3.45 m/s, and with an assumed right atrial pressure of 3 mmHg, the estimated right ventricular systolic pressure is 50.6 mmHg. Left Atrium: Left atrial size was mildly dilated. Right Atrium: Right atrial size was normal in size. Pericardium: There is no evidence of pericardial effusion. Mitral Valve: The mitral valve is normal in structure. No evidence of mitral valve regurgitation. No evidence of mitral valve stenosis. MV peak gradient, 3.1 mmHg. The mean mitral valve gradient is 2.0 mmHg. Tricuspid Valve: The tricuspid valve is abnormal. Tricuspid valve regurgitation is mild . No evidence of tricuspid stenosis. Aortic Valve: The aortic valve is tricuspid. There is mild calcification of the aortic valve. There is mild thickening of the aortic valve. There is mild aortic valve annular calcification. Aortic valve regurgitation is not visualized. No aortic stenosis  is present. Aortic valve mean gradient measures 4.0 mmHg. Aortic valve peak gradient measures 8.8 mmHg. Aortic valve area, by VTI measures 2.75 cm. Pulmonic Valve: The pulmonic valve was not well visualized. Pulmonic valve regurgitation is not visualized. No evidence of pulmonic stenosis. Aorta: The aortic root and ascending aorta are structurally normal, with no evidence of dilitation. Venous: The inferior vena cava is normal in size with greater than 50% respiratory variability, suggesting right atrial pressure of 3 mmHg. IAS/Shunts: No atrial level shunt detected by color flow Doppler. Additional Comments: 3D was performed not requiring image post processing on an independent workstation and was normal.  LEFT VENTRICLE PLAX 2D LVIDd:         3.40 cm      Diastology LVIDs:         2.20 cm      LV e' medial:    6.64 cm/s LV PW:         0.80 cm      LV E/e' medial:  10.2 LV IVS:        1.00 cm      LV e' lateral:   7.29 cm/s LVOT diam:     1.90 cm      LV E/e' lateral: 9.3 LV SV:         62 LV SV Index:   45  2D  Longitudinal Strain LVOT Area:     2.84 cm     2D Strain GLS Avg:     -18.1 %  LV Volumes (MOD) LV vol d, MOD A2C: 80.7 ml LV vol d, MOD A4C: 100.0 ml LV vol s, MOD A2C: 26.5 ml LV vol s, MOD A4C: 30.2 ml LV SV MOD A2C:     54.2 ml LV SV MOD A4C:     100.0 ml LV SV MOD BP:      61.0 ml RIGHT VENTRICLE             IVC RV Basal diam:  2.80 cm     IVC diam: 1.90 cm RV Mid diam:    1.70 cm RV S prime:     12.20 cm/s TAPSE (M-mode): 2.5 cm LEFT ATRIUM             Index        RIGHT ATRIUM           Index LA diam:        2.70 cm 1.98 cm/m   RA Area:     10.70 cm LA Vol (A2C):   44.0 ml 32.34 ml/m  RA Volume:   18.60 ml  13.67 ml/m LA Vol (A4C):   53.5 ml 39.33 ml/m LA Biplane Vol: 48.6 ml 35.73 ml/m  AORTIC VALVE                    PULMONIC VALVE AV Area (Vmax):    2.61 cm     PV Vmax:       1.11 m/s AV Area (Vmean):   2.37 cm     PV Peak grad:  4.9 mmHg AV Area (VTI):     2.75 cm AV Vmax:           148.00 cm/s AV Vmean:          92.700 cm/s AV VTI:            0.224 m AV Peak Grad:      8.8 mmHg AV Mean Grad:      4.0 mmHg LVOT Vmax:         136.00 cm/s LVOT Vmean:        77.400 cm/s LVOT VTI:          0.217 m LVOT/AV VTI ratio: 0.97  AORTA Ao Root diam: 2.90 cm Ao Asc diam:  3.00 cm MITRAL VALVE               TRICUSPID VALVE MV Area (PHT): 3.50 cm    TR Peak grad:   47.6 mmHg MV Area VTI:   2.87 cm    TR Vmax:        345.00 cm/s MV Peak grad:  3.1 mmHg MV Mean grad:  2.0 mmHg    SHUNTS MV Vmax:       0.88 m/s    Systemic VTI:  0.22 m MV Vmean:      62.5 cm/s   Systemic Diam: 1.90 cm MV Decel Time: 217 msec MV E velocity: 67.80 cm/s MV A velocity: 92.40 cm/s MV E/A ratio:  0.73 Dina Rich MD Electronically signed by Dina Rich MD Signature Date/Time: 06/06/2023/4:20:07 PM    Final    CT Angio Chest PE W and/or Wo Contrast Addendum Date: 06/06/2023 ADDENDUM REPORT: 06/06/2023 09:58 ADDENDUM: Critical Value/emergent results were called by telephone at the time of interpretation on 06/06/2023 at 9:52  am to provider [facility] ,  who verbally acknowledged these results. Electronically Signed   By: Jules Schick M.D.   On: 06/06/2023 09:58   Result Date: 06/06/2023 CLINICAL DATA:  Pulmonary embolism (PE) suspected, high prob. Shortness of breath. Cough and headache. EXAM: CT ANGIOGRAPHY CHEST WITH CONTRAST TECHNIQUE: Multidetector CT imaging of the chest was performed using the standard protocol during bolus administration of intravenous contrast. Multiplanar CT image reconstructions and MIPs were obtained to evaluate the vascular anatomy. RADIATION DOSE REDUCTION: This exam was performed according to the departmental dose-optimization program which includes automated exposure control, adjustment of the mA and/or kV according to patient size and/or use of iterative reconstruction technique. CONTRAST:  75mL OMNIPAQUE IOHEXOL 350 MG/ML SOLN COMPARISON:  CT scan chest from 06/17/2021. FINDINGS: Cardiovascular: Small volume acute pulmonary embolism noted. There is nonocclusive pulmonary embolism in the left lung lower lobe posterior segmental pulmonary artery branch with extension into the subsegmental pulmonary artery branches. There is also small volume occlusive pulmonary embolism in the right lung upper lobe segmental pulmonary artery branch and very small volume nonocclusive embolism in the distal subsegmental pulmonary branch to the right lung lower lobe posterior segment. No right heart strain or lung infarction. There is dilation of the main pulmonary trunk measuring up to 3.2 cm, which is nonspecific but can be seen with pulmonary artery hypertension. Normal cardiac size. No pericardial effusion. No aortic aneurysm. There are coronary artery calcifications, in keeping with coronary artery disease. There are also mild peripheral atherosclerotic vascular calcifications of thoracic aorta and its major branches. Mediastinum/Nodes: Visualized thyroid gland appears grossly unremarkable. No solid / cystic  mediastinal masses. The esophagus is nondistended precluding optimal assessment. There is soft tissue fullness in the mediastinum and bilateral hila, favored to represent enlarged lymph nodes which appears more pronounced than the prior exam however, favored benign/reactive. Correlate clinically to determine the need for follow-up imaging in 3-6 months to document resolution. No axillary lymphadenopathy by size criteria. Lungs/Pleura: The central tracheo-bronchial tree is patent. Upper lobe predominant moderate emphysematous changes noted. There are patchy areas of linear, plate-like atelectasis and/or scarring throughout bilateral lungs, with asymmetric more involvement of right lung lower lobe. No mass or consolidation. No pleural effusion or pneumothorax. No suspicious lung nodules. Upper Abdomen: Visualized upper abdominal viscera within normal limits. Musculoskeletal: The visualized soft tissues of the chest wall are grossly unremarkable. No suspicious osseous lesions. There are mild multilevel degenerative changes in the visualized spine. Review of the MIP images confirms the above findings. IMPRESSION: 1. Small volume acute occlusive and nonocclusive pulmonary embolism, as described in detail above. No right heart strain or lung infarction. 2. Mild interval increase in the size of pre-existing mediastinal and hilar lymphadenopathy, favored benign/reactive. However, correlate clinically to determine the need for follow-up imaging in 3-6 months to document resolution. 3. There are patchy areas of scarring/atelectasis throughout bilateral lungs with asymmetric more involvement of right lung lower lobe. However, no lung mass, consolidation, pleural effusion or pneumothorax. 4. Multiple other nonacute observations, as described above. Aortic Atherosclerosis (ICD10-I70.0) and Emphysema (ICD10-J43.9). Electronically Signed: By: Jules Schick M.D. On: 06/06/2023 09:46   DG Chest Port 1 View Result Date:  06/06/2023 CLINICAL DATA:  Shortness of breath EXAM: PORTABLE CHEST 1 VIEW COMPARISON:  05/14/2023 FINDINGS: Normal heart size and mediastinal contours.  Aortic tortuosity. Streaky density at the right lung base compatible with scarring seen on chest CT 06/17/2021. There is no edema, consolidation, effusion, or pneumothorax. Postoperative left breast and axilla. IMPRESSION: Cardiomegaly without edema or focal pneumonia.  Electronically Signed   By: Tiburcio Pea M.D.   On: 06/06/2023 07:31   CT Head Wo Contrast Result Date: 05/14/2023 CLINICAL DATA:  Head trauma, minor (Age >= 65y) syncope/struck head. Fall EXAM: CT HEAD WITHOUT CONTRAST TECHNIQUE: Contiguous axial images were obtained from the base of the skull through the vertex without intravenous contrast. RADIATION DOSE REDUCTION: This exam was performed according to the departmental dose-optimization program which includes automated exposure control, adjustment of the mA and/or kV according to patient size and/or use of iterative reconstruction technique. COMPARISON:  03/05/2023 FINDINGS: Brain: Diffuse cerebral atrophy. No acute intracranial abnormality. Specifically, no hemorrhage, hydrocephalus, mass lesion, acute infarction, or significant intracranial injury. Vascular: No hyperdense vessel or unexpected calcification. Skull: No acute calvarial abnormality. Sinuses/Orbits: No acute findings Other: None IMPRESSION: No acute intracranial abnormality. Electronically Signed   By: Charlett Nose M.D.   On: 05/14/2023 01:37   DG Chest Port 1 View Result Date: 05/14/2023 CLINICAL DATA:  Syncopal episode, fell at home. EXAM: PORTABLE CHEST 1 VIEW COMPARISON:  HRCT 06/17/2021 FINDINGS: The cardiac size normal. Stable mediastinum with aortic tortuosity and atherosclerosis. Both lungs are slightly emphysematous but clear. The visualized skeletal structures are unremarkable. Surgical clips in the left axilla and chest wall are again shown. No new osseous  findings. IMPRESSION: No evidence of acute chest disease. Emphysema. Aortic atherosclerosis. Electronically Signed   By: Almira Bar M.D.   On: 05/14/2023 00:35   Ms. Ptacek was interviewed and examined.  I reviewed admission CT images.  She is admitted with respiratory failure in the setting of recent pulmonary emboli.  She is noted to have mediastinal/hilar adenopathy.  She has a remote history of stage breast cancer and a recent diagnosis of urothelial carcinoma, status post TURBT of a bladder dome tumor with intravesical gemcitabine 05/20/2023.  I suspect the pulmonary embolism is related immobility, malignancy, and recent surgery.  She should continue anticoagulation therapy.  The chest lymphadenopathy could be reactive to the recent pulmonary embolism, but I am concerned the lymphadenopathy is related to [place].  The differential diagnosis includes metastatic urothelial cancer, breast cancer, and lung cancer.  Lymphoma is also possible.  Physical exam today revealed mild facial/periorbital edema, no lymphadenopathy.  Ms. Ewald was a poor historian when I saw her today.  She has anemia, likely secondary to iron deficiency and GU blood loss.  She was transfused with packed red blood cells on 06/11/2023.  Recommendations: Continue anticoagulation Management of respiratory failure per the medical service CT abdomen/pelvis-has been ordered by urology as an outpatient, can be completed in the hospital Can consider biopsy of a mediastinal node or other lymph node depending on the CT abdomen/pelvis results She has facial/periorbital edema on exam today.,  She could have early SVC syndrome secondary to the mediastinal tumor, consider changing to heparin anticoagulation and consulting pulmonary medicine for an urgent biopsy if the edema progresses Please call oncology as needed over the weekend, I will check on her 06/15/2023

## 2023-06-13 ENCOUNTER — Other Ambulatory Visit: Payer: Self-pay

## 2023-06-13 ENCOUNTER — Encounter (HOSPITAL_COMMUNITY): Payer: Self-pay | Admitting: Internal Medicine

## 2023-06-13 DIAGNOSIS — J9601 Acute respiratory failure with hypoxia: Secondary | ICD-10-CM | POA: Diagnosis not present

## 2023-06-13 LAB — GLUCOSE, CAPILLARY
Glucose-Capillary: 103 mg/dL — ABNORMAL HIGH (ref 70–99)
Glucose-Capillary: 104 mg/dL — ABNORMAL HIGH (ref 70–99)
Glucose-Capillary: 106 mg/dL — ABNORMAL HIGH (ref 70–99)
Glucose-Capillary: 114 mg/dL — ABNORMAL HIGH (ref 70–99)
Glucose-Capillary: 84 mg/dL (ref 70–99)
Glucose-Capillary: 89 mg/dL (ref 70–99)

## 2023-06-13 LAB — CBC
HCT: 32.3 % — ABNORMAL LOW (ref 36.0–46.0)
Hemoglobin: 10.4 g/dL — ABNORMAL LOW (ref 12.0–15.0)
MCH: 25.7 pg — ABNORMAL LOW (ref 26.0–34.0)
MCHC: 32.2 g/dL (ref 30.0–36.0)
MCV: 79.8 fL — ABNORMAL LOW (ref 80.0–100.0)
Platelets: 334 10*3/uL (ref 150–400)
RBC: 4.05 MIL/uL (ref 3.87–5.11)
RDW: 21.1 % — ABNORMAL HIGH (ref 11.5–15.5)
WBC: 8.7 10*3/uL (ref 4.0–10.5)
nRBC: 0 % (ref 0.0–0.2)

## 2023-06-13 LAB — BASIC METABOLIC PANEL
Anion gap: 11 (ref 5–15)
BUN: 14 mg/dL (ref 8–23)
CO2: 21 mmol/L — ABNORMAL LOW (ref 22–32)
Calcium: 8.9 mg/dL (ref 8.9–10.3)
Chloride: 104 mmol/L (ref 98–111)
Creatinine, Ser: 0.72 mg/dL (ref 0.44–1.00)
GFR, Estimated: >60 mL/min (ref >60)
Glucose, Bld: 86 mg/dL (ref 70–99)
Potassium: 3.8 mmol/L (ref 3.5–5.1)
Sodium: 136 mmol/L (ref 135–145)

## 2023-06-13 MED ORDER — POLYETHYLENE GLYCOL 3350 17 G PO PACK
17.0000 g | PACK | Freq: Every day | ORAL | Status: DC
  Administered 2023-06-13 – 2023-06-16 (×4): 17 g via ORAL
  Filled 2023-06-13 (×4): qty 1

## 2023-06-13 MED ORDER — DOXYCYCLINE HYCLATE 100 MG PO TABS
100.0000 mg | ORAL_TABLET | Freq: Two times a day (BID) | ORAL | Status: DC
  Administered 2023-06-13 – 2023-06-15 (×4): 100 mg via ORAL
  Filled 2023-06-13 (×4): qty 1

## 2023-06-13 MED ORDER — SENNOSIDES-DOCUSATE SODIUM 8.6-50 MG PO TABS
1.0000 | ORAL_TABLET | Freq: Two times a day (BID) | ORAL | Status: DC
  Administered 2023-06-13 – 2023-06-16 (×6): 1 via ORAL
  Filled 2023-06-13 (×7): qty 1

## 2023-06-13 NOTE — Progress Notes (Addendum)
 PROGRESS NOTE  Nicole Rush  ZOX:[id number] DOB: March 15, 1941 DOA: 06/11/2023 PCP: Leilani Able, MD   Brief Narrative: Patient is 64 female with history of PE on Xarelto, CVA, breast cancer in remission, hypertension who presented from home with complaint of shortness of breath.  She was just discharged from here on 2/17 after she was admitted for PE .patient compliant with Xarelto.  CT angiogram done here on admission showed small new PE and slight worsening of previous PE, lymphadenopathy.  Required 6 L of oxygen per minute on arrival, now on 2 L of oxygen which is her baseline.  Admitted for the management of acute hypoxic respiratory secondary to current PE.  Oncology, palliative care consulted.  Patient /daughter are interested on hospice approach.  CODE STATUS changed to DNR  Assessment & Plan:  Principal Problem:   Acute hypoxemic respiratory failure (HCC) Active Problems:   Acute pulmonary embolism (HCC)   Malignant neoplasm of upper-outer quadrant of left breast in female, estrogen receptor positive (HCC)   GERD (gastroesophageal reflux disease)   Generalized anxiety disorder   Protein-calorie malnutrition, moderate (HCC)   Hyperlipidemia   Essential hypertension   COPD with chronic bronchitis and emphysema (HCC)   Acute hypoxic respiratory failure (HCC)   Mediastinal/hilar lymphadenopathy/history of bladder cancer/Goals of care: Similar to her last admission, CT scan showed stable mediastinal and bilateral hilar adenopathy.  Possible metastatic adenopathy or lymphoma .  Oncology following here.  She also has history of bladder cancer.  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   She follows with Dr. Liliane Shi, urology. Leukocytosis she also has facial/periorbital edema, SVC syndrome is a possibility secondary to mediastinal tumor Oncology recommended goals of care  discussion/hospice.  After discussion about  goals of care with daughter  and patient, decision made to make her DNR at this point.  Home with hospice is the best approach for her but her daughter runs a restaurant and may not be able to take care of her at home.We will consult TOC  Recurrent acute PE: Patient was admitted on 2/15 and was found to have PE at the time.  Presented with dyspnea again.  Repeat CT shows stable or slightly improved nonocclusive pulmonary emboli in the lower lobes bilaterally.New small nonocclusive embolus in the right middle lobe.  Currently on 2 L of oxygen. Echo done few days ago showed EF of 75%, grade 1 diastolic dysfunction, no right heart strain.  PE was thought to be secondary to immobility.  Xarelto resumed.  Chronic hypoxic respiratory failure: On last admission, she required oxygen on discharge and has been on 3 L of oxygen per minute at home.  Currently on 2L   History of breast cancer: Status post lumpectomy in 2020, no longer on anastrozole.   Iron is anemia: On iron supplementation  COPD: Currently not on exacerbation.  GAD: Continue home regimen  Hyperlipidemia: On statin  Hypertension: Does not take any medication at home         DVT prophylaxis: Rivaroxaban (XARELTO) tablet 15 mg  rivaroxaban (XARELTO) tablet 20 mg     Code Status: Full Code  Family Communication: Called and discussed with daughter [name] on phone on 2/22  Patient status: Obs  Patient is from : Home   Anticipated discharge WJ:XBJY with hospice  Estimated DC date:1-2 days   Consultants: Oncology  Procedures:None  Antimicrobials:  Anti-infectives (From admission, onward)    Start  Dose/Rate Route Frequency Ordered Stop   06/12/23 0300  doxycycline (VIBRAMYCIN) 100 mg in sodium chloride 0.9 % 250 mL IVPB        100 mg 125 mL/hr over 120 Minutes Intravenous Every 12 hours 06/12/23 0245         Subjective:  Patient seen and examined at bedside today.   She was comfortable.  On 2 L of oxygen.  Denies shortness of breath or chest pain.  Eating her breakfast.  Goals of care discussed with her at bedside.  She states she she just wants to be comfortable and wants to go home.    Called daughter [name]  on phone  Objective: Vitals:   06/12/23 2028 06/12/23 2037 06/13/23 0454 06/13/23 0942  BP: (!) 155/92  128/81 106/62  Pulse: 93  86 87  Resp:   18 19  Temp: 98.9 F (37.2 C)  98.8 F (37.1 C) 99.3 F (37.4 C)  TempSrc: Oral  Oral Oral  SpO2: 98% 99% 98%     Intake/Output Summary (Last 24 hours) at 06/13/2023 1014 Last data filed at 06/13/2023 1610 Gross per 24 hour  Intake 377.19 ml  Output --  Net 377.19 ml   There were no vitals filed for this visit.  Examination:  General exam: Overall comfortable, not in distress, chronically looking, deconditioned elderly female HEENT: PERRL, facial edema Respiratory system:  no wheezes or crackles, diminished air sounds on bases Cardiovascular system: S1 & S2 heard, RRR.  Gastrointestinal system: Abdomen is nondistended, soft and nontender. Central nervous system: Alert and oriented Extremities: No edema, no clubbing ,no cyanosis Skin: No rashes, no ulcers,no icterus     Data Reviewed: I have personally reviewed following labs and imaging studies  CBC: Recent Labs  Lab 06/07/23 0727 06/11/23 1327 06/11/23 2111 06/12/23 0618 06/13/23 0816  WBC 9.7 9.3  --  9.9 8.7  HGB 8.6* 7.9* 7.8* 9.6* 10.4*  HCT 27.3* 25.1* 23.0* 30.7* 32.3*  MCV 76.9* 78.2*  --  80.6 79.8*  PLT 367 343  --  325 334   Basic Metabolic Panel: Recent Labs  Lab 06/07/23 0727 06/11/23 2111 06/12/23 0618 06/13/23 0816  NA 138 139 137 136  K 4.1 3.6 3.5 3.8  CL 104 104 105 104  CO2 24  --  21* 21*  GLUCOSE 90 92 69* 86  BUN 14 15 13 14   CREATININE 0.73 0.70 0.57 0.72  CALCIUM 8.5*  --  7.5* 8.9     Recent Results (from the past 240 hours)  Resp panel by RT-PCR (RSV, Flu A&B, Covid) Anterior Nasal  Swab     Status: None   Collection Time: 06/06/23  7:33 AM   Specimen: Anterior Nasal Swab  Result Value Ref Range Status   SARS Coronavirus 2 by RT PCR NEGATIVE NEGATIVE Final    Comment: (NOTE) SARS-CoV-2 target nucleic acids are NOT DETECTED.  The SARS-CoV-2 RNA is generally detectable in upper respiratory specimens during the acute phase of infection. The lowest concentration of SARS-CoV-2 viral copies this assay can detect is 138 copies/mL. A negative result does not preclude SARS-Cov-2 infection and should not be used as the sole basis for treatment or other patient management decisions. A negative result may occur with  improper specimen collection/handling, submission of specimen other than nasopharyngeal swab, presence of viral mutation(s) within the areas targeted by this assay, and inadequate number of viral copies(<138 copies/mL). A negative result must be combined with clinical observations, patient history, and epidemiological information.  The expected result is Negative.  Fact Sheet for Patients:  BloggerCourse.com  Fact Sheet for Healthcare Providers:  SeriousBroker.it  This test is no t yet approved or cleared by the [place] FDA and  has been authorized for detection and/or diagnosis of SARS-CoV-2 by FDA under an Emergency Use Authorization (EUA). This EUA will remain  in effect (meaning this test can be used) for the duration of the COVID-19 declaration under Section 564(b)(1) of the Act, 21 U.S.C.section 360bbb-3(b)(1), unless the authorization is terminated  or revoked sooner.       Influenza A by PCR NEGATIVE NEGATIVE Final   Influenza B by PCR NEGATIVE NEGATIVE Final    Comment: (NOTE) The Xpert Xpress SARS-CoV-2/FLU/RSV plus assay is intended as an aid in the diagnosis of influenza from Nasopharyngeal swab specimens and should not be used as a sole basis for treatment. Nasal washings and aspirates  are unacceptable for Xpert Xpress SARS-CoV-2/FLU/RSV testing.  Fact Sheet for Patients: BloggerCourse.com  Fact Sheet for Healthcare Providers: SeriousBroker.it  This test is not yet approved or cleared by the [place] FDA and has been authorized for detection and/or diagnosis of SARS-CoV-2 by FDA under an Emergency Use Authorization (EUA). This EUA will remain in effect (meaning this test can be used) for the duration of the COVID-19 declaration under Section 564(b)(1) of the Act, 21 U.S.C. section 360bbb-3(b)(1), unless the authorization is terminated or revoked.     Resp Syncytial Virus by PCR NEGATIVE NEGATIVE Final    Comment: (NOTE) Fact Sheet for Patients: BloggerCourse.com  Fact Sheet for Healthcare Providers: SeriousBroker.it  This test is not yet approved or cleared by the [place] FDA and has been authorized for detection and/or diagnosis of SARS-CoV-2 by FDA under an Emergency Use Authorization (EUA). This EUA will remain in effect (meaning this test can be used) for the duration of the COVID-19 declaration under Section 564(b)(1) of the Act, 21 U.S.C. section 360bbb-3(b)(1), unless the authorization is terminated or revoked.  Performed at [facility], [address] [place], [place] [address]   Resp panel by RT-PCR (RSV, Flu A&B, Covid) Anterior Nasal Swab     Status: None   Collection Time: 06/11/23  1:40 PM   Specimen: Anterior Nasal Swab  Result Value Ref Range Status   SARS Coronavirus 2 by RT PCR NEGATIVE NEGATIVE Final    Comment: (NOTE) SARS-CoV-2 target nucleic acids are NOT DETECTED.  The SARS-CoV-2 RNA is generally detectable in upper respiratory specimens during the acute phase of infection. The lowest concentration of SARS-CoV-2 viral copies this assay can detect is 138 copies/mL. A negative result does not preclude  SARS-Cov-2 infection and should not be used as the sole basis for treatment or other patient management decisions. A negative result may occur with  improper specimen collection/handling, submission of specimen other than nasopharyngeal swab, presence of viral mutation(s) within the areas targeted by this assay, and inadequate number of viral copies(<138 copies/mL). A negative result must be combined with clinical observations, patient history, and epidemiological information. The expected result is Negative.  Fact Sheet for Patients:  BloggerCourse.com  Fact Sheet for Healthcare Providers:  SeriousBroker.it  This test is no t yet approved or cleared by the [place] FDA and  has been authorized for detection and/or diagnosis of SARS-CoV-2 by FDA under an Emergency Use Authorization (EUA). This EUA will remain  in effect (meaning this test can be used) for the duration of the COVID-19 declaration under Section 564(b)(1) of the  Act, 21 U.S.C.section 360bbb-3(b)(1), unless the authorization is terminated  or revoked sooner.       Influenza A by PCR NEGATIVE NEGATIVE Final   Influenza B by PCR NEGATIVE NEGATIVE Final    Comment: (NOTE) The Xpert Xpress SARS-CoV-2/FLU/RSV plus assay is intended as an aid in the diagnosis of influenza from Nasopharyngeal swab specimens and should not be used as a sole basis for treatment. Nasal washings and aspirates are unacceptable for Xpert Xpress SARS-CoV-2/FLU/RSV testing.  Fact Sheet for Patients: BloggerCourse.com  Fact Sheet for Healthcare Providers: SeriousBroker.it  This test is not yet approved or cleared by the [place] FDA and has been authorized for detection and/or diagnosis of SARS-CoV-2 by FDA under an Emergency Use Authorization (EUA). This EUA will remain in effect (meaning this test can be used) for the duration of  the COVID-19 declaration under Section 564(b)(1) of the Act, 21 U.S.C. section 360bbb-3(b)(1), unless the authorization is terminated or revoked.     Resp Syncytial Virus by PCR NEGATIVE NEGATIVE Final    Comment: (NOTE) Fact Sheet for Patients: BloggerCourse.com  Fact Sheet for Healthcare Providers: SeriousBroker.it  This test is not yet approved or cleared by the [place] FDA and has been authorized for detection and/or diagnosis of SARS-CoV-2 by FDA under an Emergency Use Authorization (EUA). This EUA will remain in effect (meaning this test can be used) for the duration of the COVID-19 declaration under Section 564(b)(1) of the Act, 21 U.S.C. section 360bbb-3(b)(1), unless the authorization is terminated or revoked.  Performed at [facility], [address] [place], [place] [address]      Radiology Studies: CT Angio Chest Pulmonary Embolism (PE) W or WO Contrast Result Date: 06/11/2023 CLINICAL DATA:  Pulmonary embolism (PE) suspected, high prob. Shortness of breath EXAM: CT ANGIOGRAPHY CHEST WITH CONTRAST TECHNIQUE: Multidetector CT imaging of the chest was performed using the standard protocol during bolus administration of intravenous contrast. Multiplanar CT image reconstructions and MIPs were obtained to evaluate the vascular anatomy. RADIATION DOSE REDUCTION: This exam was performed according to the departmental dose-optimization program which includes automated exposure control, adjustment of the mA and/or kV according to patient size and/or use of iterative reconstruction technique. CONTRAST:  75mL OMNIPAQUE IOHEXOL 350 MG/ML SOLN COMPARISON:  06/06/2023 FINDINGS: Cardiovascular: Bilateral lower lobe pulmonary emboli are again noted. Overall clot burden similar or slightly decreased since prior study. Small nonocclusive pulmonary embolus within the right middle lobe, not definitively seen on prior  study. Heart is mildly enlarged. Aorta normal caliber. No dissection. Scattered calcifications. Mediastinum/Nodes: Mediastinal and bilateral hilar adenopathy again noted, similar to prior study. Trachea and esophagus are unremarkable. Thyroid unremarkable. Lungs/Pleura: Trace right pleural effusion. Bibasilar dependent atelectasis. Upper Abdomen: No acute findings Musculoskeletal: Chest wall soft tissues are unremarkable. No acute bony abnormality. Review of the MIP images confirms the above findings. IMPRESSION: Stable or slightly improved nonocclusive pulmonary emboli in the lower lobes bilaterally. New small nonocclusive embolus in the right middle lobe. New trace right pleural effusion.  Dependent bibasilar atelectasis. Stable mediastinal and bilateral hilar adenopathy. This appears to be greater than reactive lymph nodes and metastatic adenopathy or lymphoma cannot be excluded. Aortic Atherosclerosis (ICD10-I70.0). Electronically Signed   By: Charlett Nose M.D.   On: 06/11/2023 22:09   DG Chest Port 1 View Result Date: 06/11/2023 CLINICAL DATA:  Shortness of breath.  Productive cough. EXAM: PORTABLE CHEST 1 VIEW COMPARISON:  Radiograph and CT 5 days ago 06/06/2023 FINDINGS: Stable heart size and mediastinal contours.  Right hilar prominence corresponding to adenopathy on prior exam. Stable atelectasis/scarring in the periphery of the right lung base. There may be a trace right pleural effusion. No acute airspace disease. No pulmonary edema. No pneumothorax. Left axillary surgical clips. IMPRESSION: Stable atelectasis/scarring in the periphery of the right lung base. Possible trace right pleural effusion. Electronically Signed   By: Narda Rutherford M.D.   On: 06/11/2023 15:05    Scheduled Meds:  acetaZOLAMIDE  250 mg Oral BID   [START ON 06/14/2023] ipratropium-albuterol  3 mL Nebulization Daily   prednisoLONE acetate  1 drop Right Eye TID   rivaroxaban  15 mg Oral BID WC   Followed by   Melene Muller ON  06/30/2023] rivaroxaban  20 mg Oral Q supper   Continuous Infusions:  doxycycline (VIBRAMYCIN) IV 100 mg (06/13/23 0355)     LOS: 1 day   Burnadette Pop, MD [facility] P2/22/2025, 10:14 AM

## 2023-06-13 NOTE — Plan of Care (Signed)

## 2023-06-13 NOTE — Evaluation (Signed)
 Physical Therapy Evaluation Patient Details Name: Nicole Rush MRN: [id number] DOB: [birth year] Today's Date: 06/13/2023  History of Present Illness  83 yo female admitted with acute resp failure. Recent hospital admission for PE 2/15-2/17. Hx of COVID, breast Ca, CVA, syncope  Clinical Impression  On eval, pt required CGA-Min A for mobility. She walked ~100 feet with a RW. O2 98% on 2L, HR 85 bpm. Pt tolerated activity well. No family present during session. Home with hospice has been recommended by medical team. Will plan to follow pt during this hospital stay. Recommend 24/7 supervision if pt returns home.        If plan is discharge home, recommend the following: Supervision due to cognitive status;A little help with walking and/or transfers;A little help with bathing/dressing/bathroom;Assistance with cooking/housework;Assist for transportation;Help with stairs or ramp for entrance   Can travel by private vehicle        Equipment Recommendations None recommended by PT  Recommendations for Other Services       Functional Status Assessment Patient has had a recent decline in their functional status and demonstrates the ability to make significant improvements in function in a reasonable and predictable amount of time.     Precautions / Restrictions Precautions Precautions: Fall Restrictions Weight Bearing Restrictions Per Provider Order: No      Mobility  Bed Mobility               General bed mobility comments: oob in recliner    Transfers Overall transfer level: Needs assistance Equipment used: Rolling walker (2 wheels) Transfers: Sit to/from Stand Sit to Stand: Min assist           General transfer comment: Cues for hand placement. Assist to rise.    Ambulation/Gait Ambulation/Gait assistance: Contact guard assist Gait Distance (Feet): 100 Feet Assistive device: Rolling walker (2 wheels) Gait Pattern/deviations: Step-through pattern, Decreased  stride length       General Gait Details: No LOB with RW use. Tolerated distance well. Remained on [place] O2-sats 98%. HR 85 bpm  Stairs            Wheelchair Mobility     Tilt Bed    Modified Rankin (Stroke Patients Only)       Balance Overall balance assessment: Needs assistance         Standing balance support: Bilateral upper extremity supported, Reliant on assistive device for balance, During functional activity Standing balance-Leahy Scale: Poor                               Pertinent Vitals/Pain Pain Assessment Pain Assessment: No/denies pain    Home Living Family/patient expects to be discharged to:: Private residence Living Arrangements: Other relatives Available Help at Discharge: Family;Available PRN/intermittently             Home Equipment: Rolling Walker (2 wheels) Additional Comments: pt reluctant to provide home info (perseverated on brother "stealing her house")    Prior Function Prior Level of Function : Patient poor historian/Family not available             Mobility Comments: uses RW?       Extremity/Trunk Assessment   Upper Extremity Assessment Upper Extremity Assessment: Generalized weakness    Lower Extremity Assessment Lower Extremity Assessment: Generalized weakness    Cervical / Trunk Assessment Cervical / Trunk Assessment: Kyphotic  Communication   Communication Communication: No apparent difficulties    Cognition Arousal: Alert Behavior  During Therapy: WFL for tasks assessed/performed   PT - Cognitive impairments: No family/caregiver present to determine baseline                                 Cueing Cueing Techniques: Verbal cues     General Comments      Exercises     Assessment/Plan    PT Assessment Patient needs continued PT services  PT Problem List Decreased strength;Decreased activity tolerance;Decreased balance;Decreased mobility;Decreased cognition;Decreased  knowledge of use of DME;Decreased safety awareness       PT Treatment Interventions DME instruction;Gait training;Functional mobility training;Therapeutic activities;Therapeutic exercise;Balance training;Patient/family education    PT Goals (Current goals can be found in the Care Plan section)  Acute Rehab PT Goals Patient Stated Goal: home PT Goal Formulation: With patient Time For Goal Achievement: 06/27/23 Potential to Achieve Goals: Fair    Frequency Min 1X/week     Co-evaluation               AM-PAC PT "6 Clicks" Mobility  Outcome Measure Help needed turning from your back to your side while in a flat bed without using bedrails?: A Little Help needed moving from lying on your back to sitting on the side of a flat bed without using bedrails?: A Little Help needed moving to and from a bed to a chair (including a wheelchair)?: A Little Help needed standing up from a chair using your arms (e.g., wheelchair or bedside chair)?: A Little Help needed to walk in hospital room?: A Little Help needed climbing 3-5 steps with a railing? : A Lot 6 Click Score: 17    End of Session Equipment Utilized During Treatment: Gait belt Activity Tolerance: Patient tolerated treatment well Patient left: in chair;with call bell/phone within reach;with nursing/sitter in room (sitter in room)   PT Visit Diagnosis: Muscle weakness (generalized) (M62.81);Difficulty in walking, not elsewhere classified (R26.2)    Time: 0981-1914 PT Time Calculation (min) (ACUTE ONLY): 14 min   Charges:   PT Evaluation $PT Eval Low Complexity: 1 Low   PT General Charges $$ ACUTE PT VISIT: 1 Visit           Faye Ramsay, PT Acute Rehabilitation  Office: [phone]

## 2023-06-14 DIAGNOSIS — R599 Enlarged lymph nodes, unspecified: Secondary | ICD-10-CM | POA: Diagnosis not present

## 2023-06-14 DIAGNOSIS — Z711 Person with feared health complaint in whom no diagnosis is made: Secondary | ICD-10-CM

## 2023-06-14 DIAGNOSIS — I2699 Other pulmonary embolism without acute cor pulmonale: Secondary | ICD-10-CM | POA: Diagnosis not present

## 2023-06-14 DIAGNOSIS — Z7189 Other specified counseling: Secondary | ICD-10-CM | POA: Diagnosis not present

## 2023-06-14 DIAGNOSIS — Z789 Other specified health status: Secondary | ICD-10-CM

## 2023-06-14 DIAGNOSIS — Z515 Encounter for palliative care: Secondary | ICD-10-CM

## 2023-06-14 DIAGNOSIS — Z66 Do not resuscitate: Secondary | ICD-10-CM

## 2023-06-14 DIAGNOSIS — J9601 Acute respiratory failure with hypoxia: Secondary | ICD-10-CM | POA: Diagnosis not present

## 2023-06-14 LAB — GLUCOSE, CAPILLARY
Glucose-Capillary: 115 mg/dL — ABNORMAL HIGH (ref 70–99)
Glucose-Capillary: 82 mg/dL (ref 70–99)
Glucose-Capillary: 84 mg/dL (ref 70–99)
Glucose-Capillary: 90 mg/dL (ref 70–99)
Glucose-Capillary: 99 mg/dL (ref 70–99)

## 2023-06-14 MED ORDER — PANTOPRAZOLE SODIUM 40 MG PO TBEC
40.0000 mg | DELAYED_RELEASE_TABLET | Freq: Every day | ORAL | Status: DC
  Administered 2023-06-14 – 2023-06-15 (×2): 40 mg via ORAL
  Filled 2023-06-14 (×2): qty 1

## 2023-06-14 MED ORDER — CALCIUM CARBONATE ANTACID 500 MG PO CHEW
1.0000 | CHEWABLE_TABLET | Freq: Three times a day (TID) | ORAL | Status: DC | PRN
  Administered 2023-06-14: 200 mg via ORAL
  Filled 2023-06-14: qty 1

## 2023-06-14 MED ORDER — PROCHLORPERAZINE EDISYLATE 10 MG/2ML IJ SOLN: 5.0000 mg | Freq: Once | INTRAMUSCULAR | Status: DC | PRN

## 2023-06-14 MED ORDER — IPRATROPIUM-ALBUTEROL 0.5-2.5 (3) MG/3ML IN SOLN
3.0000 mL | Freq: Two times a day (BID) | RESPIRATORY_TRACT | Status: DC
  Administered 2023-06-14 – 2023-06-16 (×5): 3 mL via RESPIRATORY_TRACT
  Filled 2023-06-14 (×6): qty 3

## 2023-06-14 NOTE — Consult Note (Signed)
 Consultation Note Date: 06/14/2023   Patient Name: Nicole Rush  DOB: 1940/07/04  MRN: [id number]  Age / Sex: 83 y.o., female  PCP: Leilani Able, MD Referring Physician: Burnadette Pop, MD  Reason for Consultation: Establishing goals of care, "Elderly patient with multiple commorbidities: breast cancer, bladder cancer. Hospice appropriate.Oncology recommended Hospice"  HPI/Patient Profile: 83 y.o. female  with past medical history of recent diagnosis of pulmonary embolism on Xarelto, history of CVA, history of left breast cancer in remission, asthma, and essential hypertension was admitted on 06/11/2023 with acute hypoxic respiratory failure in setting of recurrent PE, moderate protein calorie malnutrition, mediastinal/hilar lymphadenopathy.   Of note, patient has had 2 admissions and 4 ED visits in the last 6 months. She is a 7 day readmission.  Clinical Assessment and Goals of Care: I have reviewed medical records including EPIC notes, labs, any available advanced directives, and imaging. Received report from primary RN - no acute concerns.    Went to visit patient at bedside - sister/[name], granddaughter/[name], great granddaughter/[name] present. Patient was sitting up in recliner intermittently awake/asleep during visit - she minimally participates in conversation. No signs or non-verbal gestures of pain or discomfort noted. No respiratory distress, increased work of breathing, or secretions noted.   Met with family at bedside as well as HCPOA/Donna via speakerphone  to discuss diagnosis, prognosis, GOC, EOL wishes, disposition, and options.  I introduced Palliative Medicine as specialized medical care for people living with serious illness. It focuses on providing relief from the symptoms and stress of a serious illness. The goal is to improve quality of life for both the patient and the family.  We  discussed a brief life review of the patient as well as functional and nutritional status. Prior to hospitalization, patient was living in a private residence with [name]. While [name] works long shifts, another family member comes and stays with patient. Patient was receiving 24/7 supervision at home. Family started noticing a decline in patient's functional status 2-3 weeks ago. She now is mostly wheelchair bound and requires transfer assistance.  We discussed patient's current illness and what it means in the larger context of patient's on-going co-morbidities. Family have a clear understanding of patient's current acute medical situation. We discussed Oncology recommendation for hospice care - family knew this may be the case. Natural disease trajectory and expectations at EOL were discussed. I attempted to elicit values and goals of care important to the patient. The difference between aggressive medical intervention and comfort care was considered in light of the patient's goals of care.   Provided education and counseling at length on the philosophy and benefits of hospice care. Discussed that it offers a holistic approach to care in the setting of end-stage illness, and is about supporting the patient where they are allowing nature to take it's course. Discussed the hospice team includes RNs, physicians, social workers, and chaplains. They can provide personal care, support for the family, and help keep patient out of the hospital as well as assist with DME needs for home hospice. Education provided on the difference between home vs residential hospice. Family are most interested in inpatient hospice transfer, requesting [facility]. If patient is not approved, they are open to discuss possibility of home hospice; however, family understand patient will require more total care and they are concerned they may not be able to meet her needs.   Advance directives and concepts specific to code status were  considered and discussed. [name] confirms she is patient's  HCPOA.   Encouraged patient/family to consider DNR/DNI status understanding evidenced based poor outcomes in similar hospitalized patient, as the cause of arrest is likely associated with advanced chronic/terminal illness rather than an easily reversible acute cardio-pulmonary event.  I shared that even if we pursued resuscitation we would not able to resolve the underlying factors. I explained that DNR/DNI does not change the medical plan and it only comes into effect after a person has arrested (died).  It is a protective measure to keep [place] from harming the patient in their last moments of life. Family opt for DNR/DNI with understanding that patient would not receive CPR, defibrillation, ACLS medications, or intubation. They indicate patient would want a "peaceful passing when it's her time."  Visit also consisted of discussions dealing with the complex and emotionally intense issues of symptom management and palliative care in the setting of serious and potentially life-threatening illness.   Discussed with patient/family the importance of continued conversation with each other and the medical providers regarding overall plan of care and treatment options, ensuring decisions are within the context of the patient's values and GOCs.    Questions and concerns were addressed. The patient/family was encouraged to call with questions and/or concerns. PMT card was provided.   Primary Decision Maker: HCPOA - daughter/[name]    SUMMARY OF RECOMMENDATIONS   Continue current gentle medical treatment at this time. Will discuss possible transition to full comfort in house tomorrow with family Now DNR/DNI  Family are interested in patient's transfer to inpatient hospice facility, requesting [facility] - TOC and hospice liaison notified; [facility] consult placed If patient does not qualify for inpatient hospice facility, family are open to discuss  home with hospice (family previously already providing patient with 24/7 supervision). They are just worried they may not be able to meet her total care needs PMT will continue to follow and support holistically   Code Status/Advance Care Planning: DNR  Palliative Prophylaxis:  Aspiration, Bowel Regimen, Frequent Pain Assessment, Oral Care, and Turn Reposition  Additional Recommendations (Limitations, Scope, Preferences): No Tracheostomy  Psycho-social/Spiritual:  Desire for further Chaplaincy support:no Created space and opportunity for patient and family to express thoughts and feelings regarding patient's current medical situation.  Emotional support and therapeutic listening provided.  Prognosis:  Poor  Discharge Planning: [facility] vs home with hospice      Primary Diagnoses: Present on Admission:  Acute pulmonary embolism (HCC)  GERD (gastroesophageal reflux disease)  Generalized anxiety disorder  Protein-calorie malnutrition, moderate (HCC)  Hyperlipidemia  Essential hypertension  COPD with chronic bronchitis and emphysema (HCC)  Acute hypoxemic respiratory failure (HCC)  Acute hypoxic respiratory failure (HCC)   I have reviewed the medical record, interviewed the patient and family, and examined the patient. The following aspects are pertinent.  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   Complication of anesthesia    Dyspnea    Hypertension    Stroke (HCC)    per pt no defecits   Social History   Socioeconomic History   Marital status: Widowed    Spouse name: Not on file   Number of children: Not on file   Years of education: Not on file   Highest education level: Not on file  Occupational History   Not on file  Tobacco Use   Smoking status: Former    Passive exposure: Past   Smokeless tobacco: Never  Vaping Use  Vaping status: Never Used  Substance and Sexual Activity   Alcohol use: No    Drug use: No   Sexual activity: Never  Other Topics Concern   Not on file  Social History Narrative   Not on file   Social Drivers of Health   Financial Resource Strain: Not on file  Food Insecurity: No Food Insecurity (06/12/2023)   Hunger Vital Sign    Worried About Running Out of Food in the Last Year: Never true    Ran Out of Food in the Last Year: Never true  Transportation Needs: No Transportation Needs (06/12/2023)   PRAPARE - Administrator, Civil Service (Medical): No    Lack of Transportation (Non-Medical): No  Physical Activity: Not on file  Stress: Not on file  Social Connections: Unknown (06/12/2023)   Social Connection and Isolation Panel [NHANES]    Frequency of Communication with Friends and Family: More than three times a week    Frequency of Social Gatherings with Friends and Family: More than three times a week    Attends Religious Services: 1 to 4 times per year    Active Member of [employer] or Organizations: No    Attends Engineer, structural: 1 to 4 times per year    Marital Status: Patient declined   Family History  Problem Relation Age of Onset   Breast cancer Mother    Lung cancer Father    Scheduled Meds:  acetaZOLAMIDE  250 mg Oral BID   doxycycline  100 mg Oral Q12H   ipratropium-albuterol  3 mL Nebulization BID   polyethylene glycol  17 g Oral Daily   prednisoLONE acetate  1 drop Right Eye TID   rivaroxaban  15 mg Oral BID WC   Followed by   Melene Muller ON 06/30/2023] rivaroxaban  20 mg Oral Q supper   senna-docusate  1 tablet Oral BID   Continuous Infusions: PRN Meds:.albuterol, morphine injection, ondansetron **OR** ondansetron (ZOFRAN) IV Medications Prior to Admission:  Prior to Admission medications   Medication Sig Start Date End Date Taking? Authorizing Provider  acetaminophen (TYLENOL) 500 MG tablet Take 1,000 mg by mouth every 6 (six) hours as needed for mild pain (pain score 1-3) or moderate pain (pain score 4-6).   Yes  [provider]  albuterol (VENTOLIN HFA) 108 (90 Base) MCG/ACT inhaler Inhale 1-2 puffs into the lungs every 4 (four) hours as needed for wheezing or shortness of breath. 05/15/23  Yes [provider]  ibuprofen (ADVIL) 600 MG tablet Take 600 mg by mouth every 6 (six) hours as needed for mild pain (pain score 1-3) or moderate pain (pain score 4-6).   Yes [provider]  OXYGEN Inhale into the lungs as needed (wheezing/SOB).   Yes [provider]  prednisoLONE acetate (PRED FORTE) 1 % ophthalmic suspension Place 1 drop into the right eye 3 (three) times daily.   Yes [provider]  RIVAROXABAN Carlena Hurl) VTE STARTER PACK (15 & 20 MG) Follow package directions: Take one 15mg  tablet by mouth twice a day for 21 days.  On day 22, switch to one 20mg  tablet once a day. Take with food. 06/08/23  Yes Lewie Chamber, MD  acetaZOLAMIDE (DIAMOX) 250 MG tablet Take 1 tablet by mouth 2 (two) times daily. Patient not taking: Reported on 06/12/2023 05/25/23   [provider]  brimonidine (ALPHAGAN) 0.2 % ophthalmic solution Place 1 drop into the right eye 3 (three) times daily. Patient not taking: Reported on  06/12/2023 04/29/23   [provider]  dorzolamide-timolol (COSOPT) 2-0.5 % ophthalmic solution Apply to eye. Patient not taking: Reported on 06/12/2023 04/29/23   [provider]  gabapentin (NEURONTIN) 400 MG capsule Take 400 mg by mouth 3 (three) times daily. Patient not taking: Reported on 06/12/2023 03/04/23   [provider]  latanoprost (XALATAN) 0.005 % ophthalmic solution Apply to eye. Patient not taking: Reported on 06/12/2023 04/29/23   [provider]  rivaroxaban (XARELTO) 20 MG TABS tablet Take 1 tablet (20 mg total) by mouth daily with supper. Start taking after completing the starter pack Patient not taking: Reported on 06/12/2023 07/06/23   Lewie Chamber, MD   No Known Allergies Review of Systems  Unable to perform  ROS: Other    Physical Exam Vitals and nursing note reviewed.  Constitutional:      General: She is not in acute distress.    Appearance: She is ill-appearing.  Pulmonary:     Effort: No respiratory distress.  Skin:    General: Skin is warm and dry.  Neurological:     Motor: Weakness present.     Vital Signs: BP 119/74 (BP Location: Right Arm)   Pulse 84   Temp 98.7 F (37.1 C) (Oral)   Resp 18   Ht 5' (1.524 m)   Wt 43.9 kg   SpO2 99%   BMI 18.89 kg/m  Pain Scale: 0-10   Pain Score: 0-No pain   SpO2: SpO2: 99 % O2 Device:SpO2: 99 % O2 Flow Rate: .O2 Flow Rate (L/min): 2 L/min  IO: Intake/output summary:  Intake/Output Summary (Last 24 hours) at 06/14/2023 1409 Last data filed at 06/14/2023 0841 Gross per 24 hour  Intake 1060 ml  Output --  Net 1060 ml    LBM: Last BM Date : 06/14/23 Baseline Weight: Weight: 43.9 kg Most recent weight: Weight: 43.9 kg     Palliative Assessment/Data: PPS 40-50%     Time In: 1400 Time Out: 1515 Time Total: 75 minutes  Signed by: Haskel Khan, NP   Please contact Palliative Medicine Team phone at [phone] for questions and concerns.  For individual provider: See Amion  *Portions of this note are a verbal dictation therefore any spelling and/or grammatical errors are due to the "[employer]agon [employer] One" system interpretation.

## 2023-06-14 NOTE — TOC Progression Note (Signed)
 Transition of Care [facility]) - Progression Note    Patient Details  Name: Nicole Rush MRN: [id number] Date of Birth: 1940-08-07  [facility] CM/SW Contact  Adrian Prows, RN Phone Number: 06/14/2023, 3:51 PM  Clinical Narrative:    [facility] consult for hospice; also notified by Girard Cooter, NP pt and family are agreeable to hospice, and are interested in patient's transfer to [facility] vs home hospice; pt's dtr [name] given list of residential and home hospice providers; will pass on to oncoming [facility] for follow up in AM/  Expected Discharge Plan: Home w Home Health Services Barriers to Discharge: Continued Medical Work up  Expected Discharge Plan and Services   Discharge Planning Services: CM Consult Post Acute Care Choice: Home Health Living arrangements for the past 2 months: Single Family Home                                       Social Determinants of Health (SDOH) Interventions SDOH Screenings   Food Insecurity: No Food Insecurity (06/12/2023)  Housing: Low Risk  (06/12/2023)  Transportation Needs: No Transportation Needs (06/12/2023)  Utilities: Not At Risk (06/12/2023)  Social Connections: Unknown (06/12/2023)  Tobacco Use: Medium Risk (06/13/2023)    Readmission Risk Interventions     No data to display

## 2023-06-14 NOTE — Progress Notes (Addendum)
 PROGRESS NOTE  Nicole Rush  NWG:[id number] DOB: 02/18/1941 DOA: 06/11/2023 PCP: Leilani Able, MD   Brief Narrative: Patient is 54 female with history of PE on Xarelto, CVA, breast cancer in remission, hypertension who presented from home with complaint of shortness of breath.  She was just discharged from here on 2/17 after she was admitted for PE .patient compliant with Xarelto.  CT angiogram done here on admission showed small new PE and slight worsening of previous PE, lymphadenopathy.  Required 6 L of oxygen per minute on arrival, now on 2 L of oxygen which is her baseline.  Admitted for the management of acute hypoxic respiratory secondary to current PE.  Oncology, palliative care consulted.  Continue goals of care discussion.  Assessment & Plan:  Principal Problem:   Acute hypoxemic respiratory failure (HCC) Active Problems:   Acute pulmonary embolism (HCC)   Malignant neoplasm of upper-outer quadrant of left breast in female, estrogen receptor positive (HCC)   GERD (gastroesophageal reflux disease)   Generalized anxiety disorder   Protein-calorie malnutrition, moderate (HCC)   Hyperlipidemia   Essential hypertension   COPD with chronic bronchitis and emphysema (HCC)   Acute hypoxic respiratory failure (HCC)   Mediastinal/hilar lymphadenopathy/history of bladder cancer/Goals of care: Similar to her last admission, CT scan showed stable mediastinal and bilateral hilar adenopathy.  Possible metastatic adenopathy or lymphoma .  Oncology following here.  She also has history of bladder cancer.  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   She follows with Dr. Liliane Shi, urology. 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  Palliative care consulted.  Recurrent  acute PE: Patient was admitted on 2/15 and was found to have PE at the time.  Presented with dyspnea again.  Repeat CT shows stable or slightly improved nonocclusive pulmonary emboli in the lower lobes bilaterally.New small nonocclusive embolus in the right middle lobe.  Currently on 2 L of oxygen. Echo done few days ago showed EF of 75%, grade 1 diastolic dysfunction, no right heart strain.  PE was thought to be secondary to immobility.  Xarelto resumed.  Chronic hypoxic respiratory failure: On last admission, she required oxygen on discharge and has been on 3 L of oxygen per minute at home.  Currently on 2L   History of breast cancer: Status post lumpectomy in 2020, no longer on anastrozole.   Iron is anemia: On iron supplementation  COPD: Currently not on exacerbation.  GAD: Continue home regimen  Hyperlipidemia: On statin  Hypertension: Does not take any medication at home        DVT prophylaxis: Rivaroxaban (XARELTO) tablet 15 mg  rivaroxaban (XARELTO) tablet 20 mg     Code Status: Full Code  Family Communication: Called and discussed with daughter [name] on phone on 2/23  Patient status: Obs  Patient is from : Home   Anticipated discharge VH:QION with hospice ?  Estimated DC date:likely tomorrow, awaiting goals of care discussion by palliative care, oncology follow-up   Consultants: Oncology  Procedures:None  Antimicrobials:  Anti-infectives (From admission, onward)    Start     Dose/Rate Route Frequency Ordered Stop   06/13/23 1800  doxycycline (VIBRA-TABS) tablet 100 mg        100 mg Oral Every 12 hours 06/13/23 1026 06/18/23 2159   06/12/23 0300  doxycycline (VIBRAMYCIN) 100 mg in sodium chloride  0.9 % 250 mL IVPB  Status:  Discontinued        100 mg 125 mL/hr over 120 Minutes Intravenous Every 12 hours 06/12/23 0245 06/13/23 1026       Subjective:  Patient seen and examined at bedside today.  Hemodynamically stable.  Comfortable.  Sitting on the  chair.  Not in any kind of respiratory distress.  On 2 L of oxygen.  Alert ,oriented.  Denies new complaints.   Objective: Vitals:   06/14/23 0000 06/14/23 0420 06/14/23 0712 06/14/23 0959  BP: 129/84 138/80    Pulse: 87 84    Resp: 19 19    Temp: 99 F (37.2 C) 98.9 F (37.2 C)    TempSrc: Oral Oral    SpO2: 100%   95%  Weight:   43.9 kg   Height:   5' (1.524 m)     Intake/Output Summary (Last 24 hours) at 06/14/2023 1013 Last data filed at 06/14/2023 0841 Gross per 24 hour  Intake 1060 ml  Output --  Net 1060 ml   Filed Weights   06/14/23 1610  Weight: 43.9 kg    Examination: General exam: Overall comfortable, not in distress, pleasant elderly female HEENT: PERRL Respiratory system:  no wheezes or crackles, diminished sounds in bases Cardiovascular system: S1 & S2 heard, RRR.  Gastrointestinal system: Abdomen is nondistended, soft and nontender. Central nervous system: Alert and oriented Extremities: No edema, no clubbing ,no cyanosis Skin: No rashes, no ulcers,no icterus     Data Reviewed: I have personally reviewed following labs and imaging studies  CBC: Recent Labs  Lab 06/11/23 1327 06/11/23 2111 06/12/23 0618 06/13/23 0816  WBC 9.3  --  9.9 8.7  HGB 7.9* 7.8* 9.6* 10.4*  HCT 25.1* 23.0* 30.7* 32.3*  MCV 78.2*  --  80.6 79.8*  PLT 343  --  325 334   Basic Metabolic Panel: Recent Labs  Lab 06/11/23 2111 06/12/23 0618 06/13/23 0816  NA 139 137 136  K 3.6 3.5 3.8  CL 104 105 104  CO2  --  21* 21*  GLUCOSE 92 69* 86  BUN 15 13 14   CREATININE 0.70 0.57 0.72  CALCIUM  --  7.5* 8.9     Recent Results (from the past 240 hours)  Resp panel by RT-PCR (RSV, Flu A&B, Covid) Anterior Nasal Swab     Status: None   Collection Time: 06/06/23  7:33 AM   Specimen: Anterior Nasal Swab  Result Value Ref Range Status   SARS Coronavirus 2 by RT PCR NEGATIVE NEGATIVE Final    Comment: (NOTE) SARS-CoV-2 target nucleic acids are NOT DETECTED.  The  SARS-CoV-2 RNA is generally detectable in upper respiratory specimens during the acute phase of infection. The lowest concentration of SARS-CoV-2 viral copies this assay can detect is 138 copies/mL. A negative result does not preclude SARS-Cov-2 infection and should not be used as the sole basis for treatment or other patient management decisions. A negative result may occur with  improper specimen collection/handling, submission of specimen other than nasopharyngeal swab, presence of viral mutation(s) within the areas targeted by this assay, and inadequate number of viral copies(<138 copies/mL). A negative result must be combined with clinical observations, patient history, and epidemiological information. The expected result is Negative.  Fact Sheet for Patients:  BloggerCourse.com  Fact Sheet for Healthcare Providers:  SeriousBroker.it  This test is no t yet approved or cleared by the [place] FDA and  has been authorized for detection and/or diagnosis  of SARS-CoV-2 by FDA under an Emergency Use Authorization (EUA). This EUA will remain  in effect (meaning this test can be used) for the duration of the COVID-19 declaration under Section 564(b)(1) of the Act, 21 U.S.C.section 360bbb-3(b)(1), unless the authorization is terminated  or revoked sooner.       Influenza A by PCR NEGATIVE NEGATIVE Final   Influenza B by PCR NEGATIVE NEGATIVE Final    Comment: (NOTE) The Xpert Xpress SARS-CoV-2/FLU/RSV plus assay is intended as an aid in the diagnosis of influenza from Nasopharyngeal swab specimens and should not be used as a sole basis for treatment. Nasal washings and aspirates are unacceptable for Xpert Xpress SARS-CoV-2/FLU/RSV testing.  Fact Sheet for Patients: BloggerCourse.com  Fact Sheet for Healthcare Providers: SeriousBroker.it  This test is not yet approved or  cleared by the [place] FDA and has been authorized for detection and/or diagnosis of SARS-CoV-2 by FDA under an Emergency Use Authorization (EUA). This EUA will remain in effect (meaning this test can be used) for the duration of the COVID-19 declaration under Section 564(b)(1) of the Act, 21 U.S.C. section 360bbb-3(b)(1), unless the authorization is terminated or revoked.     Resp Syncytial Virus by PCR NEGATIVE NEGATIVE Final    Comment: (NOTE) Fact Sheet for Patients: BloggerCourse.com  Fact Sheet for Healthcare Providers: SeriousBroker.it  This test is not yet approved or cleared by the [place] FDA and has been authorized for detection and/or diagnosis of SARS-CoV-2 by FDA under an Emergency Use Authorization (EUA). This EUA will remain in effect (meaning this test can be used) for the duration of the COVID-19 declaration under Section 564(b)(1) of the Act, 21 U.S.C. section 360bbb-3(b)(1), unless the authorization is terminated or revoked.  Performed at [facility], [address], [place], [place] [address]   Resp panel by RT-PCR (RSV, Flu A&B, Covid) Anterior Nasal Swab     Status: None   Collection Time: 06/11/23  1:40 PM   Specimen: Anterior Nasal Swab  Result Value Ref Range Status   SARS Coronavirus 2 by RT PCR NEGATIVE NEGATIVE Final    Comment: (NOTE) SARS-CoV-2 target nucleic acids are NOT DETECTED.  The SARS-CoV-2 RNA is generally detectable in upper respiratory specimens during the acute phase of infection. The lowest concentration of SARS-CoV-2 viral copies this assay can detect is 138 copies/mL. A negative result does not preclude SARS-Cov-2 infection and should not be used as the sole basis for treatment or other patient management decisions. A negative result may occur with  improper specimen collection/handling, submission of specimen other than nasopharyngeal swab,  presence of viral mutation(s) within the areas targeted by this assay, and inadequate number of viral copies(<138 copies/mL). A negative result must be combined with clinical observations, patient history, and epidemiological information. The expected result is Negative.  Fact Sheet for Patients:  BloggerCourse.com  Fact Sheet for Healthcare Providers:  SeriousBroker.it  This test is no t yet approved or cleared by the [place] FDA and  has been authorized for detection and/or diagnosis of SARS-CoV-2 by FDA under an Emergency Use Authorization (EUA). This EUA will remain  in effect (meaning this test can be used) for the duration of the COVID-19 declaration under Section 564(b)(1) of the Act, 21 U.S.C.section 360bbb-3(b)(1), unless the authorization is terminated  or revoked sooner.       Influenza A by PCR NEGATIVE NEGATIVE Final   Influenza B by PCR NEGATIVE NEGATIVE Final    Comment: (NOTE) The Xpert Xpress SARS-CoV-2/FLU/RSV  plus assay is intended as an aid in the diagnosis of influenza from Nasopharyngeal swab specimens and should not be used as a sole basis for treatment. Nasal washings and aspirates are unacceptable for Xpert Xpress SARS-CoV-2/FLU/RSV testing.  Fact Sheet for Patients: BloggerCourse.com  Fact Sheet for Healthcare Providers: SeriousBroker.it  This test is not yet approved or cleared by the [place] FDA and has been authorized for detection and/or diagnosis of SARS-CoV-2 by FDA under an Emergency Use Authorization (EUA). This EUA will remain in effect (meaning this test can be used) for the duration of the COVID-19 declaration under Section 564(b)(1) of the Act, 21 U.S.C. section 360bbb-3(b)(1), unless the authorization is terminated or revoked.     Resp Syncytial Virus by PCR NEGATIVE NEGATIVE Final    Comment: (NOTE) Fact Sheet for  Patients: BloggerCourse.com  Fact Sheet for Healthcare Providers: SeriousBroker.it  This test is not yet approved or cleared by the [place] FDA and has been authorized for detection and/or diagnosis of SARS-CoV-2 by FDA under an Emergency Use Authorization (EUA). This EUA will remain in effect (meaning this test can be used) for the duration of the COVID-19 declaration under Section 564(b)(1) of the Act, 21 U.S.C. section 360bbb-3(b)(1), unless the authorization is terminated or revoked.  Performed at [facility], [address], [place], [place] [address]      Radiology Studies: No results found.   Scheduled Meds:  acetaZOLAMIDE  250 mg Oral BID   doxycycline  100 mg Oral Q12H   ipratropium-albuterol  3 mL Nebulization BID   polyethylene glycol  17 g Oral Daily   prednisoLONE acetate  1 drop Right Eye TID   rivaroxaban  15 mg Oral BID WC   Followed by   Melene Muller ON 06/30/2023] rivaroxaban  20 mg Oral Q supper   senna-docusate  1 tablet Oral BID   Continuous Infusions:     LOS: 1 day   Burnadette Pop, MD [facility] P2/23/2025, 10:13 AM

## 2023-06-15 DIAGNOSIS — Z515 Encounter for palliative care: Secondary | ICD-10-CM | POA: Diagnosis not present

## 2023-06-15 DIAGNOSIS — I2699 Other pulmonary embolism without acute cor pulmonale: Secondary | ICD-10-CM | POA: Diagnosis not present

## 2023-06-15 DIAGNOSIS — R599 Enlarged lymph nodes, unspecified: Secondary | ICD-10-CM | POA: Diagnosis not present

## 2023-06-15 DIAGNOSIS — Z7189 Other specified counseling: Secondary | ICD-10-CM | POA: Diagnosis not present

## 2023-06-15 DIAGNOSIS — J9601 Acute respiratory failure with hypoxia: Secondary | ICD-10-CM | POA: Diagnosis not present

## 2023-06-15 LAB — BASIC METABOLIC PANEL
Anion gap: 9 (ref 5–15)
BUN: 24 mg/dL — ABNORMAL HIGH (ref 8–23)
CO2: 16 mmol/L — ABNORMAL LOW (ref 22–32)
Calcium: 8.8 mg/dL — ABNORMAL LOW (ref 8.9–10.3)
Chloride: 110 mmol/L (ref 98–111)
Creatinine, Ser: 0.97 mg/dL (ref 0.44–1.00)
GFR, Estimated: 58 mL/min — ABNORMAL LOW (ref >60)
Glucose, Bld: 91 mg/dL (ref 70–99)
Potassium: 4 mmol/L (ref 3.5–5.1)
Sodium: 135 mmol/L (ref 135–145)

## 2023-06-15 LAB — CBC
HCT: 36.2 % (ref 36.0–46.0)
Hemoglobin: 11 g/dL — ABNORMAL LOW (ref 12.0–15.0)
MCH: 25.7 pg — ABNORMAL LOW (ref 26.0–34.0)
MCHC: 30.4 g/dL (ref 30.0–36.0)
MCV: 84.6 fL (ref 80.0–100.0)
Platelets: 355 10*3/uL (ref 150–400)
RBC: 4.28 MIL/uL (ref 3.87–5.11)
RDW: 22.3 % — ABNORMAL HIGH (ref 11.5–15.5)
WBC: 9.4 10*3/uL (ref 4.0–10.5)
nRBC: 0 % (ref 0.0–0.2)

## 2023-06-15 LAB — GLUCOSE, CAPILLARY: Glucose-Capillary: 120 mg/dL — ABNORMAL HIGH (ref 70–99)

## 2023-06-15 MED ORDER — ACETAMINOPHEN 325 MG PO TABS: 650.0000 mg | ORAL_TABLET | Freq: Four times a day (QID) | ORAL | Status: DC | PRN

## 2023-06-15 MED ORDER — ACETAMINOPHEN 650 MG RE SUPP: 650.0000 mg | Freq: Four times a day (QID) | RECTAL | Status: DC | PRN

## 2023-06-15 MED ORDER — LORAZEPAM 1 MG PO TABS: 1.0000 mg | ORAL_TABLET | ORAL | Status: DC | PRN

## 2023-06-15 MED ORDER — ONDANSETRON 4 MG PO TBDP
4.0000 mg | ORAL_TABLET | Freq: Four times a day (QID) | ORAL | Status: DC | PRN
Start: 2023-06-15 — End: 2023-06-17

## 2023-06-15 MED ORDER — GLYCOPYRROLATE 0.2 MG/ML IJ SOLN: 0.2000 mg | INTRAMUSCULAR | Status: DC | PRN

## 2023-06-15 MED ORDER — MORPHINE SULFATE (CONCENTRATE) 10 MG /0.5 ML PO SOLN
5.0000 mg | ORAL | Status: DC | PRN
Start: 2023-06-15 — End: 2023-06-17

## 2023-06-15 MED ORDER — LORAZEPAM 2 MG/ML IJ SOLN: 1.0000 mg | INTRAMUSCULAR | Status: DC | PRN

## 2023-06-15 MED ORDER — ONDANSETRON HCL 4 MG/2ML IJ SOLN: 4.0000 mg | Freq: Four times a day (QID) | INTRAMUSCULAR | Status: DC | PRN

## 2023-06-15 MED ORDER — HALOPERIDOL LACTATE 2 MG/ML PO CONC: 2.0000 mg | Freq: Four times a day (QID) | ORAL | Status: DC | PRN

## 2023-06-15 MED ORDER — BIOTENE DRY MOUTH MT LIQD
15.0000 mL | Freq: Two times a day (BID) | OROMUCOSAL | Status: DC
  Administered 2023-06-15 – 2023-06-16 (×2): 15 mL via TOPICAL

## 2023-06-15 MED ORDER — HALOPERIDOL 2 MG PO TABS: 2.0000 mg | ORAL_TABLET | Freq: Four times a day (QID) | ORAL | Status: DC | PRN

## 2023-06-15 MED ORDER — LORAZEPAM 2 MG/ML PO CONC: 1.0000 mg | ORAL | Status: DC | PRN

## 2023-06-15 MED ORDER — HALOPERIDOL LACTATE 5 MG/ML IJ SOLN: 2.0000 mg | Freq: Four times a day (QID) | INTRAMUSCULAR | Status: DC | PRN

## 2023-06-15 MED ORDER — POLYVINYL ALCOHOL 1.4 % OP SOLN: 1.0000 [drp] | Freq: Four times a day (QID) | OPHTHALMIC | Status: DC | PRN

## 2023-06-15 MED ORDER — GLYCOPYRROLATE 1 MG PO TABS: 1.0000 mg | ORAL_TABLET | ORAL | Status: DC | PRN

## 2023-06-15 MED ORDER — DIPHENHYDRAMINE HCL 50 MG/ML IJ SOLN: 25.0000 mg | INTRAMUSCULAR | Status: DC | PRN

## 2023-06-15 NOTE — Plan of Care (Signed)

## 2023-06-15 NOTE — Progress Notes (Signed)
 PROGRESS NOTE  Nicole Rush  ZOX:[id number] DOB: 08-24-1940 DOA: 06/11/2023 PCP: Leilani Able, MD   Brief Narrative: Patient is 56 female with history of PE on Xarelto, CVA, breast cancer in remission, hypertension who presented from home with complaint of shortness of breath.  She was just discharged from here on 2/17 after she was admitted for PE .patient compliant with Xarelto.  CT angiogram done here on admission showed small new PE and slight worsening of previous PE, lymphadenopathy.  Required 6 L of oxygen per minute on arrival, now on 2 L of oxygen which is her baseline.  Admitted for the management of acute hypoxic respiratory secondary to current PE.  Oncology, palliative care consulted.  After discussion with family, decision made to transition her care to comfort.  Plan for discharge home tomorrow with hospice  Assessment & Plan:  Principal Problem:   Acute hypoxemic respiratory failure (HCC) Active Problems:   Acute pulmonary embolism (HCC)   Malignant neoplasm of upper-outer quadrant of left breast in female, estrogen receptor positive (HCC)   GERD (gastroesophageal reflux disease)   Generalized anxiety disorder   Protein-calorie malnutrition, moderate (HCC)   Hyperlipidemia   Essential hypertension   COPD with chronic bronchitis and emphysema (HCC)   Acute hypoxic respiratory failure (HCC)   Mediastinal/hilar lymphadenopathy/history of bladder cancer/Goals of care: Similar to her last admission, CT scan showed stable mediastinal and bilateral hilar adenopathy.  Possible metastatic adenopathy or lymphoma .  Oncology was following here.  She also has history of bladder cancer.  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   She follows with Dr. Liliane Shi, urology. 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  Plan for discharge home tomorrow with hospice  Recurrent acute PE: Patient was admitted on 2/15 and was found to have PE at the time.  Presented with dyspnea again.  Repeat CT shows stable or slightly improved nonocclusive pulmonary emboli in the lower lobes bilaterally.New small nonocclusive embolus in the right middle lobe.  Currently on 2 L of oxygen. Echo done few days ago showed EF of 75%, grade 1 diastolic dysfunction, no right heart strain.  PE was thought to be secondary to immobility.  Xarelto resumed.  Chronic hypoxic respiratory failure: On last admission, she required oxygen on discharge and has been on 3 L of oxygen per minute at home.  Currently on 2L   History of breast cancer: Status post lumpectomy in 2020, no longer on anastrozole.    Other past medical problems:  Iron deficiency anemia,COPD, anxiety disorder, hyperlipidemia, hypertension         DVT prophylaxis: Rivaroxaban (XARELTO) tablet 15 mg  rivaroxaban (XARELTO) tablet 20 mg     Code Status: Do not attempt resuscitation (DNR) - Comfort care  Family Communication: Called and discussed with daughter [name] on phone on 2/23  Patient status: Obs  Patient is from : Home   Anticipated discharge WJ:XBJY with hospice tomorrow  Estimated DC date: Tomorrow, awaiting delivery of DME  Consultants: Oncology, palliative care  Procedures:None  Antimicrobials:  Anti-infectives (From admission, onward)    Start     Dose/Rate Route Frequency Ordered Stop   06/13/23 1800  doxycycline (VIBRA-TABS) tablet 100 mg  Status:  Discontinued        100 mg Oral Every 12 hours 06/13/23 1026  06/15/23 0951   06/12/23 0300  doxycycline (VIBRAMYCIN) 100 mg in sodium chloride 0.9 % 250 mL IVPB  Status:  Discontinued        100 mg 125 mL/hr over 120 Minutes Intravenous Every 12 hours 06/12/23 0245 06/13/23 1026       Subjective:  Patient seen and examined at  bedside today.  She was comfortably sitting in the chair.  Denied any shortness of breath or cough.  Alert  and oriented.On 2 L per minute  Objective: Vitals:   06/14/23 0959 06/14/23 1318 06/14/23 2115 06/15/23 0550  BP:  119/74 124/83 108/72  Pulse:  84 89 80  Resp:  18 20 20   Temp:  98.7 F (37.1 C) 98.6 F (37 C) 98.6 F (37 C)  TempSrc:  Oral Oral Oral  SpO2: 95% 99% 98% 100%  Weight:      Height:        Intake/Output Summary (Last 24 hours) at 06/15/2023 1113 Last data filed at 06/15/2023 1053 Gross per 24 hour  Intake 220 ml  Output 650 ml  Net -430 ml   Filed Weights   06/14/23 1610  Weight: 43.9 kg    Examination:  General exam: Overall comfortable, not in distress, chronically deconditioned pleasant elderly  female HEENT: PERRL Respiratory system:  no wheezes or crackles , diminished sounds in bases Cardiovascular system: S1 & S2 heard, RRR.  Gastrointestinal system: Abdomen is nondistended, soft and nontender. Central nervous system: Alert and oriented Extremities: No edema, no clubbing ,no cyanosis Skin: No rashes, no ulcers,no icterus     Data Reviewed: I have personally reviewed following labs and imaging studies  CBC: Recent Labs  Lab 06/11/23 1327 06/11/23 2111 06/12/23 0618 06/13/23 0816 06/15/23 0722  WBC 9.3  --  9.9 8.7 9.4  HGB 7.9* 7.8* 9.6* 10.4* 11.0*  HCT 25.1* 23.0* 30.7* 32.3* 36.2  MCV 78.2*  --  80.6 79.8* 84.6  PLT 343  --  325 334 355   Basic Metabolic Panel: Recent Labs  Lab 06/11/23 2111 06/12/23 0618 06/13/23 0816 06/15/23 0458  NA 139 137 136 135  K 3.6 3.5 3.8 4.0  CL 104 105 104 110  CO2  --  21* 21* 16*  GLUCOSE 92 69* 86 91  BUN 15 13 14  24*  CREATININE 0.70 0.57 0.72 0.97  CALCIUM  --  7.5* 8.9 8.8*     Recent Results (from the past 240 hours)  Resp panel by RT-PCR (RSV, Flu A&B, Covid) Anterior Nasal Swab     Status: None   Collection Time: 06/06/23  7:33 AM   Specimen: Anterior Nasal Swab  Result  Value Ref Range Status   SARS Coronavirus 2 by RT PCR NEGATIVE NEGATIVE Final    Comment: (NOTE) SARS-CoV-2 target nucleic acids are NOT DETECTED.  The SARS-CoV-2 RNA is generally detectable in upper respiratory specimens during the acute phase of infection. The lowest concentration of SARS-CoV-2 viral copies this assay can detect is 138 copies/mL. A negative result does not preclude SARS-Cov-2 infection and should not be used as the sole basis for treatment or other patient management decisions. A negative result may occur with  improper specimen collection/handling, submission of specimen other than nasopharyngeal swab, presence of viral mutation(s) within the areas targeted by this assay, and inadequate number of viral copies(<138 copies/mL). A negative result must be combined with clinical observations, patient history, and epidemiological information. The expected result is Negative.  Fact Sheet for Patients:  BloggerCourse.com  Fact  Sheet for Healthcare Providers:  SeriousBroker.it  This test is no t yet approved or cleared by the [place] FDA and  has been authorized for detection and/or diagnosis of SARS-CoV-2 by FDA under an Emergency Use Authorization (EUA). This EUA will remain  in effect (meaning this test can be used) for the duration of the COVID-19 declaration under Section 564(b)(1) of the Act, 21 U.S.C.section 360bbb-3(b)(1), unless the authorization is terminated  or revoked sooner.       Influenza A by PCR NEGATIVE NEGATIVE Final   Influenza B by PCR NEGATIVE NEGATIVE Final    Comment: (NOTE) The Xpert Xpress SARS-CoV-2/FLU/RSV plus assay is intended as an aid in the diagnosis of influenza from Nasopharyngeal swab specimens and should not be used as a sole basis for treatment. Nasal washings and aspirates are unacceptable for Xpert Xpress SARS-CoV-2/FLU/RSV testing.  Fact Sheet for  Patients: BloggerCourse.com  Fact Sheet for Healthcare Providers: SeriousBroker.it  This test is not yet approved or cleared by the [place] FDA and has been authorized for detection and/or diagnosis of SARS-CoV-2 by FDA under an Emergency Use Authorization (EUA). This EUA will remain in effect (meaning this test can be used) for the duration of the COVID-19 declaration under Section 564(b)(1) of the Act, 21 U.S.C. section 360bbb-3(b)(1), unless the authorization is terminated or revoked.     Resp Syncytial Virus by PCR NEGATIVE NEGATIVE Final    Comment: (NOTE) Fact Sheet for Patients: BloggerCourse.com  Fact Sheet for Healthcare Providers: SeriousBroker.it  This test is not yet approved or cleared by the [place] FDA and has been authorized for detection and/or diagnosis of SARS-CoV-2 by FDA under an Emergency Use Authorization (EUA). This EUA will remain in effect (meaning this test can be used) for the duration of the COVID-19 declaration under Section 564(b)(1) of the Act, 21 U.S.C. section 360bbb-3(b)(1), unless the authorization is terminated or revoked.  Performed at [facility], [address], [place], [place] [address]   Resp panel by RT-PCR (RSV, Flu A&B, Covid) Anterior Nasal Swab     Status: None   Collection Time: 06/11/23  1:40 PM   Specimen: Anterior Nasal Swab  Result Value Ref Range Status   SARS Coronavirus 2 by RT PCR NEGATIVE NEGATIVE Final    Comment: (NOTE) SARS-CoV-2 target nucleic acids are NOT DETECTED.  The SARS-CoV-2 RNA is generally detectable in upper respiratory specimens during the acute phase of infection. The lowest concentration of SARS-CoV-2 viral copies this assay can detect is 138 copies/mL. A negative result does not preclude SARS-Cov-2 infection and should not be used as the sole basis for treatment  or other patient management decisions. A negative result may occur with  improper specimen collection/handling, submission of specimen other than nasopharyngeal swab, presence of viral mutation(s) within the areas targeted by this assay, and inadequate number of viral copies(<138 copies/mL). A negative result must be combined with clinical observations, patient history, and epidemiological information. The expected result is Negative.  Fact Sheet for Patients:  BloggerCourse.com  Fact Sheet for Healthcare Providers:  SeriousBroker.it  This test is no t yet approved or cleared by the [place] FDA and  has been authorized for detection and/or diagnosis of SARS-CoV-2 by FDA under an Emergency Use Authorization (EUA). This EUA will remain  in effect (meaning this test can be used) for the duration of the COVID-19 declaration under Section 564(b)(1) of the Act, 21 U.S.C.section 360bbb-3(b)(1), unless the authorization is terminated  or revoked sooner.  Influenza A by PCR NEGATIVE NEGATIVE Final   Influenza B by PCR NEGATIVE NEGATIVE Final    Comment: (NOTE) The Xpert Xpress SARS-CoV-2/FLU/RSV plus assay is intended as an aid in the diagnosis of influenza from Nasopharyngeal swab specimens and should not be used as a sole basis for treatment. Nasal washings and aspirates are unacceptable for Xpert Xpress SARS-CoV-2/FLU/RSV testing.  Fact Sheet for Patients: BloggerCourse.com  Fact Sheet for Healthcare Providers: SeriousBroker.it  This test is not yet approved or cleared by the [place] FDA and has been authorized for detection and/or diagnosis of SARS-CoV-2 by FDA under an Emergency Use Authorization (EUA). This EUA will remain in effect (meaning this test can be used) for the duration of the COVID-19 declaration under Section 564(b)(1) of the Act, 21 U.S.C. section  360bbb-3(b)(1), unless the authorization is terminated or revoked.     Resp Syncytial Virus by PCR NEGATIVE NEGATIVE Final    Comment: (NOTE) Fact Sheet for Patients: BloggerCourse.com  Fact Sheet for Healthcare Providers: SeriousBroker.it  This test is not yet approved or cleared by the [place] FDA and has been authorized for detection and/or diagnosis of SARS-CoV-2 by FDA under an Emergency Use Authorization (EUA). This EUA will remain in effect (meaning this test can be used) for the duration of the COVID-19 declaration under Section 564(b)(1) of the Act, 21 U.S.C. section 360bbb-3(b)(1), unless the authorization is terminated or revoked.  Performed at [facility], [address], [place], [place] [address]      Radiology Studies: No results found.   Scheduled Meds:  acetaZOLAMIDE  250 mg Oral BID   antiseptic oral rinse  15 mL Topical BID   ipratropium-albuterol  3 mL Nebulization BID   polyethylene glycol  17 g Oral Daily   prednisoLONE acetate  1 drop Right Eye TID   rivaroxaban  15 mg Oral BID WC   Followed by   Melene Muller ON 06/30/2023] rivaroxaban  20 mg Oral Q supper   senna-docusate  1 tablet Oral BID   Continuous Infusions:     LOS: 0 days   Burnadette Pop, MD [facility] P2/24/2025, 11:13 AM

## 2023-06-15 NOTE — Progress Notes (Signed)
 Daily Progress Note   Patient Name: Nicole Rush       Date: 06/15/2023 DOB: 09-23-1940  Age: 83 y.o. MRN#: [id number] Attending Physician: Burnadette Pop, MD Primary Care Physician: Leilani Able, MD Admit Date: 06/11/2023  Reason for Consultation/Follow-up: Establishing goals of care  Subjective: I have reviewed medical records including EPIC notes, MAR, any available advanced directives as necessary, and labs. Received report from primary RN - no acute concerns.  Went to visit patient at bedside - daughter/Donna present. Patient was sitting up in chair awake, alert, and able to participate moderately in conversation. No signs or non-verbal gestures of pain or discomfort noted. No respiratory distress, increased work of breathing, or secretions noted.   Emotional support provided to patient and [name]. Therapeutic listening provided as [name] reflects on information discussed yesterday - she is still interested in [facility] evaluation and open to discuss home hospice if not approved.  We talked about transition to comfort measures in house and what that would entail inclusive of medications to control pain, dyspnea, agitation, nausea, and itching. We discussed stopping all unnecessary measures such as blood draws, needle sticks, oxygen, antibiotics, CBGs/insulin, cardiac monitoring, IVF, and frequent vital signs. Education provided that other non-pharmacological interventions would be utilized for holistic support and comfort such as spiritual support if requested, repositioning, music therapy, offering comfort feeds, and/or therapeutic listening. All care would focus on how the patient is looking and feeling. [name] opts for patient's transition to full comfort measures today.  [name] is still  hopeful to speak with the Oncologist today - will notify Dr. Pamelia Hoit that she is at bedside and/or available by phone per her request.  All questions and concerns addressed. Encouraged to call with questions and/or concerns. PMT card previously provided.  11:00 AM Received updates from [employer] - after their discussion plan is for patient to return home with hospice. They will order DME today and plan for discharge tomorrow.  12:30 PM Received notification daughter/Donna called PMT phone with questions.  1:00 PM Returned Donna's call - she would like to speak with someone that can give her more information on how to start Medicaid application for patient. Informed TOC of her request.  Length of Stay: 0  Current Medications: Scheduled Meds:   acetaZOLAMIDE  250 mg Oral BID   doxycycline  100 mg Oral Q12H   ipratropium-albuterol  3 mL Nebulization BID   pantoprazole  40 mg Oral Daily   polyethylene glycol  17 g Oral Daily   prednisoLONE acetate  1 drop Right Eye TID   rivaroxaban  15 mg Oral BID WC   Followed by   Melene Muller ON 06/30/2023] rivaroxaban  20 mg Oral Q supper   senna-docusate  1 tablet Oral BID    Continuous Infusions:   PRN Meds: albuterol, calcium carbonate, morphine injection, ondansetron **OR** ondansetron (ZOFRAN) IV, prochlorperazine  Physical Exam Vitals and nursing note reviewed.  Constitutional:      General: She is not in acute distress. Pulmonary:     Effort: No respiratory distress.  Skin:    General: Skin is warm and dry.  Neurological:     Mental Status: She is alert.     Motor: Weakness present.  Psychiatric:        Attention and Perception: Attention normal.        Behavior: Behavior is cooperative.             Vital Signs: BP 108/72 (BP Location: Left Arm)   Pulse 80   Temp 98.6 F (37 C) (Oral)   Resp 20   Ht 5' (1.524 m)   Wt 43.9 kg   SpO2 100%   BMI 18.89 kg/m  SpO2: SpO2: 100 % O2 Device: O2 Device: Nasal Cannula O2  Flow Rate: O2 Flow Rate (L/min): 2 L/min  Intake/output summary:  Intake/Output Summary (Last 24 hours) at 06/15/2023 0950 Last data filed at 06/15/2023 0900 Gross per 24 hour  Intake 220 ml  Output 300 ml  Net -80 ml   LBM: Last BM Date : 06/14/23 Baseline Weight: Weight: 43.9 kg Most recent weight: Weight: 43.9 kg       Palliative Assessment/Data: PPS 50%      Patient Active Problem List   Diagnosis Date Noted   Acute hypoxemic respiratory failure (HCC) 06/12/2023   Acute hypoxic respiratory failure (HCC) 06/12/2023   IDA (iron deficiency anemia) 06/08/2023   Acute cystitis 06/08/2023   Acute respiratory failure with hypoxia (HCC) 06/07/2023   Acute pulmonary embolism (HCC) 06/06/2023   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   Syncope 04/17/2021   AKI (acute kidney injury) (HCC) 04/17/2021   COVID-19 virus infection 04/17/2021   History of pericarditis 04/17/2021   Microcytic hypochromic anemia 04/17/2021   Hyponatremia 04/17/2021   Acute lower UTI 04/17/2021   Dehydration 04/17/2021   Protein calorie malnutrition (HCC) 04/01/2021   Cardiac tamponade    Protein-calorie malnutrition, moderate (HCC) 03/22/2021   Hyperlipidemia 03/22/2021   Essential hypertension 03/22/2021   Leukocytosis 03/22/2021   Falls 03/22/2021   Pericarditis 03/21/2021   Malignant neoplasm of upper-outer quadrant of left breast in female, estrogen receptor positive (HCC) 03/22/2019   Chest pain 08/11/2012   GERD (gastroesophageal reflux disease) 08/11/2012   Generalized anxiety disorder 08/11/2012    Palliative Care Assessment & Plan   Patient Profile: 83 y.o. female  with past medical history of recent diagnosis of pulmonary embolism on Xarelto, history of CVA, history of left breast cancer in remission, asthma, and essential hypertension was admitted on 06/11/2023 with acute hypoxic respiratory failure in setting of recurrent PE, moderate protein calorie malnutrition,  mediastinal/hilar lymphadenopathy.    Of note, patient has had 2 admissions and 4 ED visits in the last 6 months. She is a 7 day readmission.  Assessment: Principal Problem:   Acute hypoxemic  respiratory failure (HCC) Active Problems:   GERD (gastroesophageal reflux disease)   Generalized anxiety disorder   Malignant neoplasm of upper-outer quadrant of left breast in female, estrogen receptor positive (HCC)   Protein-calorie malnutrition, moderate (HCC)   Hyperlipidemia   Essential hypertension   COPD with chronic bronchitis and emphysema (HCC)   Acute pulmonary embolism (HCC)   Acute hypoxic respiratory failure (HCC)   Terminal care  Recommendations/Plan: Initiated full comfort measures Continue DNR/DNI as previously documented - durable DNR form completed and placed in shadow chart. Copy was made and will be scanned into Vynca/ACP tab Goal is for patient's discharge home with hospice, hopefully tomorrow 2/25, after DME delivery Added orders for EOL symptom management and to reflect full comfort measures, as well as discontinued orders that were not focused on comfort Unrestricted visitation orders were placed per current [facility] E[facility] visitation policy  Nursing to provide frequent assessments and administer PRN medications as clinically necessary to ensure EOL comfort PMT will continue to follow and support holistically  Symptom Management Morphine concentrate PRN pain/dyspnea; IV dose available if oral not effective Tylenol PRN pain/fever Biotin twice daily Benadryl PRN itching Robinul PRN secretions Haldol PRN agitation/delirium Ativan PRN anxiety/seizure/sleep/distress Zofran PRN nausea/vomiting Liquifilm Tears PRN dry eye Continue diamox, inhalers, tums, miralax, xarelto, senna while tolerating POs    Goals of Care and Additional Recommendations: Limitations on Scope of Treatment: Full Comfort Care  Code Status:    Code Status Orders  (From admission, onward)            Start     Ordered   06/14/23 1526  Do not attempt resuscitation (DNR)- Limited -Do Not Intubate (DNI)  (Code Status)  Continuous       Question Answer Comment  If pulseless and not breathing No CPR or chest compressions.   In Pre-Arrest Conditions (Patient Is Breathing and Has A Pulse) Do not intubate. Provide all appropriate non-invasive medical interventions. Avoid ICU transfer unless indicated or required.   Consent: Discussion documented in EHR or advanced directives reviewed      06/14/23 1525           Code Status History     Date Active Date Inactive Code Status Order ID Comments User Context   06/13/2023 1827 06/14/2023 1525 Full Code [id number]  Burnadette Pop, MD Inpatient   06/13/2023 1028 06/13/2023 1827 Limited: Do not attempt resuscitation (DNR) -DNR-LIMITED -Do Not Intubate/DNI  [id number]  Burnadette Pop, MD Inpatient   06/12/2023 0007 06/13/2023 1028 Full Code [id number]  Rometta Emery, MD ED   06/06/2023 1038 06/08/2023 2118 Full Code [id number]  Maryln Gottron, MD ED   04/17/2021 0755 04/20/2021 1851 Full Code [id number]  Clydie Braun, MD ED   03/22/2021 0304 04/06/2021 2123 Full Code [id number]  Zierle-Ghosh, Asia B, DO ED   08/11/2012 0107 08/11/2012 2025 Full Code [id number]  Lars Mage, MD Inpatient       Prognosis:  < 6 months  Discharge Planning: Home with Hospice  Care plan was discussed with primary RN, patient's daughter, patient, TOC, Dr. Renford Dills, Dr. Pamelia Hoit  Thank you for allowing the Palliative Medicine Team to assist in the care of this patient.   Total Time 65 minutes Prolonged Time Billed  yes       Haskel Khan, NP  Please contact Palliative Medicine Team phone at [phone] for questions and concerns.   *Portions of this note are a verbal dictation therefore any spelling and/or grammatical errors are due  to the [employer] One" system interpretation.

## 2023-06-15 NOTE — TOC Progression Note (Addendum)
 Transition of Care [facility]) - Progression Note    Patient Details  Name: Nicole Rush MRN: [id number] Date of Birth: 08/29/1940  Transition of [facility]) CM/SW Contact  Margarie Mcguirt, Olegario Messier, RN Phone Number: 06/15/2023, 11:29 AM  Clinical Narrative: [facility] Anger rep shawn following for home w/hospice in am. Likely PTAR @ d/c.  -1:49p-spoke to Donna(dtr) explained that medicaid is a process that can be managed in the otpt setting-calling DSS or with [facility] through their social worker.     Expected Discharge Plan: Home w Hospice Care Barriers to Discharge: Continued Medical Work up  Expected Discharge Plan and Services   Discharge Planning Services: CM Consult Post Acute Care Choice: Hospice Living arrangements for the past 2 months: Single Family Home                           HH Arranged: RN [facility] Agency: [facility] [place] Date [facility] Agency Contacted: 06/15/23 Time HH Agency Contacted: [id number] Representative spoke with at [facility] Agency: Shawn   Social Determinants of Health (SDOH) Interventions SDOH Screenings   Food Insecurity: No Food Insecurity (06/12/2023)  Housing: Low Risk  (06/12/2023)  Transportation Needs: No Transportation Needs (06/12/2023)  Utilities: Not At Risk (06/12/2023)  Social Connections: Unknown (06/12/2023)  Tobacco Use: Medium Risk (06/13/2023)    Readmission Risk Interventions     No data to display

## 2023-06-15 NOTE — Progress Notes (Signed)
 [facility] Liaison Note  Received request from [place], Transitions of Care Manager, for hospice services at home after discharge. Spoke with [name] to initiate education related to hospice philosophy, services, and team approach to care as well as inpatient hospice care and expectations. Patient/family verbalized understanding of information given. Per discussion, the plan is for discharge home by private vehicle on 2.25.25.   DME needs discussed. Patient has Oxygen from [employer], a wheelchair, and a bedside commode in the home. Patient/family requests the following equipment for delivery: more portable Oxygen and a wheeled walker. The address has been verified and is correct in the chart. [name] is the family contact to arrange time of equipment delivery.  Please send signed and completed DNR home with patient/family. Please provide prescriptions at discharge as needed to ensure ongoing symptom management.   [facility] information and contact numbers given to [name]. Above information shared with Olegario Messier, Transitions of Care Manager. Please call with any questions or concerns.   Thank you for the opportunity to participate in this patient's care.   Glenna Fellows BSN, Charity fundraiser, OCN [employer] [phone]

## 2023-06-16 ENCOUNTER — Other Ambulatory Visit (HOSPITAL_COMMUNITY): Payer: Self-pay

## 2023-06-16 DIAGNOSIS — J9601 Acute respiratory failure with hypoxia: Secondary | ICD-10-CM | POA: Diagnosis not present

## 2023-06-16 MED ORDER — MORPHINE SULFATE (CONCENTRATE) 10 MG /0.5 ML PO SOLN
5.0000 mg | ORAL | 0 refills | Status: DC | PRN
  Filled 2023-06-16: qty 90, 7d supply, fill #0

## 2023-06-16 MED ORDER — LORAZEPAM 1 MG PO TABS
1.0000 mg | ORAL_TABLET | ORAL | 0 refills | Status: DC | PRN
  Filled 2023-06-16: qty 10, 3d supply, fill #0

## 2023-06-16 MED ORDER — RIVAROXABAN 20 MG PO TABS: 20.0000 mg | ORAL_TABLET | Freq: Every day | ORAL | Status: DC

## 2023-06-16 MED ORDER — RIVAROXABAN (XARELTO) VTE STARTER PACK (15 & 20 MG)
15.0000 mg | ORAL_TABLET | Freq: Two times a day (BID) | ORAL | Status: AC
Start: 2023-06-16 — End: 2023-06-30

## 2023-06-16 NOTE — Progress Notes (Signed)
 TOC meds in a secure bag delivered to pt in room- placed into pt's black backpack on bed. Primary nurse updated on delivery.

## 2023-06-16 NOTE — TOC Transition Note (Signed)
 Transition of Care [facility]) - Discharge Note   Patient Details  Name: Nicole Rush MRN: [id number] Date of Birth: [birth year]  Transition [facility]) CM/SW Contact:  Lanier Clam, RN Phone Number: 06/16/2023, 10:57 AM   Clinical Narrative:  Sherron Monday to Donna(dtr) about d/c plans-d/c today home w/[facility] for home hospice services;dme rollator delivered. Already active w/[employer] rep Mitch for home 02-[employer] rep Marthann Schiller will deliver home 02 travel tank to rm prior d/c. Donna(dtr) will transport home on own after work. [facility] rep shawn already following for home hospice services. No further CM needs.    Final next level of care: Home w Hospice Care Barriers to Discharge: No Barriers Identified   Patient Goals and CMS Choice Patient states their goals for this hospitalization and ongoing recovery are:: Home CMS Medicare.gov Compare Post Acute Care list provided to:: Patient Represenative (must comment) (Donna(dtr)) Choice offered to / list presented to : Adult Children [employer] ownership interest in [facility]provided to:: Adult Children    Discharge Placement                       Discharge Plan and Services Additional resources added to the After Visit Summary for     Discharge Planning Services: CM Consult Post Acute Care Choice: Durable Medical Equipment, Hospice          DME Arranged: Oxygen DME Agency: [employer] Date DME Agency Contacted: 06/16/23 Time DME Agency Contacted: 1057 Representative spoke with at DME Agency: Marthann Schiller HH Arranged: RN HH Agency: [facility] Date [facility] Agency Contacted: 06/16/23 Time HH Agency Contacted: 1057 Representative spoke with at [facility] Agency: Shawn  Social Drivers of Health (SDOH) Interventions SDOH Screenings   Food Insecurity: No Food Insecurity (06/12/2023)  Housing: Low Risk  (06/12/2023)  Transportation Needs: No Transportation Needs (06/12/2023)  Utilities: Not At Risk  (06/12/2023)  Social Connections: Unknown (06/12/2023)  Tobacco Use: Medium Risk (06/13/2023)     Readmission Risk Interventions     No data to display

## 2023-06-16 NOTE — Discharge Summary (Signed)
 Physician Discharge Summary  Nicole Rush WJX:[id number] DOB: [birth year] DOA: 06/11/2023  PCP: Leilani Able, MD  Admit date: 06/11/2023 Discharge date: 06/16/2023  Admitted From: Home Disposition:  Home with Hospice  Discharge Condition:Stable CODE STATUS:Comfort Care Diet recommendation:  Regular  Brief/Interim Summary: Patient is 83 female with history of PE on Xarelto, CVA, breast cancer in remission, hypertension who presented from home with complaint of shortness of breath.  She was just discharged from here on 2/17 after she was admitted for PE .patient compliant with Xarelto.  CT angiogram done here on admission showed small new PE and slight worsening of previous PE, lymphadenopathy.  Required 6 L of oxygen per minute on arrival, now on 2 L of oxygen which is her baseline.  Admitted for the management of acute hypoxic respiratory secondary to current PE.  Oncology, palliative care consulted.  After discussion with family, decision made to transition her care to comfort.  Plan for discharge home today with hospice   Following problems were addressed during the hospitalization:  Mediastinal/hilar lymphadenopathy/history of bladder cancer/Goals of care: Similar to her last admission, CT scan showed stable mediastinal and bilateral hilar adenopathy.  Possible metastatic adenopathy or lymphoma .  Oncology was following here.  She also has history of bladder cancer.  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   She follows with Dr. Liliane Shi, urology. 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  Plan for discharge home with hospice   Recurrent acute PE: Patient was admitted on 2/15 and was found to have PE at the time.  Presented with dyspnea again.  Repeat CT shows  stable or slightly improved nonocclusive pulmonary emboli in the lower lobes bilaterally.New small nonocclusive embolus in the right middle lobe.  Currently on 2 L of oxygen. Echo done few days ago showed EF of 75%, grade 1 diastolic dysfunction, no right heart strain.  PE was thought to be secondary to immobility.  Decision to resume Xarelto as per hospice services at home.   Chronic hypoxic respiratory failure: On last admission, she required oxygen on discharge and has been on 3 L of oxygen per minute at home.  Currently on 2L    History of breast cancer: Status post lumpectomy in 2020, no longer on anastrozole.    Other past medical problems:   Iron deficiency anemia,COPD, anxiety disorder, hyperlipidemia, hypertension Discharge Diagnoses:  Principal Problem:   Acute hypoxemic respiratory failure (HCC) Active Problems:   Acute pulmonary embolism (HCC)   Malignant neoplasm of upper-outer quadrant of left breast in female, estrogen receptor positive (HCC)   GERD (gastroesophageal reflux disease)   Generalized anxiety disorder   Protein-calorie malnutrition, moderate (HCC)   Hyperlipidemia   Essential hypertension   COPD with chronic bronchitis and emphysema (HCC)   Acute hypoxic respiratory failure (HCC)    Discharge Instructions  Discharge Instructions     Diet general   Complete by: As directed    Discharge instructions   Complete by: As directed    Please follow up with hospice services   Increase activity slowly   Complete by: As directed       Allergies as of 06/16/2023   No Known Allergies      Medication List     STOP taking these medications    acetaZOLAMIDE 250 MG tablet Commonly known  as: DIAMOX   brimonidine 0.2 % ophthalmic solution Commonly known as: ALPHAGAN   cefadroxil 500 MG capsule Commonly known as: DURICEF   dorzolamide-timolol 2-0.5 % ophthalmic solution Commonly known as: COSOPT   gabapentin 400 MG capsule Commonly known as:  NEURONTIN   latanoprost 0.005 % ophthalmic solution Commonly known as: XALATAN       TAKE these medications    acetaminophen 500 MG tablet Commonly known as: TYLENOL Take 1,000 mg by mouth every 6 (six) hours as needed for mild pain (pain score 1-3) or moderate pain (pain score 4-6).   albuterol 108 (90 Base) MCG/ACT inhaler Commonly known as: VENTOLIN HFA Inhale 1-2 puffs into the lungs every 4 (four) hours as needed for wheezing or shortness of breath.   ibuprofen 600 MG tablet Commonly known as: ADVIL Take 600 mg by mouth every 6 (six) hours as needed for mild pain (pain score 1-3) or moderate pain (pain score 4-6).   LORazepam 1 MG tablet Commonly known as: ATIVAN Take 1 tablet (1 mg total) by mouth every 4 (four) hours as needed for anxiety, seizure or sleep (distress).   morphine CONCENTRATE 10 mg / 0.5 ml concentrated solution Take 0.25-0.5 mLs (5-10 mg total) by mouth every 2 (two) hours as needed for severe pain (pain score 7-10), moderate pain (pain score 4-6), anxiety or shortness of breath.   OXYGEN Inhale into the lungs as needed (wheezing/SOB).   prednisoLONE acetate 1 % ophthalmic suspension Commonly known as: PRED FORTE Place 1 drop into the right eye 3 (three) times daily.   Rivaroxaban Starter Pack (15 mg and 20 mg) Commonly known as: XARELTO STARTER PACK Take 15 mg by mouth 2 (two) times daily for 14 days. What changed:  how much to take how to take this when to take this additional instructions   rivaroxaban 20 MG Tabs tablet Commonly known as: XARELTO Take 1 tablet (20 mg total) by mouth daily with supper. Start taking on: June 30, 2023 What changed:  additional instructions These instructions start on June 30, 2023. If you are unsure what to do until then, ask your doctor or other care provider.        No Known Allergies  Consultations: Palliative care, oncology   Procedures/Studies: CT Angio Chest Pulmonary Embolism (PE) W or WO  Contrast Result Date: 06/11/2023 CLINICAL DATA:  Pulmonary embolism (PE) suspected, high prob. Shortness of breath EXAM: CT ANGIOGRAPHY CHEST WITH CONTRAST TECHNIQUE: Multidetector CT imaging of the chest was performed using the standard protocol during bolus administration of intravenous contrast. Multiplanar CT image reconstructions and MIPs were obtained to evaluate the vascular anatomy. RADIATION DOSE REDUCTION: This exam was performed according to the departmental dose-optimization program which includes automated exposure control, adjustment of the mA and/or kV according to patient size and/or use of iterative reconstruction technique. CONTRAST:  75mL OMNIPAQUE IOHEXOL 350 MG/ML SOLN COMPARISON:  06/06/2023 FINDINGS: Cardiovascular: Bilateral lower lobe pulmonary emboli are again noted. Overall clot burden similar or slightly decreased since prior study. Small nonocclusive pulmonary embolus within the right middle lobe, not definitively seen on prior study. Heart is mildly enlarged. Aorta normal caliber. No dissection. Scattered calcifications. Mediastinum/Nodes: Mediastinal and bilateral hilar adenopathy again noted, similar to prior study. Trachea and esophagus are unremarkable. Thyroid unremarkable. Lungs/Pleura: Trace right pleural effusion. Bibasilar dependent atelectasis. Upper Abdomen: No acute findings Musculoskeletal: Chest wall soft tissues are unremarkable. No acute bony abnormality. Review of the MIP images confirms the above findings. IMPRESSION: Stable or slightly improved nonocclusive  pulmonary emboli in the lower lobes bilaterally. New small nonocclusive embolus in the right middle lobe. New trace right pleural effusion.  Dependent bibasilar atelectasis. Stable mediastinal and bilateral hilar adenopathy. This appears to be greater than reactive lymph nodes and metastatic adenopathy or lymphoma cannot be excluded. Aortic Atherosclerosis (ICD10-I70.0). Electronically Signed   By: Charlett Nose  M.D.   On: 06/11/2023 22:09   DG Chest Port 1 View Result Date: 06/11/2023 CLINICAL DATA:  Shortness of breath.  Productive cough. EXAM: PORTABLE CHEST 1 VIEW COMPARISON:  Radiograph and CT 5 days ago 06/06/2023 FINDINGS: Stable heart size and mediastinal contours. Right hilar prominence corresponding to adenopathy on prior exam. Stable atelectasis/scarring in the periphery of the right lung base. There may be a trace right pleural effusion. No acute airspace disease. No pulmonary edema. No pneumothorax. Left axillary surgical clips. IMPRESSION: Stable atelectasis/scarring in the periphery of the right lung base. Possible trace right pleural effusion. Electronically Signed   By: Narda Rutherford M.D.   On: 06/11/2023 15:05   VAS [place] LOWER EXTREMITY VENOUS (DVT) Result Date: 06/06/2023  Lower Venous DVT Study Patient Name:  Nicole Rush [facility]  Date of Exam:   06/06/2023 Medical Rec #: [id number]           Accession #:    [id number] Date of Birth: 01/06/1941            Patient Gender: F Patient Age:   69 years Exam Location:  [facility] Procedure:      VAS [place] LOWER EXTREMITY VENOUS (DVT) Referring Phys: MIR [facility] --------------------------------------------------------------------------------  Indications: Pulmonary embolism.  Risk Factors: Hx of breast cancer. Comparison Study: Previous exam (RLEV) on 03/13/2013 was negative for DVT. Performing Technologist: Ernestene Mention RVT, RDMS  Examination Guidelines: A complete evaluation includes B-mode imaging, spectral Doppler, color Doppler, and power Doppler as needed of all accessible portions of each vessel. Bilateral testing is considered an integral part of a complete examination. Limited examinations for reoccurring indications may be performed as noted. The reflux portion of the exam is performed with the patient in reverse Trendelenburg.  +---------+---------------+---------+-----------+----------+--------------+ RIGHT     CompressibilityPhasicitySpontaneityPropertiesThrombus Aging +---------+---------------+---------+-----------+----------+--------------+ CFV      Full           Yes      Yes                                 +---------+---------------+---------+-----------+----------+--------------+ SFJ      Full                                                        +---------+---------------+---------+-----------+----------+--------------+ FV Prox  Full           Yes      Yes                                 +---------+---------------+---------+-----------+----------+--------------+ FV Mid   Full           Yes      Yes                                 +---------+---------------+---------+-----------+----------+--------------+ FV  DistalFull           Yes      Yes                                 +---------+---------------+---------+-----------+----------+--------------+ PFV      Full                                                        +---------+---------------+---------+-----------+----------+--------------+ POP      Full           Yes      Yes                                 +---------+---------------+---------+-----------+----------+--------------+ PTV      Full                                                        +---------+---------------+---------+-----------+----------+--------------+ PERO     Full                                                        +---------+---------------+---------+-----------+----------+--------------+   +---------+---------------+---------+-----------+----------+--------------+ LEFT     CompressibilityPhasicitySpontaneityPropertiesThrombus Aging +---------+---------------+---------+-----------+----------+--------------+ CFV      Full           Yes      Yes                                 +---------+---------------+---------+-----------+----------+--------------+ SFJ      Full                                                         +---------+---------------+---------+-----------+----------+--------------+ FV Prox  Full           Yes      Yes                                 +---------+---------------+---------+-----------+----------+--------------+ FV Mid   Full           Yes      Yes                                 +---------+---------------+---------+-----------+----------+--------------+ FV DistalFull           Yes      Yes                                 +---------+---------------+---------+-----------+----------+--------------+ PFV      Full                                                        +---------+---------------+---------+-----------+----------+--------------+  POP      Full           Yes      Yes                                 +---------+---------------+---------+-----------+----------+--------------+ PTV      Full                                                        +---------+---------------+---------+-----------+----------+--------------+ PERO     Full                                                        +---------+---------------+---------+-----------+----------+--------------+     Summary: BILATERAL: - No evidence of deep vein thrombosis seen in the lower extremities, bilaterally. -No evidence of popliteal cyst, bilaterally.   *See table(s) above for measurements and observations. Electronically signed by Carolynn Sayers on 06/06/2023 at 8:18:30 PM.    Final    ECHOCARDIOGRAM COMPLETE Result Date: 06/06/2023    ECHOCARDIOGRAM REPORT   Patient Name:   Nicole Rush Date of Exam: 06/06/2023 Medical Rec #:  [id number]          Height:       60.0 in Accession #:    [id number]         Weight:       95.0 lb Date of Birth:  1940/10/04           BSA:          1.360 m Patient Age:    82 years           BP:           147/107 mmHg Patient Gender: F                  HR:           86 bpm. Exam Location:  Inpatient Procedure: 2D Echo, 3D Echo, Cardiac Doppler,  Color Doppler and Strain Analysis            (Both Spectral and Color Flow Doppler were utilized during            procedure). Indications:    Pulmonary embolus  History:        Patient has prior history of Echocardiogram examinations, most                 recent 04/17/2021. COPD, Signs/Symptoms:Syncope and Chest Pain;                 Risk Factors:Hypertension and Dyslipidemia. Pericarditis,                 Cardiac tamponade.  Sonographer:    Vern Claude Referring Phys: [id number] [facility]IR [facility] IMPRESSIONS  1. Left ventricular ejection fraction, by estimation, is >75%. The left ventricle has hyperdynamic function. The left ventricle has no regional wall motion abnormalities. Left ventricular diastolic parameters are consistent with Grade I diastolic dysfunction (impaired relaxation). The average left ventricular global longitudinal strain is -18.1 %. The global longitudinal strain is normal.  2. Right ventricular systolic function  is normal. The right ventricular size is normal. There is moderately elevated pulmonary artery systolic pressure.  3. Left atrial size was mildly dilated.  4. The mitral valve is normal in structure. No evidence of mitral valve regurgitation. No evidence of mitral stenosis.  5. The tricuspid valve is abnormal.  6. The aortic valve is tricuspid. There is mild calcification of the aortic valve. There is mild thickening of the aortic valve. Aortic valve regurgitation is not visualized. No aortic stenosis is present.  7. The inferior vena cava is normal in size with greater than 50% respiratory variability, suggesting right atrial pressure of 3 mmHg. FINDINGS  Left Ventricle: Left ventricular ejection fraction, by estimation, is >75%. The left ventricle has hyperdynamic function. The left ventricle has no regional wall motion abnormalities. The average left ventricular global longitudinal strain is -18.1 %. Strain was performed and the global longitudinal strain is normal. The left  ventricular internal cavity size was normal in size. There is no left ventricular hypertrophy. Left ventricular diastolic parameters are consistent with Grade I diastolic dysfunction (impaired relaxation). Normal left ventricular filling pressure. Right Ventricle: The right ventricular size is normal. Right vetricular wall thickness was not well visualized. Right ventricular systolic function is normal. There is moderately elevated pulmonary artery systolic pressure. The tricuspid regurgitant velocity is 3.45 m/s, and with an assumed right atrial pressure of 3 mmHg, the estimated right ventricular systolic pressure is 50.6 mmHg. Left Atrium: Left atrial size was mildly dilated. Right Atrium: Right atrial size was normal in size. Pericardium: There is no evidence of pericardial effusion. Mitral Valve: The mitral valve is normal in structure. No evidence of mitral valve regurgitation. No evidence of mitral valve stenosis. MV peak gradient, 3.1 mmHg. The mean mitral valve gradient is 2.0 mmHg. Tricuspid Valve: The tricuspid valve is abnormal. Tricuspid valve regurgitation is mild . No evidence of tricuspid stenosis. Aortic Valve: The aortic valve is tricuspid. There is mild calcification of the aortic valve. There is mild thickening of the aortic valve. There is mild aortic valve annular calcification. Aortic valve regurgitation is not visualized. No aortic stenosis  is present. Aortic valve mean gradient measures 4.0 mmHg. Aortic valve peak gradient measures 8.8 mmHg. Aortic valve area, by VTI measures 2.75 cm. Pulmonic Valve: The pulmonic valve was not well visualized. Pulmonic valve regurgitation is not visualized. No evidence of pulmonic stenosis. Aorta: The aortic root and ascending aorta are structurally normal, with no evidence of dilitation. Venous: The inferior vena cava is normal in size with greater than 50% respiratory variability, suggesting right atrial pressure of 3 mmHg. IAS/Shunts: No atrial level  shunt detected by color flow Doppler. Additional Comments: 3D was performed not requiring image post processing on an independent workstation and was normal.  LEFT VENTRICLE PLAX 2D LVIDd:         3.40 cm      Diastology LVIDs:         2.20 cm      LV e' medial:    6.64 cm/s LV PW:         0.80 cm      LV E/e' medial:  10.2 LV IVS:        1.00 cm      LV e' lateral:   7.29 cm/s LVOT diam:     1.90 cm      LV E/e' lateral: 9.3 LV SV:         62 LV SV Index:   45  2D Longitudinal Strain LVOT Area:     2.84 cm     2D Strain GLS Avg:     -18.1 %  LV Volumes (MOD) LV vol d, MOD A2C: 80.7 ml LV vol d, MOD A4C: 100.0 ml LV vol s, MOD A2C: 26.5 ml LV vol s, MOD A4C: 30.2 ml LV SV MOD A2C:     54.2 ml LV SV MOD A4C:     100.0 ml LV SV MOD BP:      61.0 ml RIGHT VENTRICLE             IVC RV Basal diam:  2.80 cm     IVC diam: 1.90 cm RV Mid diam:    1.70 cm RV S prime:     12.20 cm/s TAPSE (M-mode): 2.5 cm LEFT ATRIUM             Index        RIGHT ATRIUM           Index LA diam:        2.70 cm 1.98 cm/m   RA Area:     10.70 cm LA Vol (A2C):   44.0 ml 32.34 ml/m  RA Volume:   18.60 ml  13.67 ml/m LA Vol (A4C):   53.5 ml 39.33 ml/m LA Biplane Vol: 48.6 ml 35.73 ml/m  AORTIC VALVE                    PULMONIC VALVE AV Area (Vmax):    2.61 cm     PV Vmax:       1.11 m/s AV Area (Vmean):   2.37 cm     PV Peak grad:  4.9 mmHg AV Area (VTI):     2.75 cm AV Vmax:           148.00 cm/s AV Vmean:          92.700 cm/s AV VTI:            0.224 m AV Peak Grad:      8.8 mmHg AV Mean Grad:      4.0 mmHg LVOT Vmax:         136.00 cm/s LVOT Vmean:        77.400 cm/s LVOT VTI:          0.217 m LVOT/AV VTI ratio: 0.97  AORTA Ao Root diam: 2.90 cm Ao Asc diam:  3.00 cm MITRAL VALVE               TRICUSPID VALVE MV Area (PHT): 3.50 cm    TR Peak grad:   47.6 mmHg MV Area VTI:   2.87 cm    TR Vmax:        345.00 cm/s MV Peak grad:  3.1 mmHg MV Mean grad:  2.0 mmHg    SHUNTS MV Vmax:       0.88 m/s    Systemic VTI:  0.22 m MV  Vmean:      62.5 cm/s   Systemic Diam: 1.90 cm MV Decel Time: 217 msec MV E velocity: 67.80 cm/s MV A velocity: 92.40 cm/s MV E/A ratio:  0.73 Dina Rich MD Electronically signed by Dina Rich MD Signature Date/Time: 06/06/2023/4:20:07 PM    Final    CT Angio Chest PE W and/or Wo Contrast Addendum Date: 06/06/2023 ADDENDUM REPORT: 06/06/2023 09:58 ADDENDUM: Critical Value/emergent results were called by telephone at the time of interpretation on 06/06/2023 at 9:52 am to provider [facility] ,  who verbally acknowledged these results. Electronically Signed   By: Jules Schick M.D.   On: 06/06/2023 09:58   Result Date: 06/06/2023 CLINICAL DATA:  Pulmonary embolism (PE) suspected, high prob. Shortness of breath. Cough and headache. EXAM: CT ANGIOGRAPHY CHEST WITH CONTRAST TECHNIQUE: Multidetector CT imaging of the chest was performed using the standard protocol during bolus administration of intravenous contrast. Multiplanar CT image reconstructions and MIPs were obtained to evaluate the vascular anatomy. RADIATION DOSE REDUCTION: This exam was performed according to the departmental dose-optimization program which includes automated exposure control, adjustment of the mA and/or kV according to patient size and/or use of iterative reconstruction technique. CONTRAST:  75mL OMNIPAQUE IOHEXOL 350 MG/ML SOLN COMPARISON:  CT scan chest from 06/17/2021. FINDINGS: Cardiovascular: Small volume acute pulmonary embolism noted. There is nonocclusive pulmonary embolism in the left lung lower lobe posterior segmental pulmonary artery branch with extension into the subsegmental pulmonary artery branches. There is also small volume occlusive pulmonary embolism in the right lung upper lobe segmental pulmonary artery branch and very small volume nonocclusive embolism in the distal subsegmental pulmonary branch to the right lung lower lobe posterior segment. No right heart strain or lung infarction. There is dilation of the  main pulmonary trunk measuring up to 3.2 cm, which is nonspecific but can be seen with pulmonary artery hypertension. Normal cardiac size. No pericardial effusion. No aortic aneurysm. There are coronary artery calcifications, in keeping with coronary artery disease. There are also mild peripheral atherosclerotic vascular calcifications of thoracic aorta and its major branches. Mediastinum/Nodes: Visualized thyroid gland appears grossly unremarkable. No solid / cystic mediastinal masses. The esophagus is nondistended precluding optimal assessment. There is soft tissue fullness in the mediastinum and bilateral hila, favored to represent enlarged lymph nodes which appears more pronounced than the prior exam however, favored benign/reactive. Correlate clinically to determine the need for follow-up imaging in 3-6 months to document resolution. No axillary lymphadenopathy by size criteria. Lungs/Pleura: The central tracheo-bronchial tree is patent. Upper lobe predominant moderate emphysematous changes noted. There are patchy areas of linear, plate-like atelectasis and/or scarring throughout bilateral lungs, with asymmetric more involvement of right lung lower lobe. No mass or consolidation. No pleural effusion or pneumothorax. No suspicious lung nodules. Upper Abdomen: Visualized upper abdominal viscera within normal limits. Musculoskeletal: The visualized soft tissues of the chest wall are grossly unremarkable. No suspicious osseous lesions. There are mild multilevel degenerative changes in the visualized spine. Review of the MIP images confirms the above findings. IMPRESSION: 1. Small volume acute occlusive and nonocclusive pulmonary embolism, as described in detail above. No right heart strain or lung infarction. 2. Mild interval increase in the size of pre-existing mediastinal and hilar lymphadenopathy, favored benign/reactive. However, correlate clinically to determine the need for follow-up imaging in 3-6 months to  document resolution. 3. There are patchy areas of scarring/atelectasis throughout bilateral lungs with asymmetric more involvement of right lung lower lobe. However, no lung mass, consolidation, pleural effusion or pneumothorax. 4. Multiple other nonacute observations, as described above. Aortic Atherosclerosis (ICD10-I70.0) and Emphysema (ICD10-J43.9). Electronically Signed: By: Jules Schick M.D. On: 06/06/2023 09:46   DG Chest Port 1 View Result Date: 06/06/2023 CLINICAL DATA:  Shortness of breath EXAM: PORTABLE CHEST 1 VIEW COMPARISON:  05/14/2023 FINDINGS: Normal heart size and mediastinal contours.  Aortic tortuosity. Streaky density at the right lung base compatible with scarring seen on chest CT 06/17/2021. There is no edema, consolidation, effusion, or pneumothorax. Postoperative left breast and axilla. IMPRESSION: Cardiomegaly without edema or focal pneumonia.  Electronically Signed   By: Tiburcio Pea M.D.   On: 06/06/2023 07:31      Subjective: Seen and examined the bedside today.  Appeared comfortable, sitting in the chair.  Not in any kind of distress.  Discussion held with daughter and sister at bedside about discharge planning to home with hospice  Discharge Exam: Vitals:   06/16/23 0323 06/16/23 0854  BP: 133/81   Pulse: 88   Resp: 20   Temp: 97.7 F (36.5 C)   SpO2: 100% 100%   Vitals:   06/15/23 0550 06/15/23 2026 06/16/23 0323 06/16/23 0854  BP: 108/72 115/66 133/81   Pulse: 80 99 88   Resp: 20 20 20    Temp: 98.6 F (37 C) 98.2 F (36.8 C) 97.7 F (36.5 C)   TempSrc: Oral Oral Oral   SpO2: 100% 100% 100% 100%  Weight:      Height:        General: Pt is alert, awake, not in acute distress, chronically deconditioned, weak Cardiovascular: RRR, S1/S2 +, no rubs, no gallops Respiratory: CTA bilaterally, no wheezing, no rhonchi Abdominal: Soft, NT, ND, bowel sounds + Extremities: no edema, no cyanosis    The results of significant diagnostics from this  hospitalization (including imaging, microbiology, ancillary and laboratory) are listed below for reference.     Microbiology: Recent Results (from the past 240 hours)  Resp panel by RT-PCR (RSV, Flu A&B, Covid) Anterior Nasal Swab     Status: None   Collection Time: 06/11/23  1:40 PM   Specimen: Anterior Nasal Swab  Result Value Ref Range Status   SARS Coronavirus 2 by RT PCR NEGATIVE NEGATIVE Final    Comment: (NOTE) SARS-CoV-2 target nucleic acids are NOT DETECTED.  The SARS-CoV-2 RNA is generally detectable in upper respiratory specimens during the acute phase of infection. The lowest concentration of SARS-CoV-2 viral copies this assay can detect is 138 copies/mL. A negative result does not preclude SARS-Cov-2 infection and should not be used as the sole basis for treatment or other patient management decisions. A negative result may occur with  improper specimen collection/handling, submission of specimen other than nasopharyngeal swab, presence of viral mutation(s) within the areas targeted by this assay, and inadequate number of viral copies(<138 copies/mL). A negative result must be combined with clinical observations, patient history, and epidemiological information. The expected result is Negative.  Fact Sheet for Patients:  BloggerCourse.com  Fact Sheet for Healthcare Providers:  SeriousBroker.it  This test is no t yet approved or cleared by the [place] FDA and  has been authorized for detection and/or diagnosis of SARS-CoV-2 by FDA under an Emergency Use Authorization (EUA). This EUA will remain  in effect (meaning this test can be used) for the duration of the COVID-19 declaration under Section 564(b)(1) of the Act, 21 U.S.C.section 360bbb-3(b)(1), unless the authorization is terminated  or revoked sooner.       Influenza A by PCR NEGATIVE NEGATIVE Final   Influenza B by PCR NEGATIVE NEGATIVE Final     Comment: (NOTE) The Xpert Xpress SARS-CoV-2/FLU/RSV plus assay is intended as an aid in the diagnosis of influenza from Nasopharyngeal swab specimens and should not be used as a sole basis for treatment. Nasal washings and aspirates are unacceptable for Xpert Xpress SARS-CoV-2/FLU/RSV testing.  Fact Sheet for Patients: BloggerCourse.com  Fact Sheet for Healthcare Providers: SeriousBroker.it  This test is not yet approved or cleared by the [place] FDA and has been authorized for detection and/or diagnosis of SARS-CoV-2  by FDA under an Emergency Use Authorization (EUA). This EUA will remain in effect (meaning this test can be used) for the duration of the COVID-19 declaration under Section 564(b)(1) of the Act, 21 U.S.C. section 360bbb-3(b)(1), unless the authorization is terminated or revoked.     Resp Syncytial Virus by PCR NEGATIVE NEGATIVE Final    Comment: (NOTE) Fact Sheet for Patients: BloggerCourse.com  Fact Sheet for Healthcare Providers: SeriousBroker.it  This test is not yet approved or cleared by the [place] FDA and has been authorized for detection and/or diagnosis of SARS-CoV-2 by FDA under an Emergency Use Authorization (EUA). This EUA will remain in effect (meaning this test can be used) for the duration of the COVID-19 declaration under Section 564(b)(1) of the Act, 21 U.S.C. section 360bbb-3(b)(1), unless the authorization is terminated or revoked.  Performed at [facility], [address], [place], [place] [address]      Labs: BNP (last 3 results) Recent Labs    06/06/23 0658 06/11/23 1340  BNP 111.4* 90.9   Basic Metabolic Panel: Recent Labs  Lab 06/11/23 2111 06/12/23 0618 06/13/23 0816 06/15/23 0458  NA 139 137 136 135  K 3.6 3.5 3.8 4.0  CL 104 105 104 110  CO2  --  21* 21* 16*  GLUCOSE 92 69* 86 91  BUN  15 13 14  24*  CREATININE 0.70 0.57 0.72 0.97  CALCIUM  --  7.5* 8.9 8.8*   Liver Function Tests: Recent Labs  Lab 06/12/23 0618  AST 15  ALT 12  ALKPHOS 58  BILITOT 0.7  PROT 6.0*  ALBUMIN 2.5*   No results for input(s): "LIPASE", "AMYLASE" in the last 168 hours. No results for input(s): "AMMONIA" in the last 168 hours. CBC: Recent Labs  Lab 06/11/23 1327 06/11/23 2111 06/12/23 0618 06/13/23 0816 06/15/23 0722  WBC 9.3  --  9.9 8.7 9.4  HGB 7.9* 7.8* 9.6* 10.4* 11.0*  HCT 25.1* 23.0* 30.7* 32.3* 36.2  MCV 78.2*  --  80.6 79.8* 84.6  PLT 343  --  325 334 355   Cardiac Enzymes: No results for input(s): "CKTOTAL", "CKMB", "CKMBINDEX", "TROPONINI" in the last 168 hours. BNP: Invalid input(s): "POCBNP" CBG: Recent Labs  Lab 06/14/23 0723 06/14/23 1113 06/14/23 1559 06/14/23 2341 06/15/23 0554  GLUCAP 82 84 115* 99 120*   D-Dimer No results for input(s): "DDIMER" in the last 72 hours. Hgb A1c No results for input(s): "HGBA1C" in the last 72 hours. Lipid Profile No results for input(s): "CHOL", "HDL", "LDLCALC", "TRIG", "CHOLHDL", "LDLDIRECT" in the last 72 hours. Thyroid function studies No results for input(s): "TSH", "T4TOTAL", "T3FREE", "THYROIDAB" in the last 72 hours.  Invalid input(s): "FREET3" Anemia work up No results for input(s): "VITAMINB12", "FOLATE", "FERRITIN", "TIBC", "IRON", "RETICCTPCT" in the last 72 hours. Urinalysis    Component Value Date/Time   COLORURINE YELLOW 06/11/2023 2034   APPEARANCEUR HAZY (A) 06/11/2023 2034   LABSPEC 1.020 06/11/2023 2034   PHURINE 7.0 06/11/2023 2034   GLUCOSEU NEGATIVE 06/11/2023 2034   HGBUR MODERATE (A) 06/11/2023 2034   BILIRUBINUR NEGATIVE 06/11/2023 2034   KETONESUR NEGATIVE 06/11/2023 2034   PROTEINUR 100 (A) 06/11/2023 2034   NITRITE NEGATIVE 06/11/2023 2034   LEUKOCYTESUR SMALL (A) 06/11/2023 2034   Sepsis Labs Recent Labs  Lab 06/11/23 1327 06/12/23 0618 06/13/23 0816 06/15/23 0722   WBC 9.3 9.9 8.7 9.4   Microbiology Recent Results (from the past 240 hours)  Resp panel by RT-PCR (RSV, Flu A&B, Covid) Anterior Nasal  Swab     Status: None   Collection Time: 06/11/23  1:40 PM   Specimen: Anterior Nasal Swab  Result Value Ref Range Status   SARS Coronavirus 2 by RT PCR NEGATIVE NEGATIVE Final    Comment: (NOTE) SARS-CoV-2 target nucleic acids are NOT DETECTED.  The SARS-CoV-2 RNA is generally detectable in upper respiratory specimens during the acute phase of infection. The lowest concentration of SARS-CoV-2 viral copies this assay can detect is 138 copies/mL. A negative result does not preclude SARS-Cov-2 infection and should not be used as the sole basis for treatment or other patient management decisions. A negative result may occur with  improper specimen collection/handling, submission of specimen other than nasopharyngeal swab, presence of viral mutation(s) within the areas targeted by this assay, and inadequate number of viral copies(<138 copies/mL). A negative result must be combined with clinical observations, patient history, and epidemiological information. The expected result is Negative.  Fact Sheet for Patients:  BloggerCourse.com  Fact Sheet for Healthcare Providers:  SeriousBroker.it  This test is no t yet approved or cleared by the [place] FDA and  has been authorized for detection and/or diagnosis of SARS-CoV-2 by FDA under an Emergency Use Authorization (EUA). This EUA will remain  in effect (meaning this test can be used) for the duration of the COVID-19 declaration under Section 564(b)(1) of the Act, 21 U.S.C.section 360bbb-3(b)(1), unless the authorization is terminated  or revoked sooner.       Influenza A by PCR NEGATIVE NEGATIVE Final   Influenza B by PCR NEGATIVE NEGATIVE Final    Comment: (NOTE) The Xpert Xpress SARS-CoV-2/FLU/RSV plus assay is intended as an aid in the  diagnosis of influenza from Nasopharyngeal swab specimens and should not be used as a sole basis for treatment. Nasal washings and aspirates are unacceptable for Xpert Xpress SARS-CoV-2/FLU/RSV testing.  Fact Sheet for Patients: BloggerCourse.com  Fact Sheet for Healthcare Providers: SeriousBroker.it  This test is not yet approved or cleared by the [place] FDA and has been authorized for detection and/or diagnosis of SARS-CoV-2 by FDA under an Emergency Use Authorization (EUA). This EUA will remain in effect (meaning this test can be used) for the duration of the COVID-19 declaration under Section 564(b)(1) of the Act, 21 U.S.C. section 360bbb-3(b)(1), unless the authorization is terminated or revoked.     Resp Syncytial Virus by PCR NEGATIVE NEGATIVE Final    Comment: (NOTE) Fact Sheet for Patients: BloggerCourse.com  Fact Sheet for Healthcare Providers: SeriousBroker.it  This test is not yet approved or cleared by the [place] FDA and has been authorized for detection and/or diagnosis of SARS-CoV-2 by FDA under an Emergency Use Authorization (EUA). This EUA will remain in effect (meaning this test can be used) for the duration of the COVID-19 declaration under Section 564(b)(1) of the Act, 21 U.S.C. section 360bbb-3(b)(1), unless the authorization is terminated or revoked.  Performed at [facility], [address], [place], [place] [address]     Please note: You were cared for by a hospitalist during your hospital stay. Once you are discharged, your primary care physician will handle any further medical issues. Please note that NO REFILLS for any discharge medications will be authorized once you are discharged, as it is imperative that you return to your primary care physician (or establish a relationship with a primary care physician if you do  not have one) for your post hospital discharge needs so that they can reassess your need for medications and monitor  your lab values.    Time coordinating discharge: 40 minutes  SIGNED:   Burnadette Pop, MD  [facility] 06/16/2023, 10:47 AM Pager [phone]  If 7PM-7AM, please contact night-coverage [email or web address] Password [email or web address]

## 2023-06-16 NOTE — Plan of Care (Signed)

## 2023-06-22 ENCOUNTER — Other Ambulatory Visit: Payer: Medicare HMO

## 2023-06-26 ENCOUNTER — Encounter: Payer: Self-pay | Admitting: Pulmonary Disease

## 2023-08-20 DEATH — deceased

## 2023-10-13 ENCOUNTER — Other Ambulatory Visit: Payer: Self-pay
